# Patient Record
Sex: Male | Born: 1935 | Race: White | Hispanic: No | Marital: Married | Smoking: Former smoker
Health system: Southern US, Community
[De-identification: ages and names within clinical notes are randomized; demographics above are authoritative.]

## PROBLEM LIST (undated history)

## (undated) DIAGNOSIS — G8929 Other chronic pain: Secondary | ICD-10-CM

## (undated) DIAGNOSIS — M549 Dorsalgia, unspecified: Secondary | ICD-10-CM

## (undated) DIAGNOSIS — M545 Low back pain, unspecified: Secondary | ICD-10-CM

## (undated) DIAGNOSIS — Z87442 Personal history of urinary calculi: Secondary | ICD-10-CM

## (undated) DIAGNOSIS — Z86010 Personal history of colonic polyps: Secondary | ICD-10-CM

## (undated) DIAGNOSIS — M5136 Other intervertebral disc degeneration, lumbar region: Secondary | ICD-10-CM

## (undated) DIAGNOSIS — K219 Gastro-esophageal reflux disease without esophagitis: Secondary | ICD-10-CM

## (undated) DIAGNOSIS — N200 Calculus of kidney: Secondary | ICD-10-CM

## (undated) DIAGNOSIS — B1910 Unspecified viral hepatitis B without hepatic coma: Secondary | ICD-10-CM

## (undated) DIAGNOSIS — J189 Pneumonia, unspecified organism: Secondary | ICD-10-CM

## (undated) DIAGNOSIS — N390 Urinary tract infection, site not specified: Secondary | ICD-10-CM

## (undated) DIAGNOSIS — M2550 Pain in unspecified joint: Secondary | ICD-10-CM

## (undated) DIAGNOSIS — K8020 Calculus of gallbladder without cholecystitis without obstruction: Secondary | ICD-10-CM

## (undated) DIAGNOSIS — R197 Diarrhea, unspecified: Secondary | ICD-10-CM

## (undated) DIAGNOSIS — R32 Unspecified urinary incontinence: Secondary | ICD-10-CM

## (undated) DIAGNOSIS — C439 Malignant melanoma of skin, unspecified: Secondary | ICD-10-CM

## (undated) DIAGNOSIS — I739 Peripheral vascular disease, unspecified: Secondary | ICD-10-CM

## (undated) DIAGNOSIS — G4730 Sleep apnea, unspecified: Secondary | ICD-10-CM

## (undated) DIAGNOSIS — M2540 Effusion, unspecified joint: Secondary | ICD-10-CM

## (undated) DIAGNOSIS — N419 Inflammatory disease of prostate, unspecified: Secondary | ICD-10-CM

## (undated) DIAGNOSIS — K5792 Diverticulitis of intestine, part unspecified, without perforation or abscess without bleeding: Secondary | ICD-10-CM

## (undated) DIAGNOSIS — Z8619 Personal history of other infectious and parasitic diseases: Secondary | ICD-10-CM

## (undated) DIAGNOSIS — E785 Hyperlipidemia, unspecified: Secondary | ICD-10-CM

## (undated) DIAGNOSIS — G629 Polyneuropathy, unspecified: Secondary | ICD-10-CM

## (undated) DIAGNOSIS — I10 Essential (primary) hypertension: Secondary | ICD-10-CM

## (undated) DIAGNOSIS — H9319 Tinnitus, unspecified ear: Secondary | ICD-10-CM

## (undated) DIAGNOSIS — Z8719 Personal history of other diseases of the digestive system: Secondary | ICD-10-CM

## (undated) DIAGNOSIS — N400 Enlarged prostate without lower urinary tract symptoms: Secondary | ICD-10-CM

## (undated) DIAGNOSIS — M1990 Unspecified osteoarthritis, unspecified site: Secondary | ICD-10-CM

## (undated) DIAGNOSIS — N189 Chronic kidney disease, unspecified: Secondary | ICD-10-CM

## (undated) DIAGNOSIS — C4491 Basal cell carcinoma of skin, unspecified: Secondary | ICD-10-CM

## (undated) HISTORY — PX: OTHER SURGICAL HISTORY: SHX169

## (undated) HISTORY — DX: Calculus of gallbladder without cholecystitis without obstruction: K80.20

## (undated) HISTORY — DX: Sleep apnea, unspecified: G47.30

## (undated) HISTORY — DX: Unspecified viral hepatitis B without hepatic coma: B19.10

## (undated) HISTORY — DX: Low back pain, unspecified: M54.50

## (undated) HISTORY — DX: Unspecified urinary incontinence: R32

## (undated) HISTORY — DX: Low back pain: M54.5

## (undated) HISTORY — PX: BASAL CELL CARCINOMA EXCISION: SHX1214

## (undated) HISTORY — DX: Basal cell carcinoma of skin, unspecified: C44.91

## (undated) HISTORY — PX: EYE SURGERY: SHX253

## (undated) HISTORY — PX: WISDOM TOOTH EXTRACTION: SHX21

## (undated) HISTORY — DX: Personal history of other infectious and parasitic diseases: Z86.19

## (undated) HISTORY — DX: Calculus of kidney: N20.0

## (undated) HISTORY — DX: Urinary tract infection, site not specified: N39.0

---
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
## 1981-12-12 HISTORY — PX: VASECTOMY: SHX75

## 1984-12-12 HISTORY — PX: OTHER SURGICAL HISTORY: SHX169

## 1991-12-13 HISTORY — PX: CHOLECYSTECTOMY: SHX55

## 2002-04-25 ENCOUNTER — Ambulatory Visit (HOSPITAL_COMMUNITY): Admission: RE | Admit: 2002-04-25 | Discharge: 2002-04-25 | Payer: Self-pay | Admitting: Gastroenterology

## 2003-12-01 ENCOUNTER — Ambulatory Visit (HOSPITAL_BASED_OUTPATIENT_CLINIC_OR_DEPARTMENT_OTHER): Admission: RE | Admit: 2003-12-01 | Discharge: 2003-12-01 | Payer: Self-pay | Admitting: Urology

## 2003-12-01 ENCOUNTER — Observation Stay (HOSPITAL_COMMUNITY): Admission: EM | Admit: 2003-12-01 | Discharge: 2003-12-02 | Payer: Self-pay | Admitting: Urology

## 2003-12-01 ENCOUNTER — Encounter (INDEPENDENT_AMBULATORY_CARE_PROVIDER_SITE_OTHER): Payer: Self-pay | Admitting: Specialist

## 2004-12-12 HISTORY — PX: TRANSURETHRAL RESECTION OF PROSTATE: SHX73

## 2006-05-11 ENCOUNTER — Encounter: Admission: RE | Admit: 2006-05-11 | Discharge: 2006-05-11 | Payer: Self-pay | Admitting: Orthopedic Surgery

## 2009-10-12 HISTORY — PX: SHOULDER ARTHROSCOPY W/ ROTATOR CUFF REPAIR: SHX2400

## 2009-12-12 ENCOUNTER — Emergency Department (HOSPITAL_BASED_OUTPATIENT_CLINIC_OR_DEPARTMENT_OTHER)
Admission: EM | Admit: 2009-12-12 | Discharge: 2009-12-12 | Payer: Self-pay | Source: Home / Self Care | Admitting: Emergency Medicine

## 2009-12-12 HISTORY — PX: COLONOSCOPY: SHX174

## 2010-12-12 HISTORY — PX: OTHER SURGICAL HISTORY: SHX169

## 2011-04-29 NOTE — Op Note (Signed)
NAME:  Matthew Nash, Matthew Nash                          ACCOUNT NO.:  [id number]   MEDICAL RECORD NO.:  [id number]                   PATIENT TYPE:  AMB   LOCATION:  [facility]                                 FACILITY:  [facility]   PHYSICIAN:  Sigmund I. Patsi Sears, M.D.         DATE OF BIRTH:  [birth year]   DATE OF PROCEDURE:  12/01/2003  DATE OF DISCHARGE:                                 OPERATIVE REPORT   PREOPERATIVE DIAGNOSIS:  Benign prostatic hypertrophy.   POSTOPERATIVE DIAGNOSIS:  Benign prostatic hypertrophy.   OPERATION/PROCEDURE:  Transurethral resection of the prostate.   SURGEON:  Sigmund I. Patsi Sears, M.D.   ANESTHESIA:  General LMA.   PREPARATION:  After appropriate preanesthesia, the patient was brought to  the operating room and placed on the operating table in a dorsal supine  position where general LMA anesthesia was introduced.  He was then replaced  in the dorsal lithotomy position where his pubis was prepped with Betadine  solution and draped in the usual fashion.   REVIEW OF HISTORY:  This 75 year old male has crescendo BPH with a symptoms  course.  He had 22/7, despite doxazosin, and sol palmetto.  He is now for  TURP.   DESCRIPTION OF PROCEDURE:  Cystoscopy revealed trilobar BPH with an elevated  bladder neck.  There was no evidence of bladder stone or tumor, but there  was trabeculation and cellular formation.  There was no bladder stone or  tumor noted.   Resection was accomplished from the 5 o'clock to 7 o'clock positions, and  then from the 2 o'clock to the 5 o'clock positions, and from the 10 o'clock  back to the 6 o'clock position.  Chips were evacuated free from the bladder  without difficulty.  The patient tolerated the procedure well.  A size 24  catheter was placed in the bladder.  Because of large bloody resection, it  was elected to keep the patient for a 23-hour observation.  The patient was  awakened and taken to the recovery room in good  condition.                                               Sigmund I. Patsi Sears, M.D.    SIT/MEDQ  D:  12/01/2003  T:  12/01/2003  Job:  [id number]

## 2011-04-29 NOTE — Procedures (Signed)
[facility]  Patient:    Matthew Nash, Matthew Nash Visit Number: [id number] MRN: [id number]          Service Type: END Location: ENDO Attending Physician:  Charna Elizabeth Dictated by:   Anselmo Rod, M.D. Proc. Date: 04/25/02 Admit Date:  04/25/2002 Discharge Date: 04/25/2002                             Procedure Report  DATE OF BIRTH:  [birth year]  REFERRING PHYSICIAN:  PROCEDURE PERFORMED:  Colonoscopy.  ENDOSCOPIST:  Anselmo Rod, M.D.  INSTRUMENT USED:  Olympus video colonoscope.  INDICATIONS FOR PROCEDURE:  The patient is a 75 year old white male with a history of colonic polyps, rule out recurrent polyps.  PREPROCEDURE PREPARATION:  Informed consent was procured from the patient. The patient was fasted for eight hours prior to the procedure and prepped with a bottle of magnesium citrate and a gallon of NuLytely the night prior to the procedure.  PREPROCEDURE PHYSICAL:  The patient had stable vital signs.  Neck supple. Chest clear to auscultation.  S1, S2 regular.  Abdomen soft with normal bowel sounds.  DESCRIPTION OF PROCEDURE:  The patient was placed in the left lateral decubitus position and sedated with 100 mg of Demerol and 10 mg of Versed intravenously.  Once the patient was adequately sedated and maintained on low-flow oxygen and continuous cardiac monitoring, the Olympus video colonoscope was advanced from the rectum to the cecum without difficulty.  The patient had evidence of pandiverticular disease with more prominent changes in the left colon.  No masses or polyps were seen.  The procedure was complete up to the cecum.  The appendiceal orifice and ileocecal valve were clearly visualized and photographed.  IMPRESSION:  Pandiverticulosis, no masses or polyps seen.  RECOMMENDATIONS: 1. A high fiber diet has been discussed with the patient in great detail and    brochures have been given to him for his education. 2.  Repeat colorectal cancer screening is recommended in the next five years    unless the patient develops any abnormal symptoms in the interim. 3. Outpatient follow-up on a p.r.n. basis.Dictated by:   Anselmo Rod, M.D.  Attending Physician:  Charna Elizabeth DD:  05/05/02 TD:  05/07/02 Job: [id number]

## 2011-10-26 ENCOUNTER — Encounter (HOSPITAL_BASED_OUTPATIENT_CLINIC_OR_DEPARTMENT_OTHER): Payer: Self-pay | Admitting: *Deleted

## 2011-10-26 NOTE — Progress Notes (Signed)
NPO after MN. Pt to arrive at 0830. Needs istat and EKG. Will take prilosec am of surg. W/ sip of water. Reviewed RCC guidelines, will bring meds.

## 2011-10-28 ENCOUNTER — Encounter (HOSPITAL_BASED_OUTPATIENT_CLINIC_OR_DEPARTMENT_OTHER): Payer: Self-pay | Admitting: Anesthesiology

## 2011-10-28 ENCOUNTER — Ambulatory Visit (HOSPITAL_BASED_OUTPATIENT_CLINIC_OR_DEPARTMENT_OTHER)
Admission: RE | Admit: 2011-10-28 | Discharge: 2011-10-29 | Disposition: A | Discharge status: Home / Self Care | Payer: Medicare Other | Source: Ambulatory Visit | Attending: Orthopedic Surgery | Admitting: Orthopedic Surgery

## 2011-10-28 ENCOUNTER — Encounter (HOSPITAL_BASED_OUTPATIENT_CLINIC_OR_DEPARTMENT_OTHER)
Admission: RE | Disposition: A | Discharge status: Home / Self Care | Payer: Self-pay | Source: Ambulatory Visit | Attending: Orthopedic Surgery

## 2011-10-28 ENCOUNTER — Other Ambulatory Visit: Payer: Self-pay

## 2011-10-28 ENCOUNTER — Ambulatory Visit (HOSPITAL_BASED_OUTPATIENT_CLINIC_OR_DEPARTMENT_OTHER): Payer: Medicare Other | Admitting: Anesthesiology

## 2011-10-28 DIAGNOSIS — K219 Gastro-esophageal reflux disease without esophagitis: Secondary | ICD-10-CM | POA: Insufficient documentation

## 2011-10-28 DIAGNOSIS — N400 Enlarged prostate without lower urinary tract symptoms: Secondary | ICD-10-CM | POA: Insufficient documentation

## 2011-10-28 DIAGNOSIS — M75100 Unspecified rotator cuff tear or rupture of unspecified shoulder, not specified as traumatic: Secondary | ICD-10-CM

## 2011-10-28 DIAGNOSIS — X58XXXA Exposure to other specified factors, initial encounter: Secondary | ICD-10-CM | POA: Insufficient documentation

## 2011-10-28 DIAGNOSIS — M19019 Primary osteoarthritis, unspecified shoulder: Secondary | ICD-10-CM | POA: Insufficient documentation

## 2011-10-28 DIAGNOSIS — Z7982 Long term (current) use of aspirin: Secondary | ICD-10-CM | POA: Insufficient documentation

## 2011-10-28 DIAGNOSIS — I10 Essential (primary) hypertension: Secondary | ICD-10-CM | POA: Insufficient documentation

## 2011-10-28 DIAGNOSIS — S43429A Sprain of unspecified rotator cuff capsule, initial encounter: Secondary | ICD-10-CM | POA: Insufficient documentation

## 2011-10-28 DIAGNOSIS — Z79899 Other long term (current) drug therapy: Secondary | ICD-10-CM | POA: Insufficient documentation

## 2011-10-28 DIAGNOSIS — M24119 Other articular cartilage disorders, unspecified shoulder: Secondary | ICD-10-CM | POA: Insufficient documentation

## 2011-10-28 DIAGNOSIS — E119 Type 2 diabetes mellitus without complications: Secondary | ICD-10-CM | POA: Insufficient documentation

## 2011-10-28 DIAGNOSIS — M25819 Other specified joint disorders, unspecified shoulder: Secondary | ICD-10-CM | POA: Insufficient documentation

## 2011-10-28 HISTORY — DX: Benign prostatic hyperplasia without lower urinary tract symptoms: N40.0

## 2011-10-28 HISTORY — DX: Essential (primary) hypertension: I10

## 2011-10-28 HISTORY — DX: Gastro-esophageal reflux disease without esophagitis: K21.9

## 2011-10-28 HISTORY — DX: Unspecified osteoarthritis, unspecified site: M19.90

## 2011-10-28 LAB — POCT I-STAT 4, (NA,K, GLUC, HGB,HCT)
Glucose, Bld: 302 mg/dL — ABNORMAL HIGH (ref 70–99)
HCT: 49 % (ref 39.0–52.0)
Potassium: 4.2 mEq/L (ref 3.5–5.1)
Sodium: 142 mEq/L (ref 135–145)

## 2011-10-28 LAB — GLUCOSE, CAPILLARY: Glucose-Capillary: 278 mg/dL — ABNORMAL HIGH (ref 70–99)

## 2011-10-28 SURGERY — SHOULDER ARTHROSCOPY WITH OPEN ROTATOR CUFF REPAIR AND DISTAL CLAVICLE ACROMINECTOMY
Anesthesia: General | Site: Shoulder | Laterality: Right

## 2011-10-28 MED ORDER — ACETAMINOPHEN 650 MG RE SUPP: 650.0000 mg | Freq: Four times a day (QID) | RECTAL | Status: DC | PRN

## 2011-10-28 MED ORDER — METOCLOPRAMIDE HCL 5 MG/ML IJ SOLN: 5.0000 mg | Freq: Three times a day (TID) | INTRAMUSCULAR | Status: DC | PRN

## 2011-10-28 MED ORDER — PROMETHAZINE HCL 25 MG/ML IJ SOLN: 12.5000 mg | Freq: Once | INTRAMUSCULAR | Status: DC

## 2011-10-28 MED ORDER — ROPIVACAINE HCL 5 MG/ML IJ SOLN
INTRAMUSCULAR | Status: DC | PRN
  Administered 2011-10-28: 25 mL via EPIDURAL

## 2011-10-28 MED ORDER — SODIUM CHLORIDE 0.9 % IV SOLN: INTRAVENOUS | Status: DC

## 2011-10-28 MED ORDER — CELECOXIB 200 MG PO CAPS: 200.0000 mg | ORAL_CAPSULE | Freq: Two times a day (BID) | ORAL | Status: DC

## 2011-10-28 MED ORDER — DEXTROSE 5 % IV SOLN: 500.0000 mg | Freq: Four times a day (QID) | INTRAVENOUS | Status: DC | PRN

## 2011-10-28 MED ORDER — ACETAMINOPHEN 325 MG PO TABS: 650.0000 mg | ORAL_TABLET | Freq: Four times a day (QID) | ORAL | Status: DC | PRN

## 2011-10-28 MED ORDER — PROMETHAZINE HCL 25 MG/ML IJ SOLN: 6.2500 mg | INTRAMUSCULAR | Status: DC | PRN

## 2011-10-28 MED ORDER — VANCOMYCIN HCL IN DEXTROSE 1-5 GM/200ML-% IV SOLN: 1000.0000 mg | Freq: Two times a day (BID) | INTRAVENOUS | Status: DC

## 2011-10-28 MED ORDER — ONDANSETRON HCL 4 MG/2ML IJ SOLN
INTRAMUSCULAR | Status: DC | PRN
  Administered 2011-10-28: 4 mg via INTRAVENOUS

## 2011-10-28 MED ORDER — INSULIN ASPART 100 UNIT/ML ~~LOC~~ SOLN
0.0000 [IU] | SUBCUTANEOUS | Status: DC
  Administered 2011-10-28: 8 [IU] via SUBCUTANEOUS
  Administered 2011-10-28: 11 [IU] via SUBCUTANEOUS

## 2011-10-28 MED ORDER — SIMVASTATIN 10 MG PO TABS
10.0000 mg | ORAL_TABLET | Freq: Every day | ORAL | Status: DC
Start: 2011-10-28 — End: 2011-10-29

## 2011-10-28 MED ORDER — LACTATED RINGERS IV SOLN
INTRAVENOUS | Status: DC
  Administered 2011-10-28 (×2): via INTRAVENOUS

## 2011-10-28 MED ORDER — FENTANYL CITRATE 0.05 MG/ML IJ SOLN: 25.0000 ug | INTRAMUSCULAR | Status: DC | PRN

## 2011-10-28 MED ORDER — METOCLOPRAMIDE HCL 5 MG PO TABS: 5.0000 mg | ORAL_TABLET | Freq: Three times a day (TID) | ORAL | Status: DC | PRN

## 2011-10-28 MED ORDER — PANTOPRAZOLE SODIUM 20 MG PO TBEC: 20.0000 mg | DELAYED_RELEASE_TABLET | Freq: Every day | ORAL | Status: DC

## 2011-10-28 MED ORDER — MIDAZOLAM HCL 5 MG/5ML IJ SOLN
INTRAMUSCULAR | Status: DC | PRN
  Administered 2011-10-28: 1 mg via INTRAVENOUS

## 2011-10-28 MED ORDER — PROMETHAZINE HCL 25 MG PO TABS: 25.0000 mg | ORAL_TABLET | Freq: Four times a day (QID) | ORAL | Status: DC | PRN

## 2011-10-28 MED ORDER — OXYCODONE-ACETAMINOPHEN 5-325 MG PO TABS
1.0000 | ORAL_TABLET | ORAL | Status: DC | PRN
  Administered 2011-10-28: 2 via ORAL

## 2011-10-28 MED ORDER — ASPIRIN 81 MG PO TABS: 81.0000 mg | ORAL_TABLET | Freq: Every day | ORAL | Status: DC

## 2011-10-28 MED ORDER — PHENOL 1.4 % MT LIQD: 1.0000 | OROMUCOSAL | Status: DC | PRN

## 2011-10-28 MED ORDER — ONDANSETRON HCL 4 MG PO TABS: 4.0000 mg | ORAL_TABLET | Freq: Four times a day (QID) | ORAL | Status: DC | PRN

## 2011-10-28 MED ORDER — SUCCINYLCHOLINE CHLORIDE 20 MG/ML IJ SOLN
INTRAMUSCULAR | Status: DC | PRN
  Administered 2011-10-28: 140 mg via INTRAVENOUS

## 2011-10-28 MED ORDER — SODIUM CHLORIDE 0.9 % IR SOLN
Status: DC | PRN
  Administered 2011-10-28: 10:00:00

## 2011-10-28 MED ORDER — POVIDONE-IODINE 7.5 % EX SOLN: Freq: Once | CUTANEOUS | Status: DC

## 2011-10-28 MED ORDER — MENTHOL 3 MG MT LOZG: 1.0000 | LOZENGE | OROMUCOSAL | Status: DC | PRN

## 2011-10-28 MED ORDER — MIDAZOLAM HCL 2 MG/2ML IJ SOLN
2.0000 mg | Freq: Once | INTRAMUSCULAR | Status: AC
  Administered 2011-10-28: 2 mg via INTRAVENOUS

## 2011-10-28 MED ORDER — LIDOCAINE HCL (CARDIAC) 20 MG/ML IV SOLN
INTRAVENOUS | Status: DC | PRN
  Administered 2011-10-28: 50 mg via INTRAVENOUS

## 2011-10-28 MED ORDER — PROPOFOL 10 MG/ML IV EMUL
INTRAVENOUS | Status: DC | PRN
  Administered 2011-10-28: 200 mg via INTRAVENOUS

## 2011-10-28 MED ORDER — DOCUSATE SODIUM 100 MG PO CAPS: 100.0000 mg | ORAL_CAPSULE | Freq: Two times a day (BID) | ORAL | Status: DC

## 2011-10-28 MED ORDER — METFORMIN HCL 500 MG PO TABS: 500.0000 mg | ORAL_TABLET | Freq: Two times a day (BID) | ORAL | Status: DC

## 2011-10-28 MED ORDER — BUPIVACAINE-EPINEPHRINE 0.5% -1:200000 IJ SOLN
INTRAMUSCULAR | Status: DC | PRN
  Administered 2011-10-28: 5 mL

## 2011-10-28 MED ORDER — FENTANYL CITRATE 0.05 MG/ML IJ SOLN
INTRAMUSCULAR | Status: DC | PRN
  Administered 2011-10-28: 25 ug via INTRAVENOUS
  Administered 2011-10-28: 100 ug via INTRAVENOUS
  Administered 2011-10-28 (×3): 25 ug via INTRAVENOUS

## 2011-10-28 MED ORDER — METHOCARBAMOL 500 MG PO TABS
500.0000 mg | ORAL_TABLET | Freq: Four times a day (QID) | ORAL | Status: DC | PRN
  Administered 2011-10-28: 500 mg via ORAL

## 2011-10-28 MED ORDER — VANCOMYCIN HCL IN DEXTROSE 1-5 GM/200ML-% IV SOLN
1000.0000 mg | Freq: Two times a day (BID) | INTRAVENOUS | Status: DC
  Administered 2011-10-28: 1000 mg via INTRAVENOUS

## 2011-10-28 MED ORDER — LISINOPRIL 10 MG PO TABS: 10.0000 mg | ORAL_TABLET | Freq: Every evening | ORAL | Status: DC

## 2011-10-28 MED ORDER — ONDANSETRON HCL 4 MG/2ML IJ SOLN: 4.0000 mg | Freq: Four times a day (QID) | INTRAMUSCULAR | Status: DC | PRN

## 2011-10-28 MED ORDER — CEFAZOLIN SODIUM-DEXTROSE 2-3 GM-% IV SOLR: 2.0000 g | Freq: Once | INTRAVENOUS | Status: DC

## 2011-10-28 MED ORDER — CALCIUM CARBONATE-VITAMIN D 250-125 MG-UNIT PO TABS: 1.0000 | ORAL_TABLET | Freq: Two times a day (BID) | ORAL | Status: DC

## 2011-10-28 SURGICAL SUPPLY — 70 items
ANCH SUT 2 5.5 BABSR ASCP (Orthopedic Implant) ×1 IMPLANT
ANCHOR PEEK ZIP 5.5 NDL NO2 (Orthopedic Implant) ×1 IMPLANT
APL SKNCLS STERI-STRIP NONHPOA (GAUZE/BANDAGES/DRESSINGS) ×1
BENZOIN TINCTURE PRP APPL 2/3 (GAUZE/BANDAGES/DRESSINGS) ×2 IMPLANT
BLADE 4.2CUDA (BLADE) ×2 IMPLANT
BLADE CUDA 4.2 (BLADE) IMPLANT
BLADE CUDA 5.5 (BLADE) IMPLANT
BLADE CUDA SHAVER 3.5 (BLADE) IMPLANT
BLADE CUTTER GATOR 3.5 (BLADE) IMPLANT
BLADE FLAT COURSE (BLADE) IMPLANT
BLADE GREAT WHITE 4.2 (BLADE) IMPLANT
BUR OVAL 4.0 (BURR) ×1 IMPLANT
CANISTER SUCT LVC 12 LTR MEDI- (MISCELLANEOUS) ×3 IMPLANT
CANISTER SUCTION 1200CC (MISCELLANEOUS) ×3 IMPLANT
CANISTER SUCTION 2500CC (MISCELLANEOUS) ×2 IMPLANT
CLOTH BEACON ORANGE TIMEOUT ST (SAFETY) ×2 IMPLANT
DRAPE LG THREE QUARTER DISP (DRAPES) ×3 IMPLANT
DRAPE SHOULDER BEACH CHAIR (DRAPES) ×2 IMPLANT
DRAPE U-SHAPE 47X51 STRL (DRAPES) ×2 IMPLANT
DRSG ADAPTIC 3X8 NADH LF (GAUZE/BANDAGES/DRESSINGS) ×2 IMPLANT
DRSG PAD ABDOMINAL 8X10 ST (GAUZE/BANDAGES/DRESSINGS) ×2 IMPLANT
DURAPREP 26ML APPLICATOR (WOUND CARE) ×2 IMPLANT
ELECT MENISCUS 165MM 90D (ELECTRODE) IMPLANT
ELECT REM PT RETURN 9FT ADLT (ELECTROSURGICAL) ×2
ELECTRODE REM PT RTRN 9FT ADLT (ELECTROSURGICAL) ×1 IMPLANT
GAUZE SPONGE 4X4 12PLY STRL LF (GAUZE/BANDAGES/DRESSINGS) ×1 IMPLANT
GLOVE ECLIPSE 6.5 STRL STRAW (GLOVE) ×1 IMPLANT
GLOVE ECLIPSE 8.0 STRL XLNG CF (GLOVE) ×4 IMPLANT
GLOVE INDICATOR 6.5 STRL GRN (GLOVE) ×1 IMPLANT
GLOVE INDICATOR 8.0 STRL GRN (GLOVE) ×3 IMPLANT
GOWN PREVENTION PLUS LG XLONG (DISPOSABLE) ×2 IMPLANT
GOWN STRL REIN XL XLG (GOWN DISPOSABLE) ×4 IMPLANT
IV NS IRRIG 3000ML ARTHROMATIC (IV SOLUTION) ×2 IMPLANT
NDL 1/2 CIR CATGUT .05X1.09 (NEEDLE) IMPLANT
NDL HYPO 18GX1.5 BLUNT FILL (NEEDLE) ×1 IMPLANT
NDL SAFETY ECLIPSE 18X1.5 (NEEDLE) ×1 IMPLANT
NEEDLE 1/2 CIR CATGUT .05X1.09 (NEEDLE) IMPLANT
NEEDLE HYPO 18GX1.5 BLUNT FILL (NEEDLE) ×2 IMPLANT
NEEDLE HYPO 18GX1.5 SHARP (NEEDLE) ×2
NS IRRIG 500ML POUR BTL (IV SOLUTION) ×2 IMPLANT
PACK ARTHROSCOPY DSU (CUSTOM PROCEDURE TRAY) ×2 IMPLANT
PACK BASIN DAY SURGERY FS (CUSTOM PROCEDURE TRAY) ×2 IMPLANT
PENCIL BUTTON HOLSTER BLD 10FT (ELECTRODE) ×1 IMPLANT
SET ARTHROSCOPY TUBING (MISCELLANEOUS) ×2
SET ARTHROSCOPY TUBING LN (MISCELLANEOUS) ×1 IMPLANT
SLING ARM IMMOBILIZER LRG (SOFTGOODS) IMPLANT
SLING ARM IMMOBILIZER XL (CAST SUPPLIES) ×1 IMPLANT
SPONGE GAUZE 4X4 12PLY (GAUZE/BANDAGES/DRESSINGS) ×2 IMPLANT
SPONGE LAP 4X18 X RAY DECT (DISPOSABLE) ×1 IMPLANT
SPONGE SURGIFOAM ABS GEL 100 (HEMOSTASIS) ×1 IMPLANT
STAPLER VISISTAT 35W (STAPLE) ×1 IMPLANT
STRIP CLOSURE SKIN 1/2X4 (GAUZE/BANDAGES/DRESSINGS) ×2 IMPLANT
SUCTION FRAZIER TIP 10 FR DISP (SUCTIONS) ×2 IMPLANT
SUT BONE WAX W31G (SUTURE) ×1 IMPLANT
SUT ETHIBOND GREEN BRAID 0S 4 (SUTURE) IMPLANT
SUT ETHIBOND NAB CT1 #1 30IN (SUTURE) IMPLANT
SUT ETHILON 4 0 PS 2 18 (SUTURE) ×3 IMPLANT
SUT VIC AB 0 CT1 36 (SUTURE) IMPLANT
SUT VIC AB 1 CT1 36 (SUTURE) ×4 IMPLANT
SUT VIC AB 2-0 CT1 27 (SUTURE) ×4
SUT VIC AB 2-0 CT1 TAPERPNT 27 (SUTURE) ×2 IMPLANT
SUT VIC AB 3-0 SH 27 (SUTURE)
SUT VIC AB 3-0 SH 27X BRD (SUTURE) IMPLANT
SYR BULB IRRIGATION 50ML (SYRINGE) ×2 IMPLANT
SYRINGE 10CC LL (SYRINGE) ×2 IMPLANT
TAPE HYPAFIX 6X30 (GAUZE/BANDAGES/DRESSINGS) ×1 IMPLANT
TOWEL OR 17X24 6PK STRL BLUE (TOWEL DISPOSABLE) ×3 IMPLANT
TUBE CONNECTING 12X1/4 (SUCTIONS) ×2 IMPLANT
WAND 90 DEG TURBOVAC W/CORD (SURGICAL WAND) ×2 IMPLANT
WATER STERILE IRR 500ML POUR (IV SOLUTION) ×2 IMPLANT

## 2011-10-28 NOTE — H&P (Signed)
Matthew Nash is an 75 y.o. male.   Chief Complaint: painful rt shoulder HPI:   Past Medical History  Diagnosis Date  . BPH (benign prostatic hyperplasia)     hx s/p turp  . Hypertension     stress test 10 yrs ago  . Diabetes mellitus     oral meds  . Arthritis     left knee and right shoulder  . Right rotator cuff tear   . GERD (gastroesophageal reflux disease)     controlled w/ prilosec    Past Surgical History  Procedure Date  . Cholecystectomy 1993  . T7 - l1  bone chip removed 1979  . Shoulder arthroscopy w/ rotator cuff repair NOV 2010    LEFT  . Tonsillectomy AGE 39  . Transurethral resection of prostate 2004    History reviewed. No pertinent family history. Social History:  reports that he quit smoking about 33 years ago. He has never used smokeless tobacco. He reports that he drinks alcohol. He reports that he does not use illicit drugs.  Allergies:  Allergies  Allergen Reactions  . Penicillins Rash    Medications Prior to Admission  Medication Dose Route Frequency Provider Last Rate Last Dose  . 1 mL EPINEPHrine 1 mg/mL (1:1000) in 0.9% Normal Saline 3000 mL irrigation    PRN James P Aplington      . bupivacaine-EPINEPHrine (MARCAINE W/ EPI) 0.5 % (with pres) injection    PRN James P Aplington   20 mL at 10/28/11 1009  . fentaNYL (SUBLIMAZE) injection 25-50 mcg  25-50 mcg Intravenous Q5 min PRN Jill Side, MD      . insulin aspart (novoLOG) injection 0-15 Units  0-15 Units Subcutaneous Q4H Jill Side, MD      . lactated ringers infusion   Intravenous Continuous Jill Side, MD 50 mL/hr at 10/28/11 1014    . midazolam (VERSED) injection 2 mg  2 mg Intravenous Once Jill Side, MD   2 mg at 10/28/11 1024  . povidone-iodine (BETADINE) 7.5 % scrub   Topical Once James P Aplington      . promethazine (PHENERGAN) injection 6.25-12.5 mg  6.25-12.5 mg Intravenous Q15 min PRN Jill Side, MD      . vancomycin (VANCOCIN) IVPB 1000  mg/200 mL premix  1,000 mg Intravenous Q12H James P Aplington      . DISCONTD: ceFAZolin (ANCEF) IVPB 2 g/50 mL premix  2 g Intravenous Once James P Aplington       Medications Prior to Admission  Medication Sig Dispense Refill  . aspirin 81 MG tablet Take 81 mg by mouth daily.        . calcium-vitamin D (OSCAL WITH D) 250-125 MG-UNIT per tablet Take 1 tablet by mouth 2 (two) times daily.        . celecoxib (CELEBREX) 200 MG capsule Take 200 mg by mouth.        . FIBER FORMULA PO Take 930 mg by mouth 2 (two) times daily. x3 capsules bid       . lisinopril (PRINIVIL,ZESTRIL) 10 MG tablet Take 10 mg by mouth every evening.        . metFORMIN (GLUCOPHAGE) 500 MG tablet Take 500 mg by mouth 2 (two) times daily with a meal.        . Multiple Vitamin (MULTIVITAMIN) tablet Take 1 tablet by mouth daily.        [name] Kitchen omeprazole (PRILOSEC) 20 MG capsule Take 20 mg by  mouth every morning.        . Probiotic Product (PROBIOTIC FORMULA PO) Take 300 mg by mouth every evening.        . simvastatin (ZOCOR) 10 MG tablet Take 10 mg by mouth at bedtime.        . vitamin C (ASCORBIC ACID) 500 MG tablet Take 500 mg by mouth daily.          Results for orders placed during the hospital encounter of 10/28/11 (from the past 48 hour(s))  POCT I-STAT 4, (NA,K, GLUC, HGB,HCT)     Status: Abnormal   Collection Time   10/28/11 10:09 AM      Component Value Range Comment   Sodium 142  135 - 145 (mEq/L)    Potassium 4.2  3.5 - 5.1 (mEq/L)    Glucose, Bld 302 (*) 70 - 99 (mg/dL)    HCT 11.9  14.7 - 82.9 (%)    Hemoglobin 16.7  13.0 - 17.0 (g/dL)    No results found.  ROS  Blood pressure 131/80, pulse 77, temperature 97.3 F (36.3 C), temperature source Oral, resp. rate 20, height 5\' 11"  (1.803 m), weight 114.76 kg (253 lb), SpO2 94.00%. Physical Exam  Constitutional: He is oriented to person, place, and time. He appears well-developed.  HENT:  Head: Normocephalic.  Right Ear: External ear normal.  Eyes:  Conjunctivae and EOM are normal. Pupils are equal, round, and reactive to light.  Neck: Normal range of motion. Neck supple.  Cardiovascular: Normal rate, regular rhythm, normal heart sounds and intact distal pulses.   Respiratory: Effort normal and breath sounds normal.  GI: Soft. Bowel sounds are normal.  Musculoskeletal:       He has had an interscalene block  Neurological: He is alert and oriented to person, place, and time.  Skin: Skin is dry.  Psychiatric: He has a normal mood and affect. His behavior is normal. Judgment and thought content normal.     Assessment/Plan Rt shoulder--labral tear,rotator cuff tear,acarthitis  Rt shoulder arthroscopy with labral debridement, open distal clavicle resection, rotator cuff repair  APLINGTON,JAMES P 10/28/2011, 10:30 AM

## 2011-10-28 NOTE — Anesthesia Postprocedure Evaluation (Signed)
  Anesthesia Post-op Note  Patient: Matthew Nash  Procedure(s) Performed:  SHOULDER ARTHROSCOPY WITH OPEN ROTATOR CUFF REPAIR AND DISTAL CLAVICLE ACROMINECTOMY - RIGHT SHOULDER ARTHROSCOPY WITH LABRAL DEBRIDEMENT AND OPEN ANTERIOR ACROMINECTOMY WITH ROTATOR CUFF REPAIR OWER DIABETIC GENERAL ANESTHESIA WITH BLOCK ALLEN OR SCHENE FRAME  Patient Location: PACU  Anesthesia Type: General and GA combined with regional for post-op pain  Level of Consciousness: oriented and sedated  Airway and Oxygen Therapy: Patient Spontanous Breathing and Patient connected to nasal cannula oxygen  Post-op Pain: none  Post-op Assessment: Post-op Vital signs reviewed, Patient's Cardiovascular Status Stable, Respiratory Function Stable and Patent Airway  Post-op Vital Signs: stable  Complications: No apparent anesthesia complications

## 2011-10-28 NOTE — Anesthesia Procedure Notes (Addendum)
Anesthesia Regional Block:  Interscalene brachial plexus block  Pre-Anesthetic Checklist: ,, timeout performed, Correct Patient, Correct Site, Correct Laterality, Correct Procedure, Correct Position, site marked, Risks and benefits discussed,  Surgical consent,  Pre-op evaluation,  At surgeon's request and post-op pain management  Laterality: Right  Prep: Betadine       Needles:  Injection technique: Single-shot  Needle Type: Stimulator Needle - 40      Needle Gauge: 22 and 22 G  Needle insertion depth: 3 cm   Additional Needles:  Procedures: nerve stimulator Interscalene brachial plexus block  Nerve Stimulator or Paresthesia:  Response: 0.5 mA,   Additional Responses:   Narrative:  Start time: 10/28/2011 10:20 AM End time: 10/28/2011 10:30 AM  Performed by: Personally  Anesthesiologist: Shireen Quan  Additional Notes: Patient tolerated the procedure well without complications  Interscalene brachial plexus block Performed by: Zenia Resides D    Procedure Name: Intubation Date/Time: 10/28/2011 10:50 AM Performed by: Zenia Resides D Pre-anesthesia Checklist: Patient identified, Emergency Drugs available, Suction available and Patient being monitored Patient Re-evaluated:Patient Re-evaluated prior to inductionOxygen Delivery Method: Circle System Utilized Preoxygenation: Pre-oxygenation with 100% oxygen Intubation Type: IV induction and Circoid Pressure applied Ventilation: Mask ventilation without difficulty and Oral airway inserted - appropriate to patient size Laryngoscope Size: Mac and 3 Grade View: Grade I Tube type: Oral Number of attempts: 1 Airway Equipment and Method: stylet and oral airway Placement Confirmation: ETT inserted through vocal cords under direct vision,  positive ETCO2 and breath sounds checked- equal and bilateral Secured at: 23 cm Tube secured with: Tape Dental Injury: Teeth and Oropharynx as per pre-operative assessment

## 2011-10-28 NOTE — Anesthesia Preprocedure Evaluation (Addendum)
Anesthesia Evaluation  Patient identified by MRN, date of birth, ID band Patient awake    Reviewed: Allergy & Precautions, H&P , NPO status , Patient's Chart, lab work & pertinent test results, reviewed documented beta blocker date and time   Airway Mallampati: II  Neck ROM: Full    Dental   Pulmonary neg pulmonary ROS,  clear to auscultation        Cardiovascular hypertension, Pt. on medications Regular Normal Denies cardiac symptoms   Neuro/Psych Negative Neurological ROS  Negative Psych ROS   GI/Hepatic negative GI ROS, Neg liver ROS,   Endo/Other  Diabetes mellitus-, Type 2, Oral Hypoglycemic Agents  Renal/GU negative Renal ROS  Genitourinary negative   Musculoskeletal negative musculoskeletal ROS (+)   Abdominal   Peds negative pediatric ROS (+)  Hematology negative hematology ROS (+)   Anesthesia Other Findings   Reproductive/Obstetrics negative OB ROS                           Anesthesia Physical Anesthesia Plan  ASA: III  Anesthesia Plan: General   Post-op Pain Management:    Induction: Intravenous  Airway Management Planned: Oral ETT  Additional Equipment:   Intra-op Plan:   Post-operative Plan: Extubation in OR  Informed Consent: I have reviewed the patients History and Physical, chart, labs and discussed the procedure including the risks, benefits and alternatives for the proposed anesthesia with the patient or authorized representative who has indicated his/her understanding and acceptance.     Plan Discussed with: CRNA and Surgeon  Anesthesia Plan Comments:         Anesthesia Quick Evaluation

## 2011-10-28 NOTE — Transfer of Care (Signed)
Immediate Anesthesia Transfer of Care Note  Patient: Matthew Nash  Procedure(s) Performed:  SHOULDER ARTHROSCOPY WITH OPEN ROTATOR CUFF REPAIR AND DISTAL CLAVICLE ACROMINECTOMY - RIGHT SHOULDER ARTHROSCOPY WITH LABRAL DEBRIDEMENT AND OPEN ANTERIOR ACROMINECTOMY WITH ROTATOR CUFF REPAIR OWER DIABETIC GENERAL ANESTHESIA WITH BLOCK ALLEN OR SCHENE FRAME  Patient Location: PACU  Anesthesia Type: General  Level of Consciousness: awake and oriented  Airway & Oxygen Therapy: Patient Spontanous Breathing and Patient connected to face mask oxygen  Post-op Assessment: Report given to PACU RN  Post vital signs: stable  Complications: No apparent anesthesia complications

## 2011-10-28 NOTE — Brief Op Note (Signed)
10/28/2011  12:41 PM  PATIENT:  Matthew Matthew  75 y.o. male  PRE-OPERATIVE DIAGNOSIS:  RIGHT SHOULDER LABRAL TEAR AND ROTATOR CUFF TEAR  POST-OPERATIVE DIAGNOSIS:  RIGHT SHOULDER LABRAL TEAR AND ROTATOR CUFF  PROCEDURE:  Procedure(s): SHOULDER ARTHROSCOPY WITH OPEN ROTATOR CUFF REPAIR AND DISTAL CLAVICLE ACROMINECTOMY  SURGEON:  Surgeon(s): James P Aplington  PHYSICIAN ASSISTANT:   ASSISTANTS:Mr. Underwood PAC   ANESTHESIA:   regional and general  EBL:  Total I/O In: 1000 [I.V.:1000] Out: -   BLOOD ADMINISTERED:none  DRAINS: none   LOCAL MEDICATIONS USED:  MARCAINE 5 CC  SPECIMEN:  No Specimen  DISPOSITION OF SPECIMEN:  N/A  COUNTS:  YES  TOURNIQUET:  * No tourniquets in log *  DICTATION: .Other Dictation: Dictation Number [id number]  PLAN OF CARE: Admit for overnight observation  PATIENT DISPOSITION:  PACU - hemodynamically stable.   Delay start of Pharmacological VTE agent (>24hrs) due to surgical blood loss or risk of bleeding:  {YES/NO/NOT APPLICABLE:20182

## 2011-10-28 NOTE — Op Note (Signed)
Matthew Nash, BAIL NO.:  [id number]  MEDICAL RECORD NO.:  [id number]  LOCATION:                                 FACILITY:  PHYSICIAN:  Marlowe Kays, M.D.  DATE OF BIRTH:  11/10/1936  DATE OF PROCEDURE:  10/28/2011 DATE OF DISCHARGE:                              OPERATIVE REPORT   PREOPERATIVE DIAGNOSES: 1. Torn labrum. 2. Acromioclavicular joint arthritis. 3. Torn rotator cuff, right shoulder.  POSTOPERATIVE DIAGNOSES: 1. Torn labrum. 2. Acromioclavicular joint arthritis. 3. Torn rotator cuff, right shoulder.  OPERATION: 1. Right shoulder arthroscopy with debridement of labrum. 2. Open distal clavicle resection. 3. Open anterior acromionectomy with repair of torn rotator cuff.  SURGEON:  Marlowe Kays, M.D.  ASSISTANTDruscilla Brownie. Cherlynn June.  ANESTHESIA:  General preceded by interscalene block.  Mr. Angie Fava assistance was required to help hold the arm, move the arm, assist in retraction, and generally give the extra pair of hands needed to perform the case.  PROCEDURE:  Prophylactic antibiotics, satisfactory general anesthesia preceded by an interscalene block, beach chair position on the sliding frame, right shoulder girdle was prepped with DuraPrep, draped in sterile field.  The anatomy of the shoulder joint was marked out and I infiltrated the site for posterior portal and the surgical incision with Marcaine with adrenaline.  First through a posterior soft spot portal after time-out was performed, I easily entered the glenohumeral joint with the findings of the badly torn labrum.  There was some minor wear of the humeral head, also undersurface of the rotator cuff was visualized with tear fragments as well.  I advanced the arthroscope between the biceps tendon and subscapularis and using a switching stick, made an anterior incision over which I placed a metal cannula followed by 4.2 shaver entering the joint.  I debrided down  the labrum until clear.  Final pictures were taken.  I then removed fluid from the joint and made incision over the distal clavicle identifying the [facility] joint.  I marked off a centimeter and a half medial to the [facility] joint and undermined the clavicle at this point placing baby Bennett retractors.  I then used a micro saw to amputate the clavicle at this level.  I checked, there were no residual bone fragments.  The raw bone was covered with bone wax.  I then extended the incision lateralward and dissected fascia off the anterior acromion.  I placed a small Cobb elevator followed by large Cobb elevator beneath the anterior acromion.  He had a tight impingement problem.  My initial anterior acromionectomy was performed, and I followed this by removing additional bone with both saw and rongeur until we had a wide decompression.  I then inspected the rotator cuff tear.  Biceps tendon was intact.  Tear measured about 2.5 cm from front to back and about 2.5 to 3 cm from lateral to medial.  It was somewhat comminuted.  After denuding the articular cartilage of the humeral head next to the greater tuberosity, I used a 4 stranded 5.5 Stryker anchor and wove the 4 limbs through the rotator cuff.  Working first posteriorly, I tied down the posterior half of the  tear and then using the suture, made multiple side-to-side sutures repairing the comminution.  Then anteriorly, I tied down the anterior portion of the rotator cuff and also made a second line of suturing by taking the suture through the terminal portion of the rotator cuff into the soft tissue adjacent to the humerus.  Then in the mid and posterior portions, I also solidified the repair.  This gave a nice stable repair with his arm to his side.  The wound was then irrigated with sterile saline.  I placed Gelfoam in the distal clavicle resection site and began closure with multiple interrupted #1 Vicryl in the anterior and posterior fascia.  At  the level of the anterior acromion, I found the bony fragment, but I feel at this point had not been able to visualize it because it was concealed by soft tissue.  The fragment was removed and the deep portion of the wound closed with interrupted #1 Vicryl and subcutaneous tissue with the same staples in the skin except for the 2 portals that were closed with 4-0 Nylon.  Betadine, Adaptic, dry sterile dressing, shoulder immobilizer applied.  He tolerated the procedure well, was taken to recovery room in satisfied condition with no known complications.          ______________________________ Marlowe Kays, M.D.     JA/MEDQ  D:  10/28/2011  T:  10/28/2011  Job:  [id number]

## 2011-10-29 MED ORDER — METHOCARBAMOL 500 MG PO TABS: 500.0000 mg | ORAL_TABLET | Freq: Four times a day (QID) | ORAL | Status: AC

## 2011-10-29 MED ORDER — OXYCODONE-ACETAMINOPHEN 7.5-325 MG PO TABS: 1.0000 | ORAL_TABLET | ORAL | Status: AC | PRN

## 2011-10-29 NOTE — Progress Notes (Signed)
Matthew Nash 75 y.o. 10/28/2011   Lab. Results:  Basename 10/28/11 1009  WBC --  HGB 16.7  HCT 49.0  PLT --   BMET Lab Results  Component Value Date   NA 142 10/28/2011   K 4.2 10/28/2011   GLUCOSE 302* 10/28/2011    Basename 10/28/11 1009  CREATININE --  BUN --  NA 142  K 4.2  CL --  CO2 --  GLUCOSE 302*    No results found for this basename: inr   VITALS Filed Vitals:   10/29/11 0754  BP: 151/86  Pulse: 72  Temp: 97.2 F (36.2 C)  Resp: 18     I have reviewed his lab and hisvitals.  Wound Check: normalNV intact  APLINGTON,JAMES P 10/29/2011

## 2012-01-19 DIAGNOSIS — M75120 Complete rotator cuff tear or rupture of unspecified shoulder, not specified as traumatic: Secondary | ICD-10-CM | POA: Diagnosis not present

## 2012-01-24 DIAGNOSIS — M19019 Primary osteoarthritis, unspecified shoulder: Secondary | ICD-10-CM | POA: Diagnosis not present

## 2012-01-26 DIAGNOSIS — M19019 Primary osteoarthritis, unspecified shoulder: Secondary | ICD-10-CM | POA: Diagnosis not present

## 2012-01-30 DIAGNOSIS — M19019 Primary osteoarthritis, unspecified shoulder: Secondary | ICD-10-CM | POA: Diagnosis not present

## 2012-02-02 DIAGNOSIS — S43429A Sprain of unspecified rotator cuff capsule, initial encounter: Secondary | ICD-10-CM | POA: Diagnosis not present

## 2012-02-08 DIAGNOSIS — E785 Hyperlipidemia, unspecified: Secondary | ICD-10-CM | POA: Diagnosis not present

## 2012-02-10 DIAGNOSIS — E785 Hyperlipidemia, unspecified: Secondary | ICD-10-CM | POA: Diagnosis not present

## 2012-02-10 DIAGNOSIS — E291 Testicular hypofunction: Secondary | ICD-10-CM | POA: Diagnosis not present

## 2012-02-10 DIAGNOSIS — F528 Other sexual dysfunction not due to a substance or known physiological condition: Secondary | ICD-10-CM | POA: Diagnosis not present

## 2012-02-10 DIAGNOSIS — M75120 Complete rotator cuff tear or rupture of unspecified shoulder, not specified as traumatic: Secondary | ICD-10-CM | POA: Diagnosis not present

## 2012-02-13 DIAGNOSIS — N400 Enlarged prostate without lower urinary tract symptoms: Secondary | ICD-10-CM | POA: Diagnosis not present

## 2012-02-13 DIAGNOSIS — M75120 Complete rotator cuff tear or rupture of unspecified shoulder, not specified as traumatic: Secondary | ICD-10-CM | POA: Diagnosis not present

## 2012-02-16 DIAGNOSIS — M75120 Complete rotator cuff tear or rupture of unspecified shoulder, not specified as traumatic: Secondary | ICD-10-CM | POA: Diagnosis not present

## 2012-02-23 DIAGNOSIS — M75120 Complete rotator cuff tear or rupture of unspecified shoulder, not specified as traumatic: Secondary | ICD-10-CM | POA: Diagnosis not present

## 2012-03-01 DIAGNOSIS — M75120 Complete rotator cuff tear or rupture of unspecified shoulder, not specified as traumatic: Secondary | ICD-10-CM | POA: Diagnosis not present

## 2012-03-29 DIAGNOSIS — M75120 Complete rotator cuff tear or rupture of unspecified shoulder, not specified as traumatic: Secondary | ICD-10-CM | POA: Diagnosis not present

## 2012-05-14 DIAGNOSIS — E119 Type 2 diabetes mellitus without complications: Secondary | ICD-10-CM | POA: Diagnosis not present

## 2012-05-17 DIAGNOSIS — J309 Allergic rhinitis, unspecified: Secondary | ICD-10-CM | POA: Diagnosis not present

## 2012-05-17 DIAGNOSIS — N529 Male erectile dysfunction, unspecified: Secondary | ICD-10-CM | POA: Diagnosis not present

## 2012-05-17 DIAGNOSIS — R42 Dizziness and giddiness: Secondary | ICD-10-CM | POA: Diagnosis not present

## 2012-05-17 DIAGNOSIS — E785 Hyperlipidemia, unspecified: Secondary | ICD-10-CM | POA: Diagnosis not present

## 2012-08-30 DIAGNOSIS — Z23 Encounter for immunization: Secondary | ICD-10-CM | POA: Diagnosis not present

## 2012-11-19 DIAGNOSIS — S99929A Unspecified injury of unspecified foot, initial encounter: Secondary | ICD-10-CM | POA: Diagnosis not present

## 2012-11-19 DIAGNOSIS — S8990XA Unspecified injury of unspecified lower leg, initial encounter: Secondary | ICD-10-CM | POA: Diagnosis not present

## 2012-11-19 DIAGNOSIS — M25529 Pain in unspecified elbow: Secondary | ICD-10-CM | POA: Diagnosis not present

## 2012-11-19 DIAGNOSIS — M7020 Olecranon bursitis, unspecified elbow: Secondary | ICD-10-CM | POA: Diagnosis not present

## 2012-11-22 DIAGNOSIS — IMO0002 Reserved for concepts with insufficient information to code with codable children: Secondary | ICD-10-CM | POA: Diagnosis not present

## 2012-12-20 DIAGNOSIS — M62838 Other muscle spasm: Secondary | ICD-10-CM | POA: Diagnosis not present

## 2012-12-20 DIAGNOSIS — E7800 Pure hypercholesterolemia, unspecified: Secondary | ICD-10-CM | POA: Diagnosis not present

## 2012-12-20 DIAGNOSIS — R29818 Other symptoms and signs involving the nervous system: Secondary | ICD-10-CM | POA: Diagnosis not present

## 2012-12-20 DIAGNOSIS — E785 Hyperlipidemia, unspecified: Secondary | ICD-10-CM | POA: Diagnosis not present

## 2012-12-20 DIAGNOSIS — M542 Cervicalgia: Secondary | ICD-10-CM | POA: Diagnosis not present

## 2012-12-20 DIAGNOSIS — Z79899 Other long term (current) drug therapy: Secondary | ICD-10-CM | POA: Diagnosis not present

## 2013-01-17 DIAGNOSIS — G609 Hereditary and idiopathic neuropathy, unspecified: Secondary | ICD-10-CM | POA: Diagnosis not present

## 2013-01-22 DIAGNOSIS — G609 Hereditary and idiopathic neuropathy, unspecified: Secondary | ICD-10-CM | POA: Diagnosis not present

## 2013-01-22 DIAGNOSIS — M25569 Pain in unspecified knee: Secondary | ICD-10-CM | POA: Diagnosis not present

## 2013-01-28 DIAGNOSIS — R29818 Other symptoms and signs involving the nervous system: Secondary | ICD-10-CM | POA: Diagnosis not present

## 2013-01-29 DIAGNOSIS — IMO0002 Reserved for concepts with insufficient information to code with codable children: Secondary | ICD-10-CM | POA: Diagnosis not present

## 2013-01-29 DIAGNOSIS — M545 Low back pain, unspecified: Secondary | ICD-10-CM | POA: Diagnosis not present

## 2013-02-05 DIAGNOSIS — G609 Hereditary and idiopathic neuropathy, unspecified: Secondary | ICD-10-CM | POA: Diagnosis not present

## 2013-02-07 DIAGNOSIS — M1710 Unilateral primary osteoarthritis, unspecified knee: Secondary | ICD-10-CM | POA: Diagnosis not present

## 2013-02-13 DIAGNOSIS — E1149 Type 2 diabetes mellitus with other diabetic neurological complication: Secondary | ICD-10-CM | POA: Diagnosis not present

## 2013-02-13 DIAGNOSIS — E785 Hyperlipidemia, unspecified: Secondary | ICD-10-CM | POA: Diagnosis not present

## 2013-02-13 DIAGNOSIS — E1142 Type 2 diabetes mellitus with diabetic polyneuropathy: Secondary | ICD-10-CM | POA: Diagnosis not present

## 2013-02-14 DIAGNOSIS — M1710 Unilateral primary osteoarthritis, unspecified knee: Secondary | ICD-10-CM | POA: Diagnosis not present

## 2013-02-19 DIAGNOSIS — M1710 Unilateral primary osteoarthritis, unspecified knee: Secondary | ICD-10-CM | POA: Diagnosis not present

## 2013-02-26 DIAGNOSIS — M1710 Unilateral primary osteoarthritis, unspecified knee: Secondary | ICD-10-CM | POA: Diagnosis not present

## 2013-03-12 DIAGNOSIS — M1710 Unilateral primary osteoarthritis, unspecified knee: Secondary | ICD-10-CM | POA: Diagnosis not present

## 2013-03-14 DIAGNOSIS — E785 Hyperlipidemia, unspecified: Secondary | ICD-10-CM | POA: Diagnosis not present

## 2013-03-19 DIAGNOSIS — M1710 Unilateral primary osteoarthritis, unspecified knee: Secondary | ICD-10-CM | POA: Diagnosis not present

## 2013-03-21 DIAGNOSIS — E785 Hyperlipidemia, unspecified: Secondary | ICD-10-CM | POA: Diagnosis not present

## 2013-03-26 DIAGNOSIS — M1710 Unilateral primary osteoarthritis, unspecified knee: Secondary | ICD-10-CM | POA: Diagnosis not present

## 2013-03-28 DIAGNOSIS — E1149 Type 2 diabetes mellitus with other diabetic neurological complication: Secondary | ICD-10-CM | POA: Diagnosis not present

## 2013-03-28 DIAGNOSIS — E1142 Type 2 diabetes mellitus with diabetic polyneuropathy: Secondary | ICD-10-CM | POA: Diagnosis not present

## 2013-03-28 DIAGNOSIS — E785 Hyperlipidemia, unspecified: Secondary | ICD-10-CM | POA: Diagnosis not present

## 2013-04-29 DIAGNOSIS — M25569 Pain in unspecified knee: Secondary | ICD-10-CM | POA: Diagnosis not present

## 2013-05-24 DIAGNOSIS — E1142 Type 2 diabetes mellitus with diabetic polyneuropathy: Secondary | ICD-10-CM | POA: Diagnosis not present

## 2013-05-24 DIAGNOSIS — E1149 Type 2 diabetes mellitus with other diabetic neurological complication: Secondary | ICD-10-CM | POA: Diagnosis not present

## 2013-05-30 DIAGNOSIS — D485 Neoplasm of uncertain behavior of skin: Secondary | ICD-10-CM | POA: Diagnosis not present

## 2013-06-06 DIAGNOSIS — M5137 Other intervertebral disc degeneration, lumbosacral region: Secondary | ICD-10-CM | POA: Diagnosis not present

## 2013-06-10 DIAGNOSIS — E7800 Pure hypercholesterolemia, unspecified: Secondary | ICD-10-CM | POA: Diagnosis not present

## 2013-06-13 DIAGNOSIS — Z79899 Other long term (current) drug therapy: Secondary | ICD-10-CM | POA: Diagnosis not present

## 2013-06-13 DIAGNOSIS — E785 Hyperlipidemia, unspecified: Secondary | ICD-10-CM | POA: Diagnosis not present

## 2013-07-04 DIAGNOSIS — E785 Hyperlipidemia, unspecified: Secondary | ICD-10-CM | POA: Diagnosis not present

## 2013-07-04 DIAGNOSIS — E1149 Type 2 diabetes mellitus with other diabetic neurological complication: Secondary | ICD-10-CM | POA: Diagnosis not present

## 2013-07-04 DIAGNOSIS — E1142 Type 2 diabetes mellitus with diabetic polyneuropathy: Secondary | ICD-10-CM | POA: Diagnosis not present

## 2013-08-01 DIAGNOSIS — L570 Actinic keratosis: Secondary | ICD-10-CM | POA: Diagnosis not present

## 2013-08-01 DIAGNOSIS — D235 Other benign neoplasm of skin of trunk: Secondary | ICD-10-CM | POA: Diagnosis not present

## 2013-08-01 DIAGNOSIS — L821 Other seborrheic keratosis: Secondary | ICD-10-CM | POA: Diagnosis not present

## 2013-08-01 DIAGNOSIS — C4441 Basal cell carcinoma of skin of scalp and neck: Secondary | ICD-10-CM | POA: Diagnosis not present

## 2013-08-15 DIAGNOSIS — Z23 Encounter for immunization: Secondary | ICD-10-CM | POA: Diagnosis not present

## 2013-09-23 DIAGNOSIS — M5137 Other intervertebral disc degeneration, lumbosacral region: Secondary | ICD-10-CM | POA: Diagnosis not present

## 2013-09-28 DIAGNOSIS — M5137 Other intervertebral disc degeneration, lumbosacral region: Secondary | ICD-10-CM | POA: Diagnosis not present

## 2013-10-03 DIAGNOSIS — E119 Type 2 diabetes mellitus without complications: Secondary | ICD-10-CM | POA: Diagnosis not present

## 2013-10-03 DIAGNOSIS — E785 Hyperlipidemia, unspecified: Secondary | ICD-10-CM | POA: Diagnosis not present

## 2013-10-03 DIAGNOSIS — Z7721 Contact with and (suspected) exposure to potentially hazardous body fluids: Secondary | ICD-10-CM | POA: Diagnosis not present

## 2013-10-04 DIAGNOSIS — M5137 Other intervertebral disc degeneration, lumbosacral region: Secondary | ICD-10-CM | POA: Diagnosis not present

## 2013-10-07 DIAGNOSIS — E119 Type 2 diabetes mellitus without complications: Secondary | ICD-10-CM | POA: Diagnosis not present

## 2013-10-07 DIAGNOSIS — E785 Hyperlipidemia, unspecified: Secondary | ICD-10-CM | POA: Diagnosis not present

## 2013-10-22 DIAGNOSIS — M5137 Other intervertebral disc degeneration, lumbosacral region: Secondary | ICD-10-CM | POA: Diagnosis not present

## 2013-10-31 DIAGNOSIS — L821 Other seborrheic keratosis: Secondary | ICD-10-CM | POA: Diagnosis not present

## 2013-10-31 DIAGNOSIS — I781 Nevus, non-neoplastic: Secondary | ICD-10-CM | POA: Diagnosis not present

## 2013-10-31 DIAGNOSIS — Z85828 Personal history of other malignant neoplasm of skin: Secondary | ICD-10-CM | POA: Diagnosis not present

## 2013-11-13 DIAGNOSIS — M5137 Other intervertebral disc degeneration, lumbosacral region: Secondary | ICD-10-CM | POA: Diagnosis not present

## 2013-11-27 DIAGNOSIS — M5137 Other intervertebral disc degeneration, lumbosacral region: Secondary | ICD-10-CM | POA: Diagnosis not present

## 2014-01-03 DIAGNOSIS — M1710 Unilateral primary osteoarthritis, unspecified knee: Secondary | ICD-10-CM | POA: Diagnosis not present

## 2014-01-09 DIAGNOSIS — E785 Hyperlipidemia, unspecified: Secondary | ICD-10-CM | POA: Diagnosis not present

## 2014-01-09 DIAGNOSIS — E1142 Type 2 diabetes mellitus with diabetic polyneuropathy: Secondary | ICD-10-CM | POA: Diagnosis not present

## 2014-01-09 DIAGNOSIS — E1149 Type 2 diabetes mellitus with other diabetic neurological complication: Secondary | ICD-10-CM | POA: Diagnosis not present

## 2014-01-16 DIAGNOSIS — E785 Hyperlipidemia, unspecified: Secondary | ICD-10-CM | POA: Diagnosis not present

## 2014-01-16 DIAGNOSIS — Z79899 Other long term (current) drug therapy: Secondary | ICD-10-CM | POA: Diagnosis not present

## 2014-01-16 DIAGNOSIS — E7800 Pure hypercholesterolemia, unspecified: Secondary | ICD-10-CM | POA: Diagnosis not present

## 2014-01-21 DIAGNOSIS — M545 Low back pain, unspecified: Secondary | ICD-10-CM | POA: Diagnosis not present

## 2014-01-23 DIAGNOSIS — R3911 Hesitancy of micturition: Secondary | ICD-10-CM | POA: Diagnosis not present

## 2014-01-23 DIAGNOSIS — M545 Low back pain, unspecified: Secondary | ICD-10-CM | POA: Diagnosis not present

## 2014-01-23 DIAGNOSIS — E785 Hyperlipidemia, unspecified: Secondary | ICD-10-CM | POA: Diagnosis not present

## 2014-02-04 DIAGNOSIS — M1710 Unilateral primary osteoarthritis, unspecified knee: Secondary | ICD-10-CM | POA: Diagnosis not present

## 2014-02-04 DIAGNOSIS — M5137 Other intervertebral disc degeneration, lumbosacral region: Secondary | ICD-10-CM | POA: Diagnosis not present

## 2014-02-04 DIAGNOSIS — IMO0002 Reserved for concepts with insufficient information to code with codable children: Secondary | ICD-10-CM | POA: Diagnosis not present

## 2014-02-12 ENCOUNTER — Other Ambulatory Visit: Payer: Self-pay | Admitting: Neurosurgery

## 2014-02-12 ENCOUNTER — Encounter (HOSPITAL_COMMUNITY): Payer: Self-pay | Admitting: Pharmacy Technician

## 2014-02-12 DIAGNOSIS — E663 Overweight: Secondary | ICD-10-CM | POA: Diagnosis not present

## 2014-02-12 DIAGNOSIS — M47817 Spondylosis without myelopathy or radiculopathy, lumbosacral region: Secondary | ICD-10-CM | POA: Diagnosis not present

## 2014-02-14 ENCOUNTER — Encounter (HOSPITAL_COMMUNITY): Payer: Self-pay

## 2014-02-14 ENCOUNTER — Encounter (HOSPITAL_COMMUNITY)
Admission: RE | Admit: 2014-02-14 | Discharge: 2014-02-14 | Disposition: A | Discharge status: Home / Self Care | Payer: Medicare Other | Source: Ambulatory Visit | Attending: Neurosurgery | Admitting: Neurosurgery

## 2014-02-14 ENCOUNTER — Encounter (HOSPITAL_COMMUNITY)
Admission: RE | Admit: 2014-02-14 | Discharge: 2014-02-14 | Disposition: A | Discharge status: Home / Self Care | Payer: Medicare Other | Source: Ambulatory Visit | Attending: Anesthesiology | Admitting: Anesthesiology

## 2014-02-14 DIAGNOSIS — M47817 Spondylosis without myelopathy or radiculopathy, lumbosacral region: Secondary | ICD-10-CM | POA: Diagnosis not present

## 2014-02-14 DIAGNOSIS — Z87891 Personal history of nicotine dependence: Secondary | ICD-10-CM | POA: Diagnosis not present

## 2014-02-14 DIAGNOSIS — I10 Essential (primary) hypertension: Secondary | ICD-10-CM | POA: Diagnosis not present

## 2014-02-14 DIAGNOSIS — K449 Diaphragmatic hernia without obstruction or gangrene: Secondary | ICD-10-CM | POA: Diagnosis not present

## 2014-02-14 DIAGNOSIS — E119 Type 2 diabetes mellitus without complications: Secondary | ICD-10-CM | POA: Diagnosis not present

## 2014-02-14 DIAGNOSIS — Z01812 Encounter for preprocedural laboratory examination: Secondary | ICD-10-CM | POA: Diagnosis not present

## 2014-02-14 DIAGNOSIS — Z79899 Other long term (current) drug therapy: Secondary | ICD-10-CM | POA: Diagnosis not present

## 2014-02-14 DIAGNOSIS — E785 Hyperlipidemia, unspecified: Secondary | ICD-10-CM | POA: Diagnosis not present

## 2014-02-14 DIAGNOSIS — N400 Enlarged prostate without lower urinary tract symptoms: Secondary | ICD-10-CM | POA: Diagnosis not present

## 2014-02-14 DIAGNOSIS — M159 Polyosteoarthritis, unspecified: Secondary | ICD-10-CM | POA: Diagnosis not present

## 2014-02-14 DIAGNOSIS — Z7982 Long term (current) use of aspirin: Secondary | ICD-10-CM | POA: Diagnosis not present

## 2014-02-14 DIAGNOSIS — Z85820 Personal history of malignant melanoma of skin: Secondary | ICD-10-CM | POA: Diagnosis not present

## 2014-02-14 DIAGNOSIS — K219 Gastro-esophageal reflux disease without esophagitis: Secondary | ICD-10-CM | POA: Diagnosis not present

## 2014-02-14 DIAGNOSIS — Z01818 Encounter for other preprocedural examination: Secondary | ICD-10-CM | POA: Diagnosis not present

## 2014-02-14 HISTORY — DX: Other chronic pain: G89.29

## 2014-02-14 HISTORY — DX: Dorsalgia, unspecified: M54.9

## 2014-02-14 HISTORY — DX: Hyperlipidemia, unspecified: E78.5

## 2014-02-14 HISTORY — DX: Personal history of other diseases of the digestive system: Z87.19

## 2014-02-14 HISTORY — DX: Personal history of colonic polyps: Z86.010

## 2014-02-14 HISTORY — DX: Pain in unspecified joint: M25.50

## 2014-02-14 HISTORY — DX: Diverticulitis of intestine, part unspecified, without perforation or abscess without bleeding: K57.92

## 2014-02-14 HISTORY — DX: Personal history of urinary calculi: Z87.442

## 2014-02-14 HISTORY — DX: Effusion, unspecified joint: M25.40

## 2014-02-14 HISTORY — DX: Malignant melanoma of skin, unspecified: C43.9

## 2014-02-14 LAB — CBC WITH DIFFERENTIAL/PLATELET
BASOS ABS: 0 10*3/uL (ref 0.0–0.1)
Basophils Relative: 0 % (ref 0–1)
Eosinophils Absolute: 0.1 10*3/uL (ref 0.0–0.7)
Eosinophils Relative: 1 % (ref 0–5)
HEMATOCRIT: 42.8 % (ref 39.0–52.0)
Hemoglobin: 15 g/dL (ref 13.0–17.0)
LYMPHS PCT: 24 % (ref 12–46)
Lymphs Abs: 1.6 10*3/uL (ref 0.7–4.0)
MCH: 30.9 pg (ref 26.0–34.0)
MCHC: 35 g/dL (ref 30.0–36.0)
MCV: 88.2 fL (ref 78.0–100.0)
MONO ABS: 0.6 10*3/uL (ref 0.1–1.0)
MONOS PCT: 9 % (ref 3–12)
NEUTROS ABS: 4.5 10*3/uL (ref 1.7–7.7)
NEUTROS PCT: 66 % (ref 43–77)
Platelets: 239 10*3/uL (ref 150–400)
RBC: 4.85 MIL/uL (ref 4.22–5.81)
RDW: 13.2 % (ref 11.5–15.5)
WBC: 6.8 10*3/uL (ref 4.0–10.5)

## 2014-02-14 LAB — BASIC METABOLIC PANEL
BUN: 15 mg/dL (ref 6–23)
CHLORIDE: 107 meq/L (ref 96–112)
CO2: 23 mEq/L (ref 19–32)
Calcium: 9.9 mg/dL (ref 8.4–10.5)
Creatinine, Ser: 0.97 mg/dL (ref 0.50–1.35)
GFR calc Af Amer: >90 mL/min (ref >90)
GFR calc non Af Amer: 79 mL/min — ABNORMAL LOW (ref >90)
Glucose, Bld: 99 mg/dL (ref 70–99)
POTASSIUM: 4.9 meq/L (ref 3.7–5.3)
SODIUM: 143 meq/L (ref 137–147)

## 2014-02-14 LAB — SURGICAL PCR SCREEN
MRSA, PCR: NEGATIVE
Staphylococcus aureus: NEGATIVE

## 2014-02-14 NOTE — Progress Notes (Signed)
Average fasting sugar 114

## 2014-02-14 NOTE — Pre-Procedure Instructions (Signed)
Matthew Nash  02/14/2014   Your procedure is scheduled on:  Mon, Mar 9 @ 10:20 AM  Report to [facility] Stay Entrance A  at 8:15 AM.  Call this number if you have problems the morning of surgery: [phone]   Remember:   Do not eat food or drink liquids after midnight.   Take these medicines the morning of surgery with A SIP OF WATER: Omeprazole(Prilosec)              Stop taking your Aspirin. No Goody's,BC's,Aleve,Ibuprofen,Fish Oil,or any Herbal Medications   Do not wear jewelry  Do not wear lotions, powders, or colognes. You may wear deodorant.  Do not shave 48 hours prior to surgery. Men may shave face and neck.  Do not bring valuables to the hospital.  [facility] is not responsible                  for any belongings or valuables.               Contacts, dentures or bridgework may not be worn into surgery.  Leave suitcase in the car. After surgery it may be brought to your room.  For patients admitted to the hospital, discharge time is determined by your                treatment team.               Patients discharged the day of surgery will not be allowed to drive  home.    Special Instructions:  [place] - Preparing for Surgery  Before surgery, you can play an important role.  Because skin is not sterile, your skin needs to be as free of germs as possible.  You can reduce the number of germs on you skin by washing with CHG (chlorahexidine gluconate) soap before surgery.  CHG is an antiseptic cleaner which kills germs and bonds with the skin to continue killing germs even after washing.  Please DO NOT use if you have an allergy to CHG or antibacterial soaps.  If your skin becomes reddened/irritated stop using the CHG and inform your nurse when you arrive at Short Stay.  Do not shave (including legs and underarms) for at least 48 hours prior to the first CHG shower.  You may shave your face.  Please follow these instructions carefully:   1.  Shower with CHG Soap  the night before surgery and the                                morning of Surgery.  2.  If you choose to wash your hair, wash your hair first as usual with your       normal shampoo.  3.  After you shampoo, rinse your hair and body thoroughly to remove the                      Shampoo.  4.  Use CHG as you would any other liquid soap.  You can apply chg directly       to the skin and wash gently with scrungie or a clean washcloth.  5.  Apply the CHG Soap to your body ONLY FROM THE NECK DOWN.        Do not use on open wounds or open sores.  Avoid contact with your eyes,       ears,  mouth and genitals (private parts).  Wash genitals (private parts)       with your normal soap.  6.  Wash thoroughly, paying special attention to the area where your surgery        will be performed.  7.  Thoroughly rinse your body with warm water from the neck down.  8.  DO NOT shower/wash with your normal soap after using and rinsing off       the CHG Soap.  9.  Pat yourself dry with a clean towel.            10.  Wear clean pajamas.            11.  Place clean sheets on your bed the night of your first shower and do not        sleep with pets.  Day of Surgery  Do not apply any lotions/deoderants the morning of surgery.  Please wear clean clothes to the hospital/surgery center.     Please read over the following fact sheets that you were given: Pain Booklet, Coughing and Deep Breathing, MRSA Information and Surgical Site Infection Prevention

## 2014-02-14 NOTE — Progress Notes (Signed)
Pt doesn't have a cardiologist  Stress test done early 90's  Denies ever having an echo or heart cath  [place] [facility] [address]  Denies CXR in past yr  EKG to be requested from Medical MD

## 2014-02-16 MED ORDER — VANCOMYCIN HCL 10 G IV SOLR
1500.0000 mg | INTRAVENOUS | Status: AC
  Administered 2014-02-17: 1500 mg via INTRAVENOUS
  Filled 2014-02-16: qty 1500

## 2014-02-17 ENCOUNTER — Inpatient Hospital Stay (HOSPITAL_COMMUNITY): Payer: Medicare Other

## 2014-02-17 ENCOUNTER — Encounter (HOSPITAL_COMMUNITY): Payer: Self-pay | Admitting: Surgery

## 2014-02-17 ENCOUNTER — Encounter (HOSPITAL_COMMUNITY): Payer: Medicare Other | Admitting: Anesthesiology

## 2014-02-17 ENCOUNTER — Inpatient Hospital Stay (HOSPITAL_COMMUNITY): Payer: Medicare Other | Admitting: Anesthesiology

## 2014-02-17 ENCOUNTER — Ambulatory Visit (HOSPITAL_COMMUNITY)
Admission: RE | Admit: 2014-02-17 | Discharge: 2014-02-17 | Disposition: A | Discharge status: Home / Self Care | Payer: Medicare Other | Source: Ambulatory Visit | Attending: Neurosurgery | Admitting: Neurosurgery

## 2014-02-17 ENCOUNTER — Encounter (HOSPITAL_COMMUNITY)
Admission: RE | Disposition: A | Discharge status: Home / Self Care | Payer: Self-pay | Source: Ambulatory Visit | Attending: Neurosurgery

## 2014-02-17 DIAGNOSIS — Z01818 Encounter for other preprocedural examination: Secondary | ICD-10-CM | POA: Diagnosis not present

## 2014-02-17 DIAGNOSIS — K219 Gastro-esophageal reflux disease without esophagitis: Secondary | ICD-10-CM | POA: Insufficient documentation

## 2014-02-17 DIAGNOSIS — M159 Polyosteoarthritis, unspecified: Secondary | ICD-10-CM | POA: Insufficient documentation

## 2014-02-17 DIAGNOSIS — M47817 Spondylosis without myelopathy or radiculopathy, lumbosacral region: Principal | ICD-10-CM | POA: Diagnosis present

## 2014-02-17 DIAGNOSIS — N400 Enlarged prostate without lower urinary tract symptoms: Secondary | ICD-10-CM | POA: Diagnosis not present

## 2014-02-17 DIAGNOSIS — M48061 Spinal stenosis, lumbar region without neurogenic claudication: Secondary | ICD-10-CM | POA: Diagnosis not present

## 2014-02-17 DIAGNOSIS — E785 Hyperlipidemia, unspecified: Secondary | ICD-10-CM | POA: Insufficient documentation

## 2014-02-17 DIAGNOSIS — Z87891 Personal history of nicotine dependence: Secondary | ICD-10-CM | POA: Insufficient documentation

## 2014-02-17 DIAGNOSIS — Z79899 Other long term (current) drug therapy: Secondary | ICD-10-CM | POA: Insufficient documentation

## 2014-02-17 DIAGNOSIS — Z01812 Encounter for preprocedural laboratory examination: Secondary | ICD-10-CM | POA: Diagnosis not present

## 2014-02-17 DIAGNOSIS — I10 Essential (primary) hypertension: Secondary | ICD-10-CM | POA: Insufficient documentation

## 2014-02-17 DIAGNOSIS — K449 Diaphragmatic hernia without obstruction or gangrene: Secondary | ICD-10-CM | POA: Insufficient documentation

## 2014-02-17 DIAGNOSIS — Z7982 Long term (current) use of aspirin: Secondary | ICD-10-CM | POA: Insufficient documentation

## 2014-02-17 DIAGNOSIS — E119 Type 2 diabetes mellitus without complications: Secondary | ICD-10-CM | POA: Insufficient documentation

## 2014-02-17 DIAGNOSIS — Z85820 Personal history of malignant melanoma of skin: Secondary | ICD-10-CM | POA: Insufficient documentation

## 2014-02-17 DIAGNOSIS — M539 Dorsopathy, unspecified: Secondary | ICD-10-CM | POA: Diagnosis not present

## 2014-02-17 HISTORY — PX: LUMBAR LAMINECTOMY/DECOMPRESSION MICRODISCECTOMY: SHX5026

## 2014-02-17 LAB — GLUCOSE, CAPILLARY
GLUCOSE-CAPILLARY: 117 mg/dL — AB (ref 70–99)
Glucose-Capillary: 113 mg/dL — ABNORMAL HIGH (ref 70–99)
Glucose-Capillary: 117 mg/dL — ABNORMAL HIGH (ref 70–99)

## 2014-02-17 SURGERY — LUMBAR LAMINECTOMY/DECOMPRESSION MICRODISCECTOMY 1 LEVEL
Anesthesia: General | Site: Spine Lumbar | Laterality: Right

## 2014-02-17 MED ORDER — GLYCOPYRROLATE 0.2 MG/ML IJ SOLN
INTRAMUSCULAR | Status: AC
  Filled 2014-02-17: qty 4

## 2014-02-17 MED ORDER — FENTANYL CITRATE 0.05 MG/ML IJ SOLN
INTRAMUSCULAR | Status: DC | PRN
  Administered 2014-02-17 (×3): 50 ug via INTRAVENOUS

## 2014-02-17 MED ORDER — EPHEDRINE SULFATE 50 MG/ML IJ SOLN
INTRAMUSCULAR | Status: DC | PRN
  Administered 2014-02-17 (×2): 10 mg via INTRAVENOUS

## 2014-02-17 MED ORDER — 0.9 % SODIUM CHLORIDE (POUR BTL) OPTIME
TOPICAL | Status: DC | PRN
  Administered 2014-02-17: 1000 mL

## 2014-02-17 MED ORDER — DEXAMETHASONE SODIUM PHOSPHATE 10 MG/ML IJ SOLN
INTRAMUSCULAR | Status: AC
  Administered 2014-02-17: 10 mg via INTRAVENOUS
  Filled 2014-02-17: qty 1

## 2014-02-17 MED ORDER — SODIUM CHLORIDE 0.9 % IJ SOLN: 3.0000 mL | INTRAMUSCULAR | Status: DC | PRN

## 2014-02-17 MED ORDER — METFORMIN HCL 500 MG PO TABS
1000.0000 mg | ORAL_TABLET | Freq: Two times a day (BID) | ORAL | Status: DC
  Filled 2014-02-17 (×2): qty 2

## 2014-02-17 MED ORDER — LIRAGLUTIDE 18 MG/3ML ~~LOC~~ SOPN: 1.8000 mg | PEN_INJECTOR | Freq: Every day | SUBCUTANEOUS | Status: DC

## 2014-02-17 MED ORDER — BUPIVACAINE HCL (PF) 0.25 % IJ SOLN
INTRAMUSCULAR | Status: DC | PRN
  Administered 2014-02-17: 20 mL

## 2014-02-17 MED ORDER — EPHEDRINE SULFATE 50 MG/ML IJ SOLN
INTRAMUSCULAR | Status: AC
  Filled 2014-02-17: qty 1

## 2014-02-17 MED ORDER — KETOROLAC TROMETHAMINE 30 MG/ML IJ SOLN
INTRAMUSCULAR | Status: DC | PRN
  Administered 2014-02-17: 30 mg via INTRAVENOUS

## 2014-02-17 MED ORDER — SODIUM CHLORIDE 0.9 % IR SOLN
Status: DC | PRN
  Administered 2014-02-17: 12:00:00

## 2014-02-17 MED ORDER — VANCOMYCIN HCL 10 G IV SOLR
1250.0000 mg | Freq: Once | INTRAVENOUS | Status: DC
  Filled 2014-02-17: qty 1250

## 2014-02-17 MED ORDER — ARTIFICIAL TEARS OP OINT
TOPICAL_OINTMENT | OPHTHALMIC | Status: AC
Start: 2014-02-17 — End: 2014-02-17
  Filled 2014-02-17: qty 3.5

## 2014-02-17 MED ORDER — PROPOFOL 10 MG/ML IV BOLUS
INTRAVENOUS | Status: AC
  Filled 2014-02-17: qty 20

## 2014-02-17 MED ORDER — ARTIFICIAL TEARS OP OINT
TOPICAL_OINTMENT | OPHTHALMIC | Status: DC | PRN
  Administered 2014-02-17: 1 via OPHTHALMIC

## 2014-02-17 MED ORDER — FENOFIBRATE 160 MG PO TABS: 160.0000 mg | ORAL_TABLET | Freq: Every day | ORAL | Status: DC

## 2014-02-17 MED ORDER — HYDROCODONE-ACETAMINOPHEN 5-325 MG PO TABS: 1.0000 | ORAL_TABLET | ORAL | Status: DC | PRN

## 2014-02-17 MED ORDER — ASPIRIN EC 81 MG PO TBEC: 81.0000 mg | DELAYED_RELEASE_TABLET | Freq: Every day | ORAL | Status: DC

## 2014-02-17 MED ORDER — LISINOPRIL 10 MG PO TABS
10.0000 mg | ORAL_TABLET | Freq: Every evening | ORAL | Status: DC
  Filled 2014-02-17: qty 1

## 2014-02-17 MED ORDER — SODIUM CHLORIDE 0.9 % IJ SOLN: 3.0000 mL | Freq: Two times a day (BID) | INTRAMUSCULAR | Status: DC

## 2014-02-17 MED ORDER — OXYCODONE HCL 5 MG PO TABS: 5.0000 mg | ORAL_TABLET | Freq: Once | ORAL | Status: DC | PRN

## 2014-02-17 MED ORDER — ONDANSETRON HCL 4 MG/2ML IJ SOLN: 4.0000 mg | Freq: Four times a day (QID) | INTRAMUSCULAR | Status: DC | PRN

## 2014-02-17 MED ORDER — SIMVASTATIN 10 MG PO TABS
10.0000 mg | ORAL_TABLET | Freq: Every day | ORAL | Status: DC
  Filled 2014-02-17: qty 1

## 2014-02-17 MED ORDER — CYCLOBENZAPRINE HCL 10 MG PO TABS: 10.0000 mg | ORAL_TABLET | Freq: Three times a day (TID) | ORAL | Status: DC | PRN

## 2014-02-17 MED ORDER — MENTHOL 3 MG MT LOZG: 1.0000 | LOZENGE | OROMUCOSAL | Status: DC | PRN

## 2014-02-17 MED ORDER — PHENOL 1.4 % MT LIQD: 1.0000 | OROMUCOSAL | Status: DC | PRN

## 2014-02-17 MED ORDER — PANTOPRAZOLE SODIUM 40 MG PO TBEC: 80.0000 mg | DELAYED_RELEASE_TABLET | Freq: Every day | ORAL | Status: DC

## 2014-02-17 MED ORDER — ROCURONIUM BROMIDE 50 MG/5ML IV SOLN
INTRAVENOUS | Status: AC
  Filled 2014-02-17: qty 1

## 2014-02-17 MED ORDER — FENTANYL CITRATE 0.05 MG/ML IJ SOLN
INTRAMUSCULAR | Status: AC
  Filled 2014-02-17: qty 5

## 2014-02-17 MED ORDER — KETOROLAC TROMETHAMINE 30 MG/ML IJ SOLN: 30.0000 mg | Freq: Four times a day (QID) | INTRAMUSCULAR | Status: DC

## 2014-02-17 MED ORDER — SODIUM CHLORIDE 0.9 % IV SOLN: 250.0000 mL | INTRAVENOUS | Status: DC

## 2014-02-17 MED ORDER — HYDROMORPHONE HCL PF 1 MG/ML IJ SOLN: 0.2500 mg | INTRAMUSCULAR | Status: DC | PRN

## 2014-02-17 MED ORDER — OXYCODONE-ACETAMINOPHEN 5-325 MG PO TABS: 1.0000 | ORAL_TABLET | ORAL | Status: DC | PRN

## 2014-02-17 MED ORDER — KETOROLAC TROMETHAMINE 30 MG/ML IJ SOLN
INTRAMUSCULAR | Status: AC
  Filled 2014-02-17: qty 1

## 2014-02-17 MED ORDER — HEMOSTATIC AGENTS (NO CHARGE) OPTIME
TOPICAL | Status: DC | PRN
  Administered 2014-02-17: 1 via TOPICAL

## 2014-02-17 MED ORDER — ONDANSETRON HCL 4 MG/2ML IJ SOLN
INTRAMUSCULAR | Status: DC | PRN
  Administered 2014-02-17: 4 mg via INTRAVENOUS

## 2014-02-17 MED ORDER — GLYCOPYRROLATE 0.2 MG/ML IJ SOLN
INTRAMUSCULAR | Status: DC | PRN
  Administered 2014-02-17: .8 mg via INTRAVENOUS

## 2014-02-17 MED ORDER — LIDOCAINE HCL (CARDIAC) 20 MG/ML IV SOLN
INTRAVENOUS | Status: DC | PRN
  Administered 2014-02-17: 80 mg via INTRAVENOUS

## 2014-02-17 MED ORDER — PROPOFOL 10 MG/ML IV BOLUS
INTRAVENOUS | Status: DC | PRN
  Administered 2014-02-17: 150 mg via INTRAVENOUS

## 2014-02-17 MED ORDER — OXYCODONE HCL 5 MG/5ML PO SOLN: 5.0000 mg | Freq: Once | ORAL | Status: DC | PRN

## 2014-02-17 MED ORDER — HYDROMORPHONE HCL PF 1 MG/ML IJ SOLN: 0.5000 mg | INTRAMUSCULAR | Status: DC | PRN

## 2014-02-17 MED ORDER — ACETAMINOPHEN 325 MG PO TABS: 650.0000 mg | ORAL_TABLET | ORAL | Status: DC | PRN

## 2014-02-17 MED ORDER — NEOSTIGMINE METHYLSULFATE 1 MG/ML IJ SOLN
INTRAMUSCULAR | Status: DC | PRN
  Administered 2014-02-17: 5 mg via INTRAVENOUS

## 2014-02-17 MED ORDER — ROCURONIUM BROMIDE 100 MG/10ML IV SOLN
INTRAVENOUS | Status: DC | PRN
  Administered 2014-02-17: 50 mg via INTRAVENOUS

## 2014-02-17 MED ORDER — NEOSTIGMINE METHYLSULFATE 1 MG/ML IJ SOLN
INTRAMUSCULAR | Status: AC
  Filled 2014-02-17: qty 10

## 2014-02-17 MED ORDER — THROMBIN 5000 UNITS EX SOLR
CUTANEOUS | Status: DC | PRN
  Administered 2014-02-17 (×2): 5000 [IU] via TOPICAL

## 2014-02-17 MED ORDER — LIDOCAINE HCL (CARDIAC) 20 MG/ML IV SOLN
INTRAVENOUS | Status: AC
Start: 2014-02-17 — End: 2014-02-17
  Filled 2014-02-17: qty 5

## 2014-02-17 MED ORDER — ONDANSETRON HCL 4 MG/2ML IJ SOLN
INTRAMUSCULAR | Status: AC
  Filled 2014-02-17: qty 2

## 2014-02-17 MED ORDER — STERILE WATER FOR INJECTION IJ SOLN
INTRAMUSCULAR | Status: AC
  Filled 2014-02-17: qty 10

## 2014-02-17 MED ORDER — ACETAMINOPHEN 650 MG RE SUPP: 650.0000 mg | RECTAL | Status: DC | PRN

## 2014-02-17 MED ORDER — SENNA 8.6 MG PO TABS: 1.0000 | ORAL_TABLET | Freq: Two times a day (BID) | ORAL | Status: DC

## 2014-02-17 MED ORDER — DEXAMETHASONE SODIUM PHOSPHATE 10 MG/ML IJ SOLN: 10.0000 mg | INTRAMUSCULAR | Status: DC

## 2014-02-17 MED ORDER — ASPIRIN 81 MG PO TABS: 81.0000 mg | ORAL_TABLET | Freq: Every day | ORAL | Status: DC

## 2014-02-17 MED ORDER — LACTATED RINGERS IV SOLN
INTRAVENOUS | Status: DC
  Administered 2014-02-17 (×2): via INTRAVENOUS

## 2014-02-17 MED ORDER — ALUM & MAG HYDROXIDE-SIMETH 200-200-20 MG/5ML PO SUSP: 30.0000 mL | Freq: Four times a day (QID) | ORAL | Status: DC | PRN

## 2014-02-17 MED ORDER — ONDANSETRON HCL 4 MG/2ML IJ SOLN: 4.0000 mg | INTRAMUSCULAR | Status: DC | PRN

## 2014-02-17 SURGICAL SUPPLY — 59 items
ADH SKN CLS APL DERMABOND .7 (GAUZE/BANDAGES/DRESSINGS)
ADH SKN CLS LQ APL DERMABOND (GAUZE/BANDAGES/DRESSINGS) ×1
APL SKNCLS STERI-STRIP NONHPOA (GAUZE/BANDAGES/DRESSINGS) ×1
BAG DECANTER FOR FLEXI CONT (MISCELLANEOUS) ×3 IMPLANT
BENZOIN TINCTURE PRP APPL 2/3 (GAUZE/BANDAGES/DRESSINGS) ×3 IMPLANT
BLADE SURG ROTATE 9660 (MISCELLANEOUS) ×2 IMPLANT
BRUSH SCRUB EZ PLAIN DRY (MISCELLANEOUS) ×3 IMPLANT
BUR CUTTER 7.0 ROUND (BURR) ×3 IMPLANT
CANISTER SUCT 3000ML (MISCELLANEOUS) ×3 IMPLANT
CLOSURE WOUND 1/2 X4 (GAUZE/BANDAGES/DRESSINGS) ×1
CONT SPEC 4OZ CLIKSEAL STRL BL (MISCELLANEOUS) ×3 IMPLANT
DECANTER SPIKE VIAL GLASS SM (MISCELLANEOUS) ×3 IMPLANT
DERMABOND ADHESIVE PROPEN (GAUZE/BANDAGES/DRESSINGS) ×2
DERMABOND ADVANCED (GAUZE/BANDAGES/DRESSINGS)
DERMABOND ADVANCED .7 DNX12 (GAUZE/BANDAGES/DRESSINGS) ×1 IMPLANT
DERMABOND ADVANCED .7 DNX6 (GAUZE/BANDAGES/DRESSINGS) IMPLANT
DRAPE LAPAROTOMY 100X72X124 (DRAPES) ×3 IMPLANT
DRAPE MICROSCOPE LEICA (MISCELLANEOUS) ×2 IMPLANT
DRAPE MICROSCOPE ZEISS OPMI (DRAPES) ×1 IMPLANT
DRAPE POUCH INSTRU U-SHP 10X18 (DRAPES) ×3 IMPLANT
DRAPE PROXIMA HALF (DRAPES) IMPLANT
DRAPE SURG 17X23 STRL (DRAPES) ×6 IMPLANT
DRSG OPSITE POSTOP 3X4 (GAUZE/BANDAGES/DRESSINGS) ×2 IMPLANT
DURAPREP 26ML APPLICATOR (WOUND CARE) ×3 IMPLANT
ELECT REM PT RETURN 9FT ADLT (ELECTROSURGICAL) ×3
ELECTRODE REM PT RTRN 9FT ADLT (ELECTROSURGICAL) ×1 IMPLANT
GAUZE SPONGE 4X4 16PLY XRAY LF (GAUZE/BANDAGES/DRESSINGS) IMPLANT
GLOVE BIOGEL PI IND STRL 7.0 (GLOVE) IMPLANT
GLOVE BIOGEL PI IND STRL 8.5 (GLOVE) IMPLANT
GLOVE BIOGEL PI INDICATOR 7.0 (GLOVE) ×6
GLOVE BIOGEL PI INDICATOR 8.5 (GLOVE) ×2
GLOVE ECLIPSE 8.5 STRL (GLOVE) ×5 IMPLANT
GLOVE EXAM NITRILE LRG STRL (GLOVE) IMPLANT
GLOVE EXAM NITRILE MD LF STRL (GLOVE) IMPLANT
GLOVE EXAM NITRILE XL STR (GLOVE) IMPLANT
GLOVE EXAM NITRILE XS STR PU (GLOVE) IMPLANT
GLOVE SS BIOGEL STRL SZ 6.5 (GLOVE) IMPLANT
GLOVE SUPERSENSE BIOGEL SZ 6.5 (GLOVE) ×6
GOWN BRE IMP SLV AUR LG STRL (GOWN DISPOSABLE) ×2 IMPLANT
GOWN BRE IMP SLV AUR XL STRL (GOWN DISPOSABLE) ×3 IMPLANT
GOWN STRL REIN 2XL LVL4 (GOWN DISPOSABLE) ×2 IMPLANT
KIT BASIN OR (CUSTOM PROCEDURE TRAY) ×3 IMPLANT
KIT ROOM TURNOVER OR (KITS) ×3 IMPLANT
NDL SPNL 22GX3.5 QUINCKE BK (NEEDLE) ×1 IMPLANT
NEEDLE HYPO 22GX1.5 SAFETY (NEEDLE) ×3 IMPLANT
NEEDLE SPNL 22GX3.5 QUINCKE BK (NEEDLE) ×3 IMPLANT
NS IRRIG 1000ML POUR BTL (IV SOLUTION) ×3 IMPLANT
PACK LAMINECTOMY NEURO (CUSTOM PROCEDURE TRAY) ×3 IMPLANT
PAD ARMBOARD 7.5X6 YLW CONV (MISCELLANEOUS) ×13 IMPLANT
RUBBERBAND STERILE (MISCELLANEOUS) ×6 IMPLANT
SPONGE GAUZE 4X4 12PLY (GAUZE/BANDAGES/DRESSINGS) ×1 IMPLANT
SPONGE SURGIFOAM ABS GEL SZ50 (HEMOSTASIS) ×3 IMPLANT
STRIP CLOSURE SKIN 1/2X4 (GAUZE/BANDAGES/DRESSINGS) ×2 IMPLANT
SUT VIC AB 2-0 CT1 18 (SUTURE) ×3 IMPLANT
SUT VIC AB 3-0 SH 8-18 (SUTURE) ×3 IMPLANT
SYR 20ML ECCENTRIC (SYRINGE) ×3 IMPLANT
TOWEL OR 17X24 6PK STRL BLUE (TOWEL DISPOSABLE) ×3 IMPLANT
TOWEL OR 17X26 10 PK STRL BLUE (TOWEL DISPOSABLE) ×3 IMPLANT
WATER STERILE IRR 1000ML POUR (IV SOLUTION) ×3 IMPLANT

## 2014-02-17 NOTE — Brief Op Note (Signed)
02/17/2014  12:36 PM  PATIENT:  Nita Sells  78 y.o. male  PRE-OPERATIVE DIAGNOSIS:  spondylosis  POST-OPERATIVE DIAGNOSIS:  spondylosis  PROCEDURE:  Procedure(s) with comments: RIGHT LUMBAR TWO-THREE LAMINECTOMY (Right) - right   SURGEON:  Surgeon(s) and Role:    * Charlie Pitter, MD - Primary    * Kristeen Miss, MD - Assisting  PHYSICIAN ASSISTANT:   ASSISTANTS:    ANESTHESIA:   general  EBL:  Total I/O In: 1000 [I.V.:1000] Out: 300 [Blood:300]  BLOOD ADMINISTERED:none  DRAINS: none   LOCAL MEDICATIONS USED:  MARCAINE     SPECIMEN:  No Specimen  DISPOSITION OF SPECIMEN:  N/A  COUNTS:  YES  TOURNIQUET:  * No tourniquets in log *  DICTATION: .Dragon Dictation  PLAN OF CARE: Admit for overnight observation  PATIENT DISPOSITION:  PACU - hemodynamically stable.   Delay start of Pharmacological VTE agent (>24hrs) due to surgical blood loss or risk of bleeding: yes

## 2014-02-17 NOTE — Progress Notes (Signed)
ANTIBIOTIC CONSULT NOTE - INITIAL  Pharmacy Consult for Vancomycin  Indication: Surgical prophylaxis  Allergies  Allergen Reactions  . Shrimp [Shellfish Allergy] Itching  . Penicillins Rash    Patient Measurements:   Body Weight: 97 kg  Vital Signs: Temp: 97.5 F (36.4 C) (03/09 1403) Temp src: Oral (03/09 0756) BP: 136/75 mmHg (03/09 1403) Pulse Rate: 66 (03/09 1403) Intake/Output from previous day:   Intake/Output from this shift: Total I/O In: 1000 [I.V.:1000] Out: 300 [Blood:300]  Labs: No results found for this basename: WBC, HGB, PLT, LABCREA, CREATININE,  in the last 72 hours The CrCl is unknown because both a height and weight (above a minimum accepted value) are required for this calculation. No results found for this basename: VANCOTROUGH, Corlis Leak, VANCORANDOM, GENTTROUGH, GENTPEAK, GENTRANDOM, TOBRATROUGH, TOBRAPEAK, TOBRARND, AMIKACINPEAK, AMIKACINTROU, AMIKACIN,  in the last 72 hours   Microbiology: Recent Results (from the past 720 hour(s))  SURGICAL PCR SCREEN     Status: None   Collection Time    02/14/14  1:14 PM      Result Value Ref Range Status   MRSA, PCR NEGATIVE  NEGATIVE Final   Staphylococcus aureus NEGATIVE  NEGATIVE Final   Comment:            The Xpert SA Assay (FDA     approved for NASAL specimens     in patients over 76 years of age),     is one component of     a comprehensive surveillance     program.  Test performance has     been validated by [employer] for patients greater     than or equal to 91 year old.     It is not intended     to diagnose infection nor to     guide or monitor treatment.    Medical History: Past Medical History  Diagnosis Date  . BPH (benign prostatic hyperplasia)     hx s/p turp  . Arthritis     left knee and right shoulder  . Hyperlipidemia     takes Simvastatin and Tricor daily  . Hypertension     takes Lisinopril daily  . GERD (gastroesophageal reflux disease)     takes Omeprazole  daily  . Diabetes mellitus     takes Metformin and Victoza daily  . Joint pain   . Joint swelling   . Chronic back pain     spondylosis  . Melanoma     right hand;basal cell carcinoma  . H/O hiatal hernia   . History of colon polyps   . Diverticulitis   . History of kidney stones   . History of staph food poisoning     many many yrs ago   Assessment: 78 YOM s/p spinal surgery, pharmacy is consulted to dose vancomycin for post-op prophylaxis. Pt. Has penicillin allergy. He received vancomycin 1500 mg pre-op at 1040. Scr 0.97 on 3/6, est. crcl ~ 80-90 ml/min. No drain, confirmed with RN. No PTA abx. No s/sx of infection noted.  Goal of Therapy:  Prevent surgical infections  Plan:  - Vancomycin 1250 mg IV x 1 @ 2230 tonight. - Pharmacy sign off.  Maryanna Shape, PharmD, BCPS  Clinical Pharmacist  Pager: [phone]   02/17/2014,2:47 PM

## 2014-02-17 NOTE — Op Note (Signed)
Date of procedure: 02/17/2014  Date of dictation: Same  Service: Neurosurgery  Preoperative diagnosis: Right L2-3 spondylosis with stenosis  Postoperative diagnosis: Same  Procedure Name: Right L2 and L3 decompressive laminotomy with a right L2 and L3 foraminotomies. Right L2-3 microdiscectomy  Surgeon:Raney Koeppen A.Klyde Banka, M.D.  Asst. Surgeon: Ellene Route  Anesthesia: General  Indication: Patient is a 78 year old male with severe back and right lower chamois pain and weakness. Workup demonstrates evidence of marked disc space collapse and associated spondylosis at L2-3 with very significant stenosis right greater than left. Patient's failed conservative management and presents now for right-sided L2-3 decompressive surgery.  Operative note: After induction of anesthesia, patient positioned prone onto a Wilson frame and appropriate padded. Lumbar region prepped and draped. Incision made overlying L2-3. A subperiosteal dissection performed the right side. Retractor placed. X-ray taken. Level confirmed. Laminotomy performed using high-speed drill and Kerrison rongeurs to remove the inferior aspect of lamina of L2 medial aspect the L2-3 facet joint and the superior aspect the L3 lamina. Ligament flavum was elevated and resected so fashion using Kerrison rongeurs. Underlying thecal sac and L3 nerve root were notified. Microscope was then brought field and used for microdissection of the spinal canal. Epidural venous plexus was coagulated and cut. Thecal sac and L3 nerve root were gently mobilized and retracted towards midline. Disc space with associated osteophyte and disc herniation was identified. The space was entered using a straight osteotome and Kerrison rongeurs to remove the osteophyte. The space itself was very collapsed. The disc space itself was never formally injured. All elements of the trimming osteophyte and disc herniation were resected. Foraminotomies were performed on course exiting L2 and L3  nerve roots. At this point a very thorough decompression had been achieved. There was no evidence of injury to the thecal sac or nerve roots. Wound is then irrigated with and bike solution. Gelfoam was placed topically for hemostasis which Ascent be good. Microscope and retractor system was removed. Hemostasis of the muscle was achieved with the electrocautery. Wounds and close in layers with Vicryl sutures. Steri-Strips and sterile dressing were applied. There were no apparent complications. Patient tolerated the procedure well. He returns to the recovery room postop.

## 2014-02-17 NOTE — Anesthesia Preprocedure Evaluation (Addendum)
Anesthesia Evaluation  Patient identified by MRN, date of birth, ID band Patient awake    Reviewed: Allergy & Precautions, H&P , NPO status , Patient's Chart, lab work & pertinent test results  Airway Mallampati: II  Neck ROM: full    Dental  (+) Teeth Intact, Dental Advisory Given   Pulmonary former smoker,          Cardiovascular hypertension, Rhythm:Regular Rate:Normal     Neuro/Psych    GI/Hepatic hiatal hernia, GERD-  ,  Endo/Other  diabetes, Type 2  Renal/GU      Musculoskeletal  (+) Arthritis -,   Abdominal   Peds  Hematology   Anesthesia Other Findings   Reproductive/Obstetrics                          Anesthesia Physical Anesthesia Plan  ASA: II  Anesthesia Plan: General   Post-op Pain Management:    Induction: Intravenous  Airway Management Planned: Oral ETT  Additional Equipment:   Intra-op Plan:   Post-operative Plan: Extubation in OR  Informed Consent: I have reviewed the patients History and Physical, chart, labs and discussed the procedure including the risks, benefits and alternatives for the proposed anesthesia with the patient or authorized representative who has indicated his/her understanding and acceptance.     Plan Discussed with: CRNA, Anesthesiologist and Surgeon  Anesthesia Plan Comments:         Anesthesia Quick Evaluation

## 2014-02-17 NOTE — Discharge Summary (Signed)
Physician Discharge Summary  Patient ID: Matthew Nash MRN: [id number] DOB/AGE: Jun 16, 1936 78 y.o.  Admit date: 02/17/2014 Discharge date: 02/17/2014  Admission Diagnoses:  Discharge Diagnoses:  Principal Problem:   Lumbosacral spondylosis without myelopathy Active Problems:   Spondylosis, lumbosacral   Discharged Condition: good  H2ospital Course: Patient in the hospital where he underwent uncomplicated lumbar laminotomy and foraminotomies. Postop he is doing well. Back and leg pain much better. Strength cessation much better. He has been up ambulating and is ready for discharge home.  Consults:   Significant Diagnostic Studies:   Treatments:   Discharge Exam: Blood pressure 136/75, pulse 66, temperature 97.5 F (36.4 C), temperature source Oral, resp. rate 18, SpO2 95.00%. Awake and alert. Oriented and appropriate. Cranial nerve function intact. Motor and sensory function of the extremities normal. Wound clean and dry. Chest and abdomen benign.  Disposition: 01-Home or Self Care     Medication List         aspirin 81 MG tablet  Take 81 mg by mouth daily.     calcium-vitamin D 250-125 MG-UNIT per tablet  Commonly known as:  OSCAL WITH D  Take 1 tablet by mouth 2 (two) times daily.     celecoxib 200 MG capsule  Commonly known as:  CELEBREX  Take 200 mg by mouth 2 (two) times daily.     fenofibrate 145 MG tablet  Commonly known as:  TRICOR  Take 145 mg by mouth daily.     FIBER FORMULA PO  Take 930 mg by mouth 2 (two) times daily. x3 capsules bid     HYDROcodone-acetaminophen 5-325 MG per tablet  Commonly known as:  NORCO/VICODIN  Take 1-2 tablets by mouth every 4 (four) hours as needed for moderate pain.     Liraglutide 18 MG/3ML Sopn  Inject 1.8 mg into the skin daily.     lisinopril 10 MG tablet  Commonly known as:  PRINIVIL,ZESTRIL  Take 10 mg by mouth every evening.     metFORMIN 1000 MG tablet  Commonly known as:  GLUCOPHAGE  Take 1,000 mg by  mouth 2 (two) times daily with a meal.     multivitamin tablet  Take 1 tablet by mouth daily.     omeprazole 20 MG capsule  Commonly known as:  PRILOSEC  Take 20 mg by mouth daily.     PROBIOTIC FORMULA PO  Take 300 mg by mouth every evening.     simvastatin 10 MG tablet  Commonly known as:  ZOCOR  Take 10 mg by mouth at bedtime.     vitamin C 500 MG tablet  Commonly known as:  ASCORBIC ACID  Take 500 mg by mouth daily.           Follow-up Information   Schedule an appointment as soon as possible for a visit with Charlie Pitter, MD.   Specialty:  Neurosurgery   Contact information:   [address] [place] [address] [phone]       Signed: Earnie Larsson A 02/17/2014, 4:22 PM

## 2014-02-17 NOTE — Progress Notes (Signed)
Pt doing well. Pt and wife given D/C instructions with Rx, verbal understanding of teaching was given. Pt D/C'd home via wheelchair @ 1710 with wife per MD order. Pt is stable @ D/C and has no other needs. Holli Humbles, RN

## 2014-02-17 NOTE — Preoperative (Signed)
Beta Blockers   Reason not to administer Beta Blockers:Not Applicable 

## 2014-02-17 NOTE — Discharge Instructions (Signed)
Wound Care Keep incision covered and dry for one week.  If you shower prior to then, cover incision with plastic wrap.  You may remove outer bandage after one week and shower.  Do not put any creams, lotions, or ointments on incision. Leave steri-strips on neck.  They will fall off by themselves. Activity Walk each and every day, increasing distance each day. No lifting greater than 5 lbs.  Avoid excessive neck motion. No driving for 2 weeks; may ride as a passenger locally. If provided with back brace, wear when out of bed.  It is not necessary to wear brace in bed. Diet Resume your normal diet.  Return to Work Will be discussed at you follow up appointment. Call Your Doctor If Any of These Occur Redness, drainage, or swelling at the wound.  Temperature greater than 101 degrees. Severe pain not relieved by pain medication. Incision starts to come apart. Follow Up Appt Call today for appointment in 1-2 weeks ([phone]) or for problems.  If you have any hardware placed in your spine, you will need an x-ray before your appointment.

## 2014-02-17 NOTE — Transfer of Care (Signed)
Immediate Anesthesia Transfer of Care Note  Patient: Matthew Nash  Procedure(s) Performed: Procedure(s) with comments: RIGHT LUMBAR TWO-THREE LAMINECTOMY (Right) - right   Patient Location: PACU  Anesthesia Type:General  Level of Consciousness: awake, alert , lethargic and responds to stimulation  Airway & Oxygen Therapy: Patient Spontanous Breathing and Patient connected to nasal cannula oxygen  Post-op Assessment: Report given to PACU RN and Post -op Vital signs reviewed and stable  Post vital signs: Reviewed and stable  Complications: No apparent anesthesia complications

## 2014-02-17 NOTE — H&P (Signed)
Matthew Nash is an 78 y.o. male.   Chief Complaint: Right leg pain  HPI: Patient is a 78 year old male with severe right lower extremity radicular pain worsened by standing and walking. Workup demonstrates evidence of marked disc degeneration with resultant spondylosis and disc herniation causing marked compression of the thecal sac and right-sided L3 nerve root. Patient is now for decompressive surgery in hopes of improving his symptoms.  Past Medical History  Diagnosis Date  . BPH (benign prostatic hyperplasia)     hx s/p turp  . Arthritis     left knee and right shoulder  . Hyperlipidemia     takes Simvastatin and Tricor daily  . Hypertension     takes Lisinopril daily  . GERD (gastroesophageal reflux disease)     takes Omeprazole daily  . Diabetes mellitus     takes Metformin and Victoza daily  . Joint pain   . Joint swelling   . Chronic back pain     spondylosis  . Melanoma     right hand;basal cell carcinoma  . H/O hiatal hernia   . History of colon polyps   . Diverticulitis   . History of kidney stones   . History of staph food poisoning     many many yrs ago    Past Surgical History  Procedure Laterality Date  . Shoulder arthroscopy w/ rotator cuff repair Left NOV 2010  . Tonsillectomy  AGE 4  . Transurethral resection of prostate  2006  . Cortisone injection    . I&d left knee  1958    states 22 times  . C5 and t1 bone chip removed   1986  . Cholecystectomy  1993  . Right shoulder surgery  2012  . Colonoscopy      History reviewed. No pertinent family history. Social History:  reports that he has quit smoking. He has never used smokeless tobacco. He reports that he drinks alcohol. He reports that he does not use illicit drugs.  Allergies:  Allergies  Allergen Reactions  . Shrimp [Shellfish Allergy] Itching  . Penicillins Rash    Medications Prior to Admission  Medication Sig Dispense Refill  . aspirin 81 MG tablet Take 81 mg by mouth daily.         . calcium-vitamin D (OSCAL WITH D) 250-125 MG-UNIT per tablet Take 1 tablet by mouth 2 (two) times daily.        . celecoxib (CELEBREX) 200 MG capsule Take 200 mg by mouth 2 (two) times daily.       . fenofibrate (TRICOR) 145 MG tablet Take 145 mg by mouth daily.      [name] Kitchen FIBER FORMULA PO Take 930 mg by mouth 2 (two) times daily. x3 capsules bid       . Liraglutide 18 MG/3ML SOPN Inject 1.8 mg into the skin daily.      [name] Kitchen lisinopril (PRINIVIL,ZESTRIL) 10 MG tablet Take 10 mg by mouth every evening.        . metFORMIN (GLUCOPHAGE) 1000 MG tablet Take 1,000 mg by mouth 2 (two) times daily with a meal.      . Multiple Vitamin (MULTIVITAMIN) tablet Take 1 tablet by mouth daily.        [name] Kitchen omeprazole (PRILOSEC) 20 MG capsule Take 20 mg by mouth daily.       . Probiotic Product (PROBIOTIC FORMULA PO) Take 300 mg by mouth every evening.        . simvastatin (ZOCOR) 10 MG tablet Take  10 mg by mouth at bedtime.        . vitamin C (ASCORBIC ACID) 500 MG tablet Take 500 mg by mouth daily.          Results for orders placed during the hospital encounter of 02/17/14 (from the past 48 hour(s))  GLUCOSE, CAPILLARY     Status: Abnormal   Collection Time    02/17/14  8:10 AM      Result Value Ref Range   Glucose-Capillary 117 (*) 70 - 99 mg/dL   No results found.  Review of Systems  Constitutional: Negative.   HENT: Negative.   Eyes: Negative.   Respiratory: Negative.   Cardiovascular: Negative.   Gastrointestinal: Negative.   Genitourinary: Negative.   Musculoskeletal: Negative.   Skin: Negative.   Neurological: Negative.   Endo/Heme/Allergies: Negative.   Psychiatric/Behavioral: Negative.     Blood pressure 145/86, pulse 78, temperature 97.8 F (36.6 C), temperature source Oral, resp. rate 18, SpO2 95.00%. Physical Exam  Constitutional: He is oriented to person, place, and time. He appears well-developed and well-nourished. No distress.  HENT:  Head: Normocephalic and atraumatic.  Right  Ear: External ear normal.  Left Ear: External ear normal.  Nose: Nose normal.  Mouth/Throat: Oropharynx is clear and moist.  Eyes: Conjunctivae and EOM are normal. Pupils are equal, round, and reactive to light. Right eye exhibits no discharge. Left eye exhibits no discharge. No scleral icterus.  Neck: Normal range of motion. Neck supple. No tracheal deviation present. No thyromegaly present.  Cardiovascular: Normal rate, regular rhythm, normal heart sounds and intact distal pulses.  Exam reveals no friction rub.   No murmur heard. Respiratory: Effort normal and breath sounds normal. No respiratory distress. He has no wheezes.  GI: Soft. Bowel sounds are normal. He exhibits no distension. There is no tenderness.  Musculoskeletal: Normal range of motion. He exhibits no edema and no tenderness.  Neurological: He is alert and oriented to person, place, and time. He has normal reflexes. He displays normal reflexes. No cranial nerve deficit. He exhibits normal muscle tone. Coordination normal.  Skin: Skin is warm and dry. No rash noted. He is not diaphoretic. No erythema.  Psychiatric: He has a normal mood and affect. His behavior is normal. Judgment and thought content normal.     Assessment/Plan Right L2-3 spondylosis with radiculopathy. Plan right L2-3 decompressive laminotomy and foraminotomies with microdiscectomy. Risks and benefits have been explained. Patient wishes to proceed.  Stony Stegmann A 02/17/2014, 10:48 AM

## 2014-02-17 NOTE — Anesthesia Procedure Notes (Signed)
Procedure Name: Intubation Date/Time: 02/17/2014 11:15 AM Performed by: Storm Frisk ELLEN-ELIZABETH Pre-anesthesia Checklist: Patient identified, Timeout performed, Emergency Drugs available, Suction available and Patient being monitored Patient Re-evaluated:Patient Re-evaluated prior to inductionOxygen Delivery Method: Circle system utilized Preoxygenation: Pre-oxygenation with 100% oxygen Intubation Type: IV induction Ventilation: Mask ventilation without difficulty Laryngoscope Size: Mac and 3 Grade View: Grade I Tube type: Oral Tube size: 7.5 mm Number of attempts: 1 Airway Equipment and Method: Stylet Placement Confirmation: ETT inserted through vocal cords under direct vision,  positive ETCO2 and breath sounds checked- equal and bilateral Secured at: 23 cm Tube secured with: Tape Dental Injury: Teeth and Oropharynx as per pre-operative assessment

## 2014-02-17 NOTE — Anesthesia Postprocedure Evaluation (Signed)
  Anesthesia Post-op Note  Patient: Matthew Nash  Procedure(s) Performed: Procedure(s) with comments: RIGHT LUMBAR TWO-THREE LAMINECTOMY (Right) - right   Patient Location: PACU  Anesthesia Type:General  Level of Consciousness: awake and alert   Airway and Oxygen Therapy: Patient Spontanous Breathing  Post-op Pain: mild  Post-op Assessment: Post-op Vital signs reviewed  Post-op Vital Signs: stable  Complications: No apparent anesthesia complications

## 2014-02-18 ENCOUNTER — Encounter (HOSPITAL_COMMUNITY): Payer: Self-pay | Admitting: Neurosurgery

## 2014-02-28 DIAGNOSIS — IMO0002 Reserved for concepts with insufficient information to code with codable children: Secondary | ICD-10-CM | POA: Diagnosis not present

## 2014-02-28 DIAGNOSIS — M1710 Unilateral primary osteoarthritis, unspecified knee: Secondary | ICD-10-CM | POA: Diagnosis not present

## 2014-02-28 DIAGNOSIS — E119 Type 2 diabetes mellitus without complications: Secondary | ICD-10-CM | POA: Diagnosis not present

## 2014-03-06 DIAGNOSIS — M1710 Unilateral primary osteoarthritis, unspecified knee: Secondary | ICD-10-CM | POA: Diagnosis not present

## 2014-03-06 DIAGNOSIS — IMO0002 Reserved for concepts with insufficient information to code with codable children: Secondary | ICD-10-CM | POA: Diagnosis not present

## 2014-03-12 DIAGNOSIS — M1710 Unilateral primary osteoarthritis, unspecified knee: Secondary | ICD-10-CM | POA: Diagnosis not present

## 2014-03-12 DIAGNOSIS — IMO0002 Reserved for concepts with insufficient information to code with codable children: Secondary | ICD-10-CM | POA: Diagnosis not present

## 2014-04-23 DIAGNOSIS — R69 Illness, unspecified: Secondary | ICD-10-CM | POA: Diagnosis not present

## 2014-04-23 DIAGNOSIS — E785 Hyperlipidemia, unspecified: Secondary | ICD-10-CM | POA: Diagnosis not present

## 2014-04-23 DIAGNOSIS — E119 Type 2 diabetes mellitus without complications: Secondary | ICD-10-CM | POA: Diagnosis not present

## 2014-04-23 DIAGNOSIS — G589 Mononeuropathy, unspecified: Secondary | ICD-10-CM | POA: Diagnosis not present

## 2014-05-14 DIAGNOSIS — M1710 Unilateral primary osteoarthritis, unspecified knee: Secondary | ICD-10-CM | POA: Diagnosis not present

## 2014-05-15 DIAGNOSIS — E785 Hyperlipidemia, unspecified: Secondary | ICD-10-CM | POA: Diagnosis not present

## 2014-05-15 DIAGNOSIS — E119 Type 2 diabetes mellitus without complications: Secondary | ICD-10-CM | POA: Diagnosis not present

## 2014-05-15 DIAGNOSIS — G589 Mononeuropathy, unspecified: Secondary | ICD-10-CM | POA: Diagnosis not present

## 2014-05-15 DIAGNOSIS — R69 Illness, unspecified: Secondary | ICD-10-CM | POA: Diagnosis not present

## 2014-05-21 DIAGNOSIS — M1710 Unilateral primary osteoarthritis, unspecified knee: Secondary | ICD-10-CM | POA: Diagnosis not present

## 2014-05-29 DIAGNOSIS — IMO0002 Reserved for concepts with insufficient information to code with codable children: Secondary | ICD-10-CM | POA: Diagnosis not present

## 2014-05-29 DIAGNOSIS — M1710 Unilateral primary osteoarthritis, unspecified knee: Secondary | ICD-10-CM | POA: Diagnosis not present

## 2014-06-26 ENCOUNTER — Encounter (HOSPITAL_COMMUNITY): Payer: Self-pay | Admitting: Pharmacy Technician

## 2014-06-26 ENCOUNTER — Other Ambulatory Visit: Payer: Self-pay | Admitting: Orthopedic Surgery

## 2014-07-03 DIAGNOSIS — R350 Frequency of micturition: Secondary | ICD-10-CM | POA: Diagnosis not present

## 2014-07-03 DIAGNOSIS — N390 Urinary tract infection, site not specified: Secondary | ICD-10-CM | POA: Diagnosis not present

## 2014-07-11 ENCOUNTER — Other Ambulatory Visit (HOSPITAL_COMMUNITY): Payer: Self-pay | Admitting: *Deleted

## 2014-07-11 NOTE — Patient Instructions (Addendum)
Matthew Nash  07/11/2014                           YOUR PROCEDURE IS SCHEDULED ON: 07/16/14 at 10:15 am               [place] [facility]TR[facility] AND                            FOLLOW  SIGNS TO SHORT STAY CENTER                 ARRIVE AT SHORT STAY AT: 8:15 AM               CALL THIS NUMBER IF ANY PROBLEMS THE DAY OF SURGERY :               [phone]                                REMEMBER:   Do not eat food or drink liquids AFTER MIDNIGHT                  Take these medicines the morning of surgery with               A SIPS OF WATER :    OMEPRAZOLE / MACROBID     Do not wear jewelry, make-up   Do not wear lotions, powders, or perfumes.   Do not shave legs or underarms 12 hrs. before surgery (men may shave face)  Do not bring valuables to the hospital.  Contacts, dentures or bridgework may not be worn into surgery.  Leave suitcase in the car. After surgery it may be brought to your room.  For patients admitted to the hospital more than one night, checkout time is            11:00 AM                                                       The day of discharge.   Patients discharged the day of surgery will not be allowed to drive home.            If going home same day of surgery, must have someone stay with you              FIRST 24 hrs at home and arrange for some one to drive you              home from hospital.   ________________________________________________________________________                                                                        [place]  Before surgery, you can play an important role.  Because skin is not sterile, your skin needs to be as free of germs as possible.  You can reduce the number of germs  on your skin by washing with CHG (chlorahexidine gluconate) soap before surgery.  CHG is an antiseptic cleaner which kills germs and bonds with the skin to continue killing germs even after  washing. Please DO NOT use if you have an allergy to CHG or antibacterial soaps.  If your skin becomes reddened/irritated stop using the CHG and inform your nurse when you arrive at Short Stay. Do not shave (including legs and underarms) for at least 48 hours prior to the first CHG shower.  You may shave your face. Please follow these instructions carefully:   1.  Shower with CHG Soap the night before surgery and the  morning of Surgery.   2.  If you choose to wash your hair, wash your hair first as usual with your  normal  Shampoo.   3.  After you shampoo, rinse your hair and body thoroughly to remove the  shampoo.                                         4.  Use CHG as you would any other liquid soap.  You can apply chg directly  to the skin and wash . Gently wash with scrungie or clean wascloth    5.  Apply the CHG Soap to your body ONLY FROM THE NECK DOWN.   Do not use on open                           Wound or open sores. Avoid contact with eyes, ears mouth and genitals (private parts).                        Genitals (private parts) with your normal soap.              6.  Wash thoroughly, paying special attention to the area where your surgery  will be performed.   7.  Thoroughly rinse your body with warm water from the neck down.   8.  DO NOT shower/wash with your normal soap after using and rinsing off  the CHG Soap .                9.  Pat yourself dry with a clean towel.             10.  Wear clean pajamas.             11.  Place clean sheets on your bed the night of your first shower and do not  sleep with pets.  Day of Surgery : Do not apply any lotions/deodorants the morning of surgery.  Please wear clean clothes to the hospital/surgery center.  FAILURE TO FOLLOW THESE INSTRUCTIONS MAY RESULT IN THE CANCELLATION OF YOUR SURGERY    PATIENT  SIGNATURE_________________________________  ______________________________________________________________________    Matthew Nash  An incentive spirometer is a tool that can help keep your lungs clear and active. This tool measures how well you are filling your lungs with each breath. Taking long deep breaths may help reverse or decrease the chance of developing breathing (pulmonary) problems (especially infection) following:  A long period of time when you are unable to move or be active. BEFORE THE PROCEDURE   If the spirometer includes an indicator to show your best effort, your nurse or respiratory therapist will set it to a  desired goal.  If possible, sit up straight or lean slightly forward. Try not to slouch.  Hold the incentive spirometer in an upright position. INSTRUCTIONS FOR USE  1. Sit on the edge of your bed if possible, or sit up as far as you can in bed or on a chair. 2. Hold the incentive spirometer in an upright position. 3. Breathe out normally. 4. Place the mouthpiece in your mouth and seal your lips tightly around it. 5. Breathe in slowly and as deeply as possible, raising the piston or the ball toward the top of the column. 6. Hold your breath for 3-5 seconds or for as long as possible. Allow the piston or ball to fall to the bottom of the column. 7. Remove the mouthpiece from your mouth and breathe out normally. 8. Rest for a few seconds and repeat Steps 1 through 7 at least 10 times every 1-2 hours when you are awake. Take your time and take a few normal breaths between deep breaths. 9. The spirometer may include an indicator to show your best effort. Use the indicator as a goal to work toward during each repetition. 10. After each set of 10 deep breaths, practice coughing to be sure your lungs are clear. If you have an incision (the cut made at the time of surgery), support your incision when coughing by placing a pillow or rolled up towels firmly against  it. Once you are able to get out of bed, walk around indoors and cough well. You may stop using the incentive spirometer when instructed by your caregiver.  RISKS AND COMPLICATIONS  Take your time so you do not get dizzy or light-headed.  If you are in pain, you may need to take or ask for pain medication before doing incentive spirometry. It is harder to take a deep breath if you are having pain. AFTER USE  Rest and breathe slowly and easily.  It can be helpful to keep track of a log of your progress. Your caregiver can provide you with a simple table to help with this. If you are using the spirometer at home, follow these instructions: [place] IF:   You are having difficultly using the spirometer.  You have trouble using the spirometer as often as instructed.  Your pain medication is not giving enough relief while using the spirometer.  You develop fever of 100.5 F (38.1 C) or higher. SEEK IMMEDIATE MEDICAL CARE IF:   You cough up bloody sputum that had not been present before.  You develop fever of 102 F (38.9 C) or greater.  You develop worsening pain at or near the incision site. MAKE SURE YOU:   Understand these instructions.  Will watch your condition.  Will get help right away if you are not doing well or get worse. Document Released: 04/10/2007 Document Revised: 02/20/2012 Document Reviewed: 06/11/2007 [facility] Patient Information 2014 [place], [place].   ________________________________________________________________________

## 2014-07-14 ENCOUNTER — Encounter (HOSPITAL_COMMUNITY)
Admission: RE | Admit: 2014-07-14 | Discharge: 2014-07-14 | Disposition: A | Discharge status: Home / Self Care | Payer: Medicare Other | Source: Ambulatory Visit | Attending: Orthopedic Surgery | Admitting: Orthopedic Surgery

## 2014-07-14 ENCOUNTER — Encounter (HOSPITAL_COMMUNITY): Payer: Self-pay

## 2014-07-14 DIAGNOSIS — Y9319 Activity, other involving water and watercraft: Secondary | ICD-10-CM | POA: Diagnosis not present

## 2014-07-14 DIAGNOSIS — Z7982 Long term (current) use of aspirin: Secondary | ICD-10-CM | POA: Diagnosis not present

## 2014-07-14 DIAGNOSIS — Y998 Other external cause status: Secondary | ICD-10-CM | POA: Diagnosis not present

## 2014-07-14 DIAGNOSIS — G589 Mononeuropathy, unspecified: Secondary | ICD-10-CM | POA: Diagnosis not present

## 2014-07-14 DIAGNOSIS — Z85820 Personal history of malignant melanoma of skin: Secondary | ICD-10-CM | POA: Diagnosis not present

## 2014-07-14 DIAGNOSIS — I10 Essential (primary) hypertension: Secondary | ICD-10-CM | POA: Diagnosis not present

## 2014-07-14 DIAGNOSIS — S83289A Other tear of lateral meniscus, current injury, unspecified knee, initial encounter: Secondary | ICD-10-CM | POA: Diagnosis not present

## 2014-07-14 DIAGNOSIS — N400 Enlarged prostate without lower urinary tract symptoms: Secondary | ICD-10-CM | POA: Diagnosis not present

## 2014-07-14 DIAGNOSIS — E119 Type 2 diabetes mellitus without complications: Secondary | ICD-10-CM | POA: Diagnosis not present

## 2014-07-14 DIAGNOSIS — W1789XA Other fall from one level to another, initial encounter: Secondary | ICD-10-CM | POA: Diagnosis not present

## 2014-07-14 DIAGNOSIS — Y9289 Other specified places as the place of occurrence of the external cause: Secondary | ICD-10-CM | POA: Diagnosis not present

## 2014-07-14 DIAGNOSIS — M959 Acquired deformity of musculoskeletal system, unspecified: Secondary | ICD-10-CM | POA: Diagnosis not present

## 2014-07-14 DIAGNOSIS — G8929 Other chronic pain: Secondary | ICD-10-CM | POA: Diagnosis not present

## 2014-07-14 DIAGNOSIS — E785 Hyperlipidemia, unspecified: Secondary | ICD-10-CM | POA: Diagnosis not present

## 2014-07-14 DIAGNOSIS — M549 Dorsalgia, unspecified: Secondary | ICD-10-CM | POA: Diagnosis not present

## 2014-07-14 DIAGNOSIS — IMO0002 Reserved for concepts with insufficient information to code with codable children: Secondary | ICD-10-CM | POA: Diagnosis not present

## 2014-07-14 DIAGNOSIS — K219 Gastro-esophageal reflux disease without esophagitis: Secondary | ICD-10-CM | POA: Diagnosis not present

## 2014-07-14 DIAGNOSIS — M1289 Other specific arthropathies, not elsewhere classified, multiple sites: Secondary | ICD-10-CM | POA: Diagnosis not present

## 2014-07-14 HISTORY — DX: Polyneuropathy, unspecified: G62.9

## 2014-07-14 HISTORY — DX: Inflammatory disease of prostate, unspecified: N41.9

## 2014-07-14 HISTORY — DX: Diarrhea, unspecified: R19.7

## 2014-07-14 LAB — BASIC METABOLIC PANEL
ANION GAP: 12 (ref 5–15)
BUN: 18 mg/dL (ref 6–23)
CO2: 24 meq/L (ref 19–32)
CREATININE: 0.94 mg/dL (ref 0.50–1.35)
Calcium: 10.4 mg/dL (ref 8.4–10.5)
Chloride: 107 mEq/L (ref 96–112)
GFR calc Af Amer: >90 mL/min (ref >90)
GFR calc non Af Amer: 79 mL/min — ABNORMAL LOW (ref >90)
GLUCOSE: 103 mg/dL — AB (ref 70–99)
Potassium: 4.9 mEq/L (ref 3.7–5.3)
Sodium: 143 mEq/L (ref 137–147)

## 2014-07-14 LAB — CBC
HCT: 41.5 % (ref 39.0–52.0)
Hemoglobin: 13.9 g/dL (ref 13.0–17.0)
MCH: 29.1 pg (ref 26.0–34.0)
MCHC: 33.5 g/dL (ref 30.0–36.0)
MCV: 87 fL (ref 78.0–100.0)
Platelets: 371 10*3/uL (ref 150–400)
RBC: 4.77 MIL/uL (ref 4.22–5.81)
RDW: 13.2 % (ref 11.5–15.5)
WBC: 9.1 10*3/uL (ref 4.0–10.5)

## 2014-07-14 NOTE — Progress Notes (Signed)
07/14/14 0923  OBSTRUCTIVE SLEEP APNEA  Have you ever been diagnosed with sleep apnea through a sleep study? No  Do you snore loudly (loud enough to be heard through closed doors)?  1  Do you often feel tired, fatigued, or sleepy during the daytime? 1  Has anyone observed you stop breathing during your sleep? 0  Do you have, or are you being treated for high blood pressure? 1  BMI more than 35 kg/m2? 0  Age over 76 years old? 1  Neck circumference greater than 40 cm/16 inches? 1  Gender: 1  Obstructive Sleep Apnea Score 6  Score 4 or greater  Results sent to PCP

## 2014-07-14 NOTE — Progress Notes (Signed)
Pt arrived to admitting this am and had profuse incontinent diarrhea. When questioned about having diarrhea, pt said he has had a few "loose stools"  but not diarrhea the past week and feels this was all due to taking Macrobid for prostate infection since tues 07/09/14. Dr. Wynelle Link made aware and pt will be questioned on arrival of surgery day. Pt instructed to call Dr, [name]'s office if he starts feeling sick, fever, or continued diarrhea.

## 2014-07-15 NOTE — Progress Notes (Signed)
Contacted pt today - pt states he is feeling better and no more diarrhea episodes.  Infection control nurse contacted. States pt does not need isolation if diarrhea has stopped. Short stay nurse instructed to question pt on arrival in AM.

## 2014-07-16 ENCOUNTER — Encounter (HOSPITAL_COMMUNITY)
Admission: RE | Disposition: A | Discharge status: Home / Self Care | Payer: Self-pay | Source: Ambulatory Visit | Attending: Orthopedic Surgery

## 2014-07-16 ENCOUNTER — Encounter (HOSPITAL_COMMUNITY): Payer: Medicare Other | Admitting: Anesthesiology

## 2014-07-16 ENCOUNTER — Encounter (HOSPITAL_COMMUNITY): Payer: Self-pay | Admitting: *Deleted

## 2014-07-16 ENCOUNTER — Ambulatory Visit (HOSPITAL_COMMUNITY)
Admission: RE | Admit: 2014-07-16 | Discharge: 2014-07-16 | Disposition: A | Discharge status: Home / Self Care | Payer: Medicare Other | Source: Ambulatory Visit | Attending: Orthopedic Surgery | Admitting: Orthopedic Surgery

## 2014-07-16 ENCOUNTER — Ambulatory Visit (HOSPITAL_COMMUNITY): Payer: Medicare Other | Admitting: Anesthesiology

## 2014-07-16 DIAGNOSIS — Y998 Other external cause status: Secondary | ICD-10-CM | POA: Insufficient documentation

## 2014-07-16 DIAGNOSIS — I10 Essential (primary) hypertension: Secondary | ICD-10-CM | POA: Insufficient documentation

## 2014-07-16 DIAGNOSIS — Y9319 Activity, other involving water and watercraft: Secondary | ICD-10-CM | POA: Insufficient documentation

## 2014-07-16 DIAGNOSIS — G589 Mononeuropathy, unspecified: Secondary | ICD-10-CM | POA: Insufficient documentation

## 2014-07-16 DIAGNOSIS — S83241D Other tear of medial meniscus, current injury, right knee, subsequent encounter: Secondary | ICD-10-CM

## 2014-07-16 DIAGNOSIS — S83289A Other tear of lateral meniscus, current injury, unspecified knee, initial encounter: Secondary | ICD-10-CM | POA: Insufficient documentation

## 2014-07-16 DIAGNOSIS — M1289 Other specific arthropathies, not elsewhere classified, multiple sites: Secondary | ICD-10-CM | POA: Insufficient documentation

## 2014-07-16 DIAGNOSIS — E785 Hyperlipidemia, unspecified: Secondary | ICD-10-CM | POA: Insufficient documentation

## 2014-07-16 DIAGNOSIS — M549 Dorsalgia, unspecified: Secondary | ICD-10-CM | POA: Insufficient documentation

## 2014-07-16 DIAGNOSIS — K219 Gastro-esophageal reflux disease without esophagitis: Secondary | ICD-10-CM | POA: Insufficient documentation

## 2014-07-16 DIAGNOSIS — Z7982 Long term (current) use of aspirin: Secondary | ICD-10-CM | POA: Insufficient documentation

## 2014-07-16 DIAGNOSIS — Y9289 Other specified places as the place of occurrence of the external cause: Secondary | ICD-10-CM | POA: Insufficient documentation

## 2014-07-16 DIAGNOSIS — M959 Acquired deformity of musculoskeletal system, unspecified: Secondary | ICD-10-CM | POA: Diagnosis not present

## 2014-07-16 DIAGNOSIS — E119 Type 2 diabetes mellitus without complications: Secondary | ICD-10-CM | POA: Insufficient documentation

## 2014-07-16 DIAGNOSIS — N400 Enlarged prostate without lower urinary tract symptoms: Secondary | ICD-10-CM | POA: Diagnosis not present

## 2014-07-16 DIAGNOSIS — W1789XA Other fall from one level to another, initial encounter: Secondary | ICD-10-CM | POA: Insufficient documentation

## 2014-07-16 DIAGNOSIS — G8929 Other chronic pain: Secondary | ICD-10-CM | POA: Insufficient documentation

## 2014-07-16 DIAGNOSIS — Z85820 Personal history of malignant melanoma of skin: Secondary | ICD-10-CM | POA: Insufficient documentation

## 2014-07-16 DIAGNOSIS — S83249A Other tear of medial meniscus, current injury, unspecified knee, initial encounter: Secondary | ICD-10-CM

## 2014-07-16 DIAGNOSIS — IMO0002 Reserved for concepts with insufficient information to code with codable children: Secondary | ICD-10-CM | POA: Insufficient documentation

## 2014-07-16 HISTORY — PX: KNEE ARTHROSCOPY: SHX127

## 2014-07-16 LAB — GLUCOSE, CAPILLARY
GLUCOSE-CAPILLARY: 118 mg/dL — AB (ref 70–99)
GLUCOSE-CAPILLARY: 111 mg/dL — AB (ref 70–99)

## 2014-07-16 SURGERY — ARTHROSCOPY, KNEE
Anesthesia: General | Site: Knee | Laterality: Right

## 2014-07-16 MED ORDER — CEFAZOLIN SODIUM-DEXTROSE 2-3 GM-% IV SOLR
INTRAVENOUS | Status: AC
  Filled 2014-07-16: qty 50

## 2014-07-16 MED ORDER — LIDOCAINE HCL (CARDIAC) 20 MG/ML IV SOLN
INTRAVENOUS | Status: DC | PRN
  Administered 2014-07-16: 80 mg via INTRAVENOUS

## 2014-07-16 MED ORDER — ONDANSETRON HCL 4 MG/2ML IJ SOLN
INTRAMUSCULAR | Status: AC
  Filled 2014-07-16: qty 2

## 2014-07-16 MED ORDER — ACETAMINOPHEN 10 MG/ML IV SOLN
1000.0000 mg | Freq: Once | INTRAVENOUS | Status: AC
  Administered 2014-07-16: 1000 mg via INTRAVENOUS
  Filled 2014-07-16: qty 100

## 2014-07-16 MED ORDER — KETOROLAC TROMETHAMINE 15 MG/ML IJ SOLN
INTRAMUSCULAR | Status: AC
  Filled 2014-07-16: qty 1

## 2014-07-16 MED ORDER — LACTATED RINGERS IV SOLN
INTRAVENOUS | Status: DC
  Administered 2014-07-16: 1000 mL via INTRAVENOUS
  Administered 2014-07-16 (×2): via INTRAVENOUS

## 2014-07-16 MED ORDER — PROMETHAZINE HCL 25 MG/ML IJ SOLN: 6.2500 mg | INTRAMUSCULAR | Status: DC | PRN

## 2014-07-16 MED ORDER — HYDROCODONE-ACETAMINOPHEN 5-325 MG PO TABS: 1.0000 | ORAL_TABLET | Freq: Four times a day (QID) | ORAL | Status: DC | PRN

## 2014-07-16 MED ORDER — DEXTROSE 5 % IV SOLN
500.0000 mg | Freq: Once | INTRAVENOUS | Status: AC
  Administered 2014-07-16: 500 mg via INTRAVENOUS
  Filled 2014-07-16: qty 5

## 2014-07-16 MED ORDER — BUPIVACAINE-EPINEPHRINE 0.25% -1:200000 IJ SOLN
INTRAMUSCULAR | Status: DC | PRN
  Administered 2014-07-16: 10 mL

## 2014-07-16 MED ORDER — KETOROLAC TROMETHAMINE 15 MG/ML IJ SOLN
15.0000 mg | Freq: Once | INTRAMUSCULAR | Status: AC
  Administered 2014-07-16: 15 mg via INTRAVENOUS

## 2014-07-16 MED ORDER — ONDANSETRON HCL 4 MG/2ML IJ SOLN
INTRAMUSCULAR | Status: DC | PRN
  Administered 2014-07-16: 4 mg via INTRAVENOUS

## 2014-07-16 MED ORDER — CHLORHEXIDINE GLUCONATE 4 % EX LIQD: 60.0000 mL | Freq: Once | CUTANEOUS | Status: DC

## 2014-07-16 MED ORDER — LACTATED RINGERS IR SOLN
Status: DC | PRN
  Administered 2014-07-16: 4000 mL

## 2014-07-16 MED ORDER — FENTANYL CITRATE 0.05 MG/ML IJ SOLN
INTRAMUSCULAR | Status: DC | PRN
  Administered 2014-07-16 (×4): 50 ug via INTRAVENOUS

## 2014-07-16 MED ORDER — SODIUM CHLORIDE 0.9 % IR SOLN
Status: DC | PRN
  Administered 2014-07-16: 3000 mL

## 2014-07-16 MED ORDER — BUPIVACAINE-EPINEPHRINE (PF) 0.25% -1:200000 IJ SOLN
INTRAMUSCULAR | Status: AC
  Filled 2014-07-16: qty 30

## 2014-07-16 MED ORDER — CEFAZOLIN SODIUM-DEXTROSE 2-3 GM-% IV SOLR
2.0000 g | INTRAVENOUS | Status: AC
  Administered 2014-07-16: 2 g via INTRAVENOUS

## 2014-07-16 MED ORDER — DEXAMETHASONE SODIUM PHOSPHATE 10 MG/ML IJ SOLN
10.0000 mg | Freq: Once | INTRAMUSCULAR | Status: AC
  Administered 2014-07-16: 10 mg via INTRAVENOUS

## 2014-07-16 MED ORDER — SODIUM CHLORIDE 0.9 % IV SOLN: INTRAVENOUS | Status: DC

## 2014-07-16 MED ORDER — FENTANYL CITRATE 0.05 MG/ML IJ SOLN
INTRAMUSCULAR | Status: AC
  Filled 2014-07-16: qty 2

## 2014-07-16 MED ORDER — LIDOCAINE HCL (CARDIAC) 20 MG/ML IV SOLN
INTRAVENOUS | Status: AC
  Filled 2014-07-16: qty 5

## 2014-07-16 MED ORDER — PROPOFOL 10 MG/ML IV BOLUS
INTRAVENOUS | Status: AC
  Filled 2014-07-16: qty 20

## 2014-07-16 MED ORDER — METHOCARBAMOL 500 MG PO TABS: 500.0000 mg | ORAL_TABLET | Freq: Four times a day (QID) | ORAL | Status: DC

## 2014-07-16 MED ORDER — PROPOFOL 10 MG/ML IV BOLUS
INTRAVENOUS | Status: DC | PRN
  Administered 2014-07-16: 200 mg via INTRAVENOUS

## 2014-07-16 MED ORDER — BUPIVACAINE LIPOSOME 1.3 % IJ SUSP
20.0000 mL | Freq: Once | INTRAMUSCULAR | Status: DC
  Filled 2014-07-16: qty 20

## 2014-07-16 MED ORDER — FENTANYL CITRATE 0.05 MG/ML IJ SOLN
25.0000 ug | INTRAMUSCULAR | Status: DC | PRN
Start: 2014-07-16 — End: 2014-07-16

## 2014-07-16 MED ORDER — DEXAMETHASONE SODIUM PHOSPHATE 10 MG/ML IJ SOLN
INTRAMUSCULAR | Status: AC
  Filled 2014-07-16: qty 1

## 2014-07-16 MED ORDER — KETOROLAC TROMETHAMINE 30 MG/ML IJ SOLN: 15.0000 mg | Freq: Once | INTRAMUSCULAR | Status: DC | PRN

## 2014-07-16 SURGICAL SUPPLY — 27 items
BANDAGE ELASTIC 6 VELCRO ST LF (GAUZE/BANDAGES/DRESSINGS) ×2 IMPLANT
BLADE 4.2CUDA (BLADE) ×3 IMPLANT
CLOTH BEACON ORANGE TIMEOUT ST (SAFETY) ×3 IMPLANT
COUNTER NEEDLE 20 DBL MAG RED (NEEDLE) ×3 IMPLANT
CUFF TOURN SGL QUICK 34 (TOURNIQUET CUFF) ×3
CUFF TRNQT CYL 34X4X40X1 (TOURNIQUET CUFF) ×1 IMPLANT
DRAPE U-SHAPE 47X51 STRL (DRAPES) ×3 IMPLANT
DRSG EMULSION OIL 3X3 NADH (GAUZE/BANDAGES/DRESSINGS) ×3 IMPLANT
DURAPREP 26ML APPLICATOR (WOUND CARE) ×3 IMPLANT
GAUZE XEROFORM 4X4 STRL (GAUZE/BANDAGES/DRESSINGS) ×2 IMPLANT
GLOVE BIO SURGEON STRL SZ8 (GLOVE) ×3 IMPLANT
GLOVE BIOGEL PI IND STRL 8 (GLOVE) ×1 IMPLANT
GLOVE BIOGEL PI INDICATOR 8 (GLOVE) ×2
GOWN STRL REUS W/TWL LRG LVL3 (GOWN DISPOSABLE) ×3 IMPLANT
MANIFOLD NEPTUNE II (INSTRUMENTS) ×3 IMPLANT
PACK ARTHROSCOPY WL (CUSTOM PROCEDURE TRAY) ×3 IMPLANT
PACK ICE MAXI GEL EZY WRAP (MISCELLANEOUS) ×9 IMPLANT
PADDING CAST ABS 6INX4YD NS (CAST SUPPLIES) ×4
PADDING CAST ABS COTTON 6X4 NS (CAST SUPPLIES) IMPLANT
PADDING CAST COTTON 6X4 STRL (CAST SUPPLIES) ×4 IMPLANT
POSITIONER SURGICAL ARM (MISCELLANEOUS) ×3 IMPLANT
SET ARTHROSCOPY TUBING (MISCELLANEOUS) ×3
SET ARTHROSCOPY TUBING LN (MISCELLANEOUS) ×1 IMPLANT
SUT ETHILON 4 0 PS 2 18 (SUTURE) ×3 IMPLANT
TOWEL OR 17X26 10 PK STRL BLUE (TOWEL DISPOSABLE) ×3 IMPLANT
WAND 90 DEG TURBOVAC W/CORD (SURGICAL WAND) ×2 IMPLANT
WRAP KNEE MAXI GEL POST OP (GAUZE/BANDAGES/DRESSINGS) ×3 IMPLANT

## 2014-07-16 NOTE — Transfer of Care (Signed)
Immediate Anesthesia Transfer of Care Note  Patient: Matthew Nash  Procedure(s) Performed: Procedure(s): RIGHT ARTHROSCOPY KNEE WITH Medial and Lateral DEBRIDEMENT and chondroplasty (Right)  Patient Location: PACU  Anesthesia Type:General  Level of Consciousness: awake, alert  and oriented  Airway & Oxygen Therapy: Patient Spontanous Breathing and Patient connected to face mask oxygen  Post-op Assessment: Report given to PACU RN and Post -op Vital signs reviewed and stable  Post vital signs: Reviewed and stable  Complications: No apparent anesthesia complications

## 2014-07-16 NOTE — Op Note (Signed)
Preoperative diagnosis-  Right knee medial and lateral meniscal tears  Postoperative diagnosis Right- knee medial and lateral meniscal tear plus  Medial femoral chondral defect  Procedure- Right knee arthroscopy with medial and lateral  meniscal debridement and chondroplasty   Surgeon- Dione Plover. Illias Pantano, MD  Anesthesia-General  EBL-  Minimal  Complications- None  Condition- PACU - hemodynamically stable.  Brief clinical note- -Matthew Nash is a 78 y.o.  male with a several month history of right knee pain and mechanical symptoms. Exam and history suggested medial meniscal tear confirmed by MRI. The patient presents now for arthroscopy and debridement   Procedure in detail -       After successful administration of General anesthetic, a tourmiquet is placed high on the Right  thigh and the Right lower extremity is prepped and draped in the usual sterile fashion. Time out is performed by the surgical team. Standard superomedial and inferolateral portal sites are marked and incisions made with an 11 blade. The inflow cannula is passed through the superomedial portal and camera through the inferolateral portal and inflow is initiated. Arthroscopic visualization proceeds.      The undersurface of the patella and trochlea are visualized and there is exposed bone on both surfaces with no unstable cartilage lesions. The medial and lateral gutters are visualized and there are  no loose bodies. Flexion and valgus force is applied to the knee and the medial compartment is entered. A spinal needle is passed into the joint through the site marked for the inferomedial portal. A small incision is made and the dilator passed into the joint. The findings for the medial compartment are unstable tear of body and posterior horn of medial meniscus with unstable flap posteriorly and 1 x 2 cm chondral defect medial femoral condyle . The tear is debrided to a stable base with baskets and a shaver and sealed off with  the Arthrocare. The shaver is used to debride the unstable cartilage to a stable cartilaginous base with stable edges. It is probed and found to be stable.    The intercondylar notch is visualized and the ACL appears degenerative bu intact. The lateral compartment is entered and the findings are tear of body and anterior horn lateral meniscus . The tear is debrided to a stable base with baskets and a shaver and sealed off with the Arthrocare.  It is probed and found to be stable. There is grade II chondromalacia lateral compartment but no unstable cartilage defects.     The joint is again inspected and there are no other tears, defects or loose bodies identified. The arthroscopic equipment is then removed from the inferior portals which are closed with interrupted 4-0 nylon. 20 ml of .25% Marcaine with epinephrine are injected through the inflow cannula and the cannula is then removed and the portal closed with nylon. The incisions are cleaned and dried and a bulky sterile dressing is applied. The patient is then awakened and transported to recovery in stable condition.   07/16/2014, 10:57 AM

## 2014-07-16 NOTE — Discharge Instructions (Signed)

## 2014-07-16 NOTE — H&P (Signed)
CC- Matthew Nash is a 78 y.o. male who presents with right knee pain.  HPI- . Knee Pain: Patient presents with knee pain involving the  right knee. Onset of the symptoms was several months ago. Inciting event: fall down fishing bank. Current symptoms include giving out, pain located medially and laterally, stiffness and swelling. Pain is aggravated by going up and down stairs, lateral movements, pivoting, rising after sitting and walking.  Patient has had prior knee problems. Evaluation to date: MRI: abnormal medial and lateral meniscal tears. Treatment to date: rest.  Past Medical History  Diagnosis Date  . BPH (benign prostatic hyperplasia)     hx s/p turp  . Arthritis     left knee and right shoulder  . Hyperlipidemia     takes Simvastatin and Tricor daily  . Hypertension     takes Lisinopril daily  . GERD (gastroesophageal reflux disease)     takes Omeprazole daily  . Diabetes mellitus     takes Metformin and Victoza daily  . Joint pain     both knees  . Joint swelling     rt knee  . Chronic back pain     spondylosis  . Melanoma     right hand;basal cell carcinoma  . H/O hiatal hernia   . History of colon polyps   . Diverticulitis   . History of kidney stones   . Neuropathy   . Hepatitis     with Mono and jaundice 1960  . Prostate infection     currently taking macrobid  . Diarrhea     x 1 today    Past Surgical History  Procedure Laterality Date  . Shoulder arthroscopy w/ rotator cuff repair Left NOV 2010  . Tonsillectomy  AGE 54  . Transurethral resection of prostate  2006  . Cortisone injection    . I&d left knee  1958    states 22 times  . C5 and t1 bone chip removed   1986  . Cholecystectomy  1993  . Right shoulder surgery  2012  . Colonoscopy    . Lumbar laminectomy/decompression microdiscectomy Right 02/17/2014    Procedure: RIGHT LUMBAR TWO-THREE LAMINECTOMY;  Surgeon: Charlie Pitter, MD;  Location: [facility];  Service: Neurosurgery;  Laterality:  Right;  right     Prior to Admission medications   Medication Sig Start Date End Date Taking? Authorizing Provider  aspirin 81 MG tablet Take 81 mg by mouth daily.      Historical Provider, MD  calcium-vitamin D (OSCAL WITH D) 250-125 MG-UNIT per tablet Take 1 tablet by mouth 2 (two) times daily.      Historical Provider, MD  celecoxib (CELEBREX) 200 MG capsule Take 200 mg by mouth daily.     Historical Provider, MD  fenofibrate (TRICOR) 145 MG tablet Take 145 mg by mouth every evening.     Historical Provider, MD  Liraglutide 18 MG/3ML SOPN Inject 1.8 mg into the skin daily.    Historical Provider, MD  lisinopril (PRINIVIL,ZESTRIL) 10 MG tablet Take 10 mg by mouth every morning.     Historical Provider, MD  metFORMIN (GLUCOPHAGE) 1000 MG tablet Take 1,000 mg by mouth 2 (two) times daily with a meal.    Historical Provider, MD  Multiple Vitamin (MULTIVITAMIN) tablet Take 1 tablet by mouth daily.      Historical Provider, MD  nitrofurantoin, macrocrystal-monohydrate, (MACROBID) 100 MG capsule Take 100 mg by mouth 2 (two) times daily.    Historical Provider,  MD  omeprazole (PRILOSEC) 20 MG capsule Take 20 mg by mouth daily.     Historical Provider, MD  polycarbophil (FIBERCON) 625 MG tablet Take 1,875 mg by mouth 2 (two) times daily.    Historical Provider, MD  Probiotic Product (PROBIOTIC FORMULA PO) Take 300 mg by mouth daily.     Historical Provider, MD  simvastatin (ZOCOR) 10 MG tablet Take 10 mg by mouth at bedtime.      Historical Provider, MD  vitamin C (ASCORBIC ACID) 500 MG tablet Take 500 mg by mouth daily.      Historical Provider, MD   KNEE EXAM antalgic gait, soft tissue tenderness over medial and lateral joint lines, no effusion, negative drawer sign, collateral ligaments intact  Physical Examination: General appearance - alert, well appearing, and in no distress Mental status - alert, oriented to person, place, and time Chest - clear to auscultation, no wheezes, rales or  rhonchi, symmetric air entry Heart - normal rate, regular rhythm, normal S1, S2, no murmurs, rubs, clicks or gallops Abdomen - soft, nontender, nondistended, no masses or organomegaly Neurological - alert, oriented, normal speech, no focal findings or movement disorder noted   Asessment/Plan--- Right knee medial meniscal tear- - Plan reft knee arthroscopy with meniscal debridement. Procedure risks and potential comps discussed with patient who elects to proceed. Goals are decreased pain and increased function with a high likelihood of achieving both

## 2014-07-16 NOTE — Anesthesia Preprocedure Evaluation (Signed)
Anesthesia Evaluation  Patient identified by MRN, date of birth, ID band Patient awake    Reviewed: Allergy & Precautions, H&P , NPO status , Patient's Chart, lab work & pertinent test results  Airway Mallampati: II TM Distance: >3 FB Neck ROM: Full    Dental no notable dental hx.    Pulmonary neg pulmonary ROS, former smoker,  breath sounds clear to auscultation  Pulmonary exam normal       Cardiovascular hypertension, Pt. on medications Rhythm:Regular Rate:Normal     Neuro/Psych negative neurological ROS  negative psych ROS   GI/Hepatic Neg liver ROS, GERD-  Medicated,  Endo/Other  diabetes, Oral Hypoglycemic Agents  Renal/GU negative Renal ROS  negative genitourinary   Musculoskeletal negative musculoskeletal ROS (+)   Abdominal   Peds negative pediatric ROS (+)  Hematology negative hematology ROS (+)   Anesthesia Other Findings   Reproductive/Obstetrics negative OB ROS                           Anesthesia Physical Anesthesia Plan  ASA: II  Anesthesia Plan: General   Post-op Pain Management:    Induction: Intravenous  Airway Management Planned: LMA  Additional Equipment:   Intra-op Plan:   Post-operative Plan: Extubation in OR  Informed Consent: I have reviewed the patients History and Physical, chart, labs and discussed the procedure including the risks, benefits and alternatives for the proposed anesthesia with the patient or authorized representative who has indicated his/her understanding and acceptance.   Dental advisory given  Plan Discussed with: CRNA and Surgeon  Anesthesia Plan Comments:         Anesthesia Quick Evaluation

## 2014-07-16 NOTE — Anesthesia Postprocedure Evaluation (Signed)
  Anesthesia Post-op Note  Patient: Matthew Nash  Procedure(s) Performed: Procedure(s) (LRB): RIGHT ARTHROSCOPY KNEE WITH Medial and Lateral DEBRIDEMENT and chondroplasty (Right)  Patient Location: PACU  Anesthesia Type: General  Level of Consciousness: awake and alert   Airway and Oxygen Therapy: Patient Spontanous Breathing  Post-op Pain: mild  Post-op Assessment: Post-op Vital signs reviewed, Patient's Cardiovascular Status Stable, Respiratory Function Stable, Patent Airway and No signs of Nausea or vomiting  Last Vitals:  Filed Vitals:   07/16/14 1201  BP: 131/69  Pulse: 75  Temp:   Resp: 16    Post-op Vital Signs: stable   Complications: No apparent anesthesia complications

## 2014-07-16 NOTE — Interval H&P Note (Signed)
History and Physical Interval Note:  07/16/2014 9:57 AM  Matthew Nash  has presented today for surgery, with the diagnosis of RIGHT KNEE MEDIAL MENISCAL TEAR  The various methods of treatment have been discussed with the patient and family. After consideration of risks, benefits and other options for treatment, the patient has consented to  Procedure(s): RIGHT ARTHROSCOPY KNEE WITH DEBRIDEMENT (Right) as a surgical intervention .  The patient's history has been reviewed, patient examined, no change in status, stable for surgery.  I have reviewed the patient's chart and labs.  Questions were answered to the patient's satisfaction.     Gearlean Alf

## 2014-07-17 ENCOUNTER — Encounter (HOSPITAL_COMMUNITY): Payer: Self-pay | Admitting: Orthopedic Surgery

## 2014-08-23 DIAGNOSIS — Z23 Encounter for immunization: Secondary | ICD-10-CM | POA: Diagnosis not present

## 2014-09-11 DIAGNOSIS — N3941 Urge incontinence: Secondary | ICD-10-CM | POA: Diagnosis not present

## 2014-09-11 DIAGNOSIS — E119 Type 2 diabetes mellitus without complications: Secondary | ICD-10-CM | POA: Diagnosis not present

## 2014-10-01 DIAGNOSIS — N528 Other male erectile dysfunction: Secondary | ICD-10-CM | POA: Diagnosis not present

## 2014-10-01 DIAGNOSIS — N3941 Urge incontinence: Secondary | ICD-10-CM | POA: Diagnosis not present

## 2014-10-19 ENCOUNTER — Emergency Department (HOSPITAL_BASED_OUTPATIENT_CLINIC_OR_DEPARTMENT_OTHER): Payer: Medicare Other

## 2014-10-19 ENCOUNTER — Emergency Department (HOSPITAL_BASED_OUTPATIENT_CLINIC_OR_DEPARTMENT_OTHER)
Admission: EM | Admit: 2014-10-19 | Discharge: 2014-10-19 | Disposition: A | Discharge status: Home / Self Care | Payer: Medicare Other | Attending: Emergency Medicine | Admitting: Emergency Medicine

## 2014-10-19 ENCOUNTER — Encounter (HOSPITAL_BASED_OUTPATIENT_CLINIC_OR_DEPARTMENT_OTHER): Payer: Self-pay | Admitting: *Deleted

## 2014-10-19 DIAGNOSIS — Z86010 Personal history of colonic polyps: Secondary | ICD-10-CM | POA: Insufficient documentation

## 2014-10-19 DIAGNOSIS — I10 Essential (primary) hypertension: Secondary | ICD-10-CM | POA: Insufficient documentation

## 2014-10-19 DIAGNOSIS — Y9389 Activity, other specified: Secondary | ICD-10-CM | POA: Diagnosis not present

## 2014-10-19 DIAGNOSIS — Z87440 Personal history of urinary (tract) infections: Secondary | ICD-10-CM | POA: Diagnosis not present

## 2014-10-19 DIAGNOSIS — W010XXA Fall on same level from slipping, tripping and stumbling without subsequent striking against object, initial encounter: Secondary | ICD-10-CM | POA: Insufficient documentation

## 2014-10-19 DIAGNOSIS — Z7982 Long term (current) use of aspirin: Secondary | ICD-10-CM | POA: Diagnosis not present

## 2014-10-19 DIAGNOSIS — M479 Spondylosis, unspecified: Secondary | ICD-10-CM | POA: Diagnosis not present

## 2014-10-19 DIAGNOSIS — G629 Polyneuropathy, unspecified: Secondary | ICD-10-CM | POA: Insufficient documentation

## 2014-10-19 DIAGNOSIS — S8992XA Unspecified injury of left lower leg, initial encounter: Secondary | ICD-10-CM | POA: Diagnosis present

## 2014-10-19 DIAGNOSIS — Z85820 Personal history of malignant melanoma of skin: Secondary | ICD-10-CM | POA: Diagnosis not present

## 2014-10-19 DIAGNOSIS — M25462 Effusion, left knee: Secondary | ICD-10-CM | POA: Diagnosis not present

## 2014-10-19 DIAGNOSIS — Z880 Allergy status to penicillin: Secondary | ICD-10-CM | POA: Insufficient documentation

## 2014-10-19 DIAGNOSIS — S8392XA Sprain of unspecified site of left knee, initial encounter: Secondary | ICD-10-CM | POA: Diagnosis not present

## 2014-10-19 DIAGNOSIS — Z791 Long term (current) use of non-steroidal anti-inflammatories (NSAID): Secondary | ICD-10-CM | POA: Diagnosis not present

## 2014-10-19 DIAGNOSIS — Z79899 Other long term (current) drug therapy: Secondary | ICD-10-CM | POA: Insufficient documentation

## 2014-10-19 DIAGNOSIS — K219 Gastro-esophageal reflux disease without esophagitis: Secondary | ICD-10-CM | POA: Insufficient documentation

## 2014-10-19 DIAGNOSIS — Y998 Other external cause status: Secondary | ICD-10-CM | POA: Diagnosis not present

## 2014-10-19 DIAGNOSIS — Y9289 Other specified places as the place of occurrence of the external cause: Secondary | ICD-10-CM | POA: Insufficient documentation

## 2014-10-19 DIAGNOSIS — Z87442 Personal history of urinary calculi: Secondary | ICD-10-CM | POA: Diagnosis not present

## 2014-10-19 DIAGNOSIS — E785 Hyperlipidemia, unspecified: Secondary | ICD-10-CM | POA: Insufficient documentation

## 2014-10-19 DIAGNOSIS — S83402A Sprain of unspecified collateral ligament of left knee, initial encounter: Secondary | ICD-10-CM | POA: Diagnosis not present

## 2014-10-19 DIAGNOSIS — E119 Type 2 diabetes mellitus without complications: Secondary | ICD-10-CM | POA: Insufficient documentation

## 2014-10-19 DIAGNOSIS — W1839XA Other fall on same level, initial encounter: Secondary | ICD-10-CM | POA: Diagnosis not present

## 2014-10-19 DIAGNOSIS — M1990 Unspecified osteoarthritis, unspecified site: Secondary | ICD-10-CM | POA: Diagnosis not present

## 2014-10-19 DIAGNOSIS — M25562 Pain in left knee: Secondary | ICD-10-CM | POA: Diagnosis not present

## 2014-10-19 DIAGNOSIS — W19XXXA Unspecified fall, initial encounter: Secondary | ICD-10-CM

## 2014-10-19 NOTE — ED Notes (Signed)
Pt tripped while outside yesterday, twisting left knee and landing on it pinned beneath his backside.  Pt states he tore his right meniscus about 1-2 years ago and states this feels similar, but on his left knee.  Pt denies head or back injury with fall.  Pt states left knee feels very tight and swollen, but is able to bear weight.

## 2014-10-19 NOTE — ED Notes (Signed)
Pt reports he got off balance and fell while outside fishing- c/o left leg pain

## 2014-10-19 NOTE — Discharge Instructions (Signed)
Ibuprofen 600 mg every 6 hours as needed for pain.  Follow-up with your primary Dr. If not improving in the next week.   Knee Sprain A knee sprain is a tear in one of the strong, fibrous tissues that connect the bones (ligaments) in your knee. The severity of the sprain depends on how much of the ligament is torn. The tear can be either partial or complete. CAUSES  Often, sprains are a result of a fall or injury. The force of the impact causes the fibers of your ligament to stretch too much. This excess tension causes the fibers of your ligament to tear. SIGNS AND SYMPTOMS  You may have some loss of motion in your knee. Other symptoms include:  Bruising.  Pain in the knee area.  Tenderness of the knee to the touch.  Swelling. DIAGNOSIS  To diagnose a knee sprain, your health care provider will physically examine your knee. Your health care provider may also suggest an X-ray exam of your knee to make sure no bones are broken. TREATMENT  If your ligament is only partially torn, treatment usually involves keeping the knee in a fixed position (immobilization) or bracing your knee for activities that require movement for several weeks. To do this, your health care provider will apply a bandage, cast, or splint to keep your knee from moving and to support your knee during movement until it heals. For a partially torn ligament, the healing process usually takes 4-6 weeks. If your ligament is completely torn, depending on which ligament it is, you may need surgery to reconnect the ligament to the bone or reconstruct it. After surgery, a cast or splint may be applied and will need to stay on your knee for 4-6 weeks while your ligament heals. HOME CARE INSTRUCTIONS  Keep your injured knee elevated to decrease swelling.  To ease pain and swelling, apply ice to the injured area:  Put ice in a plastic bag.  Place a towel between your skin and the bag.  Leave the ice on for 20 minutes, 2-3 times  a day.  Only take medicine for pain as directed by your health care provider.  Do not leave your knee unprotected until pain and stiffness go away (usually 4-6 weeks).  If you have a cast or splint, do not allow it to get wet. If you have been instructed not to remove it, cover it with a plastic bag when you shower or bathe. Do not swim.  Your health care provider may suggest exercises for you to do during your recovery to prevent or limit permanent weakness and stiffness. SEEK IMMEDIATE MEDICAL CARE IF:  Your cast or splint becomes damaged.  Your pain becomes worse.  You have significant pain, swelling, or numbness below the cast or splint. MAKE SURE YOU:  Understand these instructions.  Will watch your condition.  Will get help right away if you are not doing well or get worse. Document Released: 11/28/2005 Document Revised: 09/18/2013 Document Reviewed: 07/10/2013 [facility] Patient Information 2015 [place], [place]. This information is not intended to replace advice given to you by your health care provider. Make sure you discuss any questions you have with your health care provider.

## 2014-10-20 NOTE — ED Provider Notes (Signed)
CSN: [id number]     Arrival date & time 10/19/14  1225 History   First MD Initiated Contact with Patient 10/19/14 1407     Chief Complaint  Patient presents with  . Leg Pain     (Consider location/radiation/quality/duration/timing/severity/associated sxs/prior Treatment) HPI Comments: Patient tripped and fell yesterday, injuring his left knee.  It is painful to bear weight.    Patient is a 78 y.o. male presenting with leg pain. The history is provided by the patient.  Leg Pain Location:  Knee Time since incident:  1 day Injury: yes   Knee location:  L knee Pain details:    Quality:  Sharp   Radiates to:  Does not radiate   Severity:  Moderate   Onset quality:  Sudden   Duration:  1 day   Timing:  Constant   Progression:  Unchanged Chronicity:  New   Past Medical History  Diagnosis Date  . BPH (benign prostatic hyperplasia)     hx s/p turp  . Arthritis     left knee and right shoulder  . Hyperlipidemia     takes Simvastatin and Tricor daily  . Hypertension     takes Lisinopril daily  . GERD (gastroesophageal reflux disease)     takes Omeprazole daily  . Diabetes mellitus     takes Metformin and Victoza daily  . Joint pain     both knees  . Joint swelling     rt knee  . Chronic back pain     spondylosis  . Melanoma     right hand;basal cell carcinoma  . H/O hiatal hernia   . History of colon polyps   . Diverticulitis   . History of kidney stones   . Neuropathy   . Hepatitis     with Mono and jaundice 1960  . Prostate infection     currently taking macrobid  . Diarrhea     x 1 today   Past Surgical History  Procedure Laterality Date  . Shoulder arthroscopy w/ rotator cuff repair Left NOV 2010  . Tonsillectomy  AGE 41  . Transurethral resection of prostate  2006  . Cortisone injection    . I&d left knee  1958    states 22 times  . C5 and t1 bone chip removed   1986  . Cholecystectomy  1993  . Right shoulder surgery  2012  . Colonoscopy    . Lumbar  laminectomy/decompression microdiscectomy Right 02/17/2014    Procedure: RIGHT LUMBAR TWO-THREE LAMINECTOMY;  Surgeon: Charlie Pitter, MD;  Location: [facility];  Service: Neurosurgery;  Laterality: Right;  right   . Knee arthroscopy Right 07/16/2014    Procedure: RIGHT ARTHROSCOPY KNEE WITH Medial and Lateral DEBRIDEMENT and chondroplasty;  Surgeon: Gearlean Alf, MD;  Location: [facility];  Service: Orthopedics;  Laterality: Right;   No family history on file. History  Substance Use Topics  . Smoking status: Former Smoker    Quit date: 07/14/1978  . Smokeless tobacco: Never Used     Comment: quit smoking 1979  . Alcohol Use: Yes     Comment: OCCASIONAL    Review of Systems  All other systems reviewed and are negative.     Allergies  Shrimp and Penicillins  Home Medications   Prior to Admission medications   Medication Sig Start Date End Date Taking? Authorizing Provider  aspirin 81 MG tablet Take 81 mg by mouth daily.     Yes Historical Provider, MD  calcium-vitamin  D (OSCAL WITH D) 250-125 MG-UNIT per tablet Take 1 tablet by mouth 2 (two) times daily.     Yes Historical Provider, MD  celecoxib (CELEBREX) 200 MG capsule Take 200 mg by mouth daily.    Yes Historical Provider, MD  fenofibrate (TRICOR) 145 MG tablet Take 145 mg by mouth every evening.    Yes Historical Provider, MD  Liraglutide 18 MG/3ML SOPN Inject 1.8 mg into the skin daily.   Yes Historical Provider, MD  lisinopril (PRINIVIL,ZESTRIL) 10 MG tablet Take 10 mg by mouth every morning.    Yes Historical Provider, MD  metFORMIN (GLUCOPHAGE) 1000 MG tablet Take 1,000 mg by mouth 2 (two) times daily with a meal.   Yes Historical Provider, MD  Multiple Vitamin (MULTIVITAMIN) tablet Take 1 tablet by mouth daily.     Yes Historical Provider, MD  omeprazole (PRILOSEC) 20 MG capsule Take 20 mg by mouth daily.    Yes Historical Provider, MD  polycarbophil (FIBERCON) 625 MG tablet Take 1,875 mg by mouth 2 (two) times daily.   Yes  Historical Provider, MD  Probiotic Product (PROBIOTIC FORMULA PO) Take 300 mg by mouth daily.    Yes Historical Provider, MD  simvastatin (ZOCOR) 10 MG tablet Take 10 mg by mouth at bedtime.     Yes Historical Provider, MD  vitamin C (ASCORBIC ACID) 500 MG tablet Take 500 mg by mouth daily.     Yes Historical Provider, MD  HYDROcodone-acetaminophen (NORCO) 5-325 MG per tablet Take 1-2 tablets by mouth every 6 (six) hours as needed for moderate pain. 07/16/14   Gearlean Alf, MD  methocarbamol (ROBAXIN) 500 MG tablet Take 1 tablet (500 mg total) by mouth 4 (four) times daily. As needed for muscle spasm 07/16/14   Gearlean Alf, MD  nitrofurantoin, macrocrystal-monohydrate, (MACROBID) 100 MG capsule Take 100 mg by mouth 2 (two) times daily.    Historical Provider, MD   BP 133/79 mmHg  Pulse 83  Temp(Src) 97.8 F (36.6 C) (Oral)  Resp 18  Ht 5\' 11"  (1.803 m)  Wt 211 lb (95.709 kg)  BMI 29.44 kg/m2  SpO2 97% Physical Exam  Constitutional: He is oriented to person, place, and time. He appears well-developed and well-nourished. No distress.  HENT:  Head: Normocephalic and atraumatic.  Neck: Normal range of motion. Neck supple.  Musculoskeletal:  The left knee appears grossly normal, with possibly a small effusion.  He has good range of motion without crepitus.  There is no instability with anterior and posterior drawer.  No laxity with varus or valgus stress.  Neurological: He is alert and oriented to person, place, and time.  Skin: Skin is warm and dry. He is not diaphoretic.  Nursing note and vitals reviewed.   ED Course  Procedures (including critical care time) Labs Review Labs Reviewed - No data to display  Imaging Review Dg Knee Complete 4 Views Left  10/19/2014   CLINICAL DATA:  Pt fell backwards yesterday onto bent left knee, pain w/ some swelling entire lt knee  EXAM: LEFT KNEE - COMPLETE 4+ VIEW  COMPARISON:  Right knee dated 01/22/2013 at [facility].   FINDINGS: Tricompartmental degenerative changes. Moderate-sized effusion. No fracture or dislocation seen.  IMPRESSION: 1. Tricompartmental degenerative changes. 2. Moderate-sized effusion. 3. No visible fracture.   Electronically Signed   By: Enrique Sack M.D.   On: 10/19/2014 14:36     EKG Interpretation None      MDM   Final diagnoses:  Fall  Left knee sprain, initial encounter    Knee sprain.  Will treat with rest, ace, prn follow up if not improving in the next 1-2 weeks.    Veryl Speak, MD 10/20/14 [phone]

## 2014-11-10 ENCOUNTER — Telehealth: Payer: Self-pay | Admitting: *Deleted

## 2014-11-10 NOTE — Telephone Encounter (Signed)
Received medical records via mail from [facility]. Forms forwarded to [facility], [place]. JG//CMA

## 2014-11-13 DIAGNOSIS — N3941 Urge incontinence: Secondary | ICD-10-CM | POA: Diagnosis not present

## 2014-11-14 DIAGNOSIS — M25562 Pain in left knee: Secondary | ICD-10-CM | POA: Diagnosis not present

## 2014-11-14 DIAGNOSIS — M170 Bilateral primary osteoarthritis of knee: Secondary | ICD-10-CM | POA: Diagnosis not present

## 2014-11-14 DIAGNOSIS — M25561 Pain in right knee: Secondary | ICD-10-CM | POA: Diagnosis not present

## 2014-11-20 DIAGNOSIS — M170 Bilateral primary osteoarthritis of knee: Secondary | ICD-10-CM | POA: Diagnosis not present

## 2014-11-20 DIAGNOSIS — M25561 Pain in right knee: Secondary | ICD-10-CM | POA: Diagnosis not present

## 2014-11-25 ENCOUNTER — Ambulatory Visit (INDEPENDENT_AMBULATORY_CARE_PROVIDER_SITE_OTHER): Payer: Medicare Other | Admitting: Physician Assistant

## 2014-11-25 ENCOUNTER — Encounter: Payer: Self-pay | Admitting: Physician Assistant

## 2014-11-25 VITALS — BP 110/78 | HR 85 | Temp 98.6°F | Resp 16 | Ht 71.0 in | Wt 213.0 lb

## 2014-11-25 DIAGNOSIS — E119 Type 2 diabetes mellitus without complications: Secondary | ICD-10-CM | POA: Diagnosis not present

## 2014-11-25 DIAGNOSIS — M170 Bilateral primary osteoarthritis of knee: Secondary | ICD-10-CM

## 2014-11-25 DIAGNOSIS — E785 Hyperlipidemia, unspecified: Secondary | ICD-10-CM | POA: Diagnosis not present

## 2014-11-25 DIAGNOSIS — K219 Gastro-esophageal reflux disease without esophagitis: Secondary | ICD-10-CM | POA: Diagnosis not present

## 2014-11-25 DIAGNOSIS — M129 Arthropathy, unspecified: Secondary | ICD-10-CM | POA: Diagnosis not present

## 2014-11-25 DIAGNOSIS — R32 Unspecified urinary incontinence: Secondary | ICD-10-CM | POA: Diagnosis not present

## 2014-11-25 MED ORDER — METFORMIN HCL ER 500 MG PO TB24: 500.0000 mg | ORAL_TABLET | Freq: Two times a day (BID) | ORAL | Status: DC

## 2014-11-25 MED ORDER — FREESTYLE LANCETS MISC: 1.0000 | Freq: Two times a day (BID) | Status: DC

## 2014-11-25 MED ORDER — CELECOXIB 200 MG PO CAPS: 200.0000 mg | ORAL_CAPSULE | Freq: Two times a day (BID) | ORAL | Status: DC

## 2014-11-25 NOTE — Progress Notes (Signed)
Pre visit review using our clinic review tool, if applicable. No additional management support is needed unless otherwise documented below in the visit note/SLS  

## 2014-11-25 NOTE — Patient Instructions (Signed)
Please continue medications as directed.  I have sent refills to your pharmacy (mail order).  Please follow-up with me in April for your wellness.  I will call you once I have your results.  Diabetes and Foot Care Diabetes may cause you to have problems because of poor blood supply (circulation) to your feet and legs. This may cause the skin on your feet to become thinner, break easier, and heal more slowly. Your skin may become dry, and the skin may peel and crack. You may also have nerve damage in your legs and feet causing decreased feeling in them. You may not notice minor injuries to your feet that could lead to infections or more serious problems. Taking care of your feet is one of the most important things you can do for yourself.  HOME CARE INSTRUCTIONS  Wear shoes at all times, even in the house. Do not go barefoot. Bare feet are easily injured.  Check your feet daily for blisters, cuts, and redness. If you cannot see the bottom of your feet, use a mirror or ask someone for help.  Wash your feet with warm water (do not use hot water) and mild soap. Then pat your feet and the areas between your toes until they are completely dry. Do not soak your feet as this can dry your skin.  Apply a moisturizing lotion or petroleum jelly (that does not contain alcohol and is unscented) to the skin on your feet and to dry, brittle toenails. Do not apply lotion between your toes.  Trim your toenails straight across. Do not dig under them or around the cuticle. File the edges of your nails with an emery board or nail file.  Do not cut corns or calluses or try to remove them with medicine.  Wear clean socks or stockings every day. Make sure they are not too tight. Do not wear knee-high stockings since they may decrease blood flow to your legs.  Wear shoes that fit properly and have enough cushioning. To break in new shoes, wear them for just a few hours a day. This prevents you from injuring your feet.  Always look in your shoes before you put them on to be sure there are no objects inside.  Do not cross your legs. This may decrease the blood flow to your feet.  If you find a minor scrape, cut, or break in the skin on your feet, keep it and the skin around it clean and dry. These areas may be cleansed with mild soap and water. Do not cleanse the area with peroxide, alcohol, or iodine.  When you remove an adhesive bandage, be sure not to damage the skin around it.  If you have a wound, look at it several times a day to make sure it is healing.  Do not use heating pads or hot water bottles. They may burn your skin. If you have lost feeling in your feet or legs, you may not know it is happening until it is too late.  Make sure your health care provider performs a complete foot exam at least annually or more often if you have foot problems. Report any cuts, sores, or bruises to your health care provider immediately. SEEK MEDICAL CARE IF:   You have an injury that is not healing.  You have cuts or breaks in the skin.  You have an ingrown nail.  You notice redness on your legs or feet.  You feel burning or tingling in your legs or  feet.  You have pain or cramps in your legs and feet.  Your legs or feet are numb.  Your feet always feel cold. SEEK IMMEDIATE MEDICAL CARE IF:   There is increasing redness, swelling, or pain in or around a wound.  There is a red line that goes up your leg.  Pus is coming from a wound.  You develop a fever or as directed by your health care provider.  You notice a bad smell coming from an ulcer or wound. Document Released: 11/25/2000 Document Revised: 07/31/2013 Document Reviewed: 05/07/2013 [facility] Patient Information 2015 [place], [place]. This information is not intended to replace advice given to you by your health care provider. Make sure you discuss any questions you have with your health care provider.

## 2014-11-25 NOTE — Progress Notes (Signed)
Patient presents to clinic today to establish care.   Acute Concerns: Patient requesting medication refills.  Chronic Issues: Chronic Osteoarthritis of Knees -- long-standing history. Patient followed by orthopedic surgery. Currently on Celebrex 200 mg twice a day as needed.  Hyperlipidemia -- also with elevated triglycerides. Patient endorses currently being on TriCor 145 mg and simvastatin 10 mg daily. Denies myalgias. Denies history of hepatic impairment.  Diabetes mellitus type 2, controlled -- patient endorses blood sugars are well controlled. Endorses fasting glucose averaging around 90. Is currently on metformin 500 mg twice daily and Victoza daily. Gives a good check on his feet. Denies history of neuropathy, nephropathy or retinopathy. Is currently on lisinopril 10 mg daily.  Urinary incontinence -- followed by urology. Has previously been on Mirapex check 50 mg, but will be switching to Detrol LA 4 mg daily soon. Has scheduled follow-up.  GERD -- well controlled with Prilosec 20 mg daily. Denies breakthrough symptoms.  Past Medical History  Diagnosis Date  . BPH (benign prostatic hyperplasia)     hx s/p turp  . Arthritis     left knee and right shoulder  . Hyperlipidemia     takes Simvastatin and Tricor daily  . Hypertension     takes Lisinopril daily  . GERD (gastroesophageal reflux disease)     takes Omeprazole daily  . Diabetes mellitus     takes Metformin and Victoza daily  . Joint pain     both knees  . Joint swelling     rt knee  . Chronic back pain     spondylosis  . Melanoma     right hand;basal cell carcinoma  . H/O hiatal hernia   . History of colon polyps   . Diverticulitis   . History of kidney stones   . Neuropathy   . Hepatitis     with Mono and jaundice 1960  . Prostate infection     currently taking macrobid  . Diarrhea     x 1 today  . History of chicken pox   . Urinary incontinence   . UTI (lower urinary tract infection)   . Back  pain at L4-L5 level     Past Surgical History  Procedure Laterality Date  . Shoulder arthroscopy w/ rotator cuff repair Left NOV 2010  . Tonsillectomy  AGE 16  . Transurethral resection of prostate  2006  . Cortisone injection    . I&d left knee  1958    states 22 times  . C5 and t1 bone chip removed   1986  . Cholecystectomy  1993  . Right shoulder surgery  2012  . Colonoscopy  2011  . Lumbar laminectomy/decompression microdiscectomy Right 02/17/2014    Procedure: RIGHT LUMBAR TWO-THREE LAMINECTOMY;  Surgeon: Charlie Pitter, MD;  Location: [facility];  Service: Neurosurgery;  Laterality: Right;  right   . Knee arthroscopy Right 07/16/2014    Procedure: RIGHT ARTHROSCOPY KNEE WITH Medial and Lateral DEBRIDEMENT and chondroplasty;  Surgeon: Gearlean Alf, MD;  Location: [facility];  Service: Orthopedics;  Laterality: Right;  . Wisdom tooth extraction    . Knee arthroscopy w/ meniscal repair  07/2014    Right    Current Outpatient Prescriptions on File Prior to Visit  Medication Sig Dispense Refill  . aspirin 81 MG tablet Take 81 mg by mouth daily.      . calcium-vitamin D (OSCAL WITH D) 250-125 MG-UNIT per tablet Take 1 tablet by mouth 2 (two) times daily.      [name] Kitchen  fenofibrate (TRICOR) 145 MG tablet Take 145 mg by mouth every evening.     . Liraglutide 18 MG/3ML SOPN Inject 1.8 mg into the skin daily.    [name] Kitchen lisinopril (PRINIVIL,ZESTRIL) 10 MG tablet Take 10 mg by mouth every morning.     . Multiple Vitamin (MULTIVITAMIN) tablet Take 1 tablet by mouth daily.      [name] Kitchen omeprazole (PRILOSEC) 20 MG capsule Take 20 mg by mouth daily.     . polycarbophil (FIBERCON) 625 MG tablet Take 1,875 mg by mouth 2 (two) times daily.    . Probiotic Product (PROBIOTIC FORMULA PO) Take 300 mg by mouth daily.     . simvastatin (ZOCOR) 10 MG tablet Take 10 mg by mouth at bedtime.      . vitamin C (ASCORBIC ACID) 500 MG tablet Take 500 mg by mouth daily.       No current facility-administered medications on file  prior to visit.    Allergies  Allergen Reactions  . Shrimp [Shellfish Allergy] Itching    Unsure if allergic - has been eating fried fish  And shell fish since with no reactions  . Penicillins Rash    Family History  Problem Relation Age of Onset  . Heart disease Mother 45    Deceased  . Bladder Cancer Mother   . Prostate cancer Father     Deceased-in 62s  . Healthy Brother   . Skin cancer Brother     #2  . Asthma Sister     Deceased  . Healthy Son     #1  . Healthy Daughter     #2  . Obesity Son     #2    History   Social History  . Marital Status: Married    Spouse Name: N/A    Number of Children: N/A  . Years of Education: N/A   Occupational History  . Not on file.   Social History Main Topics  . Smoking status: Former Smoker    Quit date: 07/14/1978  . Smokeless tobacco: Never Used     Comment: quit smoking 1979  . Alcohol Use: Yes     Comment: OCCASIONAL  . Drug Use: No  . Sexual Activity: Yes   Other Topics Concern  . Not on file   Social History Narrative   ROS Pertinent ROS are listed in HPI.  BP 110/78 mmHg  Pulse 85  Temp(Src) 98.6 F (37 C) (Oral)  Resp 16  Ht 5\' 11"  (1.803 m)  Wt 213 lb (96.616 kg)  BMI 29.72 kg/m2  SpO2 96%  Physical Exam  Constitutional: He is oriented to person, place, and time and well-developed, well-nourished, and in no distress.  HENT:  Head: Normocephalic and atraumatic.  Right Ear: External ear normal.  Left Ear: External ear normal.  Nose: Nose normal.  Mouth/Throat: Oropharynx is clear and moist. No oropharyngeal exudate.  Tympanic membranes within normal limits bilaterally.  Eyes: Conjunctivae and EOM are normal. Pupils are equal, round, and reactive to light.  Neck: Neck supple.  Cardiovascular: Normal rate, regular rhythm, normal heart sounds and intact distal pulses.   Pulmonary/Chest: Effort normal and breath sounds normal. No respiratory distress. He has no wheezes. He has no rales. He  exhibits no tenderness.  Neurological: He is alert and oriented to person, place, and time.  Skin: Skin is warm and dry. No rash noted.  Psychiatric: Affect normal.  Vitals reviewed.    Assessment/Plan: Gastroesophageal reflux disease without esophagitis Well-controlled at present.  Continue current regimen. Medications refilled. Diet for heartburn discussed with patient.  Diabetes mellitus type II, controlled Patient endorses last A1c at 5.7. Fasting glucose measurements are in good range. Continue current regimen. Medications refilled. Will complete diabetic foot exam and repeat lab work at his wellness visit. Patient is to call and schedule that visit.  Arthritis of both knees Celebrex refill. Continue follow-up with orthopedic surgery. Avoid heavy lifting or over exertion. Topical Aspercreme to apply the knees.   Absence of bladder continence Follow-up with urology as directed.  Hyperlipidemia LDL goal <100 Continue current medication regimen. We'll check LFTs and lipid panel at wellness visit.

## 2014-11-26 DIAGNOSIS — M170 Bilateral primary osteoarthritis of knee: Secondary | ICD-10-CM | POA: Insufficient documentation

## 2014-11-26 DIAGNOSIS — K219 Gastro-esophageal reflux disease without esophagitis: Secondary | ICD-10-CM | POA: Insufficient documentation

## 2014-11-26 DIAGNOSIS — E119 Type 2 diabetes mellitus without complications: Secondary | ICD-10-CM | POA: Insufficient documentation

## 2014-11-26 DIAGNOSIS — R32 Unspecified urinary incontinence: Secondary | ICD-10-CM | POA: Insufficient documentation

## 2014-11-26 DIAGNOSIS — E785 Hyperlipidemia, unspecified: Secondary | ICD-10-CM | POA: Insufficient documentation

## 2014-11-26 NOTE — Assessment & Plan Note (Signed)
Patient endorses last A1c at 5.7. Fasting glucose measurements are in good range. Continue current regimen. Medications refilled. Will complete diabetic foot exam and repeat lab work at his wellness visit. Patient is to call and schedule that visit.

## 2014-11-26 NOTE — Assessment & Plan Note (Signed)
Celebrex refill. Continue follow-up with orthopedic surgery. Avoid heavy lifting or over exertion. Topical Aspercreme to apply the knees.

## 2014-11-26 NOTE — Assessment & Plan Note (Signed)
Follow up with urology as directed

## 2014-11-26 NOTE — Assessment & Plan Note (Signed)
Well-controlled at present. Continue current regimen. Medications refilled. Diet for heartburn discussed with patient.

## 2014-11-26 NOTE — Assessment & Plan Note (Addendum)
Continue current medication regimen. We'll check LFTs and lipid panel at wellness visit.

## 2014-12-19 DIAGNOSIS — M170 Bilateral primary osteoarthritis of knee: Secondary | ICD-10-CM | POA: Diagnosis not present

## 2015-01-23 ENCOUNTER — Encounter: Payer: Self-pay | Admitting: Physician Assistant

## 2015-01-23 ENCOUNTER — Ambulatory Visit (INDEPENDENT_AMBULATORY_CARE_PROVIDER_SITE_OTHER): Payer: Medicare Other | Admitting: Physician Assistant

## 2015-01-23 VITALS — BP 135/70 | HR 73 | Temp 98.0°F | Resp 18 | Ht 71.0 in | Wt 219.2 lb

## 2015-01-23 DIAGNOSIS — E119 Type 2 diabetes mellitus without complications: Secondary | ICD-10-CM | POA: Diagnosis not present

## 2015-01-23 MED ORDER — EXENATIDE ER 2 MG ~~LOC~~ PEN: 2.0000 mg | PEN_INJECTOR | SUBCUTANEOUS | Status: DC

## 2015-01-23 NOTE — Progress Notes (Signed)
Pre visit review using our clinic review tool, if applicable. No additional management support is needed unless otherwise documented below in the visit note/SLS  

## 2015-01-23 NOTE — Assessment & Plan Note (Signed)
Bydureon covered under new formulary.  Will discontinue Victoza and begin 2mg  Bydureon weekly.  Continue metformin.  Fasting glucose goals discussed with patient.  Follow-up in 1 month.

## 2015-01-23 NOTE — Patient Instructions (Signed)
Please let me know if you run into any issue with the new Bydureon prescription.  I will see you at your Wellness visit.  Remember our blood sugar goal is 80-120 fasting.  So far you are doing great!!

## 2015-01-23 NOTE — Progress Notes (Signed)
Patient presents to clinic today to discuss formulary changes regarding his Victoza.  Patient has paperwork showing where it will no longer be a preferred medication.  Is needing a suitable alternative.   Past Medical History  Diagnosis Date  . BPH (benign prostatic hyperplasia)     hx s/p turp  . Arthritis     left knee and right shoulder  . Hyperlipidemia     takes Simvastatin and Tricor daily  . Hypertension     takes Lisinopril daily  . GERD (gastroesophageal reflux disease)     takes Omeprazole daily  . Diabetes mellitus     takes Metformin and Victoza daily  . Joint pain     both knees  . Joint swelling     rt knee  . Chronic back pain     spondylosis  . Melanoma     right hand;basal cell carcinoma  . H/O hiatal hernia   . History of colon polyps   . Diverticulitis   . History of kidney stones   . Neuropathy   . Hepatitis     with Mono and jaundice 1960  . Prostate infection     currently taking macrobid  . Diarrhea     x 1 today  . History of chicken pox   . Urinary incontinence   . UTI (lower urinary tract infection)   . Back pain at L4-L5 level     Current Outpatient Prescriptions on File Prior to Visit  Medication Sig Dispense Refill  . aspirin 81 MG tablet Take 81 mg by mouth daily.      . calcium-vitamin D (OSCAL WITH D) 250-125 MG-UNIT per tablet Take 2 tablets by mouth 2 (two) times daily. Calcium 333 mg    . celecoxib (CELEBREX) 200 MG capsule Take 1 capsule (200 mg total) by mouth 2 (two) times daily. 180 capsule 1  . fenofibrate (TRICOR) 145 MG tablet Take 145 mg by mouth every evening.     . Lancets (FREESTYLE) lancets 1 each by Other route 2 (two) times daily. Use as instructed 100 each 12  . Liraglutide 18 MG/3ML SOPN Inject 1.8 mg into the skin daily.    [name] Kitchen lisinopril (PRINIVIL,ZESTRIL) 10 MG tablet Take 10 mg by mouth every morning.     . metFORMIN (GLUCOPHAGE-XR) 500 MG 24 hr tablet Take 1 tablet (500 mg total) by mouth 2 (two) times daily  with a meal. 180 tablet 1  . Multiple Vitamin (MULTIVITAMIN) tablet Take 1 tablet by mouth daily.      [name] Kitchen omeprazole (PRILOSEC) 20 MG capsule Take 20 mg by mouth daily.     . polycarbophil (FIBERCON) 625 MG tablet Take 1,875 mg by mouth 2 (two) times daily.    . Probiotic Product (PROBIOTIC FORMULA PO) Take 300 mg by mouth daily.     . simvastatin (ZOCOR) 10 MG tablet Take 10 mg by mouth at bedtime.      . vitamin C (ASCORBIC ACID) 500 MG tablet Take 500 mg by mouth daily.       No current facility-administered medications on file prior to visit.    Allergies  Allergen Reactions  . Shrimp [Shellfish Allergy] Itching    Unsure if allergic - has been eating fried fish  And shell fish since with no reactions  . Penicillins Rash    Family History  Problem Relation Age of Onset  . Heart disease Mother 54    Deceased  . Bladder Cancer Mother   .  Prostate cancer Father     Deceased-in 70s  . Healthy Brother   . Skin cancer Brother     #2  . Asthma Sister     Deceased  . Healthy Son     #1  . Healthy Daughter     #2  . Obesity Son     #2    History   Social History  . Marital Status: Married    Spouse Name: N/A  . Number of Children: N/A  . Years of Education: N/A   Social History Main Topics  . Smoking status: Former Smoker    Quit date: 07/14/1978  . Smokeless tobacco: Never Used     Comment: quit smoking 1979  . Alcohol Use: Yes     Comment: OCCASIONAL  . Drug Use: No  . Sexual Activity: Yes   Other Topics Concern  . None   Social History Narrative   Review of Systems - See HPI.  All other ROS are negative.  BP 135/70 mmHg  Pulse 73  Temp(Src) 98 F (36.7 C) (Oral)  Resp 18  Ht 5\' 11"  (1.803 m)  Wt 219 lb 4 oz (99.451 kg)  BMI 30.59 kg/m2  SpO2 99%  Physical Exam  Constitutional: He is oriented to person, place, and time and well-developed, well-nourished, and in no distress.  HENT:  Head: Normocephalic and atraumatic.  Cardiovascular: Normal  rate, regular rhythm, normal heart sounds and intact distal pulses.   Pulmonary/Chest: Effort normal and breath sounds normal. No respiratory distress. He has no wheezes. He has no rales. He exhibits no tenderness.  Neurological: He is alert and oriented to person, place, and time.  Skin: Skin is warm and dry. No rash noted.  Psychiatric: Affect normal.  Vitals reviewed.  Assessment/Plan: Diabetes mellitus type II, controlled Bydureon covered under new formulary.  Will discontinue Victoza and begin 2mg  Bydureon weekly.  Continue metformin.  Fasting glucose goals discussed with patient.  Follow-up in 1 month.

## 2015-02-04 ENCOUNTER — Encounter: Payer: Self-pay | Admitting: Physician Assistant

## 2015-02-04 DIAGNOSIS — R32 Unspecified urinary incontinence: Secondary | ICD-10-CM

## 2015-02-09 ENCOUNTER — Encounter: Payer: Self-pay | Admitting: Physician Assistant

## 2015-02-10 MED ORDER — SIMVASTATIN 10 MG PO TABS: 10.0000 mg | ORAL_TABLET | Freq: Every day | ORAL | Status: DC

## 2015-02-19 DIAGNOSIS — Z08 Encounter for follow-up examination after completed treatment for malignant neoplasm: Secondary | ICD-10-CM | POA: Diagnosis not present

## 2015-02-19 DIAGNOSIS — L570 Actinic keratosis: Secondary | ICD-10-CM | POA: Diagnosis not present

## 2015-02-19 DIAGNOSIS — Z85828 Personal history of other malignant neoplasm of skin: Secondary | ICD-10-CM | POA: Diagnosis not present

## 2015-02-19 DIAGNOSIS — D485 Neoplasm of uncertain behavior of skin: Secondary | ICD-10-CM | POA: Diagnosis not present

## 2015-02-19 DIAGNOSIS — D0439 Carcinoma in situ of skin of other parts of face: Secondary | ICD-10-CM | POA: Diagnosis not present

## 2015-02-19 DIAGNOSIS — D2239 Melanocytic nevi of other parts of face: Secondary | ICD-10-CM | POA: Diagnosis not present

## 2015-02-26 DIAGNOSIS — M1711 Unilateral primary osteoarthritis, right knee: Secondary | ICD-10-CM | POA: Diagnosis not present

## 2015-02-26 LAB — HM DIABETES EYE EXAM

## 2015-03-02 ENCOUNTER — Encounter: Payer: Self-pay | Admitting: Physician Assistant

## 2015-03-05 ENCOUNTER — Encounter: Payer: Self-pay | Admitting: Physician Assistant

## 2015-03-15 ENCOUNTER — Encounter: Payer: Self-pay | Admitting: Physician Assistant

## 2015-03-16 ENCOUNTER — Encounter: Payer: Self-pay | Admitting: Physician Assistant

## 2015-03-20 ENCOUNTER — Encounter: Payer: Self-pay | Admitting: Physician Assistant

## 2015-03-20 ENCOUNTER — Ambulatory Visit (INDEPENDENT_AMBULATORY_CARE_PROVIDER_SITE_OTHER): Payer: Medicare Other | Admitting: Physician Assistant

## 2015-03-20 VITALS — BP 120/74 | HR 70 | Temp 98.1°F | Resp 16 | Ht 70.0 in | Wt 226.2 lb

## 2015-03-20 DIAGNOSIS — Z136 Encounter for screening for cardiovascular disorders: Secondary | ICD-10-CM

## 2015-03-20 DIAGNOSIS — E119 Type 2 diabetes mellitus without complications: Secondary | ICD-10-CM | POA: Diagnosis not present

## 2015-03-20 DIAGNOSIS — Z01818 Encounter for other preprocedural examination: Secondary | ICD-10-CM

## 2015-03-20 DIAGNOSIS — Z0000 Encounter for general adult medical examination without abnormal findings: Secondary | ICD-10-CM | POA: Diagnosis not present

## 2015-03-20 LAB — COMPREHENSIVE METABOLIC PANEL
ALT: 22 U/L (ref 0–53)
AST: 14 U/L (ref 0–37)
Albumin: 4 g/dL (ref 3.5–5.2)
Alkaline Phosphatase: 39 U/L (ref 39–117)
BUN: 18 mg/dL (ref 6–23)
CHLORIDE: 109 meq/L (ref 96–112)
CO2: 26 meq/L (ref 19–32)
CREATININE: 1.01 mg/dL (ref 0.40–1.50)
Calcium: 9.6 mg/dL (ref 8.4–10.5)
GFR: 76.14 mL/min (ref >60.00)
Glucose, Bld: 125 mg/dL — ABNORMAL HIGH (ref 70–99)
Potassium: 4.2 mEq/L (ref 3.5–5.1)
Sodium: 141 mEq/L (ref 135–145)
TOTAL PROTEIN: 6.7 g/dL (ref 6.0–8.3)
Total Bilirubin: 0.7 mg/dL (ref 0.2–1.2)

## 2015-03-20 LAB — LIPID PANEL
Cholesterol: 123 mg/dL (ref 0–200)
HDL: 43.2 mg/dL (ref >39.00)
LDL Cholesterol: 57 mg/dL (ref 0–99)
NONHDL: 79.8
Total CHOL/HDL Ratio: 3
Triglycerides: 116 mg/dL (ref 0.0–149.0)
VLDL: 23.2 mg/dL (ref 0.0–40.0)

## 2015-03-20 LAB — CBC
HEMATOCRIT: 41.8 % (ref 39.0–52.0)
Hemoglobin: 14.1 g/dL (ref 13.0–17.0)
MCHC: 33.8 g/dL (ref 30.0–36.0)
MCV: 87.1 fl (ref 78.0–100.0)
PLATELETS: 254 10*3/uL (ref 150.0–400.0)
RBC: 4.8 Mil/uL (ref 4.22–5.81)
RDW: 13.6 % (ref 11.5–15.5)
WBC: 6.3 10*3/uL (ref 4.0–10.5)

## 2015-03-20 LAB — URINALYSIS, ROUTINE W REFLEX MICROSCOPIC
Bilirubin Urine: NEGATIVE
HGB URINE DIPSTICK: NEGATIVE
Ketones, ur: NEGATIVE
NITRITE: NEGATIVE
Specific Gravity, Urine: 1.025 (ref 1.000–1.030)
TOTAL PROTEIN, URINE-UPE24: NEGATIVE
Urine Glucose: NEGATIVE
Urobilinogen, UA: 0.2 (ref 0.0–1.0)
pH: 7 (ref 5.0–8.0)

## 2015-03-20 LAB — HEMOGLOBIN A1C: Hgb A1c MFr Bld: 6.3 % (ref 4.6–6.5)

## 2015-03-20 NOTE — Patient Instructions (Signed)
Please stop the Exenatide and resume the Victoza once received. Continue other medications as directed. Stop by the lab for blood work. I will call you once results are in and surgical clearance is granted.  Follow-up in 6 months.

## 2015-03-20 NOTE — Progress Notes (Signed)
Pre visit review using our clinic review tool, if applicable. No additional management support is needed unless otherwise documented below in the visit note/SLS  

## 2015-03-22 LAB — CULTURE, URINE COMPREHENSIVE
COLONY COUNT: NO GROWTH
Organism ID, Bacteria: NO GROWTH

## 2015-03-22 NOTE — Progress Notes (Signed)
Subjective:    Matthew Nash is a 79 y.o. male who presents for Medicare Annual/Subsequent preventive examination.   Preventive Screening-Counseling & Management  Tobacco History  Smoking status  . Former Smoker  . Quit date: 07/14/1978  Smokeless tobacco  . Never Used    Comment: quit smoking 1979    Problems Prior to Visit 1. Patient with upcoming TKR of right knee.  Is in need of surgical clearance.  Current Problems (verified) Patient Active Problem List   Diagnosis Date Noted  . Diabetes mellitus type II, controlled 11/26/2014  . Gastroesophageal reflux disease without esophagitis 11/26/2014  . Arthritis of both knees 11/26/2014  . Absence of bladder continence 11/26/2014  . Hyperlipidemia LDL goal <100 11/26/2014  . Acute medial meniscal tear 07/16/2014  . Lumbosacral spondylosis without myelopathy 02/17/2014  . Spondylosis, lumbosacral 02/17/2014    Medications Prior to Visit Current Outpatient Prescriptions on File Prior to Visit  Medication Sig Dispense Refill  . aspirin 81 MG tablet Take 81 mg by mouth daily.      . calcium-vitamin D (OSCAL WITH D) 250-125 MG-UNIT per tablet Take 2 tablets by mouth 2 (two) times daily. Calcium 333 mg    . celecoxib (CELEBREX) 200 MG capsule Take 1 capsule (200 mg total) by mouth 2 (two) times daily. 180 capsule 1  . Exenatide ER 2 MG PEN Inject 2 mg into the skin once a week. 12 each 0  . fenofibrate (TRICOR) 145 MG tablet Take 145 mg by mouth every evening.     . Lancets (FREESTYLE) lancets 1 each by Other route 2 (two) times daily. Use as instructed 100 each 12  . lisinopril (PRINIVIL,ZESTRIL) 10 MG tablet Take 10 mg by mouth every morning.     . metFORMIN (GLUCOPHAGE-XR) 500 MG 24 hr tablet Take 1 tablet (500 mg total) by mouth 2 (two) times daily with a meal. 180 tablet 1  . Multiple Vitamin (MULTIVITAMIN) tablet Take 1 tablet by mouth daily.      [name] Kitchen omeprazole (PRILOSEC) 20 MG capsule Take 20 mg by mouth daily.     .  polycarbophil (FIBERCON) 625 MG tablet Take 1,875 mg by mouth 2 (two) times daily.    . Probiotic Product (PROBIOTIC FORMULA PO) Take 300 mg by mouth daily.     . simvastatin (ZOCOR) 10 MG tablet Take 1 tablet (10 mg total) by mouth at bedtime. 90 tablet 1  . trospium (SANCTURA) 20 MG tablet Take 40 mg by mouth daily.    . vitamin C (ASCORBIC ACID) 500 MG tablet Take 500 mg by mouth daily.       No current facility-administered medications on file prior to visit.    Current Medications (verified) Current Outpatient Prescriptions  Medication Sig Dispense Refill  . aspirin 81 MG tablet Take 81 mg by mouth daily.      . calcium-vitamin D (OSCAL WITH D) 250-125 MG-UNIT per tablet Take 2 tablets by mouth 2 (two) times daily. Calcium 333 mg    . celecoxib (CELEBREX) 200 MG capsule Take 1 capsule (200 mg total) by mouth 2 (two) times daily. 180 capsule 1  . Exenatide ER 2 MG PEN Inject 2 mg into the skin once a week. 12 each 0  . fenofibrate (TRICOR) 145 MG tablet Take 145 mg by mouth every evening.     . Lancets (FREESTYLE) lancets 1 each by Other route 2 (two) times daily. Use as instructed 100 each 12  . lisinopril (PRINIVIL,ZESTRIL) 10 MG tablet  Take 10 mg by mouth every morning.     . metFORMIN (GLUCOPHAGE-XR) 500 MG 24 hr tablet Take 1 tablet (500 mg total) by mouth 2 (two) times daily with a meal. 180 tablet 1  . Multiple Vitamin (MULTIVITAMIN) tablet Take 1 tablet by mouth daily.      [name] Kitchen omeprazole (PRILOSEC) 20 MG capsule Take 20 mg by mouth daily.     . polycarbophil (FIBERCON) 625 MG tablet Take 1,875 mg by mouth 2 (two) times daily.    . Probiotic Product (PROBIOTIC FORMULA PO) Take 300 mg by mouth daily.     . simvastatin (ZOCOR) 10 MG tablet Take 1 tablet (10 mg total) by mouth at bedtime. 90 tablet 1  . trospium (SANCTURA) 20 MG tablet Take 40 mg by mouth daily.    . vitamin C (ASCORBIC ACID) 500 MG tablet Take 500 mg by mouth daily.       No current facility-administered  medications for this visit.     Allergies (verified) Shrimp and Penicillins   PAST HISTORY  Family History Family History  Problem Relation Age of Onset  . Heart disease Mother 53    Deceased  . Bladder Cancer Mother   . Prostate cancer Father     Deceased-in 39s  . Healthy Brother   . Skin cancer Brother     #2  . Asthma Sister     Deceased  . Healthy Son     #1  . Healthy Daughter     #2  . Obesity Son     #2    Social History History  Substance Use Topics  . Smoking status: Former Smoker    Quit date: 07/14/1978  . Smokeless tobacco: Never Used     Comment: quit smoking 1979  . Alcohol Use: Yes     Comment: OCCASIONAL    Are there smokers in your home (other than you)?  No  Risk Factors Current exercise habits: Home exercise routine includes walking 1 hrs per day.  Dietary issues discussed: Well-balanced diet. Good hydration.   Cardiac risk factors: advanced age (older than 29 for men, 48 for women), dyslipidemia, hypertension and male gender.  Depression Screen (Note: if answer to either of the following is "Yes", a more complete depression screening is indicated)   Q1: Over the past two weeks, have you felt down, depressed or hopeless? No  Q2: Over the past two weeks, have you felt little interest or pleasure in doing things? No  Have you lost interest or pleasure in daily life? No  Do you often feel hopeless? No  Do you cry easily over simple problems? No  Activities of Daily Living In your present state of health, do you have any difficulty performing the following activities?:  Driving? No Managing money?  No Feeding yourself? No Getting from bed to chair? No Climbing a flight of stairs? No Preparing food and eating?: No Bathing or showering? No Getting dressed: No Getting to the toilet? No Using the toilet:No Moving around from place to place: No In the past year have you fallen or had a near fall?:No   Are you sexually active?  No  Do  you have more than one partner?  N/A  Hearing Difficulties: Yes Do you often ask people to speak up or repeat themselves? Yes Do you experience ringing or noises in your ears? Yes Do you have difficulty understanding soft or whispered voices? No   Do you feel that you have a problem  with memory? No  Do you often misplace items? No  Do you feel safe at home?  Yes  Cognitive Testing  Alert? Yes  Normal Appearance?Yes  Oriented to person? Yes  Place? Yes   Time? Yes  Recall of three objects?  Yes  Can perform simple calculations? Yes  Displays appropriate judgment?Yes  Can read the correct time from a watch face?Yes   Advanced Directives have been discussed with the patient? Yes   List the Names of Other Physician/Practitioners you currently use: 1 Dr. Maureen Ralphs -- Orthopedic Surgery 2 Dr. Ronnald Ramp -- Optometry/Ophthalmology 3 Deirdre Pippins, PA-C -- Dermatology 4 Dr. Annette Stable -- Neurosurgery  Indicate any recent Medical Services you may have received from other than [facility] providers in the past year (date may be approximate).  Immunization History  Administered Date(s) Administered  . Influenza-Unspecified 09/11/2014    Screening Tests Health Maintenance  Topic Date Due  . FOOT EXAM  04/07/1948  . URINE MICROALBUMIN  04/07/1948  . TETANUS/TDAP  04/07/1957  . ZOSTAVAX  04/07/1998  . PNA vac Low Risk Adult (1 of 2 - PCV13) 04/08/2003  . INFLUENZA VACCINE  07/13/2015  . HEMOGLOBIN A1C  09/19/2015  . OPHTHALMOLOGY EXAM  02/26/2016  . COLONOSCOPY  10/12/2020    All answers were reviewed with the patient and necessary referrals were made:  Leeanne Rio, PA-C   03/22/2015   History reviewed: allergies, current medications, past family history, past medical history, past social history, past surgical history and problem list  Review of Systems A comprehensive review of systems was negative.    Objective:     Vision by Snellen chart: right eye:20/30, left  eye:20/30 Blood pressure 120/74, pulse 70, temperature 98.1 F (36.7 C), temperature source Oral, resp. rate 16, height 5\' 10"  (1.778 m), weight 226 lb 4 oz (102.626 kg), SpO2 97 %. Body mass index is 32.46 kg/(m^2).  BP 120/74 mmHg  Pulse 70  Temp(Src) 98.1 F (36.7 C) (Oral)  Resp 16  Ht 5\' 10"  (1.778 m)  Wt 226 lb 4 oz (102.626 kg)  BMI 32.46 kg/m2  SpO2 97% General appearance: alert, cooperative, appears stated age and no distress Head: Normocephalic, without obvious abnormality, atraumatic Eyes: conjunctivae/corneas clear. PERRL, EOM's intact. Fundi benign. Ears: normal TM's and external ear canals both ears Nose: Nares normal. Septum midline. Mucosa normal. No drainage or sinus tenderness. Throat: lips, mucosa, and tongue normal; teeth and gums normal Lungs: clear to auscultation bilaterally Chest wall: no tenderness Heart: regular rate and rhythm, S1, S2 normal, no murmur, click, rub or gallop Abdomen: soft, non-tender; bowel sounds normal; no masses,  no organomegaly Extremities: extremities normal, atraumatic, no cyanosis or edema Skin: Skin color, texture, turgor normal. No rashes or lesions     Assessment:     Medicare Wellness, Subsequent Screening for Ischemic Heart Disease Preoperative Clearance      Plan:     During the course of the visit the patient was educated and counseled about appropriate screening and preventive services including:    Pneumococcal vaccine   Td vaccine  Screening electrocardiogram  Prostate cancer screening  Diabetes screening  Diet review for nutrition referral? Yes ____  Not Indicated _x___  EKG reveals NSR. Supportive measures discussed. Will obtain fasting labs today to include urine culture for Preoperative Clearance  Patient Instructions (the written plan) was given to the patient.  Medicare Attestation I have personally reviewed: The patient's medical and social history Their use of alcohol, tobacco or illicit  drugs  Their current medications and supplements The patient's functional ability including ADLs,fall risks, home safety risks, cognitive, and hearing and visual impairment Diet and physical activities Evidence for depression or mood disorders  The patient's weight, height, BMI, and visual acuity have been recorded in the chart.  I have made referrals, counseling, and provided education to the patient based on review of the above and I have provided the patient with a written personalized care plan for preventive services.     Raiford Noble [place], [place]   03/22/2015

## 2015-03-23 ENCOUNTER — Encounter: Payer: Self-pay | Admitting: Physician Assistant

## 2015-03-23 MED ORDER — LIRAGLUTIDE 18 MG/3ML ~~LOC~~ SOPN: 1.8000 mg | PEN_INJECTOR | Freq: Every day | SUBCUTANEOUS | Status: DC

## 2015-03-24 ENCOUNTER — Encounter: Payer: Self-pay | Admitting: Physician Assistant

## 2015-03-24 DIAGNOSIS — N320 Bladder-neck obstruction: Secondary | ICD-10-CM | POA: Diagnosis not present

## 2015-03-24 DIAGNOSIS — R351 Nocturia: Secondary | ICD-10-CM | POA: Diagnosis not present

## 2015-03-24 DIAGNOSIS — R3912 Poor urinary stream: Secondary | ICD-10-CM | POA: Diagnosis not present

## 2015-03-24 DIAGNOSIS — R3916 Straining to void: Secondary | ICD-10-CM | POA: Diagnosis not present

## 2015-04-08 ENCOUNTER — Ambulatory Visit: Payer: Medicare Other | Admitting: Physician Assistant

## 2015-04-14 ENCOUNTER — Encounter: Payer: Self-pay | Admitting: Physician Assistant

## 2015-04-21 DIAGNOSIS — R3916 Straining to void: Secondary | ICD-10-CM | POA: Diagnosis not present

## 2015-04-21 DIAGNOSIS — R351 Nocturia: Secondary | ICD-10-CM | POA: Diagnosis not present

## 2015-04-21 DIAGNOSIS — R3912 Poor urinary stream: Secondary | ICD-10-CM | POA: Diagnosis not present

## 2015-04-22 NOTE — Progress Notes (Signed)
Matthew Nash-  Need PRE OP ORDERS please and thank you

## 2015-04-24 ENCOUNTER — Ambulatory Visit: Payer: Self-pay | Admitting: Orthopedic Surgery

## 2015-04-24 DIAGNOSIS — R3916 Straining to void: Secondary | ICD-10-CM | POA: Diagnosis not present

## 2015-04-24 DIAGNOSIS — R351 Nocturia: Secondary | ICD-10-CM | POA: Diagnosis not present

## 2015-04-24 DIAGNOSIS — R3912 Poor urinary stream: Secondary | ICD-10-CM | POA: Diagnosis not present

## 2015-04-24 DIAGNOSIS — N320 Bladder-neck obstruction: Secondary | ICD-10-CM | POA: Diagnosis not present

## 2015-04-24 NOTE — Progress Notes (Signed)
Surgery on 05/04/15.  Preop on 5/17 at 0900am.  Need orders in EPIC.  Thank You.

## 2015-04-24 NOTE — Progress Notes (Signed)
Preoperative surgical orders have been place into the Epic hospital system for Matthew Nash on 04/24/2015, 12:07 PM  by Mickel Crow for surgery on 05-04-2015.  Preop Total Knee orders including Experal, IV Tylenol, and IV Decadron as long as there are no contraindications to the above medications.  Never received request for orders until today. [name], PA-C

## 2015-04-27 NOTE — Progress Notes (Signed)
Surgery clearance note Matthew Nash, [name] 03/20/15 on chart  CBC, CMET, HGB A1C, UA, lipid profile results 03/20/15 on chart EKG 03/20/15 on chart and on EPIC

## 2015-04-27 NOTE — Patient Instructions (Signed)
Matthew Nash  04/27/2015   Your procedure is scheduled on: Monday 05/04/15  Report to [facility] Main  Entrance and follow signs to               [place] at 09:40 AM.  Call this number if you have problems the morning of surgery [phone]   Remember: ONLY 1 PERSON MAY GO WITH YOU TO SHORT STAY TO GET  READY MORNING OF [place].  Do not eat food or drink liquids :After Midnight.     Take these medicines the morning of surgery with A SIP OF WATER: prilosec                               You may not have any metal on your body including hair pins and              piercings  Do not wear jewelry, make-up, lotions, powders or perfumes, deodorant             Do not wear nail polish.  Do not shave  48 hours prior to surgery.              Men may shave face and neck.  Do not bring valuables to the hospital. [place].  Contacts, dentures or bridgework may not be worn into surgery.  Leave suitcase in the car. After surgery it may be brought to your room.              Please read over the following fact sheets you were given: MRSA information  _____________________________________________________________________  [facility] - Preparing for Surgery Before surgery, you can play an important role.  Because skin is not sterile, your skin needs to be as free of germs as possible.  You can reduce the number of germs on your skin by washing with CHG (chlorahexidine gluconate) soap before surgery.  CHG is an antiseptic cleaner which kills germs and bonds with the skin to continue killing germs even after washing. Please DO NOT use if you have an allergy to CHG or antibacterial soaps.  If your skin becomes reddened/irritated stop using the CHG and inform your nurse when you arrive at Short Stay. Do not shave (including legs and underarms) for at least 48 hours prior to the first CHG shower.  You may shave your  face/neck. Please follow these instructions carefully:  1.  Shower with CHG Soap the night before surgery and the  morning of Surgery.  2.  If you choose to wash your hair, wash your hair first as usual with your  normal  shampoo.  3.  After you shampoo, rinse your hair and body thoroughly to remove the  shampoo.                            4.  Use CHG as you would any other liquid soap.  You can apply chg directly  to the skin and wash                       Gently with a scrungie or clean washcloth.  5.  Apply the CHG Soap to your body ONLY FROM  THE NECK DOWN.   Do not use on face/ open                           Wound or open sores. Avoid contact with eyes, ears mouth and genitals (private parts).                       Wash face,  Genitals (private parts) with your normal soap.             6.  Wash thoroughly, paying special attention to the area where your surgery  will be performed.  7.  Thoroughly rinse your body with warm water from the neck down.  8.  DO NOT shower/wash with your normal soap after using and rinsing off  the CHG Soap.                9.  Pat yourself dry with a clean towel.            10.  Wear clean pajamas.            11.  Place clean sheets on your bed the night of your first shower and do not  sleep with pets. Day of Surgery : Do not apply any lotions/deodorants the morning of surgery.  Please wear clean clothes to the hospital/surgery center.  FAILURE TO FOLLOW THESE INSTRUCTIONS MAY RESULT IN THE CANCELLATION OF YOUR SURGERY PATIENT SIGNATURE_________________________________  NURSE SIGNATURE__________________________________  ________________________________________________________________________  WHAT IS A BLOOD TRANSFUSION? Blood Transfusion Information  A transfusion is the replacement of blood or some of its parts. Blood is made up of multiple cells which provide different functions.  Red blood cells carry oxygen and are used for blood loss  replacement.  White blood cells fight against infection.  Platelets control bleeding.  Plasma helps clot blood.  Other blood products are available for specialized needs, such as hemophilia or other clotting disorders. BEFORE THE TRANSFUSION  Who gives blood for transfusions?   Healthy volunteers who are fully evaluated to make sure their blood is safe. This is blood bank blood. Transfusion therapy is the safest it has ever been in the practice of medicine. Before blood is taken from a donor, a complete history is taken to make sure that person has no history of diseases nor engages in risky social behavior (examples are intravenous drug use or sexual activity with multiple partners). The donor's travel history is screened to minimize risk of transmitting infections, such as malaria. The donated blood is tested for signs of infectious diseases, such as HIV and hepatitis. The blood is then tested to be sure it is compatible with you in order to minimize the chance of a transfusion reaction. If you or a relative donates blood, this is often done in anticipation of surgery and is not appropriate for emergency situations. It takes many days to process the donated blood. RISKS AND COMPLICATIONS Although transfusion therapy is very safe and saves many lives, the main dangers of transfusion include:  1. Getting an infectious disease. 2. Developing a transfusion reaction. This is an allergic reaction to something in the blood you were given. Every precaution is taken to prevent this. The decision to have a blood transfusion has been considered carefully by your caregiver before blood is given. Blood is not given unless the benefits outweigh the risks. AFTER THE TRANSFUSION  Right after receiving a blood transfusion, you will usually feel much better and  more energetic. This is especially true if your red blood cells have gotten low (anemic). The transfusion raises the level of the red blood cells which  carry oxygen, and this usually causes an energy increase.  The nurse administering the transfusion will monitor you carefully for complications. HOME CARE INSTRUCTIONS  No special instructions are needed after a transfusion. You may find your energy is better. Speak with your caregiver about any limitations on activity for underlying diseases you may have. SEEK MEDICAL CARE IF:   Your condition is not improving after your transfusion.  You develop redness or irritation at the intravenous (IV) site. SEEK IMMEDIATE MEDICAL CARE IF:  Any of the following symptoms occur over the next 12 hours:  Shaking chills.  You have a temperature by mouth above 102 F (38.9 C), not controlled by medicine.  Chest, back, or muscle pain.  People around you feel you are not acting correctly or are confused.  Shortness of breath or difficulty breathing.  Dizziness and fainting.  You get a rash or develop hives.  You have a decrease in urine output.  Your urine turns a dark color or changes to pink, red, or brown. Any of the following symptoms occur over the next 10 days:  You have a temperature by mouth above 102 F (38.9 C), not controlled by medicine.  Shortness of breath.  Weakness after normal activity.  The white part of the eye turns yellow (jaundice).  You have a decrease in the amount of urine or are urinating less often.  Your urine turns a dark color or changes to pink, red, or brown. Document Released: 11/25/2000 Document Revised: 02/20/2012 Document Reviewed: 07/14/2008 ExitCare Patient Information 2014 [place].  _______________________________________________________________________  Incentive Spirometer  An incentive spirometer is a tool that can help keep your lungs clear and active. This tool measures how well you are filling your lungs with each breath. Taking long deep breaths may help reverse or decrease the chance of developing breathing (pulmonary) problems  (especially infection) following:  A long period of time when you are unable to move or be active. BEFORE THE PROCEDURE   If the spirometer includes an indicator to show your best effort, your nurse or respiratory therapist will set it to a desired goal.  If possible, sit up straight or lean slightly forward. Try not to slouch.  Hold the incentive spirometer in an upright position. INSTRUCTIONS FOR USE  3. Sit on the edge of your bed if possible, or sit up as far as you can in bed or on a chair. 4. Hold the incentive spirometer in an upright position. 5. Breathe out normally. 6. Place the mouthpiece in your mouth and seal your lips tightly around it. 7. Breathe in slowly and as deeply as possible, raising the piston or the ball toward the top of the column. 8. Hold your breath for 3-5 seconds or for as long as possible. Allow the piston or ball to fall to the bottom of the column. 9. Remove the mouthpiece from your mouth and breathe out normally. 10. Rest for a few seconds and repeat Steps 1 through 7 at least 10 times every 1-2 hours when you are awake. Take your time and take a few normal breaths between deep breaths. 11. The spirometer may include an indicator to show your best effort. Use the indicator as a goal to work toward during each repetition. 12. After each set of 10 deep breaths, practice coughing to be sure your lungs  are clear. If you have an incision (the cut made at the time of surgery), support your incision when coughing by placing a pillow or rolled up towels firmly against it. Once you are able to get out of bed, walk around indoors and cough well. You may stop using the incentive spirometer when instructed by your caregiver.  RISKS AND COMPLICATIONS  Take your time so you do not get dizzy or light-headed.  If you are in pain, you may need to take or ask for pain medication before doing incentive spirometry. It is harder to take a deep breath if you are having  pain. AFTER USE  Rest and breathe slowly and easily.  It can be helpful to keep track of a log of your progress. Your caregiver can provide you with a simple table to help with this. If you are using the spirometer at home, follow these instructions: [place] IF:   You are having difficultly using the spirometer.  You have trouble using the spirometer as often as instructed.  Your pain medication is not giving enough relief while using the spirometer.  You develop fever of 100.5 F (38.1 C) or higher. SEEK IMMEDIATE MEDICAL CARE IF:   You cough up bloody sputum that had not been present before.  You develop fever of 102 F (38.9 C) or greater.  You develop worsening pain at or near the incision site. MAKE SURE YOU:   Understand these instructions.  Will watch your condition.  Will get help right away if you are not doing well or get worse. Document Released: 04/10/2007 Document Revised: 02/20/2012 Document Reviewed: 06/11/2007 [facility] Patient Information 2014 [place], [place].   ________________________________________________________________________

## 2015-04-28 ENCOUNTER — Encounter (HOSPITAL_COMMUNITY)
Admission: RE | Admit: 2015-04-28 | Discharge: 2015-04-28 | Disposition: A | Discharge status: Home / Self Care | Payer: Medicare Other | Source: Ambulatory Visit | Attending: Orthopedic Surgery | Admitting: Orthopedic Surgery

## 2015-04-28 ENCOUNTER — Encounter (HOSPITAL_COMMUNITY): Payer: Self-pay

## 2015-04-28 DIAGNOSIS — Z01812 Encounter for preprocedural laboratory examination: Secondary | ICD-10-CM | POA: Insufficient documentation

## 2015-04-28 DIAGNOSIS — M1711 Unilateral primary osteoarthritis, right knee: Secondary | ICD-10-CM | POA: Insufficient documentation

## 2015-04-28 HISTORY — DX: Tinnitus, unspecified ear: H93.19

## 2015-04-28 HISTORY — DX: Other intervertebral disc degeneration, lumbar region: M51.36

## 2015-04-28 HISTORY — DX: Pneumonia, unspecified organism: J18.9

## 2015-04-28 HISTORY — DX: Personal history of other infectious and parasitic diseases: Z86.19

## 2015-04-28 LAB — COMPREHENSIVE METABOLIC PANEL
ALK PHOS: 45 U/L (ref 38–126)
ALT: 24 U/L (ref 17–63)
AST: 16 U/L (ref 15–41)
Albumin: 4.3 g/dL (ref 3.5–5.0)
Anion gap: 10 (ref 5–15)
BILIRUBIN TOTAL: 0.8 mg/dL (ref 0.3–1.2)
BUN: 20 mg/dL (ref 6–20)
CHLORIDE: 106 mmol/L (ref 101–111)
CO2: 27 mmol/L (ref 22–32)
Calcium: 10 mg/dL (ref 8.9–10.3)
Creatinine, Ser: 0.93 mg/dL (ref 0.61–1.24)
GFR calc Af Amer: >60 mL/min (ref >60)
GFR calc non Af Amer: >60 mL/min (ref >60)
Glucose, Bld: 104 mg/dL — ABNORMAL HIGH (ref 65–99)
Potassium: 4.5 mmol/L (ref 3.5–5.1)
Sodium: 143 mmol/L (ref 135–145)
Total Protein: 7.8 g/dL (ref 6.5–8.1)

## 2015-04-28 LAB — URINALYSIS, ROUTINE W REFLEX MICROSCOPIC
Bilirubin Urine: NEGATIVE
Glucose, UA: NEGATIVE mg/dL
Hgb urine dipstick: NEGATIVE
KETONES UR: NEGATIVE mg/dL
LEUKOCYTES UA: NEGATIVE
NITRITE: NEGATIVE
Protein, ur: NEGATIVE mg/dL
Specific Gravity, Urine: 1.022 (ref 1.005–1.030)
Urobilinogen, UA: 0.2 mg/dL (ref 0.0–1.0)
pH: 6.5 (ref 5.0–8.0)

## 2015-04-28 LAB — CBC
HEMATOCRIT: 45.8 % (ref 39.0–52.0)
HEMOGLOBIN: 14.8 g/dL (ref 13.0–17.0)
MCH: 28.8 pg (ref 26.0–34.0)
MCHC: 32.3 g/dL (ref 30.0–36.0)
MCV: 89.3 fL (ref 78.0–100.0)
Platelets: 264 10*3/uL (ref 150–400)
RBC: 5.13 MIL/uL (ref 4.22–5.81)
RDW: 12.9 % (ref 11.5–15.5)
WBC: 5.8 10*3/uL (ref 4.0–10.5)

## 2015-04-28 LAB — SURGICAL PCR SCREEN
MRSA, PCR: NEGATIVE
Staphylococcus aureus: NEGATIVE

## 2015-04-28 LAB — ABO/RH: ABO/RH(D): O NEG

## 2015-04-28 LAB — PROTIME-INR
INR: 0.95 (ref 0.00–1.49)
Prothrombin Time: 12.8 seconds (ref 11.6–15.2)

## 2015-04-28 LAB — APTT: aPTT: 34 seconds (ref 24–37)

## 2015-05-04 ENCOUNTER — Encounter: Payer: Self-pay | Admitting: Physician Assistant

## 2015-05-04 LAB — TYPE AND SCREEN
ABO/RH(D): O NEG
Antibody Screen: NEGATIVE

## 2015-05-08 ENCOUNTER — Telehealth: Payer: Self-pay | Admitting: *Deleted

## 2015-05-08 NOTE — Telephone Encounter (Signed)
Per message 05/08/15:  Patient scheduled for nurse visit 05/08/15.   Message    Did you send this message? He does need Prevnar and can be scheduled for a nurse visit for this.      Einar Pheasant   ----- Message -----    From: Irven Baltimore    Sent: 05/07/2015 11:59 AM     To: Brunetta Jeans, PA-C   Subject: FW: Appointment Request (HM)               Does patient need this? Thanks!    ----- Message -----    From: Nita Sells    Sent: 05/07/2015 10:30 AM     To: [facility] Admin Pool   Subject: Appointment Request (HM)                 Appointment Request From: Nita Sells      With Provider: Leeanne Rio, PA-C [-Primary Care Physician-]      Preferred Date Range: From 05/13/2015 To 05/14/2015      Preferred Times: Thursday Morning, Wednesday Afternoon, Thursday Afternoon      Reason: To address the following health maintenance concerns.   Pna Vac Low Risk Adult      Comments:

## 2015-05-11 ENCOUNTER — Encounter: Payer: Self-pay | Admitting: Physician Assistant

## 2015-05-11 DIAGNOSIS — E119 Type 2 diabetes mellitus without complications: Secondary | ICD-10-CM

## 2015-05-12 ENCOUNTER — Ambulatory Visit: Payer: Self-pay | Admitting: Orthopedic Surgery

## 2015-05-12 MED ORDER — INSULIN PEN NEEDLE 31G X 8 MM MISC: Status: DC

## 2015-05-12 NOTE — Progress Notes (Signed)
Preoperative surgical orders have been place into the Epic hospital system for Matthew Nash on 05/12/2015, 5:22 PM  by Mickel Crow for surgery on 05-27-2015.  Preop Total Knee orders including Experal, IV Tylenol, and IV Decadron as long as there are no contraindications to the above medications. [name], PA-C

## 2015-05-13 ENCOUNTER — Ambulatory Visit (INDEPENDENT_AMBULATORY_CARE_PROVIDER_SITE_OTHER): Payer: Medicare Other | Admitting: *Deleted

## 2015-05-13 DIAGNOSIS — Z23 Encounter for immunization: Secondary | ICD-10-CM

## 2015-05-13 NOTE — Progress Notes (Signed)
Pre visit review using our clinic review tool, if applicable. No additional management support is needed unless otherwise documented below in the visit note.  Per phone note 05/08/15 Ok to get Prevnar injection.  Patient tolerated injection well.

## 2015-05-14 ENCOUNTER — Encounter: Payer: Self-pay | Admitting: Physician Assistant

## 2015-05-14 MED ORDER — LISINOPRIL 10 MG PO TABS: 10.0000 mg | ORAL_TABLET | Freq: Every evening | ORAL | Status: DC

## 2015-05-19 ENCOUNTER — Ambulatory Visit: Payer: Medicare Other | Admitting: Physical Therapy

## 2015-05-21 ENCOUNTER — Ambulatory Visit: Payer: Self-pay | Admitting: Orthopedic Surgery

## 2015-05-21 NOTE — H&P (Signed)
Matthew Nash DOB: 1936-05-26 Married / Language: English / Race: White Male Date of Admission:  05/27/2015 CC:  Right Knee Pain History of Present Illness The patient is a 79 year old male who comes in today for a preoperative History and Physical. The patient is scheduled for a right total knee arthroplasty to be performed by Dr. Dione Plover. [name], MD at [facility] on 05-27-2015. The patient is a 79 year old male who presents today for follow up of their knee. The patient is being followed for their bilateral knee pain and osteoarthritis. They are now several months out from right knee cortisone injection. Symptoms reported today include: pain and aching. The patient feels that they are doing poorly (continued pain on the right). Current treatment includes: NSAIDs (Celebrex). The patient has reported improvement of their symptoms with: Cortisone injections (helped the left knee but not the right knee). The patient indicates that they have questions or concerns today regarding pain and their progress at this point. Unfortunately, his right knee has gotten progressively worse. Injections have not provided any benefit. He has had viscosupplements in the past, which also did not help. He feels as though the knee is hurting it all times. He has a bone on bone feel to the knee. Pain is worse lateral than medial. We did the arthroscopy. He had a large meniscal tear, but also had bone on bone change in the lateral compartment. This was done last year. He never got full resolution of symptoms. They have been treated conservatively in the past for the above stated problem and despite conservative measures, they continue to have progressive pain and severe functional limitations and dysfunction. They have failed non-operative management including home exercise, medications, and injections. It is felt that they would benefit from undergoing total joint replacement. Risks and benefits of the procedure  have been discussed with the patient and they elect to proceed with surgery. There are no active contraindications to surgery such as ongoing infection or rapidly progressive neurological disease.  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 just SHRIMP - Able to use Topical Betadine Albamycin *Anti-infective Agents - Misc.** Rash. Penicillins Rash. Adolescent Years  Family History Heart Disease mother Cancer mother Other medical problems Sister died due to sever asthmatic attack Severe allergy First Degree Relatives. brother Heart disease in male family member before age 91 Hypertension First Degree Relatives. mother and father  Social History Number of flights of stairs before winded 2-3 Marital status married Living situation live with spouse Pain Contract no Previously in rehab no Tobacco / smoke exposure yes outdoors only Alcohol use Occasional alcohol use. current drinker; drinks beer, wine and hard liquor; only occasionally per week Tobacco use Former smoker. former smoker; smoke(d) 3 or more pack(s) per day Children 6 Illicit drug use no Exercise Exercises rarely; does individual sport and other Current work status retired Engineer, agricultural (Currently) no Advance Directives Living Will [place] with Wife  Medication History  Fiber (Oral) Active. Fenofibrate (145MG  Tablet, Oral) Active. Probiotic (Oral)  Active. MetFORMIN HCl ER (1000mg  Oral two times daily) Specific dose unknown - Active. (bid 1000mg ) Victoza (18MG /3ML Solution,  Subcutaneous) Active. (1.8 mg qd) CeleBREX (200MG  Capsule, 1 Oral daily) Active. Omeprazole (20MG  Capsule DR, Oral) Active. (qd) Lisinopril (10MG  Tablet, Oral) Active. (qd) Simvastatin (10MG  Tablet, Oral) Active. (qd) Aspirin Low Dose (81MG  Tablet DR, Oral) Active. (qd) Calcium-Magnesium (1 Oral) Active. (qd) Multiple Vitamin (1 Oral) Active. (qd) Vitamin C (500MG  Tablet, 1 Oral) Active. (qd)  Past Surgical History  Arthroscopy of Shoulder right Gallbladder Surgery Date: 1993. laporoscopic Arthroscopy of Knee bilateral: right 2015 Vasectomy Rotator Cuff Repair Left - 2010, Right - 2012 Tonsillectomy Date: 1942. Neck Disc Surgery Prostatectomy; Transurethral Date: 2006. Cholecystectomy Rotator Cuff Repair - Left Back Surgery L2-L3 Date: 2015.   Review of Systems General Not Present- Chills, Fatigue, Fever, Memory Loss, Night Sweats, Weight Gain and Weight Loss. Skin Not Present- Eczema, Hives, Itching, Lesions and Rash. HEENT Present- Tinnitus. Not Present- Dentures, Double Vision, Headache, Hearing Loss and Visual Loss. Respiratory Present- Shortness of breath with exertion. Not Present- Allergies, Chronic Cough, Coughing up blood and Shortness of breath at rest. Cardiovascular Not Present- Chest Pain, Difficulty Breathing Lying Down, Murmur, Palpitations, Racing/skipping heartbeats and Swelling. Gastrointestinal Not Present- Abdominal Pain, Bloody Stool, Constipation, Diarrhea, Difficulty Swallowing, Heartburn, Jaundice, Loss of appetitie, Nausea and Vomiting. Male Genitourinary Present- Urinating at Night. Not Present- Blood in Urine, Discharge, Flank Pain, Incontinence, Painful Urination, Urgency, Urinary frequency, Urinary Retention and Weak urinary stream. Musculoskeletal Present- Joint Pain. Not Present- Back Pain, Joint  Swelling, Morning Stiffness, Muscle Pain, Muscle Weakness and Spasms. Neurological Not Present- Blackout spells, Difficulty with balance, Dizziness, Paralysis, Tremor and Weakness. Psychiatric Not Present- Insomnia.  Vitals Weight: 220 lb Height: 71in Body Surface Area: 2.2 m Body Mass Index: 30.68 kg/m  BP: 132/72 (Sitting, Right Arm, Standard)  Physical Exam General Mental Status -Alert, cooperative and good historian. General Appearance-pleasant, Not in acute distress. Orientation-Oriented X3. Build & Nutrition-Well nourished and Well developed.  Head and Neck Head-normocephalic, atraumatic . Neck Global Assessment - supple, no bruit auscultated on the right, no bruit auscultated on the left.  Eye Vision-Wears corrective lenses. Pupil - Bilateral-Regular and Round. Motion - Bilateral-EOMI.  Chest and Lung Exam Auscultation Breath sounds - clear at anterior chest wall and clear at posterior chest wall. Adventitious sounds - No Adventitious sounds.  Cardiovascular Auscultation Rhythm - Regular rate and rhythm. Heart Sounds - S1 WNL and S2 WNL. Murmurs & Other Heart Sounds - Auscultation of the heart reveals - No Murmurs.  Abdomen Palpation/Percussion Tenderness - Abdomen is non-tender to palpation. Rigidity (guarding) - Abdomen is soft. Auscultation Auscultation of the abdomen reveals - Bowel sounds normal.  Male Genitourinary Note: Not done, not pertinent to present illness   Musculoskeletal Note: On exam, he is a well-developed male alert and oriented and in no apparent distress. Evaluation of his hips show normal range of motion with no discomfort. His left knee shows no effusion. Range of motion is 0 to 135. No tenderness or instability. The right knee has no effusion. Range of motion is 5 to 130 degrees. There is moderate crepitus on range of motion and there is some tenderness lateral greater than medial with no instability noted.  Pulses, sensation, and motor are intact. '  RADIOGRAPHS: AP of both knees and lateral show that he has advanced arthritic change in the lateral compartment as well as patellofemoral arthritis. These x-rays were from a few months ago. I also reviewed his scope pictures showing the large area of bone on bone in the lateral compartment.   Assessment & Plan Primary localized osteoarthritis  of right knee (M17.11) Note:Surgical Plans: Right Total Knee Replacement  Disposition: Home  PCP: Elyn Aquas, [facility] - Patient has been seen preoperatively and felt to be stable for surgery. [place]  IV TXA  Anesthesia Issues: NONE  Signed electronically by Joelene Millin, III PA-C

## 2015-05-26 ENCOUNTER — Encounter: Payer: Self-pay | Admitting: Physician Assistant

## 2015-05-26 DIAGNOSIS — E119 Type 2 diabetes mellitus without complications: Secondary | ICD-10-CM

## 2015-05-26 MED ORDER — METFORMIN HCL ER 500 MG PO TB24: 500.0000 mg | ORAL_TABLET | Freq: Two times a day (BID) | ORAL | Status: DC

## 2015-05-26 MED ORDER — FENOFIBRATE 145 MG PO TABS: 145.0000 mg | ORAL_TABLET | Freq: Every evening | ORAL | Status: DC

## 2015-05-27 ENCOUNTER — Encounter (HOSPITAL_COMMUNITY): Payer: Self-pay | Admitting: *Deleted

## 2015-05-27 ENCOUNTER — Encounter (HOSPITAL_COMMUNITY)
Admission: RE | Disposition: A | Discharge status: Home Health | Payer: Self-pay | Source: Ambulatory Visit | Attending: Orthopedic Surgery

## 2015-05-27 ENCOUNTER — Inpatient Hospital Stay (HOSPITAL_COMMUNITY): Payer: Medicare Other | Admitting: Certified Registered Nurse Anesthetist

## 2015-05-27 ENCOUNTER — Inpatient Hospital Stay (HOSPITAL_COMMUNITY)
Admission: RE | Admit: 2015-05-27 | Discharge: 2015-05-29 | DRG: 470 | Disposition: A | Discharge status: Home Health | Payer: Medicare Other | Source: Ambulatory Visit | Attending: Orthopedic Surgery | Admitting: Orthopedic Surgery

## 2015-05-27 DIAGNOSIS — Z87891 Personal history of nicotine dependence: Secondary | ICD-10-CM | POA: Diagnosis not present

## 2015-05-27 DIAGNOSIS — M179 Osteoarthritis of knee, unspecified: Secondary | ICD-10-CM | POA: Diagnosis not present

## 2015-05-27 DIAGNOSIS — I10 Essential (primary) hypertension: Secondary | ICD-10-CM | POA: Diagnosis present

## 2015-05-27 DIAGNOSIS — Z9079 Acquired absence of other genital organ(s): Secondary | ICD-10-CM | POA: Diagnosis present

## 2015-05-27 DIAGNOSIS — E785 Hyperlipidemia, unspecified: Secondary | ICD-10-CM | POA: Diagnosis present

## 2015-05-27 DIAGNOSIS — M25561 Pain in right knee: Secondary | ICD-10-CM | POA: Diagnosis not present

## 2015-05-27 DIAGNOSIS — M1711 Unilateral primary osteoarthritis, right knee: Secondary | ICD-10-CM | POA: Diagnosis not present

## 2015-05-27 DIAGNOSIS — K219 Gastro-esophageal reflux disease without esophagitis: Secondary | ICD-10-CM | POA: Diagnosis present

## 2015-05-27 DIAGNOSIS — E119 Type 2 diabetes mellitus without complications: Secondary | ICD-10-CM | POA: Diagnosis present

## 2015-05-27 DIAGNOSIS — M1710 Unilateral primary osteoarthritis, unspecified knee: Secondary | ICD-10-CM | POA: Diagnosis present

## 2015-05-27 DIAGNOSIS — Z85820 Personal history of malignant melanoma of skin: Secondary | ICD-10-CM | POA: Diagnosis not present

## 2015-05-27 DIAGNOSIS — Z01812 Encounter for preprocedural laboratory examination: Secondary | ICD-10-CM

## 2015-05-27 DIAGNOSIS — Z8249 Family history of ischemic heart disease and other diseases of the circulatory system: Secondary | ICD-10-CM

## 2015-05-27 DIAGNOSIS — Z7982 Long term (current) use of aspirin: Secondary | ICD-10-CM | POA: Diagnosis not present

## 2015-05-27 DIAGNOSIS — Z79899 Other long term (current) drug therapy: Secondary | ICD-10-CM

## 2015-05-27 HISTORY — PX: TOTAL KNEE ARTHROPLASTY: SHX125

## 2015-05-27 LAB — GLUCOSE, CAPILLARY
GLUCOSE-CAPILLARY: 112 mg/dL — AB (ref 65–99)
GLUCOSE-CAPILLARY: 109 mg/dL — AB (ref 65–99)
GLUCOSE-CAPILLARY: 129 mg/dL — AB (ref 65–99)
Glucose-Capillary: 202 mg/dL — ABNORMAL HIGH (ref 65–99)

## 2015-05-27 LAB — TYPE AND SCREEN
ABO/RH(D): O NEG
ANTIBODY SCREEN: NEGATIVE

## 2015-05-27 SURGERY — ARTHROPLASTY, KNEE, TOTAL
Anesthesia: Monitor Anesthesia Care | Site: Knee | Laterality: Right

## 2015-05-27 MED ORDER — CEFAZOLIN SODIUM-DEXTROSE 2-3 GM-% IV SOLR: 2.0000 g | INTRAVENOUS | Status: DC

## 2015-05-27 MED ORDER — ONDANSETRON HCL 4 MG/2ML IJ SOLN
INTRAMUSCULAR | Status: AC
  Filled 2015-05-27: qty 2

## 2015-05-27 MED ORDER — BUPIVACAINE-EPINEPHRINE 0.25% -1:200000 IJ SOLN
INTRAMUSCULAR | Status: AC
  Filled 2015-05-27: qty 1

## 2015-05-27 MED ORDER — BUPIVACAINE IN DEXTROSE 0.75-8.25 % IT SOLN
INTRATHECAL | Status: DC | PRN
  Administered 2015-05-27: 1.8 mL via INTRATHECAL

## 2015-05-27 MED ORDER — ONDANSETRON HCL 4 MG/2ML IJ SOLN
INTRAMUSCULAR | Status: DC | PRN
  Administered 2015-05-27: 4 mg via INTRAVENOUS

## 2015-05-27 MED ORDER — CEFAZOLIN SODIUM-DEXTROSE 2-3 GM-% IV SOLR
INTRAVENOUS | Status: AC
  Filled 2015-05-27: qty 50

## 2015-05-27 MED ORDER — CEFAZOLIN SODIUM-DEXTROSE 2-3 GM-% IV SOLR
2.0000 g | INTRAVENOUS | Status: AC
  Administered 2015-05-27: 2 g via INTRAVENOUS

## 2015-05-27 MED ORDER — SODIUM CHLORIDE 0.9 % IR SOLN
Status: DC | PRN
  Administered 2015-05-27: 1000 mL

## 2015-05-27 MED ORDER — SIMVASTATIN 10 MG PO TABS
10.0000 mg | ORAL_TABLET | Freq: Every day | ORAL | Status: DC
  Administered 2015-05-27 – 2015-05-28 (×2): 10 mg via ORAL
  Filled 2015-05-27 (×3): qty 1

## 2015-05-27 MED ORDER — SODIUM CHLORIDE 0.45 % IV SOLN
INTRAVENOUS | Status: DC
  Administered 2015-05-27: 100 mL/h via INTRAVENOUS
  Administered 2015-05-28: 04:00:00 via INTRAVENOUS

## 2015-05-27 MED ORDER — OXYCODONE HCL 5 MG/5ML PO SOLN
5.0000 mg | Freq: Once | ORAL | Status: DC | PRN
  Filled 2015-05-27: qty 5

## 2015-05-27 MED ORDER — PROPOFOL INFUSION 10 MG/ML OPTIME
INTRAVENOUS | Status: DC | PRN
  Administered 2015-05-27: 75 ug/kg/min via INTRAVENOUS

## 2015-05-27 MED ORDER — METFORMIN HCL ER 500 MG PO TB24
500.0000 mg | ORAL_TABLET | Freq: Two times a day (BID) | ORAL | Status: DC
  Administered 2015-05-29: 500 mg via ORAL
  Filled 2015-05-27 (×5): qty 1

## 2015-05-27 MED ORDER — ONDANSETRON HCL 4 MG/2ML IJ SOLN: 4.0000 mg | Freq: Four times a day (QID) | INTRAMUSCULAR | Status: DC | PRN

## 2015-05-27 MED ORDER — DEXAMETHASONE SODIUM PHOSPHATE 10 MG/ML IJ SOLN
10.0000 mg | Freq: Once | INTRAMUSCULAR | Status: DC
  Filled 2015-05-27: qty 1

## 2015-05-27 MED ORDER — OXYCODONE HCL 5 MG PO TABS
5.0000 mg | ORAL_TABLET | ORAL | Status: DC | PRN
  Administered 2015-05-27: 5 mg via ORAL
  Filled 2015-05-27: qty 1

## 2015-05-27 MED ORDER — SODIUM CHLORIDE 0.9 % IV SOLN: INTRAVENOUS | Status: DC

## 2015-05-27 MED ORDER — FENTANYL CITRATE (PF) 100 MCG/2ML IJ SOLN
INTRAMUSCULAR | Status: AC
  Filled 2015-05-27: qty 2

## 2015-05-27 MED ORDER — BUPIVACAINE LIPOSOME 1.3 % IJ SUSP
20.0000 mL | Freq: Once | INTRAMUSCULAR | Status: DC
  Filled 2015-05-27: qty 20

## 2015-05-27 MED ORDER — ACETAMINOPHEN 500 MG PO TABS
1000.0000 mg | ORAL_TABLET | Freq: Four times a day (QID) | ORAL | Status: AC
  Administered 2015-05-27 – 2015-05-28 (×3): 1000 mg via ORAL
  Filled 2015-05-27 (×4): qty 2

## 2015-05-27 MED ORDER — RIVAROXABAN 10 MG PO TABS
10.0000 mg | ORAL_TABLET | Freq: Every day | ORAL | Status: DC
  Administered 2015-05-28 – 2015-05-29 (×2): 10 mg via ORAL
  Filled 2015-05-27 (×4): qty 1

## 2015-05-27 MED ORDER — PROPOFOL 10 MG/ML IV BOLUS
INTRAVENOUS | Status: AC
  Filled 2015-05-27: qty 20

## 2015-05-27 MED ORDER — ACETAMINOPHEN 10 MG/ML IV SOLN
1000.0000 mg | Freq: Once | INTRAVENOUS | Status: AC
  Administered 2015-05-27: 1000 mg via INTRAVENOUS

## 2015-05-27 MED ORDER — LIRAGLUTIDE 18 MG/3ML ~~LOC~~ SOPN
1.8000 mg | PEN_INJECTOR | Freq: Every day | SUBCUTANEOUS | Status: DC
  Administered 2015-05-28 – 2015-05-29 (×2): 1.8 mg via SUBCUTANEOUS

## 2015-05-27 MED ORDER — ONDANSETRON HCL 4 MG PO TABS
4.0000 mg | ORAL_TABLET | Freq: Four times a day (QID) | ORAL | Status: DC | PRN
Start: 2015-05-27 — End: 2015-05-29

## 2015-05-27 MED ORDER — OXYCODONE HCL 5 MG PO TABS: 5.0000 mg | ORAL_TABLET | Freq: Once | ORAL | Status: DC | PRN

## 2015-05-27 MED ORDER — DIPHENHYDRAMINE HCL 12.5 MG/5ML PO ELIX: 12.5000 mg | ORAL_SOLUTION | ORAL | Status: DC | PRN

## 2015-05-27 MED ORDER — METOCLOPRAMIDE HCL 5 MG/ML IJ SOLN: 5.0000 mg | Freq: Three times a day (TID) | INTRAMUSCULAR | Status: DC | PRN

## 2015-05-27 MED ORDER — SODIUM CHLORIDE 0.9 % IJ SOLN
INTRAMUSCULAR | Status: AC
  Filled 2015-05-27: qty 50

## 2015-05-27 MED ORDER — DEXAMETHASONE SODIUM PHOSPHATE 10 MG/ML IJ SOLN
INTRAMUSCULAR | Status: AC
  Filled 2015-05-27: qty 1

## 2015-05-27 MED ORDER — MIDAZOLAM HCL 5 MG/5ML IJ SOLN
INTRAMUSCULAR | Status: DC | PRN
  Administered 2015-05-27 (×2): 0.5 mg via INTRAVENOUS

## 2015-05-27 MED ORDER — DEXAMETHASONE SODIUM PHOSPHATE 10 MG/ML IJ SOLN
10.0000 mg | Freq: Once | INTRAMUSCULAR | Status: AC
  Administered 2015-05-27: 10 mg via INTRAVENOUS

## 2015-05-27 MED ORDER — FLEET ENEMA 7-19 GM/118ML RE ENEM: 1.0000 | ENEMA | Freq: Once | RECTAL | Status: AC | PRN

## 2015-05-27 MED ORDER — BUPIVACAINE HCL (PF) 0.25 % IJ SOLN
INTRAMUSCULAR | Status: AC
  Filled 2015-05-27: qty 30

## 2015-05-27 MED ORDER — PANTOPRAZOLE SODIUM 40 MG PO TBEC
40.0000 mg | DELAYED_RELEASE_TABLET | Freq: Every day | ORAL | Status: DC
  Administered 2015-05-28 – 2015-05-29 (×2): 40 mg via ORAL
  Filled 2015-05-27 (×2): qty 1

## 2015-05-27 MED ORDER — MIDAZOLAM HCL 2 MG/2ML IJ SOLN
INTRAMUSCULAR | Status: AC
  Filled 2015-05-27: qty 2

## 2015-05-27 MED ORDER — MORPHINE SULFATE 2 MG/ML IJ SOLN: 1.0000 mg | INTRAMUSCULAR | Status: DC | PRN

## 2015-05-27 MED ORDER — 0.9 % SODIUM CHLORIDE (POUR BTL) OPTIME
TOPICAL | Status: DC | PRN
  Administered 2015-05-27: 1000 mL

## 2015-05-27 MED ORDER — BISACODYL 10 MG RE SUPP: 10.0000 mg | Freq: Every day | RECTAL | Status: DC | PRN

## 2015-05-27 MED ORDER — LIDOCAINE HCL (CARDIAC) 20 MG/ML IV SOLN
INTRAVENOUS | Status: AC
Start: 2015-05-27 — End: 2015-05-27
  Filled 2015-05-27: qty 5

## 2015-05-27 MED ORDER — BUPIVACAINE LIPOSOME 1.3 % IJ SUSP
INTRAMUSCULAR | Status: DC | PRN
  Administered 2015-05-27: 20 mL

## 2015-05-27 MED ORDER — VANCOMYCIN HCL IN DEXTROSE 1-5 GM/200ML-% IV SOLN: 1000.0000 mg | INTRAVENOUS | Status: DC

## 2015-05-27 MED ORDER — TRANEXAMIC ACID 1000 MG/10ML IV SOLN
1000.0000 mg | INTRAVENOUS | Status: AC
  Administered 2015-05-27: 1000 mg via INTRAVENOUS
  Filled 2015-05-27: qty 10

## 2015-05-27 MED ORDER — SODIUM CHLORIDE 0.9 % IJ SOLN
INTRAMUSCULAR | Status: DC | PRN
  Administered 2015-05-27: 50 mL via INTRAVENOUS

## 2015-05-27 MED ORDER — BUPIVACAINE HCL 0.25 % IJ SOLN
INTRAMUSCULAR | Status: DC | PRN
Start: 2015-05-27 — End: 2015-05-27
  Administered 2015-05-27: 30 mL

## 2015-05-27 MED ORDER — LIDOCAINE HCL (CARDIAC) 20 MG/ML IV SOLN
INTRAVENOUS | Status: DC | PRN
  Administered 2015-05-27: 100 mg via INTRAVENOUS

## 2015-05-27 MED ORDER — FENTANYL CITRATE (PF) 100 MCG/2ML IJ SOLN
25.0000 ug | INTRAMUSCULAR | Status: DC | PRN
  Administered 2015-05-27 (×2): 25 ug via INTRAVENOUS

## 2015-05-27 MED ORDER — CEFAZOLIN SODIUM-DEXTROSE 2-3 GM-% IV SOLR
2.0000 g | Freq: Four times a day (QID) | INTRAVENOUS | Status: AC
  Administered 2015-05-27 – 2015-05-28 (×2): 2 g via INTRAVENOUS
  Filled 2015-05-27 (×2): qty 50

## 2015-05-27 MED ORDER — FENTANYL CITRATE (PF) 100 MCG/2ML IJ SOLN
INTRAMUSCULAR | Status: DC | PRN
  Administered 2015-05-27 (×2): 25 ug via INTRAVENOUS

## 2015-05-27 MED ORDER — CHLORHEXIDINE GLUCONATE 4 % EX LIQD: 60.0000 mL | Freq: Once | CUTANEOUS | Status: DC

## 2015-05-27 MED ORDER — LACTATED RINGERS IV SOLN
INTRAVENOUS | Status: DC
  Administered 2015-05-27: 14:00:00 via INTRAVENOUS
  Administered 2015-05-27: 1000 mL via INTRAVENOUS

## 2015-05-27 MED ORDER — ACETAMINOPHEN 10 MG/ML IV SOLN
INTRAVENOUS | Status: AC
  Filled 2015-05-27: qty 100

## 2015-05-27 MED ORDER — POLYETHYLENE GLYCOL 3350 17 G PO PACK: 17.0000 g | PACK | Freq: Every day | ORAL | Status: DC | PRN

## 2015-05-27 MED ORDER — DOCUSATE SODIUM 100 MG PO CAPS
100.0000 mg | ORAL_CAPSULE | Freq: Two times a day (BID) | ORAL | Status: DC
  Administered 2015-05-27 – 2015-05-29 (×4): 100 mg via ORAL

## 2015-05-27 MED ORDER — MENTHOL 3 MG MT LOZG: 1.0000 | LOZENGE | OROMUCOSAL | Status: DC | PRN

## 2015-05-27 MED ORDER — INSULIN ASPART 100 UNIT/ML ~~LOC~~ SOLN: 0.0000 [IU] | Freq: Three times a day (TID) | SUBCUTANEOUS | Status: DC

## 2015-05-27 MED ORDER — ACETAMINOPHEN 325 MG PO TABS
650.0000 mg | ORAL_TABLET | Freq: Four times a day (QID) | ORAL | Status: DC | PRN
  Administered 2015-05-28: 650 mg via ORAL
  Filled 2015-05-27: qty 2

## 2015-05-27 MED ORDER — TAMSULOSIN HCL 0.4 MG PO CAPS
0.4000 mg | ORAL_CAPSULE | Freq: Every evening | ORAL | Status: DC
Start: 2015-05-27 — End: 2015-05-29
  Administered 2015-05-27 – 2015-05-28 (×2): 0.4 mg via ORAL
  Filled 2015-05-27 (×3): qty 1

## 2015-05-27 MED ORDER — DEXTROSE 5 % IV SOLN
500.0000 mg | Freq: Four times a day (QID) | INTRAVENOUS | Status: DC | PRN
  Administered 2015-05-27: 500 mg via INTRAVENOUS
  Filled 2015-05-27 (×2): qty 5

## 2015-05-27 MED ORDER — METHOCARBAMOL 500 MG PO TABS: 500.0000 mg | ORAL_TABLET | Freq: Four times a day (QID) | ORAL | Status: DC | PRN

## 2015-05-27 MED ORDER — ACETAMINOPHEN 650 MG RE SUPP: 650.0000 mg | Freq: Four times a day (QID) | RECTAL | Status: DC | PRN

## 2015-05-27 MED ORDER — TRAMADOL HCL 50 MG PO TABS: 50.0000 mg | ORAL_TABLET | Freq: Four times a day (QID) | ORAL | Status: DC | PRN

## 2015-05-27 MED ORDER — PHENOL 1.4 % MT LIQD: 1.0000 | OROMUCOSAL | Status: DC | PRN

## 2015-05-27 MED ORDER — METOCLOPRAMIDE HCL 10 MG PO TABS: 5.0000 mg | ORAL_TABLET | Freq: Three times a day (TID) | ORAL | Status: DC | PRN

## 2015-05-27 SURGICAL SUPPLY — 63 items
BAG DECANTER FOR FLEXI CONT (MISCELLANEOUS) ×3 IMPLANT
BAG SPEC THK2 15X12 ZIP CLS (MISCELLANEOUS) ×1
BAG ZIPLOCK 12X15 (MISCELLANEOUS) ×3 IMPLANT
BANDAGE ELASTIC 6 VELCRO ST LF (GAUZE/BANDAGES/DRESSINGS) ×3 IMPLANT
BANDAGE ESMARK 6X9 LF (GAUZE/BANDAGES/DRESSINGS) ×1 IMPLANT
BLADE SAG 18X100X1.27 (BLADE) ×3 IMPLANT
BLADE SAW SGTL 11.0X1.19X90.0M (BLADE) ×3 IMPLANT
BNDG CMPR 9X6 STRL LF SNTH (GAUZE/BANDAGES/DRESSINGS) ×1
BNDG ESMARK 6X9 LF (GAUZE/BANDAGES/DRESSINGS) ×3
BOWL SMART MIX CTS (DISPOSABLE) ×3 IMPLANT
CAP KNEE TOTAL 3 SIGMA ×2 IMPLANT
CEMENT HV SMART SET (Cement) ×6 IMPLANT
CLOSURE WOUND 1/2 X4 (GAUZE/BANDAGES/DRESSINGS) ×1
CUFF TOURN SGL QUICK 34 (TOURNIQUET CUFF) ×3
CUFF TRNQT CYL 34X4X40X1 (TOURNIQUET CUFF) ×1 IMPLANT
DECANTER SPIKE VIAL GLASS SM (MISCELLANEOUS) ×3 IMPLANT
DRAPE EXTREMITY T 121X128X90 (DRAPE) ×3 IMPLANT
DRAPE POUCH INSTRU U-SHP 10X18 (DRAPES) ×3 IMPLANT
DRAPE U-SHAPE 47X51 STRL (DRAPES) ×3 IMPLANT
DRSG ADAPTIC 3X8 NADH LF (GAUZE/BANDAGES/DRESSINGS) ×3 IMPLANT
DRSG PAD ABDOMINAL 8X10 ST (GAUZE/BANDAGES/DRESSINGS) ×3 IMPLANT
DURAPREP 26ML APPLICATOR (WOUND CARE) ×3 IMPLANT
ELECT REM PT RETURN 9FT ADLT (ELECTROSURGICAL) ×3
ELECTRODE REM PT RTRN 9FT ADLT (ELECTROSURGICAL) ×1 IMPLANT
EVACUATOR 1/8 PVC DRAIN (DRAIN) ×3 IMPLANT
FACESHIELD WRAPAROUND (MASK) ×15 IMPLANT
FACESHIELD WRAPAROUND OR TEAM (MASK) ×5 IMPLANT
GAUZE SPONGE 4X4 12PLY STRL (GAUZE/BANDAGES/DRESSINGS) ×3 IMPLANT
GLOVE BIO SURGEON STRL SZ7.5 (GLOVE) IMPLANT
GLOVE BIO SURGEON STRL SZ8 (GLOVE) ×3 IMPLANT
GLOVE BIOGEL PI IND STRL 6.5 (GLOVE) IMPLANT
GLOVE BIOGEL PI IND STRL 8 (GLOVE) ×1 IMPLANT
GLOVE BIOGEL PI INDICATOR 6.5 (GLOVE)
GLOVE BIOGEL PI INDICATOR 8 (GLOVE) ×2
GLOVE SURG SS PI 6.5 STRL IVOR (GLOVE) IMPLANT
GOWN STRL REUS W/TWL LRG LVL3 (GOWN DISPOSABLE) ×3 IMPLANT
GOWN STRL REUS W/TWL XL LVL3 (GOWN DISPOSABLE) IMPLANT
HANDPIECE INTERPULSE COAX TIP (DISPOSABLE) ×3
IMMOBILIZER KNEE 20 (SOFTGOODS) ×2 IMPLANT
KIT BASIN OR (CUSTOM PROCEDURE TRAY) ×3 IMPLANT
MANIFOLD NEPTUNE II (INSTRUMENTS) ×3 IMPLANT
NDL SAFETY ECLIPSE 18X1.5 (NEEDLE) ×2 IMPLANT
NEEDLE HYPO 18GX1.5 SHARP (NEEDLE) ×6
NS IRRIG 1000ML POUR BTL (IV SOLUTION) ×3 IMPLANT
PACK TOTAL JOINT (CUSTOM PROCEDURE TRAY) ×3 IMPLANT
PADDING CAST COTTON 6X4 STRL (CAST SUPPLIES) ×5 IMPLANT
PEN SKIN MARKING BROAD (MISCELLANEOUS) ×3 IMPLANT
POSITIONER SURGICAL ARM (MISCELLANEOUS) ×3 IMPLANT
SET HNDPC FAN SPRY TIP SCT (DISPOSABLE) ×1 IMPLANT
STRIP CLOSURE SKIN 1/2X4 (GAUZE/BANDAGES/DRESSINGS) ×3 IMPLANT
SUCTION FRAZIER 12FR DISP (SUCTIONS) ×3 IMPLANT
SUT MNCRL AB 4-0 PS2 18 (SUTURE) ×3 IMPLANT
SUT VIC AB 2-0 CT1 27 (SUTURE) ×9
SUT VIC AB 2-0 CT1 TAPERPNT 27 (SUTURE) ×3 IMPLANT
SUT VLOC 180 0 24IN GS25 (SUTURE) ×3 IMPLANT
SYR 20CC LL (SYRINGE) ×3 IMPLANT
SYR 50ML LL SCALE MARK (SYRINGE) ×3 IMPLANT
TOWEL OR 17X26 10 PK STRL BLUE (TOWEL DISPOSABLE) ×3 IMPLANT
TOWEL OR NON WOVEN STRL DISP B (DISPOSABLE) IMPLANT
TRAY FOLEY CATH 16FR SILVER (SET/KITS/TRAYS/PACK) ×2 IMPLANT
WATER STERILE IRR 1500ML POUR (IV SOLUTION) ×3 IMPLANT
WRAP KNEE MAXI GEL POST OP (GAUZE/BANDAGES/DRESSINGS) ×3 IMPLANT
YANKAUER SUCT BULB TIP 10FT TU (MISCELLANEOUS) ×3 IMPLANT

## 2015-05-27 NOTE — Anesthesia Preprocedure Evaluation (Signed)
Anesthesia Evaluation  Patient identified by MRN, date of birth, ID band Patient awake    Reviewed: Allergy & Precautions, NPO status , Patient's Chart, lab work & pertinent test results  Airway Mallampati: II   Neck ROM: full    Dental   Pulmonary former smoker,  breath sounds clear to auscultation        Cardiovascular hypertension, Rhythm:regular Rate:Normal     Neuro/Psych    GI/Hepatic hiatal hernia, GERD-  ,  Endo/Other  diabetes, Type 2  Renal/GU      Musculoskeletal  (+) Arthritis -,   Abdominal   Peds  Hematology   Anesthesia Other Findings   Reproductive/Obstetrics                             Anesthesia Physical Anesthesia Plan  ASA: II  Anesthesia Plan: MAC and Spinal   Post-op Pain Management:    Induction: Intravenous  Airway Management Planned: Simple Face Mask  Additional Equipment:   Intra-op Plan:   Post-operative Plan:   Informed Consent: I have reviewed the patients History and Physical, chart, labs and discussed the procedure including the risks, benefits and alternatives for the proposed anesthesia with the patient or authorized representative who has indicated his/her understanding and acceptance.     Plan Discussed with: CRNA, Anesthesiologist and Surgeon  Anesthesia Plan Comments:         Anesthesia Quick Evaluation

## 2015-05-27 NOTE — Anesthesia Procedure Notes (Signed)
Spinal Patient location during procedure: OR Start time: 05/27/2015 12:58 PM End time: 05/27/2015 1:07 PM Staffing Anesthesiologist: Jakarius Flamenco Performed by: anesthesiologist  Preanesthetic Checklist Completed: patient identified, site marked, surgical consent, pre-op evaluation, timeout performed, IV checked, risks and benefits discussed and monitors and equipment checked Spinal Block Patient position: sitting Prep: Betadine Patient monitoring: heart rate, cardiac monitor, continuous pulse ox and blood pressure Approach: midline Location: L3-4 Injection technique: single-shot Needle Needle type: Pencan  Needle gauge: 24 G Needle length: 10 cm Assessment Sensory level: T6 Additional Notes Pt tolerated the procedure well.

## 2015-05-27 NOTE — Interval H&P Note (Signed)
History and Physical Interval Note:  05/27/2015 12:53 PM  Matthew Nash  has presented today for surgery, with the diagnosis of right knee osteoarthritis  The various methods of treatment have been discussed with the patient and family. After consideration of risks, benefits and other options for treatment, the patient has consented to  Procedure(s): RIGHT TOTAL KNEE ARTHROPLASTY (Right) as a surgical intervention .  The patient's history has been reviewed, patient examined, no change in status, stable for surgery.  I have reviewed the patient's chart and labs.  Questions were answered to the patient's satisfaction.     Gearlean Alf

## 2015-05-27 NOTE — H&P (View-Only) (Signed)
Matthew Nash DOB: [birth year] Married / Language: English / Race: White Male Date of Admission:  05/27/2015 CC:  Right Knee Pain History of Present Illness The patient is a 79 year old male who comes in today for a preoperative History and Physical. The patient is scheduled for a right total knee arthroplasty to be performed by Dr. Dione Plover. [name], MD at [facility] on 05-27-2015. The patient is a 79 year old male who presents today for follow up of their knee. The patient is being followed for their bilateral knee pain and osteoarthritis. They are now several months out from right knee cortisone injection. Symptoms reported today include: pain and aching. The patient feels that they are doing poorly (continued pain on the right). Current treatment includes: NSAIDs (Celebrex). The patient has reported improvement of their symptoms with: Cortisone injections (helped the left knee but not the right knee). The patient indicates that they have questions or concerns today regarding pain and their progress at this point. Unfortunately, his right knee has gotten progressively worse. Injections have not provided any benefit. He has had viscosupplements in the past, which also did not help. He feels as though the knee is hurting it all times. He has a bone on bone feel to the knee. Pain is worse lateral than medial. We did the arthroscopy. He had a large meniscal tear, but also had bone on bone change in the lateral compartment. This was done last year. He never got full resolution of symptoms. They have been treated conservatively in the past for the above stated problem and despite conservative measures, they continue to have progressive pain and severe functional limitations and dysfunction. They have failed non-operative management including home exercise, medications, and injections. It is felt that they would benefit from undergoing total joint replacement. Risks and benefits of the procedure  have been discussed with the patient and they elect to proceed with surgery. There are no active contraindications to surgery such as ongoing infection or rapidly progressive neurological disease.  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 just SHRIMP - Able to use Topical Betadine Albamycin *Anti-infective Agents - Misc.** Rash. Penicillins Rash. Adolescent Years  Family History Heart Disease mother Cancer mother Other medical problems Sister died due to sever asthmatic attack Severe allergy First Degree Relatives. brother Heart disease in male family member before age 30 Hypertension First Degree Relatives. mother and father  Social History Number of flights of stairs before winded 2-3 Marital status married Living situation live with spouse Pain Contract no Previously in rehab no Tobacco / smoke exposure yes outdoors only Alcohol use Occasional alcohol use. current drinker; drinks beer, wine and hard liquor; only occasionally per week Tobacco use Former smoker. 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 Fenofibrate (145MG  Tablet, Oral) Active. Probiotic (Oral)  Active. MetFORMIN HCl ER (1000mg  Oral two times daily) Specific dose unknown - Active. (bid 1000mg ) Victoza (18MG /3ML Solution,  Subcutaneous) Active. (1.8 mg qd) CeleBREX (200MG  Capsule, 1 Oral daily) Active. Omeprazole (20MG  Capsule DR, Oral) Active. (qd) Lisinopril (10MG  Tablet, Oral) Active. (qd) Simvastatin (10MG  Tablet, Oral) Active. (qd) Aspirin Low Dose (81MG  Tablet DR, Oral) Active. (qd) Calcium-Magnesium (1 Oral) Active. (qd) Multiple Vitamin (1 Oral) Active. (qd) Vitamin C (500MG  Tablet, 1 Oral) Active. (qd)  Past Surgical History  Arthroscopy of Shoulder right Gallbladder Surgery Date: 1993. laporoscopic Arthroscopy of Knee bilateral: right 2015 Vasectomy Rotator Cuff Repair Left - 2010, Right - 2012 Tonsillectomy Date: 1942. Neck Disc Surgery Prostatectomy; Transurethral Date: 2006. Cholecystectomy Rotator Cuff Repair - Left Back Surgery L2-L3 Date: 2015.   Review of Systems General Not Present- Chills, Fatigue, Fever, Memory Loss, Night Sweats, Weight Gain and Weight Loss. Skin Not Present- Eczema, Hives, Itching, Lesions and Rash. HEENT Present- Tinnitus. Not Present- Dentures, Double Vision, Headache, Hearing Loss and Visual Loss. Respiratory Present- Shortness of breath with exertion. Not Present- Allergies, Chronic Cough, Coughing up blood and Shortness of breath at rest. Cardiovascular Not Present- Chest Pain, Difficulty Breathing Lying Down, Murmur, Palpitations, Racing/skipping heartbeats and Swelling. Gastrointestinal Not Present- Abdominal Pain, Bloody Stool, Constipation, Diarrhea, Difficulty Swallowing, Heartburn, Jaundice, Loss of appetitie, Nausea and Vomiting. Male Genitourinary Present- Urinating at Night. Not Present- Blood in Urine, Discharge, Flank Pain, Incontinence, Painful Urination, Urgency, Urinary frequency, Urinary Retention and Weak urinary stream. Musculoskeletal Present- Joint Pain. Not Present- Back Pain, Joint  Swelling, Morning Stiffness, Muscle Pain, Muscle Weakness and Spasms. Neurological Not Present- Blackout spells, Difficulty with balance, Dizziness, Paralysis, Tremor and Weakness. Psychiatric Not Present- Insomnia.  Vitals Weight: 220 lb Height: 71in Body Surface Area: 2.2 m Body Mass Index: 30.68 kg/m  BP: 132/72 (Sitting, Right Arm, Standard)  Physical Exam General Mental Status -Alert, cooperative and good historian. General Appearance-pleasant, Not in acute distress. Orientation-Oriented X3. Build & Nutrition-Well nourished and Well developed.  Head and Neck Head-normocephalic, atraumatic . Neck Global Assessment - supple, no bruit auscultated on the right, no bruit auscultated on the left.  Eye Vision-Wears corrective lenses. Pupil - Bilateral-Regular and Round. Motion - Bilateral-EOMI.  Chest and Lung Exam Auscultation Breath sounds - clear at anterior chest wall and clear at posterior chest wall. Adventitious sounds - No Adventitious sounds.  Cardiovascular Auscultation Rhythm - Regular rate and rhythm. Heart Sounds - S1 WNL and S2 WNL. Murmurs & Other Heart Sounds - Auscultation of the heart reveals - No Murmurs.  Abdomen Palpation/Percussion Tenderness - Abdomen is non-tender to palpation. Rigidity (guarding) - Abdomen is soft. Auscultation Auscultation of the abdomen reveals - Bowel sounds normal.  Male Genitourinary Note: Not done, not pertinent to present illness   Musculoskeletal Note: On exam, he is a well-developed male alert and oriented and in no apparent distress. Evaluation of his hips show normal range of motion with no discomfort. His left knee shows no effusion. Range of motion is 0 to 135. No tenderness or instability. The right knee has no effusion. Range of motion is 5 to 130 degrees. There is moderate crepitus on range of motion and there is some tenderness lateral greater than medial with no instability noted.  Pulses, sensation, and motor are intact. '  RADIOGRAPHS: AP of both knees and lateral show that he has advanced arthritic change in the lateral compartment as well as patellofemoral arthritis. These x-rays were from a few months ago. I also reviewed his scope pictures showing the large area of bone on bone in the lateral compartment.   Assessment & Plan Primary localized osteoarthritis  of right knee (M17.11) Note:Surgical Plans: Right Total Knee Replacement  Disposition: Home  PCP: Elyn Aquas, [facility] - Patient has been seen preoperatively and felt to be stable for surgery. Olivia Lopez de Gutierrez  IV TXA  Anesthesia Issues: NONE  Signed electronically by Joelene Millin, III PA-C

## 2015-05-27 NOTE — Transfer of Care (Signed)
Immediate Anesthesia Transfer of Care Note  Patient: Matthew Nash  Procedure(s) Performed: Procedure(s): RIGHT TOTAL KNEE ARTHROPLASTY (Right)  Patient Location: PACU  Anesthesia Type:MAC and Spinal  Level of Consciousness:  sedated, patient cooperative and responds to stimulation  Airway & Oxygen Therapy:Patient Spontanous Breathing and Patient connected to face mask oxgen  Post-op Assessment:  Report given to PACU RN and Post -op Vital signs reviewed and stable  Post vital signs:  Reviewed and stable  Last Vitals:  Filed Vitals:   05/27/15 1027  BP: 114/67  Pulse: 87  Temp: 36.4 C  Resp: 18    Complications: No apparent anesthesia complications

## 2015-05-27 NOTE — Anesthesia Postprocedure Evaluation (Signed)
  Anesthesia Post-op Note  Patient: Matthew Nash  Procedure(s) Performed: Procedure(s): RIGHT TOTAL KNEE ARTHROPLASTY (Right)  Patient Location: PACU  Anesthesia Type:Spinal  Level of Consciousness: awake, alert  and oriented  Airway and Oxygen Therapy: Patient Spontanous Breathing  Post-op Pain: none  Post-op Assessment: Post-op Vital signs reviewed, Patient's Cardiovascular Status Stable and Respiratory Function Stable LLE Motor Response: Purposeful movement LLE Sensation: Numbness RLE Motor Response: Purposeful movement RLE Sensation: Numbness L Sensory Level: L2-Upper inner thigh, upper buttock R Sensory Level: L2-Upper inner thigh, upper buttock  Post-op Vital Signs: Reviewed and stable  Last Vitals:  Filed Vitals:   05/27/15 1515  BP: 124/69  Pulse: 65  Temp:   Resp: 17    Complications: No apparent anesthesia complications

## 2015-05-27 NOTE — Op Note (Signed)
Pre-operative diagnosis- Osteoarthritis  Right knee(s)  Post-operative diagnosis- Osteoarthritis Right knee(s)  Procedure-  Right  Total Knee Arthroplasty  Surgeon- Dione Plover. Jabrea Kallstrom, MD  Assistant- Molli Barrows, PA-C   Anesthesia-  Spinal  EBL-* No blood loss amount entered *   Drains Hemovac  Tourniquet time-  Total Tourniquet Time Documented: Thigh (Right) - 39 minutes Total: Thigh (Right) - 39 minutes     Complications- None  Condition-PACU - hemodynamically stable.   Brief Clinical Note  Matthew Nash is a 79 y.o. year old male with end stage OA of his right knee with progressively worsening pain and dysfunction. He has constant pain, with activity and at rest and significant functional deficits with difficulties even with ADLs. He has had extensive non-op management including analgesics, injections of cortisone and viscosupplements, and home exercise program, but remains in significant pain with significant dysfunction. Radiographs show bone on bone arthritis lateral and patellofemoral. He presents now for right Total Knee Arthroplasty.    Procedure in detail---   The patient is brought into the operating room and positioned supine on the operating table. After successful administration of  Spinal,   a tourniquet is placed high on the  Right thigh(s) and the lower extremity is prepped and draped in the usual sterile fashion. Time out is performed by the operating team and then the  Right lower extremity is wrapped in Esmarch, knee flexed and the tourniquet inflated to 300 mmHg.       A midline incision is made with a ten blade through the subcutaneous tissue to the level of the extensor mechanism. A fresh blade is used to make a medial parapatellar arthrotomy. Soft tissue over the proximal medial tibia is subperiosteally elevated to the joint line with a knife and into the semimembranosus bursa with a Cobb elevator. Soft tissue over the proximal lateral tibia is elevated with  attention being paid to avoiding the patellar tendon on the tibial tubercle. The patella is everted, knee flexed 90 degrees and the ACL and PCL are removed. Findings are bone on bone lateral and patellofemoral with large global osteophytes.        The drill is used to create a starting hole in the distal femur and the canal is thoroughly irrigated with sterile saline to remove the fatty contents. The 5 degree Right  valgus alignment guide is placed into the femoral canal and the distal femoral cutting block is pinned to remove 10 mm off the distal femur. Resection is made with an oscillating saw.      The tibia is subluxed forward and the menisci are removed. The extramedullary alignment guide is placed referencing proximally at the medial aspect of the tibial tubercle and distally along the second metatarsal axis and tibial crest. The block is pinned to remove 34mm off the more deficient lateral  side. Resection is made with an oscillating saw. Size 5is the most appropriate size for the tibia and the proximal tibia is prepared with the modular drill and keel punch for that size.      The femoral sizing guide is placed and size 5 is most appropriate. Rotation is marked off the epicondylar axis and confirmed by creating a rectangular flexion gap at 90 degrees. The size 5 cutting block is pinned in this rotation and the anterior, posterior and chamfer cuts are made with the oscillating saw. The intercondylar block is then placed and that cut is made.      Trial size 5 tibial component,  trial size 5 posterior stabilized femur and a 10  mm posterior stabilized rotating platform insert trial is placed. Full extension is achieved with excellent varus/valgus and anterior/posterior balance throughout full range of motion. The patella is everted and thickness measured to be 27  mm. Free hand resection is taken to 15 mm, a 41 template is placed, lug holes are drilled, trial patella is placed, and it tracks normally.  Osteophytes are removed off the posterior femur with the trial in place. All trials are removed and the cut bone surfaces prepared with pulsatile lavage. Cement is mixed and once ready for implantation, the size 5 tibial implant, size  5 posterior stabilized femoral component, and the size 41 patella are cemented in place and the patella is held with the clamp. The trial insert is placed and the knee held in full extension. The Exparel (20 ml mixed with 30 ml saline) and .25% Bupivicaine, are injected into the extensor mechanism, posterior capsule, medial and lateral gutters and subcutaneous tissues.  All extruded cement is removed and once the cement is hard the permanent 10 mm posterior stabilized rotating platform insert is placed into the tibial tray.      The wound is copiously irrigated with saline solution and the extensor mechanism closed over a hemovac drain with #1 V-loc suture. The tourniquet is released for a total tourniquet time of 39  minutes. Flexion against gravity is 140 degrees and the patella tracks normally. Subcutaneous tissue is closed with 2.0 vicryl and subcuticular with running 4.0 Monocryl. The incision is cleaned and dried and steri-strips and a bulky sterile dressing are applied. The limb is placed into a knee immobilizer and the patient is awakened and transported to recovery in stable condition.      Please note that a surgical assistant was a medical necessity for this procedure in order to perform it in a safe and expeditious manner. Surgical assistant was necessary to retract the ligaments and vital neurovascular structures to prevent injury to them and also necessary for proper positioning of the limb to allow for anatomic placement of the prosthesis.   Dione Plover Shelle Galdamez, MD    05/27/2015, 2:15 PM

## 2015-05-28 ENCOUNTER — Encounter (HOSPITAL_COMMUNITY): Payer: Self-pay | Admitting: Orthopedic Surgery

## 2015-05-28 LAB — BASIC METABOLIC PANEL
ANION GAP: 8 (ref 5–15)
BUN: 18 mg/dL (ref 6–20)
CHLORIDE: 106 mmol/L (ref 101–111)
CO2: 26 mmol/L (ref 22–32)
Calcium: 9.1 mg/dL (ref 8.9–10.3)
Creatinine, Ser: 1.1 mg/dL (ref 0.61–1.24)
GFR calc Af Amer: >60 mL/min (ref >60)
GFR calc non Af Amer: >60 mL/min (ref >60)
Glucose, Bld: 194 mg/dL — ABNORMAL HIGH (ref 65–99)
Potassium: 4.1 mmol/L (ref 3.5–5.1)
Sodium: 140 mmol/L (ref 135–145)

## 2015-05-28 LAB — CBC
HCT: 39 % (ref 39.0–52.0)
Hemoglobin: 12.7 g/dL — ABNORMAL LOW (ref 13.0–17.0)
MCH: 28.6 pg (ref 26.0–34.0)
MCHC: 32.6 g/dL (ref 30.0–36.0)
MCV: 87.8 fL (ref 78.0–100.0)
PLATELETS: 247 10*3/uL (ref 150–400)
RBC: 4.44 MIL/uL (ref 4.22–5.81)
RDW: 12.8 % (ref 11.5–15.5)
WBC: 10.7 10*3/uL — ABNORMAL HIGH (ref 4.0–10.5)

## 2015-05-28 LAB — GLUCOSE, CAPILLARY
GLUCOSE-CAPILLARY: 149 mg/dL — AB (ref 65–99)
Glucose-Capillary: 159 mg/dL — ABNORMAL HIGH (ref 65–99)
Glucose-Capillary: 119 mg/dL — ABNORMAL HIGH (ref 65–99)
Glucose-Capillary: 175 mg/dL — ABNORMAL HIGH (ref 65–99)

## 2015-05-28 MED ORDER — OXYCODONE HCL 5 MG PO TABS: 5.0000 mg | ORAL_TABLET | ORAL | Status: DC | PRN

## 2015-05-28 MED ORDER — METHOCARBAMOL 500 MG PO TABS
500.0000 mg | ORAL_TABLET | Freq: Four times a day (QID) | ORAL | Status: DC | PRN
Start: 2015-05-28 — End: 2015-06-29

## 2015-05-28 MED ORDER — TRAMADOL HCL 50 MG PO TABS: 50.0000 mg | ORAL_TABLET | Freq: Four times a day (QID) | ORAL | Status: DC | PRN

## 2015-05-28 MED ORDER — RIVAROXABAN 10 MG PO TABS: 10.0000 mg | ORAL_TABLET | Freq: Every day | ORAL | Status: DC

## 2015-05-28 NOTE — Progress Notes (Signed)
   Subjective: 1 Day Post-Op Procedure(s) (LRB): RIGHT TOTAL KNEE ARTHROPLASTY (Right) Patient reports pain as mild.   We will start therapy today.  Plan is to go Home after hospital stay.  Objective: Vital signs in last 24 hours: Temp:  [97.4 F (36.3 C)-98.2 F (36.8 C)] 98 F (36.7 C) (06/16 0618) Pulse Rate:  [65-88] 79 (06/16 0618) Resp:  [11-21] 16 (06/16 0618) BP: (107-145)/(58-82) 117/61 mmHg (06/16 0618) SpO2:  [95 %-100 %] 100 % (06/16 0618) Weight:  [99.338 kg (219 lb)] 99.338 kg (219 lb) (06/15 1601)  Intake/Output from previous day:  Intake/Output Summary (Last 24 hours) at 05/28/15 0655 Last data filed at 05/28/15 8101  Gross per 24 hour  Intake   2450 ml  Output   2670 ml  Net   -220 ml    Intake/Output this shift: Total I/O In: 840 [P.O.:840] Out: 2310 [Urine:2300; Drains:10]  Labs:  Recent Labs  05/28/15 0410  HGB 12.7*    Recent Labs  05/28/15 0410  WBC 10.7*  RBC 4.44  HCT 39.0  PLT 247    Recent Labs  05/28/15 0410  NA 140  K 4.1  CL 106  CO2 26  BUN 18  CREATININE 1.10  GLUCOSE 194*  CALCIUM 9.1   No results for input(s): LABPT, INR in the last 72 hours.  EXAM General - Patient is Alert, Appropriate and Oriented Extremity - Neurologically intact Neurovascular intact No cellulitis present Compartment soft Dressing - dressing C/D/I Motor Function - intact, moving foot and toes well on exam.  Hemovac pulled without difficulty.  Past Medical History  Diagnosis Date  . BPH (benign prostatic hyperplasia)     hx s/p turp  . Hyperlipidemia     takes Simvastatin and Tricor daily-under control  . Joint pain     both knees  . Joint swelling     rt knee  . Chronic back pain     spondylosis  . Melanoma     right hand;basal cell carcinoma  . H/O hiatal hernia   . History of colon polyps   . Diverticulitis   . Neuropathy   . Prostate infection     currently taking macrobid  . Diarrhea     x 1 today  . History of  chicken pox   . Urinary incontinence   . UTI (lower urinary tract infection)   . Back pain at L4-L5 level   . Arthritis     both knees  . DDD (degenerative disc disease), lumbar   . History of kidney stones   . Diabetes mellitus     takes Metformin and Victoza daily-under control  . Hepatitis     B-with Mono and jaundice 1960  . GERD (gastroesophageal reflux disease)     takes Omeprazole daily-under control  . Hypertension     takes Lisinopril daily-under control  . H/O measles   . Pneumonia     hx of  . Tinnitus     Assessment/Plan: 1 Day Post-Op Procedure(s) (LRB): RIGHT TOTAL KNEE ARTHROPLASTY (Right) Principal Problem:   OA (osteoarthritis) of knee   Advance diet Up with therapy D/C IV fluids Plan for discharge tomorrow  DVT Prophylaxis - Xarelto Weight-Bearing as tolerated to right leg   Aryiana Klinkner V 05/28/2015, 6:55 AM

## 2015-05-28 NOTE — Evaluation (Signed)
Physical Therapy Evaluation Patient Details Name: Matthew Nash MRN: [id number] DOB: Oct 09, 1936 Today's Date: 05/28/2015   History of Present Illness  R TKR  Clinical Impression  Pt s/p R TKR presents with decreased R LE strength/ROM and post op pain limiting functional mobility.  Pt should progress well to dc home with family assist and HHPT follow up.    Follow Up Recommendations Home health PT    Equipment Recommendations  None recommended by PT    Recommendations for Other Services OT consult     Precautions / Restrictions Precautions Precautions: Knee;Fall Required Braces or Orthoses: Knee Immobilizer - Right Knee Immobilizer - Right: Discontinue once straight leg raise with < 10 degree lag Restrictions Weight Bearing Restrictions: No Other Position/Activity Restrictions: WBAT      Mobility  Bed Mobility Overal bed mobility: Needs Assistance Bed Mobility: Supine to Sit     Supine to sit: Min assist     General bed mobility comments: cues for sequence and use of UEs to self assist  Transfers Overall transfer level: Needs assistance Equipment used: Rolling walker (2 wheeled) Transfers: Sit to/from Stand Sit to Stand: Supervision         General transfer comment: cues for LE management and use of UEs to self assist  Ambulation/Gait Ambulation/Gait assistance: Min assist;Min guard Ambulation Distance (Feet): 68 Feet Assistive device: Rolling walker (2 wheeled) Gait Pattern/deviations: Step-to pattern;Decreased step length - right;Decreased step length - left;Shuffle;Trunk flexed Gait velocity: decr   General Gait Details: cues for sequence, posture and position from [employer]            Wheelchair Mobility    Modified Rankin (Stroke Patients Only)       Balance                                             Pertinent Vitals/Pain Pain Assessment: 0-10 Pain Score: 2  Pain Location: R knee Pain Descriptors / Indicators:  Aching;Sore Pain Intervention(s): Limited activity within patient's tolerance;Monitored during session    [place] expects to be discharged to:: Private residence Living Arrangements: Spouse/significant other Available Help at Discharge: Available 24 hours/day Type of Home: House Home Access: Stairs to enter   [employer] of Steps: 1 (3-4 inches) Home Layout: One level Home Equipment: Shower seat;Grab bars - tub/shower;Grab bars - toilet      Prior Function Level of Independence: Independent (occasionally used walking stick)               Hand Dominance   Dominant Hand: Right    Extremity/Trunk Assessment   Upper Extremity Assessment: Overall WFL for tasks assessed           Lower Extremity Assessment: Defer to PT evaluation RLE Deficits / Details: 3-/5 quads with AAROM at knee -10- 70    Cervical / Trunk Assessment: Normal  Communication   Communication: No difficulties  Cognition Arousal/Alertness: Awake/alert Behavior During Therapy: WFL for tasks assessed/performed Overall Cognitive Status: Within Functional Limits for tasks assessed                      General Comments      Exercises Total Joint Exercises Ankle Circles/Pumps: AROM;Both;15 reps;Supine Quad Sets: AROM;Both;10 reps;Supine Heel Slides: AAROM;Right;15 reps;Supine Straight Leg Raises: Right;15 reps;Supine;AAROM      Assessment/Plan    PT Assessment Patient  needs continued PT services  PT Diagnosis Difficulty walking   PT Problem List Decreased strength;Decreased range of motion;Decreased activity tolerance;Decreased mobility;Decreased knowledge of use of DME;Pain  PT Treatment Interventions DME instruction;Gait training;Stair training;Functional mobility training;Therapeutic activities;Therapeutic exercise;Patient/family education   PT Goals (Current goals can be found in the Care Plan section) Acute Rehab PT Goals Patient Stated Goal: to go  home tomorrow PT Goal Formulation: With patient Time For Goal Achievement: 06/04/15 Potential to Achieve Goals: Good    Frequency 7X/week   Barriers to discharge        Co-evaluation               End of Session Equipment Utilized During Treatment: Gait belt;Right knee immobilizer Activity Tolerance: Patient tolerated treatment well Patient left: in chair;with call bell/phone within reach Nurse Communication: Mobility status         Time: 8638-1771 PT Time Calculation (min) (ACUTE ONLY): 39 min   Charges:   PT Evaluation $Initial PT Evaluation Tier I: 1 Procedure PT Treatments $Gait Training: 8-22 mins $Therapeutic Exercise: 8-22 mins   PT G Codes:        Matthew Nash 2015/06/26, 12:24 PM

## 2015-05-28 NOTE — Progress Notes (Signed)
Occupational Therapy Evaluation Patient Details Name: Matthew Nash MRN: [id number] DOB: 27-Jan-1936 Today's Date: 05/28/2015    History of Present Illness R TKR   Clinical Impression   Patient presents to OT with decreased ADL independence as expected post-op R TKR. All education has been completed and patient has no further OT needs at this time. Will sign off.    Follow Up Recommendations  No OT follow up;Supervision/Assistance - 24 hour    Equipment Recommendations  None recommended by OT (has all needed DME)    Recommendations for Other Services PT consult     Precautions / Restrictions Precautions Precautions: Knee;Fall Required Braces or Orthoses: Knee Immobilizer - Right Knee Immobilizer - Right: Discontinue once straight leg raise with < 10 degree lag Restrictions Weight Bearing Restrictions: No Other Position/Activity Restrictions: WBAT      Mobility Bed Mobility                  Transfers Overall transfer level: Needs assistance Equipment used: Rolling walker (2 wheeled) Transfers: Sit to/from Stand Sit to Stand: Supervision              Balance                                            ADL Overall ADL's : Needs assistance/impaired Eating/Feeding: Independent;Sitting   Grooming: Wash/dry hands;Supervision/safety;Standing   Upper Body Bathing: Set up;Sitting   Lower Body Bathing: Minimal assistance;Sit to/from stand   Upper Body Dressing : Set up;Sitting   Lower Body Dressing: Moderate assistance;Sit to/from stand   Toilet Transfer: Supervision/safety;Ambulation;BSC;RW;Grab bars   Toileting- Clothing Manipulation and Hygiene: Supervision/safety;Sit to/from stand       Functional mobility during ADLs: Supervision/safety;Rolling walker General ADL Comments: Patient received in bathroom getting off toilet. Supervision with transfer, stood at sink to wash hands, ambulated back to chair with supervision with RW  and KI. Patient reports his wife will assist with LB self-care at home. Patient doing well with toileting; reports he has handicapped height toilet and rails around the toilet. His shower is a tub/shower with multiple grab bars and he has a shower seat. Verbal education on tub/shower transfer technique and patient verbalized understanding. 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 Will sign off.     Vision     Perception     Praxis      Pertinent Vitals/Pain Pain Assessment: 0-10 Pain Score: 2  Pain Location: R knee Pain Descriptors / Indicators: Aching;Sore Pain Intervention(s): Limited activity within patient's tolerance;Monitored during session     Hand Dominance Right   Extremity/Trunk Assessment Upper Extremity Assessment Upper Extremity Assessment: Overall WFL for tasks assessed   Lower Extremity Assessment Lower Extremity Assessment: Defer to PT evaluation   Cervical / Trunk Assessment Cervical / Trunk Assessment: Normal   Communication Communication Communication: No difficulties   Cognition Arousal/Alertness: Awake/alert Behavior During Therapy: WFL for tasks assessed/performed Overall Cognitive Status: Within Functional Limits for tasks assessed                     General Comments       Exercises       Shoulder Instructions      Home Living Family/patient expects to  be discharged to:: Private residence Living Arrangements: Spouse/significant other Available Help at Discharge: Available 24 hours/day Type of Home: House Home Access: Stairs to enter [employer] of Steps: 1 (3-4 inches)   Home Layout: One level     Bathroom Shower/Tub: Teacher, [employer]izer years/pre: Handicapped height Bathroom Accessibility: Yes How Accessible: Accessible via walker Home Equipment: Shower seat;Grab  bars - tub/shower;Grab bars - toilet          Prior Functioning/Environment Level of Independence: Independent (occasionally used walking stick)             OT Diagnosis: Acute pain   OT Problem List: Decreased range of motion;Pain   OT Treatment/Interventions:      OT Goals(Current goals can be found in the care plan section) Acute Rehab OT Goals Patient Stated Goal: to go home tomorrow OT Goal Formulation: All assessment and education complete, DC therapy  OT Frequency:     Barriers to D/C:            Co-evaluation              End of Session Equipment Utilized During Treatment: Rolling walker;Right knee immobilizer  Activity Tolerance: Patient tolerated treatment well Patient left: in chair;with call bell/phone within reach   Time: 7867-5449 OT Time Calculation (min): 19 min Charges:  OT General Charges $OT Visit: 1 Procedure OT Evaluation $Initial OT Evaluation Tier I: 1 Procedure G-Codes:    [place] A Jun 02, 2015, 11:08 AM

## 2015-05-28 NOTE — Discharge Instructions (Signed)
° °Dr. Frank [name] °Total Joint Specialist °[facility] °[address] °[place], [place] [address] °[phone] ° °TOTAL KNEE REPLACEMENT POSTOPERATIVE DIRECTIONS ° ° ° °Knee Rehabilitation, Guidelines Following Surgery  °Results after knee surgery are often greatly improved when you follow the exercise, range of motion and muscle strengthening exercises prescribed by your doctor. Safety measures are also important to protect the knee from further injury. Any time any of these exercises cause you to have increased pain or swelling in your knee joint, decrease the amount until you are comfortable again and slowly increase them. If you have problems or questions, call your caregiver or physical therapist for advice.  ° °HOME CARE INSTRUCTIONS  °Remove items at home which could result in a fall. This includes throw rugs or furniture in walking pathways.  °Continue medications as instructed at time of discharge. °You may have some home medications which will be placed on hold until you complete the course of blood thinner medication.  °You may start showering once you are discharged home but do not submerge the incision under water. Just pat the incision dry and apply a dry gauze dressing on daily. °Walk with walker as instructed.  °You may resume a sexual relationship in one month or when given the OK by  your doctor.  °· Use walker as long as suggested by your caregivers. °· Avoid periods of inactivity such as sitting longer than an hour when not asleep. This helps prevent blood clots.  °You may return to work once you are cleared by your doctor.  °Do not drive a car for 6 weeks or until released by you surgeon.  °· Do not drive while taking narcotics.  °Wear the elastic stockings for three weeks following surgery during the day but you may remove then at night. °Make sure you keep all of your appointments after your operation with all of your doctors and caregivers. You should call the  office at the above phone number and make an appointment for approximately two weeks after the date of your surgery. °Change the dressing daily and reapply a dry dressing each time. °Please pick up a stool softener and laxative for home use as long as you are requiring pain medications. °· ICE to the affected knee every three hours for 30 minutes at a time and then as needed for pain and swelling.  Continue to use ice on the knee for pain and swelling from surgery. You may notice swelling that will progress down to the foot and ankle.  This is normal after surgery.  Elevate the leg when you are not up walking on it.   °It is important for you to complete the blood thinner medication as prescribed by your doctor. °· Continue to use the breathing machine which will help keep your temperature down.  It is common for your temperature to cycle up and down following surgery, especially at night when you are not up moving around and exerting yourself.  The breathing machine keeps your lungs expanded and your temperature down. ° °RANGE OF MOTION AND STRENGTHENING EXERCISES  °Rehabilitation of the knee is important following a knee injury or an operation. After just a few days of immobilization, the muscles of the thigh which control the knee become weakened and shrink (atrophy). Knee exercises are designed to build up the tone and strength of the thigh muscles and to improve knee motion. Often times heat used for twenty to thirty minutes before working out will loosen up   your tissues and help with improving the range of motion but do not use heat for the first two weeks following surgery. These exercises can be done on a training (exercise) mat, on the floor, on a table or on a bed. Use what ever works the best and is most comfortable for you Knee exercises include:  Leg Lifts - While your knee is still immobilized in a splint or cast, you can do straight leg raises. Lift the leg to 60 degrees, hold for 3 sec, and slowly  lower the leg. Repeat 10-20 times 2-3 times daily. Perform this exercise against resistance later as your knee gets better.  Quad and Hamstring Sets - Tighten up the muscle on the front of the thigh (Quad) and hold for 5-10 sec. Repeat this 10-20 times hourly. Hamstring sets are done by pushing the foot backward against an object and holding for 5-10 sec. Repeat as with quad sets.  A rehabilitation program following serious knee injuries can speed recovery and prevent re-injury in the future due to weakened muscles. Contact your doctor or a physical therapist for more information on knee rehabilitation.   SKILLED REHAB INSTRUCTIONS: If the patient is transferred to a skilled rehab facility following release from the hospital, a list of the current medications will be sent to the facility for the patient to continue.  When discharged from the skilled rehab facility, please have the facility set up the patient's [place] prior to being released. Also, the skilled facility will be responsible for providing the patient with their medications at time of release from the facility to include their pain medication, the muscle relaxants, and their blood thinner medication. If the patient is still at the rehab facility at time of the two week follow up appointment, the skilled rehab facility will also need to assist the patient in arranging follow up appointment in our office and any transportation needs.  MAKE SURE YOU:  Understand these instructions.  Will watch your condition.  Will get help right away if you are not doing well or get worse.    Pick up stool softner and laxative for home use following surgery while on pain medications. Do not submerge incision under water. Please use good hand washing techniques while changing dressing each day. May shower starting three days after surgery. Please use a clean towel to pat the incision dry following showers. Continue to use ice for pain  and swelling after surgery. Do not use any lotions or creams on the incision until instructed by your surgeon.  Information on my medicine - XARELTO (Rivaroxaban)  This medication education was reviewed with me or my healthcare representative as part of my discharge preparation.  The pharmacist that spoke with me during my hospital stay was:  Minda Ditto, [facility]  Why was Xarelto prescribed for you? Xarelto was prescribed for you to reduce the risk of blood clots forming after orthopedic surgery. The medical term for these abnormal blood clots is venous thromboembolism (VTE).  What do you need to know about xarelto ? Take your Xarelto ONCE DAILY at the same time every day. You may take it either with or without food.  If you have difficulty swallowing the tablet whole, you may crush it and mix in applesauce just prior to taking your dose.  Take Xarelto exactly as prescribed by your doctor and DO NOT stop taking Xarelto without talking to the doctor who prescribed the medication.  Stopping without other VTE prevention medication  to take the place of Xarelto may increase your risk of developing a clot.  After discharge, you should have regular check-up appointments with your healthcare provider that is prescribing your Xarelto.    What do you do if you miss a dose? If you miss a dose, take it as soon as you remember on the same day then continue your regularly scheduled once daily regimen the next day. Do not take two doses of Xarelto on the same day.   Important Safety Information A possible side effect of Xarelto is bleeding. You should call your healthcare provider right away if you experience any of the following: ? Bleeding from an injury or your nose that does not stop. ? Unusual colored urine (red or dark brown) or unusual colored stools (red or black). ? Unusual bruising for unknown reasons. ? A serious fall or if you hit your head (even if there is no bleeding).  Some  medicines may interact with Xarelto and might increase your risk of bleeding while on Xarelto. To help avoid this, consult your healthcare provider or pharmacist prior to using any new prescription or non-prescription medications, including herbals, vitamins, non-steroidal anti-inflammatory drugs (NSAIDs) and supplements.  This website has more information on Xarelto: [email or web address]

## 2015-05-29 LAB — BASIC METABOLIC PANEL
ANION GAP: 8 (ref 5–15)
BUN: 17 mg/dL (ref 6–20)
CO2: 23 mmol/L (ref 22–32)
CREATININE: 0.92 mg/dL (ref 0.61–1.24)
Calcium: 8.8 mg/dL — ABNORMAL LOW (ref 8.9–10.3)
Chloride: 109 mmol/L (ref 101–111)
Glucose, Bld: 140 mg/dL — ABNORMAL HIGH (ref 65–99)
POTASSIUM: 3.5 mmol/L (ref 3.5–5.1)
Sodium: 140 mmol/L (ref 135–145)

## 2015-05-29 LAB — CBC
HCT: 36.7 % — ABNORMAL LOW (ref 39.0–52.0)
HEMOGLOBIN: 11.9 g/dL — AB (ref 13.0–17.0)
MCH: 28 pg (ref 26.0–34.0)
MCHC: 32.4 g/dL (ref 30.0–36.0)
MCV: 86.4 fL (ref 78.0–100.0)
Platelets: 226 10*3/uL (ref 150–400)
RBC: 4.25 MIL/uL (ref 4.22–5.81)
RDW: 13.2 % (ref 11.5–15.5)
WBC: 8.9 10*3/uL (ref 4.0–10.5)

## 2015-05-29 LAB — GLUCOSE, CAPILLARY: GLUCOSE-CAPILLARY: 145 mg/dL — AB (ref 65–99)

## 2015-05-29 NOTE — Progress Notes (Signed)
   Subjective: 2 Days Post-Op Procedure(s) (LRB): RIGHT TOTAL KNEE ARTHROPLASTY (Right) Patient reports pain as mild.   Plan is to go Home after hospital stay.  Objective: Vital signs in last 24 hours: Temp:  [97 F (36.1 C)-98.8 F (37.1 C)] 98.8 F (37.1 C) (06/16 2200) Pulse Rate:  [81-96] 93 (06/17 0718) Resp:  [14-16] 16 (06/17 0718) BP: (114-148)/(56-72) 129/56 mmHg (06/17 0718) SpO2:  [96 %-99 %] 99 % (06/17 0718)  Intake/Output from previous day:  Intake/Output Summary (Last 24 hours) at 05/29/15 0835 Last data filed at 05/29/15 0700  Gross per 24 hour  Intake    120 ml  Output   1625 ml  Net  -1505 ml    Intake/Output this shift:    Labs:  Recent Labs  05/28/15 0410 05/29/15 0415  HGB 12.7* 11.9*    Recent Labs  05/28/15 0410 05/29/15 0415  WBC 10.7* 8.9  RBC 4.44 4.25  HCT 39.0 36.7*  PLT 247 226    Recent Labs  05/28/15 0410 05/29/15 0415  NA 140 140  K 4.1 3.5  CL 106 109  CO2 26 23  BUN 18 17  CREATININE 1.10 0.92  GLUCOSE 194* 140*  CALCIUM 9.1 8.8*   No results for input(s): LABPT, INR in the last 72 hours.  EXAM General - Patient is Alert, Appropriate and Oriented Extremity - Neurologically intact Neurovascular intact No cellulitis present Compartment soft Dressing/Incision - clean, dry, no drainage Motor Function - intact, moving foot and toes well on exam.   Past Medical History  Diagnosis Date  . BPH (benign prostatic hyperplasia)     hx s/p turp  . Hyperlipidemia     takes Simvastatin and Tricor daily-under control  . Joint pain     both knees  . Joint swelling     rt knee  . Chronic back pain     spondylosis  . Melanoma     right hand;basal cell carcinoma  . H/O hiatal hernia   . History of colon polyps   . Diverticulitis   . Neuropathy   . Prostate infection     currently taking macrobid  . Diarrhea     x 1 today  . History of chicken pox   . Urinary incontinence   . UTI (lower urinary tract  infection)   . Back pain at L4-L5 level   . Arthritis     both knees  . DDD (degenerative disc disease), lumbar   . History of kidney stones   . Diabetes mellitus     takes Metformin and Victoza daily-under control  . Hepatitis     B-with Mono and jaundice 1960  . GERD (gastroesophageal reflux disease)     takes Omeprazole daily-under control  . Hypertension     takes Lisinopril daily-under control  . H/O measles   . Pneumonia     hx of  . Tinnitus     Assessment/Plan: 2 Days Post-Op Procedure(s) (LRB): RIGHT TOTAL KNEE ARTHROPLASTY (Right) Principal Problem:   OA (osteoarthritis) of knee   Discharge home with home health  DVT Prophylaxis - Xarelto Weight-Bearing as tolerated to right leg  Matthew Nash V 05/29/2015, 8:35 AM

## 2015-05-29 NOTE — Progress Notes (Signed)
Physical Therapy Treatment Patient Details Name: Matthew Nash MRN: [id number] DOB: [birth year] Today's Date: 05/29/2015    History of Present Illness R TKR    PT Comments    Was called to room by pt.  Spouse now present (was not earlier) and hand many questions.  Educated on HEP demonstrated and instructed following handout.  Instructed on proper tech and freq as well as ICE.  Instructed on KI.  All mobility questions addressed. Pt ready for D/C to home.   Follow Up Recommendations  Home health PT     Equipment Recommendations  None recommended by PT    Recommendations for Other Services       Precautions / Restrictions Precautions Precautions: Knee;Fall Precaution Comments: instructed on KI use for amb  Required Braces or Orthoses: Knee Immobilizer - Right Knee Immobilizer - Right: Discontinue once straight leg raise with < 10 degree lag Restrictions Weight Bearing Restrictions: No Other Position/Activity Restrictions: WBAT    Mobility  Bed Mobility  Transfers          Ambulation/Gait   Stairs Stairs:  (no stairs)          Wheelchair Mobility    Modified Rankin (Stroke Patients Only)       Balance                                    Cognition Arousal/Alertness: Awake/alert Behavior During Therapy: WFL for tasks assessed/performed Overall Cognitive Status: Within Functional Limits for tasks assessed                      Exercises      General Comments        Pertinent Vitals/Pain Pain Assessment: 0-10 Pain Score: 4  Pain Location: R knee Pain Descriptors / Indicators: Aching;Sore Pain Intervention(s): Monitored during session;Premedicated before session;Repositioned;Ice applied    Home Living                      Prior Function            PT Goals (current goals can now be found in the care plan section) Progress towards PT goals: Progressing toward goals    Frequency  7X/week    PT Plan       Co-evaluation             End of Session Equipment Utilized During Treatment: Gait belt;Right knee immobilizer Activity Tolerance: Patient tolerated treatment well       Time: 3785-8850 PT Time Calculation (min) (ACUTE ONLY): 10 min  Charges:  1 home management                  G Codes:      Rica Koyanagi  PTA [facility]  Acute  Rehab Pager      [phone]

## 2015-05-29 NOTE — Progress Notes (Signed)
Physical Therapy Treatment Patient Details Name: Matthew Nash MRN: [id number] DOB: July 17, 1936 Today's Date: 05/29/2015    History of Present Illness R TKR    PT Comments    POD # 2 am session.  Applied KI and instructed on use for amb.  Assisted OOB to amb in hallway.  Returned to room to perform TKR TE's following HEP handout.  Instructed on proper tech, freq and use of ICE after.   Follow Up Recommendations  Home health PT     Equipment Recommendations  None recommended by PT    Recommendations for Other Services       Precautions / Restrictions Precautions Precautions: Knee;Fall Precaution Comments: instructed on KI use for amb  Required Braces or Orthoses: Knee Immobilizer - Right Knee Immobilizer - Right: Discontinue once straight leg raise with < 10 degree lag Restrictions Weight Bearing Restrictions: No Other Position/Activity Restrictions: WBAT    Mobility  Bed Mobility Overal bed mobility: Needs Assistance Bed Mobility: Supine to Sit     Supine to sit: Supervision     General bed mobility comments: cues for sequence and use of UEs to self assist  Transfers Overall transfer level: Needs assistance Equipment used: Rolling walker (2 wheeled) Transfers: Sit to/from Stand Sit to Stand: Supervision         General transfer comment: cues for LE management and use of UEs to self assist  Ambulation/Gait Ambulation/Gait assistance: Supervision Ambulation Distance (Feet): 80 Feet Assistive device: Rolling walker (2 wheeled) Gait Pattern/deviations: Step-to pattern;Trunk flexed Gait velocity: decr   General Gait Details: cues for sequence, posture and position from RW   Stairs Stairs:  (no stairs)          Wheelchair Mobility    Modified Rankin (Stroke Patients Only)       Balance                                    Cognition Arousal/Alertness: Awake/alert Behavior During Therapy: WFL for tasks assessed/performed Overall  Cognitive Status: Within Functional Limits for tasks assessed                      Exercises   Total Knee Replacement TE's 10 reps B LE ankle pumps 10 reps towel squeezes 10 reps knee presses 10 reps heel slides  10 reps SAQ's 10 reps SLR's 10 reps ABD Followed by ICE     General Comments        Pertinent Vitals/Pain Pain Assessment: 0-10 Pain Score: 4  Pain Location: R knee Pain Descriptors / Indicators: Aching;Sore Pain Intervention(s): Monitored during session;Premedicated before session;Repositioned;Ice applied    Home Living                      Prior Function            PT Goals (current goals can now be found in the care plan section) Progress towards PT goals: Progressing toward goals    Frequency  7X/week    PT Plan      Co-evaluation             End of Session Equipment Utilized During Treatment: Gait belt;Right knee immobilizer Activity Tolerance: Patient tolerated treatment well       Time: 2947-6546 PT Time Calculation (min) (ACUTE ONLY): 40 min  Charges:  $Gait Training: 8-22 mins $Therapeutic Exercise: 8-22 mins $Therapeutic Activity: 8-22 mins  G Codes:      Rica Koyanagi  PTA [facility]  Acute  Rehab Pager      [phone]

## 2015-05-29 NOTE — Care Management Note (Signed)
Case Management Note  Patient Details  Name: Matthew Nash MRN: [id number] Date of Birth: March 14, 1936  Subjective/Objective:      79 yo male admitted with  OA of knee           Action/Plan:   Patient stated that he was already aware that Arville Go will follow up in the home for PT. Confirmed referral with Cleora Fleet rep.  Expected Discharge Date:                  Expected Discharge Plan:  [place]  In-House Referral:     Discharge planning Services  CM Consult  Post Acute Care Choice:    Choice offered to:  Patient  DME Arranged:    DME Agency:     HH Arranged:  PT HH Agency:  [facility]  Status of Service:  Completed, signed off  Medicare Important Message Given:    Date Medicare IM Given:    Medicare IM give by:    Date Additional Medicare IM Given:    Additional Medicare Important Message give by:     If discussed at [place] of Stay Meetings, dates discussed:    Additional Comments:  Scot Dock, RN 05/29/2015, 10:05 AM

## 2015-05-30 DIAGNOSIS — E785 Hyperlipidemia, unspecified: Secondary | ICD-10-CM | POA: Diagnosis not present

## 2015-05-30 DIAGNOSIS — I10 Essential (primary) hypertension: Secondary | ICD-10-CM | POA: Diagnosis not present

## 2015-05-30 DIAGNOSIS — Z96651 Presence of right artificial knee joint: Secondary | ICD-10-CM | POA: Diagnosis not present

## 2015-05-30 DIAGNOSIS — M47817 Spondylosis without myelopathy or radiculopathy, lumbosacral region: Secondary | ICD-10-CM | POA: Diagnosis not present

## 2015-05-30 DIAGNOSIS — Z471 Aftercare following joint replacement surgery: Secondary | ICD-10-CM | POA: Diagnosis not present

## 2015-05-30 DIAGNOSIS — E119 Type 2 diabetes mellitus without complications: Secondary | ICD-10-CM | POA: Diagnosis not present

## 2015-06-01 DIAGNOSIS — Z96651 Presence of right artificial knee joint: Secondary | ICD-10-CM | POA: Diagnosis not present

## 2015-06-01 DIAGNOSIS — E785 Hyperlipidemia, unspecified: Secondary | ICD-10-CM | POA: Diagnosis not present

## 2015-06-01 DIAGNOSIS — M47817 Spondylosis without myelopathy or radiculopathy, lumbosacral region: Secondary | ICD-10-CM | POA: Diagnosis not present

## 2015-06-01 DIAGNOSIS — I10 Essential (primary) hypertension: Secondary | ICD-10-CM | POA: Diagnosis not present

## 2015-06-01 DIAGNOSIS — E119 Type 2 diabetes mellitus without complications: Secondary | ICD-10-CM | POA: Diagnosis not present

## 2015-06-01 DIAGNOSIS — Z471 Aftercare following joint replacement surgery: Secondary | ICD-10-CM | POA: Diagnosis not present

## 2015-06-02 NOTE — Discharge Summary (Signed)
Physician Discharge Summary   Patient ID: Matthew Nash MRN: [id number] DOB/AGE: 1936-10-13 79 y.o.  Admit date: 05/27/2015 Discharge date: 05/29/2015  Primary Diagnosis: Primary osteoarthritis, right knee  Admission Diagnoses:  Past Medical History  Diagnosis Date  . BPH (benign prostatic hyperplasia)     hx s/p turp  . Hyperlipidemia     takes Simvastatin and Tricor daily-under control  . Joint pain     both knees  . Joint swelling     rt knee  . Chronic back pain     spondylosis  . Melanoma     right hand;basal cell carcinoma  . H/O hiatal hernia   . History of colon polyps   . Diverticulitis   . Neuropathy   . Prostate infection     currently taking macrobid  . Diarrhea     x 1 today  . History of chicken pox   . Urinary incontinence   . UTI (lower urinary tract infection)   . Back pain at L4-L5 level   . Arthritis     both knees  . DDD (degenerative disc disease), lumbar   . History of kidney stones   . Diabetes mellitus     takes Metformin and Victoza daily-under control  . Hepatitis     B-with Mono and jaundice 1960  . GERD (gastroesophageal reflux disease)     takes Omeprazole daily-under control  . Hypertension     takes Lisinopril daily-under control  . H/O measles   . Pneumonia     hx of  . Tinnitus    Discharge Diagnoses:   Principal Problem:   OA (osteoarthritis) of knee  Estimated body mass index is 30.56 kg/(m^2) as calculated from the following:   Height as of this encounter: _0  (1.803 m).   Weight as of this encounter: 99.338 kg (219 lb).  Procedure:  Procedure(s) (LRB): RIGHT TOTAL KNEE ARTHROPLASTY (Right)   Consults: None  HPI: The patient is a 79 year old male who presents today for follow up of their knee. The patient is being followed for their bilateral knee pain and osteoarthritis. They are now several months out from right knee cortisone injection. Symptoms reported today include: pain and aching. The patient feels that  they are doing poorly (continued pain on the right). Current treatment includes: NSAIDs (Celebrex). The patient has reported improvement of their symptoms with: Cortisone injections (helped the left knee but not the right knee). The patient indicates that they have questions or concerns today regarding pain and their progress at this point. Unfortunately, his right knee has gotten progressively worse. Injections have not provided any benefit. He has had viscosupplements in the past, which also did not help. He feels as though the knee is hurting it all times. He has a bone on bone feel to the knee. Pain is worse lateral than medial. We did the arthroscopy. He had a large meniscal tear, but also had bone on bone change in the lateral compartment. This was done last year. He never got full resolution of symptoms. They have been treated conservatively in the past for the above stated problem and despite conservative measures, they continue to have progressive pain and severe functional limitations and dysfunction. They have failed non-operative management including home exercise, medications, and injections. It is felt that they would benefit from undergoing total joint replacement. Risks and benefits of the procedure have been discussed with the patient and they elect to proceed with surgery. There are no active  contraindications to surgery such as ongoing infection or rapidly progressive neurological disease.  Laboratory Data: Admission on 05/27/2015, Discharged on 05/29/2015  Component Date Value Ref Range Status  . Glucose-Capillary 05/27/2015 112* 65 - 99 mg/dL Final  . Comment 1 05/27/2015 Notify RN   Final  . ABO/RH(D) 05/27/2015 O NEG   Final  . Antibody Screen 05/27/2015 NEG   Final  . Sample Expiration 05/27/2015 05/30/2015   Final  . Glucose-Capillary 05/27/2015 109* 65 - 99 mg/dL Final  . WBC 05/28/2015 10.7* 4.0 - 10.5 K/uL Final  . RBC 05/28/2015 4.44  4.22 - 5.81 MIL/uL Final  .  Hemoglobin 05/28/2015 12.7* 13.0 - 17.0 g/dL Final  . HCT 05/28/2015 39.0  39.0 - 52.0 % Final  . MCV 05/28/2015 87.8  78.0 - 100.0 fL Final  . MCH 05/28/2015 28.6  26.0 - 34.0 pg Final  . MCHC 05/28/2015 32.6  30.0 - 36.0 g/dL Final  . RDW 05/28/2015 12.8  11.5 - 15.5 % Final  . Platelets 05/28/2015 247  150 - 400 K/uL Final  . Sodium 05/28/2015 140  135 - 145 mmol/L Final  . Potassium 05/28/2015 4.1  3.5 - 5.1 mmol/L Final  . Chloride 05/28/2015 106  101 - 111 mmol/L Final  . CO2 05/28/2015 26  22 - 32 mmol/L Final  . Glucose, Bld 05/28/2015 194* 65 - 99 mg/dL Final  . BUN 05/28/2015 18  6 - 20 mg/dL Final  . Creatinine, Ser 05/28/2015 1.10  0.61 - 1.24 mg/dL Final  . Calcium 05/28/2015 9.1  8.9 - 10.3 mg/dL Final  . GFR calc non Af Amer 05/28/2015 >60  >60 mL/min Final  . GFR calc Af Amer 05/28/2015 >60  >60 mL/min Final   Comment: (NOTE) The eGFR has been calculated using the CKD EPI equation. This calculation has not been validated in all clinical situations. eGFR's persistently <60 mL/min signify possible Chronic Kidney Disease.   . Anion gap 05/28/2015 8  5 - 15 Final  . Glucose-Capillary 05/27/2015 129* 65 - 99 mg/dL Final  . Glucose-Capillary 05/27/2015 202* 65 - 99 mg/dL Final  . Comment 1 05/27/2015 Notify RN   Final  . Glucose-Capillary 05/28/2015 149* 65 - 99 mg/dL Final  . Comment 1 05/28/2015 Notify RN   Final  . Glucose-Capillary 05/28/2015 159* 65 - 99 mg/dL Final  . WBC 05/29/2015 8.9  4.0 - 10.5 K/uL Final  . RBC 05/29/2015 4.25  4.22 - 5.81 MIL/uL Final  . Hemoglobin 05/29/2015 11.9* 13.0 - 17.0 g/dL Final  . HCT 05/29/2015 36.7* 39.0 - 52.0 % Final  . MCV 05/29/2015 86.4  78.0 - 100.0 fL Final  . MCH 05/29/2015 28.0  26.0 - 34.0 pg Final  . MCHC 05/29/2015 32.4  30.0 - 36.0 g/dL Final  . RDW 05/29/2015 13.2  11.5 - 15.5 % Final  . Platelets 05/29/2015 226  150 - 400 K/uL Final  . Sodium 05/29/2015 140  135 - 145 mmol/L Final  . Potassium 05/29/2015 3.5   3.5 - 5.1 mmol/L Final  . Chloride 05/29/2015 109  101 - 111 mmol/L Final  . CO2 05/29/2015 23  22 - 32 mmol/L Final  . Glucose, Bld 05/29/2015 140* 65 - 99 mg/dL Final  . BUN 05/29/2015 17  6 - 20 mg/dL Final  . Creatinine, Ser 05/29/2015 0.92  0.61 - 1.24 mg/dL Final  . Calcium 05/29/2015 8.8* 8.9 - 10.3 mg/dL Final  . GFR calc non Af Amer 05/29/2015 >60  >60 mL/min  Final  . GFR calc Af Amer 05/29/2015 >60  >60 mL/min Final   Comment: (NOTE) The eGFR has been calculated using the CKD EPI equation. This calculation has not been validated in all clinical situations. eGFR's persistently <60 mL/min signify possible Chronic Kidney Disease.   . Anion gap 05/29/2015 8  5 - 15 Final  . Glucose-Capillary 05/28/2015 119* 65 - 99 mg/dL Final  . Glucose-Capillary 05/28/2015 175* 65 - 99 mg/dL Final  . Glucose-Capillary 05/29/2015 145* 65 - 99 mg/dL Final  Hospital Outpatient Visit on 04/28/2015  Component Date Value Ref Range Status  . MRSA, PCR 04/28/2015 NEGATIVE  NEGATIVE Final  . Staphylococcus aureus 04/28/2015 NEGATIVE  NEGATIVE Final   Comment:        The Xpert SA Assay (FDA approved for NASAL specimens in patients over 38 years of age), is one component of a comprehensive surveillance program.  Test performance has been validated by [facility] for patients greater than or equal to 45 year old. It is not intended to diagnose infection nor to guide or monitor treatment.   [name] Kitchen aPTT 04/28/2015 34  24 - 37 seconds Final  . WBC 04/28/2015 5.8  4.0 - 10.5 K/uL Final  . RBC 04/28/2015 5.13  4.22 - 5.81 MIL/uL Final  . Hemoglobin 04/28/2015 14.8  13.0 - 17.0 g/dL Final  . HCT 04/28/2015 45.8  39.0 - 52.0 % Final  . MCV 04/28/2015 89.3  78.0 - 100.0 fL Final  . MCH 04/28/2015 28.8  26.0 - 34.0 pg Final  . MCHC 04/28/2015 32.3  30.0 - 36.0 g/dL Final  . RDW 04/28/2015 12.9  11.5 - 15.5 % Final  . Platelets 04/28/2015 264  150 - 400 K/uL Final  . Sodium 04/28/2015 143  135 - 145 mmol/L  Final  . Potassium 04/28/2015 4.5  3.5 - 5.1 mmol/L Final  . Chloride 04/28/2015 106  101 - 111 mmol/L Final  . CO2 04/28/2015 27  22 - 32 mmol/L Final  . Glucose, Bld 04/28/2015 104* 65 - 99 mg/dL Final  . BUN 04/28/2015 20  6 - 20 mg/dL Final  . Creatinine, Ser 04/28/2015 0.93  0.61 - 1.24 mg/dL Final  . Calcium 04/28/2015 10.0  8.9 - 10.3 mg/dL Final  . Total Protein 04/28/2015 7.8  6.5 - 8.1 g/dL Final  . Albumin 04/28/2015 4.3  3.5 - 5.0 g/dL Final  . AST 04/28/2015 16  15 - 41 U/L Final  . ALT 04/28/2015 24  17 - 63 U/L Final  . Alkaline Phosphatase 04/28/2015 45  38 - 126 U/L Final  . Total Bilirubin 04/28/2015 0.8  0.3 - 1.2 mg/dL Final  . GFR calc non Af Amer 04/28/2015 >60  >60 mL/min Final  . GFR calc Af Amer 04/28/2015 >60  >60 mL/min Final   Comment: (NOTE) The eGFR has been calculated using the CKD EPI equation. This calculation has not been validated in all clinical situations. eGFR's persistently <60 mL/min signify possible Chronic Kidney Disease.   . Anion gap 04/28/2015 10  5 - 15 Final  . Prothrombin Time 04/28/2015 12.8  11.6 - 15.2 seconds Final  . INR 04/28/2015 0.95  0.00 - 1.49 Final  . Color, Urine 04/28/2015 YELLOW  YELLOW Final  . APPearance 04/28/2015 CLEAR  CLEAR Final  . Specific Gravity, Urine 04/28/2015 1.022  1.005 - 1.030 Final  . pH 04/28/2015 6.5  5.0 - 8.0 Final  . Glucose, UA 04/28/2015 NEGATIVE  NEGATIVE mg/dL Final  . Hgb urine  dipstick 04/28/2015 NEGATIVE  NEGATIVE Final  . Bilirubin Urine 04/28/2015 NEGATIVE  NEGATIVE Final  . Ketones, ur 04/28/2015 NEGATIVE  NEGATIVE mg/dL Final  . Protein, ur 04/28/2015 NEGATIVE  NEGATIVE mg/dL Final  . Urobilinogen, UA 04/28/2015 0.2  0.0 - 1.0 mg/dL Final  . Nitrite 04/28/2015 NEGATIVE  NEGATIVE Final  . Leukocytes, UA 04/28/2015 NEGATIVE  NEGATIVE Final   MICROSCOPIC NOT DONE ON URINES WITH NEGATIVE PROTEIN, BLOOD, LEUKOCYTES, NITRITE, OR GLUCOSE <1000 mg/dL.  . ABO/RH(D) 04/28/2015 O NEG   Final    . Antibody Screen 04/28/2015 NEG   Final  . Sample Expiration 04/28/2015 05/07/2015   Final  . ABO/RH(D) 04/28/2015 O NEG   Final       Hospital Course: Matthew Nash is a 79 y.o. who was admitted to [facility]. They were brought to the operating room on 05/27/2015 and underwent Procedure(s): RIGHT TOTAL KNEE ARTHROPLASTY.  Patient tolerated the procedure well and was later transferred to the recovery room and then to the orthopaedic floor for postoperative care.  They were given PO and IV analgesics for pain control following their surgery.  They were given 24 hours of postoperative antibiotics of  Anti-infectives    Start     Dose/Rate Route Frequency Ordered Stop   05/27/15 1800  ceFAZolin (ANCEF) IVPB 2 g/50 mL premix     2 g 100 mL/hr over 30 Minutes Intravenous Every 6 hours 05/27/15 1613 05/28/15 0048   05/27/15 1200  ceFAZolin (ANCEF) IVPB 2 g/50 mL premix     2 g 100 mL/hr over 30 Minutes Intravenous On call to O.R. 05/27/15 1146 05/27/15 1305   05/27/15 1044  ceFAZolin (ANCEF) IVPB 2 g/50 mL premix  Status:  Discontinued     2 g 100 mL/hr over 30 Minutes Intravenous On call to O.R. 05/27/15 1044 05/27/15 1115   05/27/15 1044  vancomycin (VANCOCIN) IVPB 1000 mg/200 mL premix  Status:  Discontinued     1,000 mg 200 mL/hr over 60 Minutes Intravenous On call to O.R. 05/27/15 1044 05/27/15 1147     and started on DVT prophylaxis in the form of Xarelto.   PT and OT were ordered for total joint protocol.  Discharge planning consulted to help with postop disposition and equipment needs.  Patient had a good night on the evening of surgery.  They started to get up OOB with therapy on day one. Hemovac drain was pulled without difficulty.  Continued to work with therapy into day two.  Dressing was changed on day two and the incision was clean and dry.  Incision was healing well.  Patient was seen in rounds and was ready to go home.   Diet: Diabetic  diet Activity:WBAT Follow-up:in 2 weeks Disposition - Home Discharged Condition: stable   Discharge Instructions    Call MD / Call 911    Complete by:  As directed   If you experience chest pain or shortness of breath, CALL 911 and be transported to the hospital emergency room.  If you develope a fever above 101 F, pus (white drainage) or increased drainage or redness at the wound, or calf pain, call your surgeon's office.     Constipation Prevention    Complete by:  As directed   Drink plenty of fluids.  Prune juice may be helpful.  You may use a stool softener, such as Colace (over the counter) 100 mg twice a day.  Use MiraLax (over the counter) for constipation as needed.  Diet Carb Modified    Complete by:  As directed      Increase activity slowly as tolerated    Complete by:  As directed             Medication List    STOP taking these medications        aspirin 81 MG tablet     calcium-vitamin D 250-125 MG-UNIT per tablet  Commonly known as:  OSCAL WITH D     celecoxib 200 MG capsule  Commonly known as:  CELEBREX     multivitamin tablet     polycarbophil 625 MG tablet  Commonly known as:  FIBERCON     PROBIOTIC FORMULA PO     vitamin C 500 MG tablet  Commonly known as:  ASCORBIC ACID      TAKE these medications        fenofibrate 145 MG tablet  Commonly known as:  TRICOR  Take 1 tablet (145 mg total) by mouth every evening.     freestyle lancets  1 each by Other route 2 (two) times daily. Use as instructed     Insulin Pen Needle 31G X 8 MM Misc  Commonly known as:  CAREFINE PEN NEEDLES  Use once daily with Victoza injections.     Liraglutide 18 MG/3ML Sopn  Inject 0.3 mLs (1.8 mg total) into the skin daily.     lisinopril 10 MG tablet  Commonly known as:  PRINIVIL,ZESTRIL  Take 1 tablet (10 mg total) by mouth every evening.     metFORMIN 500 MG 24 hr tablet  Commonly known as:  GLUCOPHAGE-XR  Take 1 tablet (500 mg total) by mouth 2 (two)  times daily with a meal.     methocarbamol 500 MG tablet  Commonly known as:  ROBAXIN  Take 1 tablet (500 mg total) by mouth every 6 (six) hours as needed for muscle spasms.     omeprazole 20 MG capsule  Commonly known as:  PRILOSEC  Take 20 mg by mouth daily.     oxyCODONE 5 MG immediate release tablet  Commonly known as:  Oxy IR/ROXICODONE  Take 1-2 tablets (5-10 mg total) by mouth every 3 (three) hours as needed for breakthrough pain.     rivaroxaban 10 MG Tabs tablet  Commonly known as:  XARELTO  Take 1 tablet (10 mg total) by mouth daily with breakfast.     simvastatin 10 MG tablet  Commonly known as:  ZOCOR  Take 1 tablet (10 mg total) by mouth at bedtime.     tamsulosin 0.4 MG Caps capsule  Commonly known as:  FLOMAX  Take 0.4 mg by mouth every evening.     traMADol 50 MG tablet  Commonly known as:  ULTRAM  Take 1-2 tablets (50-100 mg total) by mouth every 6 (six) hours as needed for moderate pain.           Follow-up Information    Follow up with Gearlean Alf, MD. Schedule an appointment as soon as possible for a visit on 06/09/2015.   Specialty:  Orthopedic Surgery   Why:  Call [phone] Monday to make the appointment   Contact information:   [address] [place] [address] [phone]       Follow up with [facility].   Why:  home health physical therapy   Contact information:   [address] [place]  [address] [phone]       Signed: Ardeen Jourdain, PA-C  Orthopaedic Surgery 06/02/2015, 8:22 AM

## 2015-06-03 DIAGNOSIS — E119 Type 2 diabetes mellitus without complications: Secondary | ICD-10-CM | POA: Diagnosis not present

## 2015-06-03 DIAGNOSIS — M47817 Spondylosis without myelopathy or radiculopathy, lumbosacral region: Secondary | ICD-10-CM | POA: Diagnosis not present

## 2015-06-03 DIAGNOSIS — Z96651 Presence of right artificial knee joint: Secondary | ICD-10-CM | POA: Diagnosis not present

## 2015-06-03 DIAGNOSIS — Z471 Aftercare following joint replacement surgery: Secondary | ICD-10-CM | POA: Diagnosis not present

## 2015-06-03 DIAGNOSIS — I10 Essential (primary) hypertension: Secondary | ICD-10-CM | POA: Diagnosis not present

## 2015-06-03 DIAGNOSIS — E785 Hyperlipidemia, unspecified: Secondary | ICD-10-CM | POA: Diagnosis not present

## 2015-06-04 DIAGNOSIS — M47817 Spondylosis without myelopathy or radiculopathy, lumbosacral region: Secondary | ICD-10-CM | POA: Diagnosis not present

## 2015-06-04 DIAGNOSIS — E119 Type 2 diabetes mellitus without complications: Secondary | ICD-10-CM | POA: Diagnosis not present

## 2015-06-04 DIAGNOSIS — Z471 Aftercare following joint replacement surgery: Secondary | ICD-10-CM | POA: Diagnosis not present

## 2015-06-04 DIAGNOSIS — E785 Hyperlipidemia, unspecified: Secondary | ICD-10-CM | POA: Diagnosis not present

## 2015-06-04 DIAGNOSIS — I10 Essential (primary) hypertension: Secondary | ICD-10-CM | POA: Diagnosis not present

## 2015-06-04 DIAGNOSIS — Z96651 Presence of right artificial knee joint: Secondary | ICD-10-CM | POA: Diagnosis not present

## 2015-06-05 DIAGNOSIS — Z96651 Presence of right artificial knee joint: Secondary | ICD-10-CM | POA: Diagnosis not present

## 2015-06-05 DIAGNOSIS — M47817 Spondylosis without myelopathy or radiculopathy, lumbosacral region: Secondary | ICD-10-CM | POA: Diagnosis not present

## 2015-06-05 DIAGNOSIS — E119 Type 2 diabetes mellitus without complications: Secondary | ICD-10-CM | POA: Diagnosis not present

## 2015-06-05 DIAGNOSIS — Z471 Aftercare following joint replacement surgery: Secondary | ICD-10-CM | POA: Diagnosis not present

## 2015-06-05 DIAGNOSIS — E785 Hyperlipidemia, unspecified: Secondary | ICD-10-CM | POA: Diagnosis not present

## 2015-06-05 DIAGNOSIS — I10 Essential (primary) hypertension: Secondary | ICD-10-CM | POA: Diagnosis not present

## 2015-06-09 ENCOUNTER — Encounter: Payer: Self-pay | Admitting: Physical Therapy

## 2015-06-09 ENCOUNTER — Ambulatory Visit: Payer: Medicare Other | Attending: Orthopedic Surgery | Admitting: Physical Therapy

## 2015-06-09 DIAGNOSIS — M25661 Stiffness of right knee, not elsewhere classified: Secondary | ICD-10-CM | POA: Diagnosis not present

## 2015-06-09 DIAGNOSIS — R262 Difficulty in walking, not elsewhere classified: Secondary | ICD-10-CM | POA: Diagnosis not present

## 2015-06-09 DIAGNOSIS — Z96651 Presence of right artificial knee joint: Secondary | ICD-10-CM | POA: Diagnosis not present

## 2015-06-09 DIAGNOSIS — M25561 Pain in right knee: Secondary | ICD-10-CM | POA: Insufficient documentation

## 2015-06-09 DIAGNOSIS — R269 Unspecified abnormalities of gait and mobility: Secondary | ICD-10-CM | POA: Diagnosis not present

## 2015-06-09 DIAGNOSIS — Z471 Aftercare following joint replacement surgery: Secondary | ICD-10-CM | POA: Diagnosis not present

## 2015-06-09 NOTE — Therapy (Signed)
[facility] [address]  [place], [place], [address] Phone: [phone]   Fax:  [phone]  Physical Therapy Evaluation  Patient Details  Name: Matthew Nash MRN: [id number] Date of Birth: [birth year] Referring Provider:  Gaynelle Arabian, MD  Encounter Date: 06/09/2015      PT End of Session - 06/09/15 1117    Visit Number 1   Number of Visits 8   Date for PT Re-Evaluation 07/07/15   PT Start Time 2119   PT Stop Time 4174   PT Time Calculation (min) 55 min      Past Medical History  Diagnosis Date  . BPH (benign prostatic hyperplasia)     hx s/p turp  . Hyperlipidemia     takes Simvastatin and Tricor daily-under control  . Joint pain     both knees  . Joint swelling     rt knee  . Chronic back pain     spondylosis  . Melanoma     right hand;basal cell carcinoma  . H/O hiatal hernia   . History of colon polyps   . Diverticulitis   . Neuropathy   . Prostate infection     currently taking macrobid  . Diarrhea     x 1 today  . History of chicken pox   . Urinary incontinence   . UTI (lower urinary tract infection)   . Back pain at L4-L5 level   . Arthritis     both knees  . DDD (degenerative disc disease), lumbar   . History of kidney stones   . Diabetes mellitus     takes Metformin and Victoza daily-under control  . Hepatitis     B-with Mono and jaundice 1960  . GERD (gastroesophageal reflux disease)     takes Omeprazole daily-under control  . Hypertension     takes Lisinopril daily-under control  . H/O measles   . Pneumonia     hx of  . Tinnitus     Past Surgical History  Procedure Laterality Date  . Shoulder arthroscopy w/ rotator cuff repair Left NOV 2010  . Transurethral resection of prostate  2006  . Cortisone injection    . I&d left knee Left 1958    states 22 times  . C5 and t1 bone chip removed   1986  . Cholecystectomy  1993  . Right shoulder surgery  2012    rotator  cuff repair  . Colonoscopy  2011  . Lumbar laminectomy/decompression microdiscectomy Right 02/17/2014    Procedure: RIGHT LUMBAR TWO-THREE LAMINECTOMY;  Surgeon: Charlie Pitter, MD;  Location: [facility];  Service: Neurosurgery;  Laterality: Right;  right   . Knee arthroscopy Right 07/16/2014    Procedure: RIGHT ARTHROSCOPY KNEE WITH Medial and Lateral DEBRIDEMENT and chondroplasty;  Surgeon: Gearlean Alf, MD;  Location: [facility];  Service: Orthopedics;  Laterality: Right;  . Wisdom tooth extraction    . Basal cell carcinoma excision    . Vasectomy  1983  . Tonsillectomy  1942  . Cauterize inner nose  1957  . Total knee arthroplasty Right 05/27/2015    Procedure: RIGHT TOTAL KNEE ARTHROPLASTY;  Surgeon: Gaynelle Arabian, MD;  Location: [facility];  Service: Orthopedics;  Laterality: Right;    There were no vitals filed for this visit.  Visit Diagnosis:  Right knee pain  Difficulty walking  Abnormality of gait  Knee stiffness, right      Subjective Assessment - 06/09/15 1107  Subjective Pt underwent R TKA on 05/27/15 due to increasing pain and decreasing function.  Pt discharged from hospital on 05/29/15 and participated in [facility] 6/18-6/24/16.  Pt states he has been perfoming HEP.  He is using RW for all ambulation and states used [facility] prior to surgery due to poor balance.  Pt states he falls at least monthly and has been told he has peripheral neuropathy related to Diabetes.   Patient Stated Goals be able to go on fishing trip on 07/04/15   Currently in Pain? Yes   Pain Score 1   AVG pain 0-1/10 and worst pain up to 5/10 which is noted with stretching   Pain Location Knee   Pain Orientation Right   Pain Frequency Intermittent   Aggravating Factors  stretching, prolonged staning and walking   Pain Relieving Factors rest, ice            [facility] PT Assessment - 06/09/15 0001    Assessment   Medical Diagnosis s/p R TKA   Onset Date/Surgical Date 05/27/15   Balance Screen   Has the  patient fallen in the past 6 months Yes   How many times? 10   Has the patient had a decrease in activity level because of a fear of falling?  Yes   Is the patient reluctant to leave their home because of a fear of falling?  No   Home Environment   Living Environment Private residence   Living Arrangements Spouse/significant other   Type of [place] to enter   Entrance Stairs-Number of Steps 1   [place] One level   Prior [place] Retired   Leisure enjoys fishing, denies regular exercise   Observation/Other Assessments   Focus on Therapeutic Outcomes (FOTO)  64% limitation   ROM / Strength   AROM / PROM / Strength AROM;PROM;Strength   AROM   Overall AROM Comments R Knee AROM 13-95   PROM   Overall PROM Comments R Knee PROM 6-102   Strength   Overall Strength Comments R Knee 4/5 grossly in available AROM   Ambulation/Gait   Gait Comments pt ambulating with use of RW.  Gilford Rile is too low but is maxed out to top position (walker was a hand-me-down).  Gait is slow with short B stide, flexed posture. minimal foot clearance.           TODAY'S TREATMENT R Knee Flexion and Extension mobes grade 3 NuStep lvl 5, 5\' 6"  FW Step-up with RW Assist 10x B Knee Flexion Machine 20# 10x with R TKE at top of motion                      PT Long Term Goals - 06/09/15 1158    PT LONG TERM GOAL #1   Title R Knee AROM 5-120 or better by 07/07/15   Status New   PT LONG TERM GOAL #2   Title pt able to ambulate with SPC over level and uneven terrain with good mechanics and distances not limited by pain by 07/07/15   Status New               Plan - 06/09/15 1147    Clinical Impression Statement pt is 2 wks s/p R TKA.  He's doing quite well with regard to flexion ROM and pain control (states pain is typically 0-1/10 and only 5/10 at worst).  Current impairments include lack of TKE AROM (AROM 13-95  and PROM 6-102).  Gait is impaired which  is in part due to poor RW set-up.  He has a used walker that is too short for him.  In addition, pt with B LE peripheral neuropathy and has been a frequent faller due to poor balance prior to surgery so progression from RW to [facility] will be based on safety related to balance rather than strictly knee function.   Pt will benefit from skilled therapeutic intervention in order to improve on the following deficits Pain;Abnormal gait;Decreased range of motion;Difficulty walking;Improper body mechanics;Decreased balance;Decreased strength   Rehab Potential Good   PT Frequency 2x / week   PT Duration 4 weeks   PT Treatment/Interventions Therapeutic exercise;Therapeutic activities;Gait training;Balance training;Patient/family education;Vasopneumatic Device;Manual techniques;Scar mobilization;Stair training;Cryotherapy;Functional mobility training   PT Next Visit Plan knee ROM and strengthening (TKE focus); gait and balance training (work with [facility] in clinic as able)   Consulted and Agree with Plan of Care Patient          G-Codes - 06/26/15 1626    Functional Assessment Tool Used foto 64% limitation   Functional Limitation Mobility: Walking and moving around   Mobility: Walking and Moving Around Current Status (C1448) At least 60 percent but less than 80 percent impaired, limited or restricted   Mobility: Walking and Moving Around Goal Status (J8563) At least 40 percent but less than 60 percent impaired, limited or restricted       Problem List Patient Active Problem List   Diagnosis Date Noted  . OA (osteoarthritis) of knee 05/27/2015  . Diabetes mellitus type II, controlled 11/26/2014  . Gastroesophageal reflux disease without esophagitis 11/26/2014  . Arthritis of both knees 11/26/2014  . Absence of bladder continence 11/26/2014  . Hyperlipidemia LDL goal <100 11/26/2014  . Acute medial meniscal tear 07/16/2014  . Lumbosacral spondylosis without myelopathy 02/17/2014  . Spondylosis,  lumbosacral 02/17/2014    Janeen Watson PT, OCS June 26, 2015, 4:26 PM  [facility] [address]  [place], [place], [address] Phone: [phone]   Fax:  [phone]

## 2015-06-12 ENCOUNTER — Ambulatory Visit: Payer: Medicare Other | Attending: Orthopedic Surgery | Admitting: Rehabilitation

## 2015-06-12 DIAGNOSIS — M25661 Stiffness of right knee, not elsewhere classified: Secondary | ICD-10-CM | POA: Diagnosis not present

## 2015-06-12 DIAGNOSIS — M25561 Pain in right knee: Secondary | ICD-10-CM | POA: Insufficient documentation

## 2015-06-12 DIAGNOSIS — R262 Difficulty in walking, not elsewhere classified: Secondary | ICD-10-CM | POA: Diagnosis not present

## 2015-06-12 DIAGNOSIS — R269 Unspecified abnormalities of gait and mobility: Secondary | ICD-10-CM

## 2015-06-12 NOTE — Therapy (Signed)
[facility] [address]  [place], [place], [address] Phone: [phone]   Fax:  [phone]  Physical Therapy Treatment  Patient Details  Name: Matthew Nash MRN: [id number] Date of Birth: 1936-06-10 Referring Provider:  Gaynelle Arabian, MD  Encounter Date: 06/12/2015      PT End of Session - 06/12/15 1052    Visit Number 2   Number of Visits 8   Date for PT Re-Evaluation 07/07/15   PT Start Time 1100   PT Stop Time 1154   PT Time Calculation (min) 54 min   Activity Tolerance Patient tolerated treatment well   Behavior During Therapy [facility] for tasks assessed/performed      Past Medical History  Diagnosis Date  . BPH (benign prostatic hyperplasia)     hx s/p turp  . Hyperlipidemia     takes Simvastatin and Tricor daily-under control  . Joint pain     both knees  . Joint swelling     rt knee  . Chronic back pain     spondylosis  . Melanoma     right hand;basal cell carcinoma  . H/O hiatal hernia   . History of colon polyps   . Diverticulitis   . Neuropathy   . Prostate infection     currently taking macrobid  . Diarrhea     x 1 today  . History of chicken pox   . Urinary incontinence   . UTI (lower urinary tract infection)   . Back pain at L4-L5 level   . Arthritis     both knees  . DDD (degenerative disc disease), lumbar   . History of kidney stones   . Diabetes mellitus     takes Metformin and Victoza daily-under control  . Hepatitis     B-with Mono and jaundice 1960  . GERD (gastroesophageal reflux disease)     takes Omeprazole daily-under control  . Hypertension     takes Lisinopril daily-under control  . H/O measles   . Pneumonia     hx of  . Tinnitus     Past Surgical History  Procedure Laterality Date  . Shoulder arthroscopy w/ rotator cuff repair Left NOV 2010  . Transurethral resection of prostate  2006  . Cortisone injection    . I&d left knee Left 1958    states 22 times   . C5 and t1 bone chip removed   1986  . Cholecystectomy  1993  . Right shoulder surgery  2012    rotator cuff repair  . Colonoscopy  2011  . Lumbar laminectomy/decompression microdiscectomy Right 02/17/2014    Procedure: RIGHT LUMBAR TWO-THREE LAMINECTOMY;  Surgeon: Charlie Pitter, MD;  Location: [place] [facility];  Service: Neurosurgery;  Laterality: Right;  right   . Knee arthroscopy Right 07/16/2014    Procedure: RIGHT ARTHROSCOPY KNEE WITH Medial and Lateral DEBRIDEMENT and chondroplasty;  Surgeon: Gearlean Alf, MD;  [facility]: [facility];  Service: Orthopedics;  Laterality: Right;  . Wisdom tooth extraction    . Basal cell carcinoma excision    . Vasectomy  1983  . Tonsillectomy  1942  . Cauterize inner nose  1957  . Total knee arthroplasty Right 05/27/2015    Procedure: RIGHT TOTAL KNEE ARTHROPLASTY;  Surgeon: Gaynelle Arabian, MD;  [facility];  Service: Orthopedics;  Laterality: Right;    There were no vitals filed for this visit.  Visit Diagnosis:  Right knee pain  Difficulty walking  Abnormality of gait  Knee stiffness, right      Subjective Assessment - 06/12/15 1103    Subjective States he walked too much yesterday and is stiff today. Just some discomfort. States he has been doing his bike at home but can only get 3/4 of the way around. Reports compliance with HEP.    Currently in Pain? No/denies     TODAY'S TREATMENT TherEx- Nustep level 5x5' LE only FOB (peanut) Tuck 15x3" FOB (peanut) QS+Hip Ext Iso 10x5" SAQ 10x5" Hamstring Stretch 3x20" PROM into Flexion and Extension Standing TKE with ball on wall 10x5" Wall sits with pball (55cm) 10x (verbal cues for weight shift) 6" Fwd Step-up with RW Assist 10x B Knee Flexion Machine 20# 10x with R TKE at top of motion  Vaso: Medium, 40 degrees, Rt Knee x15'       PT Long Term Goals - 06/12/15 1054    PT LONG TERM GOAL #1   Title R Knee AROM 5-120 or better by 07/07/15   Status On-going   PT LONG TERM GOAL #2    Title pt able to ambulate with SPC over level and uneven terrain with good mechanics and distances not limited by pain by 07/07/15   Status On-going               Plan - 06/12/15 1145    Clinical Impression Statement Good tolerance to all exercises with no complaint of knee pain. Pt did note some Rt hip/LBP after the standing TKE exercise. States his hip has bothered him since surgery. Discussed the use of his [facility] for home use which he demonstrates proper step through pattern with cane in proper hand.    PT Next Visit Plan knee ROM and strengthening (TKE focus); gait and balance training (work with [facility] in clinic as able)   Consulted and Agree with Plan of Care Patient        Problem List Patient Active Problem List   Diagnosis Date Noted  . OA (osteoarthritis) of knee 05/27/2015  . Diabetes mellitus type II, controlled 11/26/2014  . Gastroesophageal reflux disease without esophagitis 11/26/2014  . Arthritis of both knees 11/26/2014  . Absence of bladder continence 11/26/2014  . Hyperlipidemia LDL goal <100 11/26/2014  . Acute medial meniscal tear 07/16/2014  . Lumbosacral spondylosis without myelopathy 02/17/2014  . Spondylosis, lumbosacral 02/17/2014    Barbette Hair, PTA 06/12/2015, 11:48 AM  [facility] [address]  [place], [place], [address] Phone: [phone]   Fax:  [phone]

## 2015-06-13 ENCOUNTER — Encounter (HOSPITAL_BASED_OUTPATIENT_CLINIC_OR_DEPARTMENT_OTHER): Payer: Self-pay | Admitting: *Deleted

## 2015-06-13 ENCOUNTER — Emergency Department (HOSPITAL_BASED_OUTPATIENT_CLINIC_OR_DEPARTMENT_OTHER)
Admission: EM | Admit: 2015-06-13 | Discharge: 2015-06-13 | Disposition: A | Discharge status: Home / Self Care | Payer: Medicare Other | Attending: Emergency Medicine | Admitting: Emergency Medicine

## 2015-06-13 DIAGNOSIS — Z87442 Personal history of urinary calculi: Secondary | ICD-10-CM | POA: Diagnosis not present

## 2015-06-13 DIAGNOSIS — N400 Enlarged prostate without lower urinary tract symptoms: Secondary | ICD-10-CM | POA: Diagnosis not present

## 2015-06-13 DIAGNOSIS — Z880 Allergy status to penicillin: Secondary | ICD-10-CM | POA: Diagnosis not present

## 2015-06-13 DIAGNOSIS — Z8701 Personal history of pneumonia (recurrent): Secondary | ICD-10-CM | POA: Diagnosis not present

## 2015-06-13 DIAGNOSIS — K219 Gastro-esophageal reflux disease without esophagitis: Secondary | ICD-10-CM | POA: Insufficient documentation

## 2015-06-13 DIAGNOSIS — Z87891 Personal history of nicotine dependence: Secondary | ICD-10-CM | POA: Insufficient documentation

## 2015-06-13 DIAGNOSIS — Z86010 Personal history of colonic polyps: Secondary | ICD-10-CM | POA: Insufficient documentation

## 2015-06-13 DIAGNOSIS — Z85820 Personal history of malignant melanoma of skin: Secondary | ICD-10-CM | POA: Insufficient documentation

## 2015-06-13 DIAGNOSIS — G8929 Other chronic pain: Secondary | ICD-10-CM | POA: Diagnosis not present

## 2015-06-13 DIAGNOSIS — I10 Essential (primary) hypertension: Secondary | ICD-10-CM | POA: Insufficient documentation

## 2015-06-13 DIAGNOSIS — Z794 Long term (current) use of insulin: Secondary | ICD-10-CM | POA: Diagnosis not present

## 2015-06-13 DIAGNOSIS — Z8619 Personal history of other infectious and parasitic diseases: Secondary | ICD-10-CM | POA: Diagnosis not present

## 2015-06-13 DIAGNOSIS — N390 Urinary tract infection, site not specified: Secondary | ICD-10-CM | POA: Insufficient documentation

## 2015-06-13 DIAGNOSIS — E785 Hyperlipidemia, unspecified: Secondary | ICD-10-CM | POA: Insufficient documentation

## 2015-06-13 DIAGNOSIS — R350 Frequency of micturition: Secondary | ICD-10-CM | POA: Diagnosis present

## 2015-06-13 DIAGNOSIS — M1990 Unspecified osteoarthritis, unspecified site: Secondary | ICD-10-CM | POA: Insufficient documentation

## 2015-06-13 DIAGNOSIS — Z79899 Other long term (current) drug therapy: Secondary | ICD-10-CM | POA: Diagnosis not present

## 2015-06-13 LAB — URINALYSIS, ROUTINE W REFLEX MICROSCOPIC
BILIRUBIN URINE: NEGATIVE
Glucose, UA: NEGATIVE mg/dL
Ketones, ur: NEGATIVE mg/dL
Nitrite: NEGATIVE
Protein, ur: 30 mg/dL — AB
SPECIFIC GRAVITY, URINE: 1.024 (ref 1.005–1.030)
Urobilinogen, UA: 1 mg/dL (ref 0.0–1.0)
pH: 6 (ref 5.0–8.0)

## 2015-06-13 LAB — URINE MICROSCOPIC-ADD ON

## 2015-06-13 MED ORDER — CEPHALEXIN 500 MG PO CAPS: 500.0000 mg | ORAL_CAPSULE | Freq: Four times a day (QID) | ORAL | Status: DC

## 2015-06-13 NOTE — Discharge Instructions (Signed)

## 2015-06-13 NOTE — ED Notes (Signed)
MD at bedside. 

## 2015-06-13 NOTE — ED Notes (Signed)
Pt reports that yesterday afternoon he began to loose control of his bladder.  Pt had dysuria.

## 2015-06-13 NOTE — ED Provider Notes (Signed)
CSN: [id number]     Arrival date & time 06/13/15  1546 History   First MD Initiated Contact with Patient 06/13/15 1559     Chief Complaint  Patient presents with  . Urinary Tract Infection     (Consider location/radiation/quality/duration/timing/severity/associated sxs/prior Treatment) Patient is a 79 y.o. male presenting with frequency. The history is provided by the patient. No language interpreter was used.  Urinary Frequency This is a new problem. The current episode started today. The problem occurs constantly. The problem has been unchanged. Nothing aggravates the symptoms. He has tried nothing for the symptoms. The treatment provided moderate relief.    Past Medical History  Diagnosis Date  . BPH (benign prostatic hyperplasia)     hx s/p turp  . Hyperlipidemia     takes Simvastatin and Tricor daily-under control  . Joint pain     both knees  . Joint swelling     rt knee  . Chronic back pain     spondylosis  . Melanoma     right hand;basal cell carcinoma  . H/O hiatal hernia   . History of colon polyps   . Diverticulitis   . Neuropathy   . Prostate infection     currently taking macrobid  . Diarrhea     x 1 today  . History of chicken pox   . Urinary incontinence   . UTI (lower urinary tract infection)   . Back pain at L4-L5 level   . Arthritis     both knees  . DDD (degenerative disc disease), lumbar   . History of kidney stones   . Diabetes mellitus     takes Metformin and Victoza daily-under control  . Hepatitis     B-with Mono and jaundice 1960  . GERD (gastroesophageal reflux disease)     takes Omeprazole daily-under control  . Hypertension     takes Lisinopril daily-under control  . H/O measles   . Pneumonia     hx of  . Tinnitus    Past Surgical History  Procedure Laterality Date  . Shoulder arthroscopy w/ rotator cuff repair Left NOV 2010  . Transurethral resection of prostate  2006  . Cortisone injection    . I&d left knee Left 1958     states 22 times  . C5 and t1 bone chip removed   1986  . Cholecystectomy  1993  . Right shoulder surgery  2012    rotator cuff repair  . Colonoscopy  2011  . Lumbar laminectomy/decompression microdiscectomy Right 02/17/2014    Procedure: RIGHT LUMBAR TWO-THREE LAMINECTOMY;  Surgeon: Charlie Pitter, MD;  Location: [place] [facility];  Service: Neurosurgery;  Laterality: Right;  right   . Knee arthroscopy Right 07/16/2014    Procedure: RIGHT ARTHROSCOPY KNEE WITH Medial and Lateral DEBRIDEMENT and chondroplasty;  Surgeon: Gearlean Alf, MD;  [facility];  Service: Orthopedics;  Laterality: Right;  . Wisdom tooth extraction    . Basal cell carcinoma excision    . Vasectomy  1983  . Tonsillectomy  1942  . Cauterize inner nose  1957  . Total knee arthroplasty Right 05/27/2015    Procedure: RIGHT TOTAL KNEE ARTHROPLASTY;  Surgeon: Gaynelle Arabian, MD;  [facility];  Service: Orthopedics;  Laterality: Right;   Family History  Problem Relation Age of Onset  . Heart disease Mother 50    Deceased  . Bladder Cancer Mother   . Prostate cancer Father     Deceased-in 35s  . Healthy  Brother   . Skin cancer Brother     #2  . Asthma Sister     Deceased  . Healthy Son     #1  . Healthy Daughter     #2  . Obesity Son     #2   History  Substance Use Topics  . Smoking status: Former Smoker    Types: Pipe    Quit date: 07/14/1978  . Smokeless tobacco: Never Used     Comment: quit smoking 1979  . Alcohol Use: Yes     Comment: OCCASIONAL    Review of Systems  Genitourinary: Positive for frequency.  All other systems reviewed and are negative.     Allergies  Albamycin; Shrimp; and Penicillins  Home Medications   Prior to Admission medications   Medication Sig Start Date End Date Taking? Authorizing Provider  fenofibrate (TRICOR) 145 MG tablet Take 1 tablet (145 mg total) by mouth every evening. 05/26/15   Brunetta Jeans, PA-C  Insulin Pen Needle (CAREFINE PEN NEEDLES) 31G X 8 MM  MISC Use once daily with Victoza injections. 05/12/15   Brunetta Jeans, PA-C  Lancets (FREESTYLE) lancets 1 each by Other route 2 (two) times daily. Use as instructed 11/25/14   Brunetta Jeans, PA-C  Liraglutide 18 MG/3ML SOPN Inject 0.3 mLs (1.8 mg total) into the skin daily. 03/23/15   Brunetta Jeans, PA-C  lisinopril (PRINIVIL,ZESTRIL) 10 MG tablet Take 1 tablet (10 mg total) by mouth every evening. 05/14/15   Brunetta Jeans, PA-C  metFORMIN (GLUCOPHAGE-XR) 500 MG 24 hr tablet Take 1 tablet (500 mg total) by mouth 2 (two) times daily with a meal. 05/26/15   Brunetta Jeans, PA-C  methocarbamol (ROBAXIN) 500 MG tablet Take 1 tablet (500 mg total) by mouth every 6 (six) hours as needed for muscle spasms. 05/28/15   Amber Cecilio Asper, PA-C  omeprazole (PRILOSEC) 20 MG capsule Take 20 mg by mouth daily.     Historical Provider, MD  oxyCODONE (OXY IR/ROXICODONE) 5 MG immediate release tablet Take 1-2 tablets (5-10 mg total) by mouth every 3 (three) hours as needed for breakthrough pain. Patient not taking: Reported on 06/09/2015 05/28/15   Ardeen Jourdain, PA-C  rivaroxaban (XARELTO) 10 MG TABS tablet Take 1 tablet (10 mg total) by mouth daily with breakfast. 05/28/15   Ardeen Jourdain, PA-C  simvastatin (ZOCOR) 10 MG tablet Take 1 tablet (10 mg total) by mouth at bedtime. 02/10/15   Brunetta Jeans, PA-C  tamsulosin (FLOMAX) 0.4 MG CAPS capsule Take 0.4 mg by mouth every evening.     Historical Provider, MD  traMADol (ULTRAM) 50 MG tablet Take 1-2 tablets (50-100 mg total) by mouth every 6 (six) hours as needed for moderate pain. Patient not taking: Reported on 06/09/2015 05/28/15   Amber Constable, PA-C   BP 133/63 mmHg  Pulse 95  Temp(Src) 97.7 F (36.5 C) (Oral)  Resp 18  Ht 5\' 11"  (1.803 m)  Wt 220 lb (99.791 kg)  BMI 30.70 kg/m2  SpO2 98% Physical Exam  Constitutional: He is oriented to person, place, and time. He appears well-developed and well-nourished.  HENT:  Head: Normocephalic.   Right Ear: External ear normal.  Left Ear: External ear normal.  Nose: Nose normal.  Mouth/Throat: Oropharynx is clear and moist.  Eyes: Conjunctivae and EOM are normal. Pupils are equal, round, and reactive to light.  Neck: Normal range of motion.  Cardiovascular: Normal rate.   Pulmonary/Chest: Effort normal.  Abdominal: Soft. He exhibits  no distension.  Musculoskeletal: Normal range of motion.  Neurological: He is alert and oriented to person, place, and time.  Psychiatric: He has a normal mood and affect.  Nursing note and vitals reviewed.   ED Course  Procedures (including critical care time) Labs Review Labs Reviewed  URINALYSIS, ROUTINE W REFLEX MICROSCOPIC (NOT AT [facility]) - Abnormal; Notable for the following:    APPearance CLOUDY (*)    Hgb urine dipstick SMALL (*)    Protein, ur 30 (*)    Leukocytes, UA LARGE (*)    All other components within normal limits  URINE MICROSCOPIC-ADD ON - Abnormal; Notable for the following:    Bacteria, UA MANY (*)    Casts HYALINE CASTS (*)    All other components within normal limits    Imaging Review No results found.   EKG Interpretation None      MDM   Final diagnoses:  UTI (lower urinary tract infection)    Keflex See your Physicain for recheck in 3-4 days Urine culture pending    Fransico Meadow, PA-C 06/13/15 [place], MD 06/13/15 2336

## 2015-06-13 NOTE — ED Notes (Signed)
Pt c/o urinary frequency and urinary incontience, states also having burning with urination as well

## 2015-06-15 ENCOUNTER — Encounter: Payer: Self-pay | Admitting: Physician Assistant

## 2015-06-15 MED ORDER — OMEPRAZOLE 20 MG PO CPDR: 20.0000 mg | DELAYED_RELEASE_CAPSULE | Freq: Every day | ORAL | Status: DC

## 2015-06-16 ENCOUNTER — Ambulatory Visit: Payer: Medicare Other | Admitting: Rehabilitation

## 2015-06-16 DIAGNOSIS — M25661 Stiffness of right knee, not elsewhere classified: Secondary | ICD-10-CM

## 2015-06-16 DIAGNOSIS — M25561 Pain in right knee: Secondary | ICD-10-CM | POA: Diagnosis not present

## 2015-06-16 DIAGNOSIS — R262 Difficulty in walking, not elsewhere classified: Secondary | ICD-10-CM | POA: Diagnosis not present

## 2015-06-16 DIAGNOSIS — R269 Unspecified abnormalities of gait and mobility: Secondary | ICD-10-CM | POA: Diagnosis not present

## 2015-06-16 LAB — URINE CULTURE: Culture: >=100000

## 2015-06-16 NOTE — Therapy (Signed)
[facility] [address]  [place], [place], [address] Phone: [phone]   Fax:  [phone]  Physical Therapy Treatment  Patient Details  Name: Matthew Nash MRN: [id number] Date of Birth: 1936-04-26 Referring Provider:  Gaynelle Arabian, MD  Encounter Date: 06/16/2015      PT End of Session - 06/16/15 1538    Visit Number 3   Number of Visits 8   Date for PT Re-Evaluation 07/07/15   PT Start Time 6962   PT Stop Time 1613   PT Time Calculation (min) 40 min      Past Medical History  Diagnosis Date  . BPH (benign prostatic hyperplasia)     hx s/p turp  . Hyperlipidemia     takes Simvastatin and Tricor daily-under control  . Joint pain     both knees  . Joint swelling     rt knee  . Chronic back pain     spondylosis  . Melanoma     right hand;basal cell carcinoma  . H/O hiatal hernia   . History of colon polyps   . Diverticulitis   . Neuropathy   . Prostate infection     currently taking macrobid  . Diarrhea     x 1 today  . History of chicken pox   . Urinary incontinence   . UTI (lower urinary tract infection)   . Back pain at L4-L5 level   . Arthritis     both knees  . DDD (degenerative disc disease), lumbar   . History of kidney stones   . Diabetes mellitus     takes Metformin and Victoza daily-under control  . Hepatitis     B-with Mono and jaundice 1960  . GERD (gastroesophageal reflux disease)     takes Omeprazole daily-under control  . Hypertension     takes Lisinopril daily-under control  . H/O measles   . Pneumonia     hx of  . Tinnitus     Past Surgical History  Procedure Laterality Date  . Shoulder arthroscopy w/ rotator cuff repair Left NOV 2010  . Transurethral resection of prostate  2006  . Cortisone injection    . I&d left knee Left 1958    states 22 times  . C5 and t1 bone chip removed   1986  . Cholecystectomy  1993  . Right shoulder surgery  2012    rotator cuff  repair  . Colonoscopy  2011  . Lumbar laminectomy/decompression microdiscectomy Right 02/17/2014    Procedure: RIGHT LUMBAR TWO-THREE LAMINECTOMY;  Surgeon: Charlie Pitter, MD;  Location: [place] [facility];  Service: Neurosurgery;  Laterality: Right;  right   . Knee arthroscopy Right 07/16/2014    Procedure: RIGHT ARTHROSCOPY KNEE WITH Medial and Lateral DEBRIDEMENT and chondroplasty;  Surgeon: Gearlean Alf, MD;  [facility];  Service: Orthopedics;  Laterality: Right;  . Wisdom tooth extraction    . Basal cell carcinoma excision    . Vasectomy  1983  . Tonsillectomy  1942  . Cauterize inner nose  1957  . Total knee arthroplasty Right 05/27/2015    Procedure: RIGHT TOTAL KNEE ARTHROPLASTY;  Surgeon: Gaynelle Arabian, MD;  [facility];  Service: Orthopedics;  Laterality: Right;    There were no vitals filed for this visit.  Visit Diagnosis:  Right knee pain  Knee stiffness, right  Difficulty walking  Abnormality of gait      Subjective Assessment - 06/16/15 1536  Subjective Reports he had to go to ER over the weekend for an infection (not his knee but elsewhere). Was able to get all the way around on his bike at home. But wasn't able to do too much due to not feeling well.    Currently in Pain? No/denies      TODAY'S TREATMENT TherEx- Rec Bike full revolutions x5' SAQ 2# 15x3"  ROM Measurement  Manual- PROM into Flexion and extension  Knee Extension mobs Grade 2-3, Tibial AP/PA's at 90 degrees flexion.    TherEx- Hamstring Stretch 3x20" Bridges x2 (pain in back) QS+SLR x10 QS into [employer] 10x5" 6" Fwd Step-ups with RW Assist 12x (medial knee pain) Seated Hip Adduction with ball squeeze 10x5" LAQ with ball Squeeze 10x3" Knee Flexion 25# x15; then B concentric, Rt eccentric 10x  AROM: 12-110        PT Long Term Goals - 06/12/15 1054    PT LONG TERM GOAL #1   Title R Knee AROM 5-120 or better by 07/07/15   Status On-going   PT LONG TERM GOAL #2   Title pt  able to ambulate with SPC over level and uneven terrain with good mechanics and distances not limited by pain by 07/07/15   Status On-going               Plan - 06/16/15 1615    Clinical Impression Statement Pt using SPC walking into clinic today with good pattern, still lacking TKE on Rt. Pt reported dizziness with some standing exercises but also stated that this has been happening all weekend since taking antibiotics.     PT Next Visit Plan knee ROM and strengthening (TKE focus); gait and balance training (work with [facility] in clinic as able)   Consulted and Agree with Plan of Care Patient        Problem List Patient Active Problem List   Diagnosis Date Noted  . OA (osteoarthritis) of knee 05/27/2015  . Diabetes mellitus type II, controlled 11/26/2014  . Gastroesophageal reflux disease without esophagitis 11/26/2014  . Arthritis of both knees 11/26/2014  . Absence of bladder continence 11/26/2014  . Hyperlipidemia LDL goal <100 11/26/2014  . Acute medial meniscal tear 07/16/2014  . Lumbosacral spondylosis without myelopathy 02/17/2014  . Spondylosis, lumbosacral 02/17/2014    Barbette Hair, PTA 06/16/2015, 4:17 PM  [facility] [address]  [place], [place], [address] Phone: [phone]   Fax:  [phone]

## 2015-06-17 ENCOUNTER — Telehealth (HOSPITAL_BASED_OUTPATIENT_CLINIC_OR_DEPARTMENT_OTHER): Payer: Self-pay | Admitting: Emergency Medicine

## 2015-06-17 NOTE — Telephone Encounter (Signed)
Post ED Visit - Positive Culture Follow-up  Culture report reviewed by antimicrobial stewardship pharmacist: []  Wes Dulaney, Pharm.D., BCPS []  Heide Guile, Pharm.D., BCPS []  Alycia Rossetti, Pharm.D., BCPS []  [place], [place].D., BCPS, AAHIVP []  Legrand Como, Pharm.D., BCPS, AAHIVP []  Isac Sarna, Pharm.D., BCPS [place] PharmD  Positive urine culture Proteus  Treated with cephalexin, organism sensitive to the same and no further patient follow-up is required at this time.  Hazle Nordmann 06/17/2015, 1:02 PM

## 2015-06-18 ENCOUNTER — Ambulatory Visit: Payer: Medicare Other | Admitting: Rehabilitation

## 2015-06-18 DIAGNOSIS — R262 Difficulty in walking, not elsewhere classified: Secondary | ICD-10-CM | POA: Diagnosis not present

## 2015-06-18 DIAGNOSIS — M25561 Pain in right knee: Secondary | ICD-10-CM | POA: Diagnosis not present

## 2015-06-18 DIAGNOSIS — R269 Unspecified abnormalities of gait and mobility: Secondary | ICD-10-CM

## 2015-06-18 DIAGNOSIS — M25661 Stiffness of right knee, not elsewhere classified: Secondary | ICD-10-CM | POA: Diagnosis not present

## 2015-06-18 NOTE — Therapy (Signed)
[facility] [address]  [place], [place], [address] Phone: [phone]   Fax:  [phone]  Physical Therapy Treatment  Patient Details  Name: Matthew Nash MRN: [id number] Date of Birth: Jul 28, 1936 Referring Provider:  Gaynelle Arabian, MD  Encounter Date: 06/18/2015      PT End of Session - 06/18/15 1314    Visit Number 4   Number of Visits 8   Date for PT Re-Evaluation 07/07/15   PT Start Time 7867   PT Stop Time 6720   PT Time Calculation (min) 43 min      Past Medical History  Diagnosis Date  . BPH (benign prostatic hyperplasia)     hx s/p turp  . Hyperlipidemia     takes Simvastatin and Tricor daily-under control  . Joint pain     both knees  . Joint swelling     rt knee  . Chronic back pain     spondylosis  . Melanoma     right hand;basal cell carcinoma  . H/O hiatal hernia   . History of colon polyps   . Diverticulitis   . Neuropathy   . Prostate infection     currently taking macrobid  . Diarrhea     x 1 today  . History of chicken pox   . Urinary incontinence   . UTI (lower urinary tract infection)   . Back pain at L4-L5 level   . Arthritis     both knees  . DDD (degenerative disc disease), lumbar   . History of kidney stones   . Diabetes mellitus     takes Metformin and Victoza daily-under control  . Hepatitis     B-with Mono and jaundice 1960  . GERD (gastroesophageal reflux disease)     takes Omeprazole daily-under control  . Hypertension     takes Lisinopril daily-under control  . H/O measles   . Pneumonia     hx of  . Tinnitus     Past Surgical History  Procedure Laterality Date  . Shoulder arthroscopy w/ rotator cuff repair Left NOV 2010  . Transurethral resection of prostate  2006  . Cortisone injection    . I&d left knee Left 1958    states 22 times  . C5 and t1 bone chip removed   1986  . Cholecystectomy  1993  . Right shoulder surgery  2012    rotator cuff  repair  . Colonoscopy  2011  . Lumbar laminectomy/decompression microdiscectomy Right 02/17/2014    Procedure: RIGHT LUMBAR TWO-THREE LAMINECTOMY;  Surgeon: Charlie Pitter, MD;  Location: [place] [facility];  Service: Neurosurgery;  Laterality: Right;  right   . Knee arthroscopy Right 07/16/2014    Procedure: RIGHT ARTHROSCOPY KNEE WITH Medial and Lateral DEBRIDEMENT and chondroplasty;  Surgeon: Gearlean Alf, MD;  [facility]: [facility];  Service: Orthopedics;  Laterality: Right;  . Wisdom tooth extraction    . Basal cell carcinoma excision    . Vasectomy  1983  . Tonsillectomy  1942  . Cauterize inner nose  1957  . Total knee arthroplasty Right 05/27/2015    Procedure: RIGHT TOTAL KNEE ARTHROPLASTY;  Surgeon: Gaynelle Arabian, MD;  [facility];  Service: Orthopedics;  Laterality: Right;    There were no vitals filed for this visit.  Visit Diagnosis:  Knee stiffness, right  Right knee pain  Difficulty walking  Abnormality of gait      Subjective Assessment - 06/18/15 1318  Subjective "Not sure what happened but it tightened up last night." Reports he couldn't get all the way around on his bike today either but has been walking around more. Denies pain but states he feels a small twing of pain every now and then on lateral /medial side running down his shin.    Currently in Pain? No/denies      TODAY'S TREATMENT TherEx- Rec Bike full revolutions x5'  Manual- PROM into Flexion and extension  Knee Extension mobs Grade 2-3, Tibial AP/PA's at 90 degrees flexion.   TherEx- Hamstring Stretch 3x20" Bridges x10  QS into [employer] 15x5" SAQ 3# 10x3" FOB (55cm) Tuck x10 with gentle overpressure Fitter (1 black 1 blue) Seated Leg Press 15x3" Wall Squats with pball x15 Standing Hip Abduction x10 each side (2 pole assist) Slow March on Blue Foam x10 (2 pole assist)      PT Long Term Goals - 06/12/15 1054    PT LONG TERM GOAL #1   Title R Knee AROM 5-120 or better by 07/07/15    Status On-going   PT LONG TERM GOAL #2   Title pt able to ambulate with SPC over level and uneven terrain with good mechanics and distances not limited by pain by 07/07/15   Status On-going               Plan - 06/18/15 1404    Clinical Impression Statement No LOB during balance exercises today and improved weightshift during wall squats without verbal cues. Continued dizziness reported per pt with standing exercises but he contributes that to his antibiotics.    PT Next Visit Plan knee ROM and strengthening (TKE focus); gait and balance training (work with [facility] in clinic as able)   Consulted and Agree with Plan of Care Patient        Problem List Patient Active Problem List   Diagnosis Date Noted  . OA (osteoarthritis) of knee 05/27/2015  . Diabetes mellitus type II, controlled 11/26/2014  . Gastroesophageal reflux disease without esophagitis 11/26/2014  . Arthritis of both knees 11/26/2014  . Absence of bladder continence 11/26/2014  . Hyperlipidemia LDL goal <100 11/26/2014  . Acute medial meniscal tear 07/16/2014  . Lumbosacral spondylosis without myelopathy 02/17/2014  . Spondylosis, lumbosacral 02/17/2014    Barbette Hair, PTA 06/18/2015, 2:10 PM  [facility] [address]  [place], [place], [address] Phone: [phone]   Fax:  [phone]

## 2015-06-19 ENCOUNTER — Encounter: Payer: Self-pay | Admitting: Physician Assistant

## 2015-06-19 ENCOUNTER — Ambulatory Visit (INDEPENDENT_AMBULATORY_CARE_PROVIDER_SITE_OTHER): Payer: Medicare Other | Admitting: Physician Assistant

## 2015-06-19 VITALS — BP 119/47 | HR 88 | Temp 97.9°F | Ht 70.0 in | Wt 222.4 lb

## 2015-06-19 DIAGNOSIS — N390 Urinary tract infection, site not specified: Secondary | ICD-10-CM

## 2015-06-19 LAB — POCT URINALYSIS DIPSTICK
BILIRUBIN UA: NEGATIVE
Blood, UA: NEGATIVE
GLUCOSE UA: NEGATIVE
Ketones, UA: NEGATIVE
NITRITE UA: NEGATIVE
Protein, UA: NEGATIVE
SPEC GRAV UA: 1.02
Urobilinogen, UA: 0.2
pH, UA: 6

## 2015-06-19 NOTE — Patient Instructions (Signed)
Please stay well hydrated and continue the Flomax. Finish all of the antibiotic (Keflex), taking as directed. Start a daily probiotic.   Follow-up if any symptoms recur.

## 2015-06-19 NOTE — Progress Notes (Signed)
Patient presents to clinic today for follow-up of UTI. Patient seen in ER on 06/13/15 for complaints of UTI symptoms. Was diagnosed with UTI and given Rx for Keflex. Endorses taking Keflex as directed with Flomax. Endorses resolution of symptoms without recurrence or new symptoms. Has 2 more days of antibiotic left.  Past Medical History  Diagnosis Date  . BPH (benign prostatic hyperplasia)     hx s/p turp  . Hyperlipidemia     takes Simvastatin and Tricor daily-under control  . Joint pain     both knees  . Joint swelling     rt knee  . Chronic back pain     spondylosis  . Melanoma     right hand;basal cell carcinoma  . H/O hiatal hernia   . History of colon polyps   . Diverticulitis   . Neuropathy   . Prostate infection     currently taking macrobid  . Diarrhea     x 1 today  . History of chicken pox   . Urinary incontinence   . UTI (lower urinary tract infection)   . Back pain at L4-L5 level   . Arthritis     both knees  . DDD (degenerative disc disease), lumbar   . History of kidney stones   . Diabetes mellitus     takes Metformin and Victoza daily-under control  . Hepatitis     B-with Mono and jaundice 1960  . GERD (gastroesophageal reflux disease)     takes Omeprazole daily-under control  . Hypertension     takes Lisinopril daily-under control  . H/O measles   . Pneumonia     hx of  . Tinnitus     Current Outpatient Prescriptions on File Prior to Visit  Medication Sig Dispense Refill  . cephALEXin (KEFLEX) 500 MG capsule Take 1 capsule (500 mg total) by mouth 4 (four) times daily. 40 capsule 0  . fenofibrate (TRICOR) 145 MG tablet Take 1 tablet (145 mg total) by mouth every evening. 90 tablet 1  . Insulin Pen Needle (CAREFINE PEN NEEDLES) 31G X 8 MM MISC Use once daily with Victoza injections. 100 each 3  . Lancets (FREESTYLE) lancets 1 each by Other route 2 (two) times daily. Use as instructed 100 each 12  . Liraglutide 18 MG/3ML SOPN Inject 0.3 mLs (1.8 mg  total) into the skin daily. 6 mL 3  . lisinopril (PRINIVIL,ZESTRIL) 10 MG tablet Take 1 tablet (10 mg total) by mouth every evening. 90 tablet 1  . metFORMIN (GLUCOPHAGE-XR) 500 MG 24 hr tablet Take 1 tablet (500 mg total) by mouth 2 (two) times daily with a meal. 180 tablet 1  . omeprazole (PRILOSEC) 20 MG capsule Take 1 capsule (20 mg total) by mouth daily. 90 capsule 1  . simvastatin (ZOCOR) 10 MG tablet Take 1 tablet (10 mg total) by mouth at bedtime. 90 tablet 1  . tamsulosin (FLOMAX) 0.4 MG CAPS capsule Take 0.4 mg by mouth every evening.     . methocarbamol (ROBAXIN) 500 MG tablet Take 1 tablet (500 mg total) by mouth every 6 (six) hours as needed for muscle spasms. (Patient not taking: Reported on 06/19/2015) 40 tablet 1  . rivaroxaban (XARELTO) 10 MG TABS tablet Take 1 tablet (10 mg total) by mouth daily with breakfast. (Patient not taking: Reported on 06/19/2015) 20 tablet 0   No current facility-administered medications on file prior to visit.    Allergies  Allergen Reactions  . Albamycin [Novobiocin]  Made me look like a strawberry   . Shrimp [Shellfish Allergy] Itching    Unsure if allergic - has been eating fried fish  And shell fish since with no reactions  . Penicillins Rash    Family History  Problem Relation Age of Onset  . Heart disease Mother 37    Deceased  . Bladder Cancer Mother   . Prostate cancer Father     Deceased-in 21s  . Healthy Brother   . Skin cancer Brother     #2  . Asthma Sister     Deceased  . Healthy Son     #1  . Healthy Daughter     #2  . Obesity Son     #2    History   Social History  . Marital Status: Married    Spouse Name: N/A  . Number of Children: N/A  . Years of Education: N/A   Social History Main Topics  . Smoking status: Former Smoker    Types: Pipe    Quit date: 07/14/1978  . Smokeless tobacco: Never Used     Comment: quit smoking 1979  . Alcohol Use: Yes     Comment: OCCASIONAL  . Drug Use: No  . Sexual  Activity: Yes   Other Topics Concern  . None   Social History Narrative   Review of Systems - See HPI.  All other ROS are negative.  BP 119/47 mmHg  Pulse 88  Temp(Src) 97.9 F (36.6 C) (Oral)  Ht 5' 10"  (1.778 m)  Wt 222 lb 6.4 oz (100.88 kg)  BMI 31.91 kg/m2  SpO2 100%  Physical Exam  Constitutional: He is oriented to person, place, and time and well-developed, well-nourished, and in no distress.  HENT:  Head: Normocephalic and atraumatic.  Eyes: Conjunctivae are normal.  Neck: Neck supple.  Cardiovascular: Normal rate, regular rhythm, normal heart sounds and intact distal pulses.   Pulmonary/Chest: Effort normal.  Abdominal: Soft. Bowel sounds are normal. He exhibits no distension and no mass. There is no tenderness. There is no rebound and no guarding.  Neurological: He is alert and oriented to person, place, and time.  Skin: Skin is warm and dry. No rash noted.  Psychiatric: Affect normal.  Vitals reviewed.   Recent Results (from the past 2160 hour(s))  Surgical pcr screen     Status: None   Collection Time: 04/28/15  9:49 AM  Result Value Ref Range   MRSA, PCR NEGATIVE NEGATIVE   Staphylococcus aureus NEGATIVE NEGATIVE    Comment:        The Xpert SA Assay (FDA approved for NASAL specimens in patients over 21 years of age), is one component of a comprehensive surveillance program.  Test performance has been validated by [facility] for patients greater than or equal to 37 year old. It is not intended to diagnose infection nor to guide or monitor treatment.   Urinalysis, Routine w reflex microscopic     Status: None   Collection Time: 04/28/15  9:52 AM  Result Value Ref Range   Color, Urine YELLOW YELLOW   APPearance CLEAR CLEAR   Specific Gravity, Urine 1.022 1.005 - 1.030   pH 6.5 5.0 - 8.0   Glucose, UA NEGATIVE NEGATIVE mg/dL   Hgb urine dipstick NEGATIVE NEGATIVE   Bilirubin Urine NEGATIVE NEGATIVE   Ketones, ur NEGATIVE NEGATIVE mg/dL    Protein, ur NEGATIVE NEGATIVE mg/dL   Urobilinogen, UA 0.2 0.0 - 1.0 mg/dL   Nitrite NEGATIVE NEGATIVE  Leukocytes, UA NEGATIVE NEGATIVE    Comment: MICROSCOPIC NOT DONE ON URINES WITH NEGATIVE PROTEIN, BLOOD, LEUKOCYTES, NITRITE, OR GLUCOSE <1000 mg/dL.  APTT     Status: None   Collection Time: 04/28/15 10:15 AM  Result Value Ref Range   aPTT 34 24 - 37 seconds  CBC     Status: None   Collection Time: 04/28/15 10:15 AM  Result Value Ref Range   WBC 5.8 4.0 - 10.5 K/uL   RBC 5.13 4.22 - 5.81 MIL/uL   Hemoglobin 14.8 13.0 - 17.0 g/dL   HCT 45.8 39.0 - 52.0 %   MCV 89.3 78.0 - 100.0 fL   MCH 28.8 26.0 - 34.0 pg   MCHC 32.3 30.0 - 36.0 g/dL   RDW 12.9 11.5 - 15.5 %   Platelets 264 150 - 400 K/uL  Comprehensive metabolic panel     Status: Abnormal   Collection Time: 04/28/15 10:15 AM  Result Value Ref Range   Sodium 143 135 - 145 mmol/L   Potassium 4.5 3.5 - 5.1 mmol/L   Chloride 106 101 - 111 mmol/L   CO2 27 22 - 32 mmol/L   Glucose, Bld 104 (H) 65 - 99 mg/dL   BUN 20 6 - 20 mg/dL   Creatinine, Ser 0.93 0.61 - 1.24 mg/dL   Calcium 10.0 8.9 - 10.3 mg/dL   Total Protein 7.8 6.5 - 8.1 g/dL   Albumin 4.3 3.5 - 5.0 g/dL   AST 16 15 - 41 U/L   ALT 24 17 - 63 U/L   Alkaline Phosphatase 45 38 - 126 U/L   Total Bilirubin 0.8 0.3 - 1.2 mg/dL   GFR calc non Af Amer >60 >60 mL/min   GFR calc Af Amer >60 >60 mL/min    Comment: (NOTE) The eGFR has been calculated using the CKD EPI equation. This calculation has not been validated in all clinical situations. eGFR's persistently <60 mL/min signify possible Chronic Kidney Disease.    Anion gap 10 5 - 15  Protime-INR     Status: None   Collection Time: 04/28/15 10:15 AM  Result Value Ref Range   Prothrombin Time 12.8 11.6 - 15.2 seconds   INR 0.95 0.00 - 1.49  Type and screen     Status: None   Collection Time: 04/28/15 10:15 AM  Result Value Ref Range   ABO/RH(D) O NEG    Antibody Screen NEG    Sample Expiration 05/07/2015     ABO/Rh     Status: None   Collection Time: 04/28/15 10:15 AM  Result Value Ref Range   ABO/RH(D) O NEG   Glucose, capillary     Status: Abnormal   Collection Time: 05/27/15 10:31 AM  Result Value Ref Range   Glucose-Capillary 112 (H) 65 - 99 mg/dL   Comment 1 Notify RN   Type and screen     Status: None   Collection Time: 05/27/15 10:55 AM  Result Value Ref Range   ABO/RH(D) O NEG    Antibody Screen NEG    Sample Expiration 05/30/2015   Glucose, capillary     Status: Abnormal   Collection Time: 05/27/15  2:48 PM  Result Value Ref Range   Glucose-Capillary 109 (H) 65 - 99 mg/dL  Glucose, capillary     Status: Abnormal   Collection Time: 05/27/15  4:55 PM  Result Value Ref Range   Glucose-Capillary 129 (H) 65 - 99 mg/dL  Glucose, capillary     Status: Abnormal   Collection  Time: 05/27/15 10:02 PM  Result Value Ref Range   Glucose-Capillary 202 (H) 65 - 99 mg/dL   Comment 1 Notify RN   CBC     Status: Abnormal   Collection Time: 05/28/15  4:10 AM  Result Value Ref Range   WBC 10.7 (H) 4.0 - 10.5 K/uL   RBC 4.44 4.22 - 5.81 MIL/uL   Hemoglobin 12.7 (L) 13.0 - 17.0 g/dL   HCT 39.0 39.0 - 52.0 %   MCV 87.8 78.0 - 100.0 fL   MCH 28.6 26.0 - 34.0 pg   MCHC 32.6 30.0 - 36.0 g/dL   RDW 12.8 11.5 - 15.5 %   Platelets 247 150 - 400 K/uL  Basic metabolic panel     Status: Abnormal   Collection Time: 05/28/15  4:10 AM  Result Value Ref Range   Sodium 140 135 - 145 mmol/L   Potassium 4.1 3.5 - 5.1 mmol/L   Chloride 106 101 - 111 mmol/L   CO2 26 22 - 32 mmol/L   Glucose, Bld 194 (H) 65 - 99 mg/dL   BUN 18 6 - 20 mg/dL   Creatinine, Ser 1.10 0.61 - 1.24 mg/dL   Calcium 9.1 8.9 - 10.3 mg/dL   GFR calc non Af Amer >60 >60 mL/min   GFR calc Af Amer >60 >60 mL/min    Comment: (NOTE) The eGFR has been calculated using the CKD EPI equation. This calculation has not been validated in all clinical situations. eGFR's persistently <60 mL/min signify possible Chronic Kidney Disease.     Anion gap 8 5 - 15  Glucose, capillary     Status: Abnormal   Collection Time: 05/28/15  6:55 AM  Result Value Ref Range   Glucose-Capillary 149 (H) 65 - 99 mg/dL   Comment 1 Notify RN   Glucose, capillary     Status: Abnormal   Collection Time: 05/28/15 11:29 AM  Result Value Ref Range   Glucose-Capillary 159 (H) 65 - 99 mg/dL  Glucose, capillary     Status: Abnormal   Collection Time: 05/28/15  5:14 PM  Result Value Ref Range   Glucose-Capillary 119 (H) 65 - 99 mg/dL  Glucose, capillary     Status: Abnormal   Collection Time: 05/28/15  9:58 PM  Result Value Ref Range   Glucose-Capillary 175 (H) 65 - 99 mg/dL  CBC     Status: Abnormal   Collection Time: 05/29/15  4:15 AM  Result Value Ref Range   WBC 8.9 4.0 - 10.5 K/uL   RBC 4.25 4.22 - 5.81 MIL/uL   Hemoglobin 11.9 (L) 13.0 - 17.0 g/dL   HCT 36.7 (L) 39.0 - 52.0 %   MCV 86.4 78.0 - 100.0 fL   MCH 28.0 26.0 - 34.0 pg   MCHC 32.4 30.0 - 36.0 g/dL   RDW 13.2 11.5 - 15.5 %   Platelets 226 150 - 400 K/uL  Basic metabolic panel     Status: Abnormal   Collection Time: 05/29/15  4:15 AM  Result Value Ref Range   Sodium 140 135 - 145 mmol/L   Potassium 3.5 3.5 - 5.1 mmol/L   Chloride 109 101 - 111 mmol/L   CO2 23 22 - 32 mmol/L   Glucose, Bld 140 (H) 65 - 99 mg/dL   BUN 17 6 - 20 mg/dL   Creatinine, Ser 0.92 0.61 - 1.24 mg/dL   Calcium 8.8 (L) 8.9 - 10.3 mg/dL   GFR calc non Af Amer >60 >60 mL/min  GFR calc Af Amer >60 >60 mL/min    Comment: (NOTE) The eGFR has been calculated using the CKD EPI equation. This calculation has not been validated in all clinical situations. eGFR's persistently <60 mL/min signify possible Chronic Kidney Disease.    Anion gap 8 5 - 15  Glucose, capillary     Status: Abnormal   Collection Time: 05/29/15  7:04 AM  Result Value Ref Range   Glucose-Capillary 145 (H) 65 - 99 mg/dL  Urinalysis, Routine w reflex microscopic (not at [facility])     Status: Abnormal   Collection Time: 06/13/15  4:21  PM  Result Value Ref Range   Color, Urine YELLOW YELLOW   APPearance CLOUDY (A) CLEAR   Specific Gravity, Urine 1.024 1.005 - 1.030   pH 6.0 5.0 - 8.0   Glucose, UA NEGATIVE NEGATIVE mg/dL   Hgb urine dipstick SMALL (A) NEGATIVE   Bilirubin Urine NEGATIVE NEGATIVE   Ketones, ur NEGATIVE NEGATIVE mg/dL   Protein, ur 30 (A) NEGATIVE mg/dL   Urobilinogen, UA 1.0 0.0 - 1.0 mg/dL   Nitrite NEGATIVE NEGATIVE   Leukocytes, UA LARGE (A) NEGATIVE  Urine microscopic-add on     Status: Abnormal   Collection Time: 06/13/15  4:21 PM  Result Value Ref Range   Squamous Epithelial / LPF RARE RARE   WBC, UA TOO NUMEROUS TO COUNT <3 WBC/hpf   RBC / HPF 7-10 <3 RBC/hpf   Bacteria, UA MANY (A) RARE   Casts HYALINE CASTS (A) NEGATIVE  Urine culture     Status: None   Collection Time: 06/13/15  4:21 PM  Result Value Ref Range   Specimen Description URINE, CLEAN CATCH    Special Requests NONE    Culture      >=100,000 COLONIES/mL PROTEUS MIRABILIS Performed at [facility]    Report Status 06/16/2015 FINAL    Organism ID, Bacteria PROTEUS MIRABILIS       Susceptibility   Proteus mirabilis - MIC*    AMPICILLIN <=2 SENSITIVE Sensitive     CEFAZOLIN <=4 SENSITIVE Sensitive     CEFTRIAXONE <=1 SENSITIVE Sensitive     CIPROFLOXACIN <=0.25 SENSITIVE Sensitive     GENTAMICIN <=1 SENSITIVE Sensitive     IMIPENEM 2 SENSITIVE Sensitive     NITROFURANTOIN 128 RESISTANT Resistant     TRIMETH/SULFA <=20 SENSITIVE Sensitive     AMPICILLIN/SULBACTAM <=2 SENSITIVE Sensitive     PIP/TAZO <=4 SENSITIVE Sensitive     * >=100,000 COLONIES/mL PROTEUS MIRABILIS  POCT urinalysis dipstick     Status: Abnormal   Collection Time: 06/19/15  2:47 PM  Result Value Ref Range   Color, UA yellow    Clarity, UA clear    Glucose, UA neg    Bilirubin, UA neg    Ketones, UA neg    Spec Grav, UA 1.020    Blood, UA neg    pH, UA 6.0    Protein, UA neg    Urobilinogen, UA 0.2    Nitrite, UA neg    Leukocytes,  UA small (1+) (A) Negative    Assessment/Plan: Urinary tract infectious disease Resolving. Asymptomatic. Finish Keflex as cultures indicate susceptibility. Continue Flomax as directed. Begin probiotic. Follow-up if symptoms recur.

## 2015-06-19 NOTE — Progress Notes (Signed)
Pre visit review using our clinic review tool, if applicable. No additional management support is needed unless otherwise documented below in the visit note. 

## 2015-06-20 DIAGNOSIS — N390 Urinary tract infection, site not specified: Secondary | ICD-10-CM | POA: Insufficient documentation

## 2015-06-20 NOTE — Assessment & Plan Note (Signed)
Resolving. Asymptomatic. Finish Keflex as cultures indicate susceptibility. Continue Flomax as directed. Begin probiotic. Follow-up if symptoms recur.

## 2015-06-23 ENCOUNTER — Ambulatory Visit: Payer: Medicare Other | Admitting: Physical Therapy

## 2015-06-23 DIAGNOSIS — R269 Unspecified abnormalities of gait and mobility: Secondary | ICD-10-CM | POA: Diagnosis not present

## 2015-06-23 DIAGNOSIS — M25661 Stiffness of right knee, not elsewhere classified: Secondary | ICD-10-CM | POA: Diagnosis not present

## 2015-06-23 DIAGNOSIS — R262 Difficulty in walking, not elsewhere classified: Secondary | ICD-10-CM

## 2015-06-23 DIAGNOSIS — M25561 Pain in right knee: Secondary | ICD-10-CM | POA: Diagnosis not present

## 2015-06-23 NOTE — Therapy (Signed)
[facility] [address]  [place], [place], [address] Phone: [phone]   Fax:  [phone]  Physical Therapy Treatment  Patient Details  Name: Matthew Nash MRN: [id number] Date of Birth: 06-07-1936 Referring Provider:  Gaynelle Arabian, MD  Encounter Date: 06/23/2015      PT End of Session - 06/23/15 1026    Visit Number 5   Number of Visits 8   Date for PT Re-Evaluation 07/07/15   PT Start Time 1024   PT Stop Time 1110   PT Time Calculation (min) 46 min      Past Medical History  Diagnosis Date  . BPH (benign prostatic hyperplasia)     hx s/p turp  . Hyperlipidemia     takes Simvastatin and Tricor daily-under control  . Joint pain     both knees  . Joint swelling     rt knee  . Chronic back pain     spondylosis  . Melanoma     right hand;basal cell carcinoma  . H/O hiatal hernia   . History of colon polyps   . Diverticulitis   . Neuropathy   . Prostate infection     currently taking macrobid  . Diarrhea     x 1 today  . History of chicken pox   . Urinary incontinence   . UTI (lower urinary tract infection)   . Back pain at L4-L5 level   . Arthritis     both knees  . DDD (degenerative disc disease), lumbar   . History of kidney stones   . Diabetes mellitus     takes Metformin and Victoza daily-under control  . Hepatitis     B-with Mono and jaundice 1960  . GERD (gastroesophageal reflux disease)     takes Omeprazole daily-under control  . Hypertension     takes Lisinopril daily-under control  . H/O measles   . Pneumonia     hx of  . Tinnitus     Past Surgical History  Procedure Laterality Date  . Shoulder arthroscopy w/ rotator cuff repair Left NOV 2010  . Transurethral resection of prostate  2006  . Cortisone injection    . I&d left knee Left 1958    states 22 times  . C5 and t1 bone chip removed   1986  . Cholecystectomy  1993  . Right shoulder surgery  2012    rotator  cuff repair  . Colonoscopy  2011  . Lumbar laminectomy/decompression microdiscectomy Right 02/17/2014    Procedure: RIGHT LUMBAR TWO-THREE LAMINECTOMY;  Surgeon: Charlie Pitter, MD;  Location: [facility];  Service: Neurosurgery;  Laterality: Right;  right   . Knee arthroscopy Right 07/16/2014    Procedure: RIGHT ARTHROSCOPY KNEE WITH Medial and Lateral DEBRIDEMENT and chondroplasty;  Surgeon: Gearlean Alf, MD;  Location: [facility];  Service: Orthopedics;  Laterality: Right;  . Wisdom tooth extraction    . Basal cell carcinoma excision    . Vasectomy  1983  . Tonsillectomy  1942  . Cauterize inner nose  1957  . Total knee arthroplasty Right 05/27/2015    Procedure: RIGHT TOTAL KNEE ARTHROPLASTY;  Surgeon: Gaynelle Arabian, MD;  Location: [facility];  Service: Orthopedics;  Laterality: Right;    There were no vitals filed for this visit.  Visit Diagnosis:  Knee stiffness, right  Difficulty walking  Abnormality of gait  Right knee pain      Subjective Assessment - 06/23/15 1030  Subjective Had a UTI last week and was not able to perform HEP regularly but feeling much better this week.  States has not fallen lately.  States is able to get in/out shower independently and has been helping wife some in kitchen.  Still not driving.  States performs HEP regularaly to include extension stretching and strengthening.   Currently in Pain? No/denies            [facility] PT Assessment - 06/23/15 0001    AROM   Overall AROM Comments R Knee 13-   PROM   Overall PROM Comments R Knee 3-120 after manual      TODAY'S TREATMENT TherEx - Rec Bike lvl 1-2, 4' Manual - R Knee Flexion and Extension mobes grade 3 (good improvement following manual, quite stiff prior to manual especially into extension) TherEx - QS 10x3" into towel roll SAQ 0# 15x QS into SLR 12x Standing TKE Black TB 15x TRX DL Squat 10x (able to perform deep squat, requires SBA due to tendency to push himself too much into a deep  squat) 8" R FW Step-up with RW A 15x                        PT Education - 06/23/15 1127    Education provided Yes   Education Details HEP progression   Person(s) Educated Patient   Methods Explanation;Demonstration;Handout   Comprehension Verbalized understanding;Returned demonstration             PT Long Term Goals - 06/23/15 1123    PT LONG TERM GOAL #1   Title R Knee AROM 5-120 or better by 07/07/15   Status On-going   PT LONG TERM GOAL #2   Title pt able to ambulate with SPC over level and uneven terrain with good mechanics and distances not limited by pain by 07/07/15  has progressed to [facility], still with impaired mechanics (lacking TKE at heel strike)   Status Partially Met               Plan - 06/23/15 1124    Clinical Impression Statement good R knee flexion ROM but little to no change in TKE since beginning PT.  Gait mechanics impaired due to lacking TKE.  Pt states he is performing HEP working on this - I progressed HEP today to include CKC TKE along with more aggressive knee extension stretching.   PT Next Visit Plan knee TKE strengthening and AROM; gait and balance training   Consulted and Agree with Plan of Care Patient        Problem List Patient Active Problem List   Diagnosis Date Noted  . Urinary tract infectious disease 06/20/2015  . OA (osteoarthritis) of knee 05/27/2015  . Diabetes mellitus type II, controlled 11/26/2014  . Gastroesophageal reflux disease without esophagitis 11/26/2014  . Arthritis of both knees 11/26/2014  . Absence of bladder continence 11/26/2014  . Hyperlipidemia LDL goal <100 11/26/2014  . Acute medial meniscal tear 07/16/2014  . Lumbosacral spondylosis without myelopathy 02/17/2014  . Spondylosis, lumbosacral 02/17/2014    Telly Jawad PT, OCS 06/23/2015, 11:30 AM  [facility] [address]  [place], [place], [address] Phone:  [phone]   Fax:  [phone]

## 2015-06-26 ENCOUNTER — Ambulatory Visit: Payer: Medicare Other | Admitting: Rehabilitation

## 2015-06-26 DIAGNOSIS — R262 Difficulty in walking, not elsewhere classified: Secondary | ICD-10-CM | POA: Diagnosis not present

## 2015-06-26 DIAGNOSIS — R269 Unspecified abnormalities of gait and mobility: Secondary | ICD-10-CM | POA: Diagnosis not present

## 2015-06-26 DIAGNOSIS — M25661 Stiffness of right knee, not elsewhere classified: Secondary | ICD-10-CM | POA: Diagnosis not present

## 2015-06-26 DIAGNOSIS — M25561 Pain in right knee: Secondary | ICD-10-CM

## 2015-06-26 NOTE — Therapy (Signed)
[facility] [address]  [place], [place], [address] Phone: [phone]   Fax:  [phone]  Physical Therapy Treatment  Patient Details  Name: Matthew Nash MRN: [id number] Date of Birth: 20-Oct-1936 Referring Provider:  Gaynelle Arabian, MD  Encounter Date: 06/26/2015      PT End of Session - 06/26/15 1015    Visit Number 6   Number of Visits 8   Date for PT Re-Evaluation 07/07/15   PT Start Time 1007   PT Stop Time 1101   PT Time Calculation (min) 54 min      Past Medical History  Diagnosis Date  . BPH (benign prostatic hyperplasia)     hx s/p turp  . Hyperlipidemia     takes Simvastatin and Tricor daily-under control  . Joint pain     both knees  . Joint swelling     rt knee  . Chronic back pain     spondylosis  . Melanoma     right hand;basal cell carcinoma  . H/O hiatal hernia   . History of colon polyps   . Diverticulitis   . Neuropathy   . Prostate infection     currently taking macrobid  . Diarrhea     x 1 today  . History of chicken pox   . Urinary incontinence   . UTI (lower urinary tract infection)   . Back pain at L4-L5 level   . Arthritis     both knees  . DDD (degenerative disc disease), lumbar   . History of kidney stones   . Diabetes mellitus     takes Metformin and Victoza daily-under control  . Hepatitis     B-with Mono and jaundice 1960  . GERD (gastroesophageal reflux disease)     takes Omeprazole daily-under control  . Hypertension     takes Lisinopril daily-under control  . H/O measles   . Pneumonia     hx of  . Tinnitus     Past Surgical History  Procedure Laterality Date  . Shoulder arthroscopy w/ rotator cuff repair Left NOV 2010  . Transurethral resection of prostate  2006  . Cortisone injection    . I&d left knee Left 1958    states 22 times  . C5 and t1 bone chip removed   1986  . Cholecystectomy  1993  . Right shoulder surgery  2012    rotator  cuff repair  . Colonoscopy  2011  . Lumbar laminectomy/decompression microdiscectomy Right 02/17/2014    Procedure: RIGHT LUMBAR TWO-THREE LAMINECTOMY;  Surgeon: Charlie Pitter, MD;  Location: [facility];  Service: Neurosurgery;  Laterality: Right;  right   . Knee arthroscopy Right 07/16/2014    Procedure: RIGHT ARTHROSCOPY KNEE WITH Medial and Lateral DEBRIDEMENT and chondroplasty;  Surgeon: Gearlean Alf, MD;  Location: [facility];  Service: Orthopedics;  Laterality: Right;  . Wisdom tooth extraction    . Basal cell carcinoma excision    . Vasectomy  1983  . Tonsillectomy  1942  . Cauterize inner nose  1957  . Total knee arthroplasty Right 05/27/2015    Procedure: RIGHT TOTAL KNEE ARTHROPLASTY;  Surgeon: Gaynelle Arabian, MD;  Location: [facility];  Service: Orthopedics;  Laterality: Right;    There were no vitals filed for this visit.  Visit Diagnosis:  Knee stiffness, right  Difficulty walking  Abnormality of gait  Right knee pain      Subjective Assessment - 06/26/15 1016  Subjective Noted increased pain after last time (3/10) but that went away. Has been complaint with new HEP exercises.    Currently in Pain? No/denies     TODAY'S TREATMENT TherEx - Rec Bike lvl 1, 4'  SAQ 0# 10x5", 3# 15x5" FOB (peanut) QS+Hip Extension 10x3" Hamstring Stretch 3x20"  Manual - R Knee Flexion and Extension mobes grade 2-3. PROM into Flexion and Extension. Tibial AP/PA's at 90 degrees flexion Grade 2-3.  TherEx - Bridges 15x3" Fitter (2 Black) Seated Leg Press 15x Standing TKE with ball on wall 15x5" Squats in RW 15x 8" Step ups with RW assist 10x (medial knee pain developing)  Vasopneumatic, medium, 44 degrees, x10' Rt Knee.        PT Long Term Goals - 06/26/15 1055    PT LONG TERM GOAL #1   Title R Knee AROM 5-120 or better by 07/07/15   Status On-going   PT LONG TERM GOAL #2   Title pt able to ambulate with SPC over level and uneven terrain with good mechanics and distances not  limited by pain by 07/07/15   Status On-going               Plan - 06/26/15 1054    Clinical Impression Statement Good progress with exercises today, still mainly focused on knee extension. Good tolerance to manual work with some pain and discomfort during knee extension mobs.    PT Next Visit Plan knee TKE strengthening and AROM; gait and balance training   Consulted and Agree with Plan of Care Patient        Problem List Patient Active Problem List   Diagnosis Date Noted  . Urinary tract infectious disease 06/20/2015  . OA (osteoarthritis) of knee 05/27/2015  . Diabetes mellitus type II, controlled 11/26/2014  . Gastroesophageal reflux disease without esophagitis 11/26/2014  . Arthritis of both knees 11/26/2014  . Absence of bladder continence 11/26/2014  . Hyperlipidemia LDL goal <100 11/26/2014  . Acute medial meniscal tear 07/16/2014  . Lumbosacral spondylosis without myelopathy 02/17/2014  . Spondylosis, lumbosacral 02/17/2014    Barbette Hair, PTA 06/26/2015, 10:55 AM  [facility] [address]  [place], [place], [address] Phone: [phone]   Fax:  [phone]

## 2015-06-29 ENCOUNTER — Ambulatory Visit (INDEPENDENT_AMBULATORY_CARE_PROVIDER_SITE_OTHER): Payer: Medicare Other | Admitting: Physician Assistant

## 2015-06-29 ENCOUNTER — Encounter: Payer: Self-pay | Admitting: Physician Assistant

## 2015-06-29 VITALS — BP 138/71 | HR 79 | Temp 97.7°F | Ht 70.0 in | Wt 224.8 lb

## 2015-06-29 DIAGNOSIS — R399 Unspecified symptoms and signs involving the genitourinary system: Secondary | ICD-10-CM

## 2015-06-29 DIAGNOSIS — R8290 Unspecified abnormal findings in urine: Secondary | ICD-10-CM

## 2015-06-29 DIAGNOSIS — R8299 Other abnormal findings in urine: Secondary | ICD-10-CM | POA: Diagnosis not present

## 2015-06-29 DIAGNOSIS — R350 Frequency of micturition: Secondary | ICD-10-CM

## 2015-06-29 DIAGNOSIS — R82998 Other abnormal findings in urine: Secondary | ICD-10-CM

## 2015-06-29 DIAGNOSIS — N390 Urinary tract infection, site not specified: Secondary | ICD-10-CM | POA: Diagnosis not present

## 2015-06-29 DIAGNOSIS — R319 Hematuria, unspecified: Secondary | ICD-10-CM | POA: Diagnosis not present

## 2015-06-29 LAB — POCT URINALYSIS DIPSTICK
Bilirubin, UA: NEGATIVE
Glucose, UA: NEGATIVE
Ketones, UA: NEGATIVE
Nitrite, UA: NEGATIVE
Protein, UA: NEGATIVE
Spec Grav, UA: 1.015
Urobilinogen, UA: 0.2
pH, UA: 6.5

## 2015-06-29 MED ORDER — CIPROFLOXACIN HCL 500 MG PO TABS: 500.0000 mg | ORAL_TABLET | Freq: Two times a day (BID) | ORAL | Status: DC

## 2015-06-29 NOTE — Patient Instructions (Signed)
Your symptoms are consistent with a bladder infection, also called acute cystitis. Please take your antibiotic (Ciprofloxacin) as directed until all pills are gone.  Stay very well hydrated.  Consider a daily probiotic (Align, Culturelle, or Activia) to help prevent stomach upset caused by the antibiotic.  Taking a probiotic daily may also help prevent recurrent UTIs.  Also consider taking AZO (Phenazopyridine) tablets to help decrease pain with urination.  I will call you with your urine testing results.  We will change antibiotics if indicated.  Call or return to clinic if symptoms are not resolved by completion of antibiotic.   Urinary Tract Infection A urinary tract infection (UTI) can occur any place along the urinary tract. The tract includes the kidneys, ureters, bladder, and urethra. A type of germ called bacteria often causes a UTI. UTIs are often helped with antibiotic medicine.  HOME CARE   If given, take antibiotics as told by your doctor. Finish them even if you start to feel better.  Drink enough fluids to keep your pee (urine) clear or pale yellow.  Avoid tea, drinks with caffeine, and bubbly (carbonated) drinks.  Pee often. Avoid holding your pee in for a long time.  Pee before and after having sex (intercourse).  Wipe from front to back after you poop (bowel movement) if you are a woman. Use each tissue only once. GET HELP RIGHT AWAY IF:   You have back pain.  You have lower belly (abdominal) pain.  You have chills.  You feel sick to your stomach (nauseous).  You throw up (vomit).  Your burning or discomfort with peeing does not go away.  You have a fever.  Your symptoms are not better in 3 days. MAKE SURE YOU:   Understand these instructions.  Will watch your condition.  Will get help right away if you are not doing well or get worse. Document Released: 05/16/2008 Document Revised: 08/22/2012 Document Reviewed: 06/28/2012 [facility] ([facility]) Patient Information 2015  [place], [place]. This information is not intended to replace advice given to you by your health care provider. Make sure you discuss any questions you have with your health care provider.

## 2015-06-29 NOTE — Progress Notes (Signed)
Pre visit review using our clinic review tool, if applicable. No additional management support is needed unless otherwise documented below in the visit note. 

## 2015-06-30 ENCOUNTER — Ambulatory Visit: Payer: Medicare Other | Admitting: Rehabilitation

## 2015-06-30 DIAGNOSIS — R262 Difficulty in walking, not elsewhere classified: Secondary | ICD-10-CM

## 2015-06-30 DIAGNOSIS — R399 Unspecified symptoms and signs involving the genitourinary system: Secondary | ICD-10-CM | POA: Insufficient documentation

## 2015-06-30 DIAGNOSIS — R269 Unspecified abnormalities of gait and mobility: Secondary | ICD-10-CM | POA: Diagnosis not present

## 2015-06-30 DIAGNOSIS — M25561 Pain in right knee: Secondary | ICD-10-CM

## 2015-06-30 DIAGNOSIS — M25661 Stiffness of right knee, not elsewhere classified: Secondary | ICD-10-CM | POA: Diagnosis not present

## 2015-06-30 NOTE — Therapy (Signed)
[facility] [address]  [place], [place], [address] Phone: [phone]   Fax:  [phone]  Physical Therapy Treatment  Patient Details  Name: Matthew Nash MRN: [id number] Date of Birth: 1936/10/29 Referring Provider:  Gaynelle Arabian, MD  Encounter Date: 06/30/2015      PT End of Session - 06/30/15 1100    Visit Number 7   Number of Visits 8   Date for PT Re-Evaluation 07/07/15   PT Start Time 7711   PT Stop Time 1112   PT Time Calculation (min) 57 min      Past Medical History  Diagnosis Date  . BPH (benign prostatic hyperplasia)     hx s/p turp  . Hyperlipidemia     takes Simvastatin and Tricor daily-under control  . Joint pain     both knees  . Joint swelling     rt knee  . Chronic back pain     spondylosis  . Melanoma     right hand;basal cell carcinoma  . H/O hiatal hernia   . History of colon polyps   . Diverticulitis   . Neuropathy   . Prostate infection     currently taking macrobid  . Diarrhea     x 1 today  . History of chicken pox   . Urinary incontinence   . UTI (lower urinary tract infection)   . Back pain at L4-L5 level   . Arthritis     both knees  . DDD (degenerative disc disease), lumbar   . History of kidney stones   . Diabetes mellitus     takes Metformin and Victoza daily-under control  . Hepatitis     B-with Mono and jaundice 1960  . GERD (gastroesophageal reflux disease)     takes Omeprazole daily-under control  . Hypertension     takes Lisinopril daily-under control  . H/O measles   . Pneumonia     hx of  . Tinnitus     Past Surgical History  Procedure Laterality Date  . Shoulder arthroscopy w/ rotator cuff repair Left NOV 2010  . Transurethral resection of prostate  2006  . Cortisone injection    . I&d left knee Left 1958    states 22 times  . C5 and t1 bone chip removed   1986  . Cholecystectomy  1993  . Right shoulder surgery  2012    rotator  cuff repair  . Colonoscopy  2011  . Lumbar laminectomy/decompression microdiscectomy Right 02/17/2014    Procedure: RIGHT LUMBAR TWO-THREE LAMINECTOMY;  Surgeon: Charlie Pitter, MD;  Location: [facility];  Service: Neurosurgery;  Laterality: Right;  right   . Knee arthroscopy Right 07/16/2014    Procedure: RIGHT ARTHROSCOPY KNEE WITH Medial and Lateral DEBRIDEMENT and chondroplasty;  Surgeon: Gearlean Alf, MD;  Location: [facility];  Service: Orthopedics;  Laterality: Right;  . Wisdom tooth extraction    . Basal cell carcinoma excision    . Vasectomy  1983  . Tonsillectomy  1942  . Cauterize inner nose  1957  . Total knee arthroplasty Right 05/27/2015    Procedure: RIGHT TOTAL KNEE ARTHROPLASTY;  Surgeon: Gaynelle Arabian, MD;  Location: [facility];  Service: Orthopedics;  Laterality: Right;    There were no vitals filed for this visit.  Visit Diagnosis:  Knee stiffness, right  Difficulty walking  Abnormality of gait  Right knee pain      Subjective Assessment - 06/30/15 1030  Subjective States his UTI is back and is back on medication again. Pain levels at knee: at best =0/10, at worst =0.2/10. Pt reports he does note some discomfort/stiffness at times but is mainly painfree. Has been walking with his cane for all community distances and not using an AD while at home. Is independent with all ADL's (including getting in/out of tub and shower, dressing, car transfers).    Currently in Pain? No/denies            Tatyanna Cronk [facility] PT Assessment - 06/30/15 0001    ROM / Strength   AROM / PROM / Strength Strength   AROM   Overall AROM Comments Rt Knee 11-114   PROM   Overall PROM Comments Rt Knee 3-120   Strength   Strength Assessment Site Knee   Right/Left Knee Right   Right Knee Flexion 4+/5  in current ROM   Right Knee Extension 4+/5  in current ROM      TODAY'S TREATMENT TherEx - Rec Bike lvl 1-2, 5'  SAQ 3# 15x5", 5# 15x5" Hamstring Stretch 3x20"  Manual - R Knee Extension mobes  grade 2-3. PROM into Flexion and Extension.  TherEx - TRX Squats 15x Standing TKE with Ball on Wall 15x5" 8" Step ups 12x with 1 HHA on counter and 1 HHA on cane (4"step under cane) 6" Lateral Step ups 10x 1 HHA on counter and 1 HHA on cane (noted medial knee pain) 6" Eccentric Reach downs 10x 1 HHA on counter and 1 HHA on cane  Vasopneumatic, medium, 38 degrees, x10' Rt Knee.        PT Long Term Goals - 06/30/15 1054    PT LONG TERM GOAL #1   Title R Knee AROM 5-120 or better by 07/07/15  current ROM 11-114   Status On-going   PT LONG TERM GOAL #2   Title pt able to ambulate with SPC over level and uneven terrain with good mechanics and distances not limited by pain by 07/07/15  Still lacks TKE creating abnormal gait pattern/mechanics   Status Partially Met               Plan - 06/30/15 1100    Clinical Impression Statement Pt has made excellent progress with therapy and is now independent with ADL's, walking with his cane, and excellent flexion AROM. Continues to lack TKE which is creating his abnormal gait pattern. We have been stretching his knee into extension as well as mobilization to help with this.    PT Next Visit Plan knee TKE strengthening and AROM; gait and balance training   Consulted and Agree with Plan of Care Patient        Problem List Patient Active Problem List   Diagnosis Date Noted  . UTI symptoms 06/30/2015  . Urinary tract infectious disease 06/20/2015  . OA (osteoarthritis) of knee 05/27/2015  . Diabetes mellitus type II, controlled 11/26/2014  . Gastroesophageal reflux disease without esophagitis 11/26/2014  . Arthritis of both knees 11/26/2014  . Absence of bladder continence 11/26/2014  . Hyperlipidemia LDL goal <100 11/26/2014  . Acute medial meniscal tear 07/16/2014  . Lumbosacral spondylosis without myelopathy 02/17/2014  . Spondylosis, lumbosacral 02/17/2014    Barbette Hair, PTA 06/30/2015, 11:05 AM  [facility] [address]  [place], [place], [address] Phone: [phone]   Fax:  [phone]

## 2015-06-30 NOTE — Assessment & Plan Note (Signed)
Urine dip + LE and blood. Will send for culture but begin ABX. Previous culture sensitivity reviewed. Will begin Cipro 500 mg BID x 10 days. Will alter based on culture results. Increase fluids. Probiotic. Follow-up if symptoms are not resolving.

## 2015-06-30 NOTE — Progress Notes (Signed)
Patient presents to clinic today c/o urinary urgency, frequency and dysuria x 1 day. Patient recently treated for UTI by ER MD with Keflex. Symptoms had completely resolved before recurrence of symptoms. Denies fever, chills, nausea/vomiting or flank pain.  Past Medical History  Diagnosis Date  . BPH (benign prostatic hyperplasia)     hx s/p turp  . Hyperlipidemia     takes Simvastatin and Tricor daily-under control  . Joint pain     both knees  . Joint swelling     rt knee  . Chronic back pain     spondylosis  . Melanoma     right hand;basal cell carcinoma  . H/O hiatal hernia   . History of colon polyps   . Diverticulitis   . Neuropathy   . Prostate infection     currently taking macrobid  . Diarrhea     x 1 today  . History of chicken pox   . Urinary incontinence   . UTI (lower urinary tract infection)   . Back pain at L4-L5 level   . Arthritis     both knees  . DDD (degenerative disc disease), lumbar   . History of kidney stones   . Diabetes mellitus     takes Metformin and Victoza daily-under control  . Hepatitis     B-with Mono and jaundice 1960  . GERD (gastroesophageal reflux disease)     takes Omeprazole daily-under control  . Hypertension     takes Lisinopril daily-under control  . H/O measles   . Pneumonia     hx of  . Tinnitus     Current Outpatient Prescriptions on File Prior to Visit  Medication Sig Dispense Refill  . fenofibrate (TRICOR) 145 MG tablet Take 1 tablet (145 mg total) by mouth every evening. 90 tablet 1  . Insulin Pen Needle (CAREFINE PEN NEEDLES) 31G X 8 MM MISC Use once daily with Victoza injections. 100 each 3  . Lancets (FREESTYLE) lancets 1 each by Other route 2 (two) times daily. Use as instructed 100 each 12  . Liraglutide 18 MG/3ML SOPN Inject 0.3 mLs (1.8 mg total) into the skin daily. 6 mL 3  . lisinopril (PRINIVIL,ZESTRIL) 10 MG tablet Take 1 tablet (10 mg total) by mouth every evening. 90 tablet 1  . metFORMIN  (GLUCOPHAGE-XR) 500 MG 24 hr tablet Take 1 tablet (500 mg total) by mouth 2 (two) times daily with a meal. 180 tablet 1  . omeprazole (PRILOSEC) 20 MG capsule Take 1 capsule (20 mg total) by mouth daily. 90 capsule 1  . simvastatin (ZOCOR) 10 MG tablet Take 1 tablet (10 mg total) by mouth at bedtime. 90 tablet 1  . tamsulosin (FLOMAX) 0.4 MG CAPS capsule Take 0.4 mg by mouth every evening.      No current facility-administered medications on file prior to visit.    Allergies  Allergen Reactions  . Albamycin [Novobiocin]     Made me look like a strawberry   . Shrimp [Shellfish Allergy] Itching    Unsure if allergic - has been eating fried fish  And shell fish since with no reactions  . Penicillins Rash    Family History  Problem Relation Age of Onset  . Heart disease Mother 22    Deceased  . Bladder Cancer Mother   . Prostate cancer Father     Deceased-in 16s  . Healthy Brother   . Skin cancer Brother     #2  . Asthma Sister  Deceased  . Healthy Son     #1  . Healthy Daughter     #2  . Obesity Son     #2    History   Social History  . Marital Status: Married    Spouse Name: N/A  . Number of Children: N/A  . Years of Education: N/A   Social History Main Topics  . Smoking status: Former Smoker    Types: Pipe    Quit date: 07/14/1978  . Smokeless tobacco: Never Used     Comment: quit smoking 1979  . Alcohol Use: Yes     Comment: OCCASIONAL  . Drug Use: No  . Sexual Activity: Yes   Other Topics Concern  . None   Social History Narrative    Review of Systems - See HPI.  All other ROS are negative.  BP 138/71 mmHg  Pulse 79  Temp(Src) 97.7 F (36.5 C) (Oral)  Ht 5' 10"  (1.778 m)  Wt 224 lb 12.8 oz (101.969 kg)  BMI 32.26 kg/m2  SpO2 100%  Physical Exam  Constitutional: He is oriented to person, place, and time and well-developed, well-nourished, and in no distress.  HENT:  Head: Normocephalic and atraumatic.  Eyes: Conjunctivae are normal.    Cardiovascular: Normal rate, regular rhythm, normal heart sounds and intact distal pulses.   Pulmonary/Chest: Effort normal and breath sounds normal. No respiratory distress. He has no wheezes. He has no rales. He exhibits no tenderness.  Abdominal: Soft. Bowel sounds are normal. He exhibits no distension. There is no tenderness.  Neurological: He is alert and oriented to person, place, and time.  Skin: Skin is warm and dry. No rash noted.  Psychiatric: Affect normal.  Vitals reviewed.   Recent Results (from the past 2160 hour(s))  Surgical pcr screen     Status: None   Collection Time: 04/28/15  9:49 AM  Result Value Ref Range   MRSA, PCR NEGATIVE NEGATIVE   Staphylococcus aureus NEGATIVE NEGATIVE    Comment:        The Xpert SA Assay (FDA approved for NASAL specimens in patients over 22 years of age), is one component of a comprehensive surveillance program.  Test performance has been validated by [facility] for patients greater than or equal to 79 year old. It is not intended to diagnose infection nor to guide or monitor treatment.   Urinalysis, Routine w reflex microscopic     Status: None   Collection Time: 04/28/15  9:52 AM  Result Value Ref Range   Color, Urine YELLOW YELLOW   APPearance CLEAR CLEAR   Specific Gravity, Urine 1.022 1.005 - 1.030   pH 6.5 5.0 - 8.0   Glucose, UA NEGATIVE NEGATIVE mg/dL   Hgb urine dipstick NEGATIVE NEGATIVE   Bilirubin Urine NEGATIVE NEGATIVE   Ketones, ur NEGATIVE NEGATIVE mg/dL   Protein, ur NEGATIVE NEGATIVE mg/dL   Urobilinogen, UA 0.2 0.0 - 1.0 mg/dL   Nitrite NEGATIVE NEGATIVE   Leukocytes, UA NEGATIVE NEGATIVE    Comment: MICROSCOPIC NOT DONE ON URINES WITH NEGATIVE PROTEIN, BLOOD, LEUKOCYTES, NITRITE, OR GLUCOSE <1000 mg/dL.  APTT     Status: None   Collection Time: 04/28/15 10:15 AM  Result Value Ref Range   aPTT 34 24 - 37 seconds  CBC     Status: None   Collection Time: 04/28/15 10:15 AM  Result Value Ref Range    WBC 5.8 4.0 - 10.5 K/uL   RBC 5.13 4.22 - 5.81 MIL/uL   Hemoglobin  14.8 13.0 - 17.0 g/dL   HCT 45.8 39.0 - 52.0 %   MCV 89.3 78.0 - 100.0 fL   MCH 28.8 26.0 - 34.0 pg   MCHC 32.3 30.0 - 36.0 g/dL   RDW 12.9 11.5 - 15.5 %   Platelets 264 150 - 400 K/uL  Comprehensive metabolic panel     Status: Abnormal   Collection Time: 04/28/15 10:15 AM  Result Value Ref Range   Sodium 143 135 - 145 mmol/L   Potassium 4.5 3.5 - 5.1 mmol/L   Chloride 106 101 - 111 mmol/L   CO2 27 22 - 32 mmol/L   Glucose, Bld 104 (H) 65 - 99 mg/dL   BUN 20 6 - 20 mg/dL   Creatinine, Ser 0.93 0.61 - 1.24 mg/dL   Calcium 10.0 8.9 - 10.3 mg/dL   Total Protein 7.8 6.5 - 8.1 g/dL   Albumin 4.3 3.5 - 5.0 g/dL   AST 16 15 - 41 U/L   ALT 24 17 - 63 U/L   Alkaline Phosphatase 45 38 - 126 U/L   Total Bilirubin 0.8 0.3 - 1.2 mg/dL   GFR calc non Af Amer >60 >60 mL/min   GFR calc Af Amer >60 >60 mL/min    Comment: (NOTE) The eGFR has been calculated using the CKD EPI equation. This calculation has not been validated in all clinical situations. eGFR's persistently <60 mL/min signify possible Chronic Kidney Disease.    Anion gap 10 5 - 15  Protime-INR     Status: None   Collection Time: 04/28/15 10:15 AM  Result Value Ref Range   Prothrombin Time 12.8 11.6 - 15.2 seconds   INR 0.95 0.00 - 1.49  Type and screen     Status: None   Collection Time: 04/28/15 10:15 AM  Result Value Ref Range   ABO/RH(D) O NEG    Antibody Screen NEG    Sample Expiration 05/07/2015   ABO/Rh     Status: None   Collection Time: 04/28/15 10:15 AM  Result Value Ref Range   ABO/RH(D) O NEG   Glucose, capillary     Status: Abnormal   Collection Time: 05/27/15 10:31 AM  Result Value Ref Range   Glucose-Capillary 112 (H) 65 - 99 mg/dL   Comment 1 Notify RN   Type and screen     Status: None   Collection Time: 05/27/15 10:55 AM  Result Value Ref Range   ABO/RH(D) O NEG    Antibody Screen NEG    Sample Expiration 05/30/2015   Glucose,  capillary     Status: Abnormal   Collection Time: 05/27/15  2:48 PM  Result Value Ref Range   Glucose-Capillary 109 (H) 65 - 99 mg/dL  Glucose, capillary     Status: Abnormal   Collection Time: 05/27/15  4:55 PM  Result Value Ref Range   Glucose-Capillary 129 (H) 65 - 99 mg/dL  Glucose, capillary     Status: Abnormal   Collection Time: 05/27/15 10:02 PM  Result Value Ref Range   Glucose-Capillary 202 (H) 65 - 99 mg/dL   Comment 1 Notify RN   CBC     Status: Abnormal   Collection Time: 05/28/15  4:10 AM  Result Value Ref Range   WBC 10.7 (H) 4.0 - 10.5 K/uL   RBC 4.44 4.22 - 5.81 MIL/uL   Hemoglobin 12.7 (L) 13.0 - 17.0 g/dL   HCT 39.0 39.0 - 52.0 %   MCV 87.8 78.0 - 100.0 fL   MCH  28.6 26.0 - 34.0 pg   MCHC 32.6 30.0 - 36.0 g/dL   RDW 12.8 11.5 - 15.5 %   Platelets 247 150 - 400 K/uL  Basic metabolic panel     Status: Abnormal   Collection Time: 05/28/15  4:10 AM  Result Value Ref Range   Sodium 140 135 - 145 mmol/L   Potassium 4.1 3.5 - 5.1 mmol/L   Chloride 106 101 - 111 mmol/L   CO2 26 22 - 32 mmol/L   Glucose, Bld 194 (H) 65 - 99 mg/dL   BUN 18 6 - 20 mg/dL   Creatinine, Ser 1.10 0.61 - 1.24 mg/dL   Calcium 9.1 8.9 - 10.3 mg/dL   GFR calc non Af Amer >60 >60 mL/min   GFR calc Af Amer >60 >60 mL/min    Comment: (NOTE) The eGFR has been calculated using the CKD EPI equation. This calculation has not been validated in all clinical situations. eGFR's persistently <60 mL/min signify possible Chronic Kidney Disease.    Anion gap 8 5 - 15  Glucose, capillary     Status: Abnormal   Collection Time: 05/28/15  6:55 AM  Result Value Ref Range   Glucose-Capillary 149 (H) 65 - 99 mg/dL   Comment 1 Notify RN   Glucose, capillary     Status: Abnormal   Collection Time: 05/28/15 11:29 AM  Result Value Ref Range   Glucose-Capillary 159 (H) 65 - 99 mg/dL  Glucose, capillary     Status: Abnormal   Collection Time: 05/28/15  5:14 PM  Result Value Ref Range   Glucose-Capillary  119 (H) 65 - 99 mg/dL  Glucose, capillary     Status: Abnormal   Collection Time: 05/28/15  9:58 PM  Result Value Ref Range   Glucose-Capillary 175 (H) 65 - 99 mg/dL  CBC     Status: Abnormal   Collection Time: 05/29/15  4:15 AM  Result Value Ref Range   WBC 8.9 4.0 - 10.5 K/uL   RBC 4.25 4.22 - 5.81 MIL/uL   Hemoglobin 11.9 (L) 13.0 - 17.0 g/dL   HCT 36.7 (L) 39.0 - 52.0 %   MCV 86.4 78.0 - 100.0 fL   MCH 28.0 26.0 - 34.0 pg   MCHC 32.4 30.0 - 36.0 g/dL   RDW 13.2 11.5 - 15.5 %   Platelets 226 150 - 400 K/uL  Basic metabolic panel     Status: Abnormal   Collection Time: 05/29/15  4:15 AM  Result Value Ref Range   Sodium 140 135 - 145 mmol/L   Potassium 3.5 3.5 - 5.1 mmol/L   Chloride 109 101 - 111 mmol/L   CO2 23 22 - 32 mmol/L   Glucose, Bld 140 (H) 65 - 99 mg/dL   BUN 17 6 - 20 mg/dL   Creatinine, Ser 0.92 0.61 - 1.24 mg/dL   Calcium 8.8 (L) 8.9 - 10.3 mg/dL   GFR calc non Af Amer >60 >60 mL/min   GFR calc Af Amer >60 >60 mL/min    Comment: (NOTE) The eGFR has been calculated using the CKD EPI equation. This calculation has not been validated in all clinical situations. eGFR's persistently <60 mL/min signify possible Chronic Kidney Disease.    Anion gap 8 5 - 15  Glucose, capillary     Status: Abnormal   Collection Time: 05/29/15  7:04 AM  Result Value Ref Range   Glucose-Capillary 145 (H) 65 - 99 mg/dL  Urinalysis, Routine w reflex microscopic (not at [facility])  Status: Abnormal   Collection Time: 06/13/15  4:21 PM  Result Value Ref Range   Color, Urine YELLOW YELLOW   APPearance CLOUDY (A) CLEAR   Specific Gravity, Urine 1.024 1.005 - 1.030   pH 6.0 5.0 - 8.0   Glucose, UA NEGATIVE NEGATIVE mg/dL   Hgb urine dipstick SMALL (A) NEGATIVE   Bilirubin Urine NEGATIVE NEGATIVE   Ketones, ur NEGATIVE NEGATIVE mg/dL   Protein, ur 30 (A) NEGATIVE mg/dL   Urobilinogen, UA 1.0 0.0 - 1.0 mg/dL   Nitrite NEGATIVE NEGATIVE   Leukocytes, UA LARGE (A) NEGATIVE  Urine  microscopic-add on     Status: Abnormal   Collection Time: 06/13/15  4:21 PM  Result Value Ref Range   Squamous Epithelial / LPF RARE RARE   WBC, UA TOO NUMEROUS TO COUNT <3 WBC/hpf   RBC / HPF 7-10 <3 RBC/hpf   Bacteria, UA MANY (A) RARE   Casts HYALINE CASTS (A) NEGATIVE  Urine culture     Status: None   Collection Time: 06/13/15  4:21 PM  Result Value Ref Range   Specimen Description URINE, CLEAN CATCH    Special Requests NONE    Culture      >=100,000 COLONIES/mL PROTEUS MIRABILIS Performed at [facility]    Report Status 06/16/2015 FINAL    Organism ID, Bacteria PROTEUS MIRABILIS       Susceptibility   Proteus mirabilis - MIC*    AMPICILLIN <=2 SENSITIVE Sensitive     CEFAZOLIN <=4 SENSITIVE Sensitive     CEFTRIAXONE <=1 SENSITIVE Sensitive     CIPROFLOXACIN <=0.25 SENSITIVE Sensitive     GENTAMICIN <=1 SENSITIVE Sensitive     IMIPENEM 2 SENSITIVE Sensitive     NITROFURANTOIN 128 RESISTANT Resistant     TRIMETH/SULFA <=20 SENSITIVE Sensitive     AMPICILLIN/SULBACTAM <=2 SENSITIVE Sensitive     PIP/TAZO <=4 SENSITIVE Sensitive     * >=100,000 COLONIES/mL PROTEUS MIRABILIS  POCT urinalysis dipstick     Status: Abnormal   Collection Time: 06/19/15  2:47 PM  Result Value Ref Range   Color, UA yellow    Clarity, UA clear    Glucose, UA neg    Bilirubin, UA neg    Ketones, UA neg    Spec Grav, UA 1.020    Blood, UA neg    pH, UA 6.0    Protein, UA neg    Urobilinogen, UA 0.2    Nitrite, UA neg    Leukocytes, UA small (1+) (A) Negative  POCT urinalysis dipstick     Status: Abnormal   Collection Time: 06/29/15 11:40 AM  Result Value Ref Range   Color, UA light-yellow    Clarity, UA clear    Glucose, UA neg    Bilirubin, UA neg    Ketones, UA neg    Spec Grav, UA 1.015    Blood, UA trace    pH, UA 6.5    Protein, UA neg    Urobilinogen, UA 0.2    Nitrite, UA neg    Leukocytes, UA Trace (A) Negative    Assessment/Plan: UTI symptoms Urine dip + LE  and blood. Will send for culture but begin ABX. Previous culture sensitivity reviewed. Will begin Cipro 500 mg BID x 10 days. Will alter based on culture results. Increase fluids. Probiotic. Follow-up if symptoms are not resolving.

## 2015-07-02 DIAGNOSIS — Z96651 Presence of right artificial knee joint: Secondary | ICD-10-CM | POA: Diagnosis not present

## 2015-07-02 DIAGNOSIS — Z471 Aftercare following joint replacement surgery: Secondary | ICD-10-CM | POA: Diagnosis not present

## 2015-07-02 LAB — URINE CULTURE: Colony Count: 70000

## 2015-07-03 ENCOUNTER — Ambulatory Visit: Payer: Medicare Other | Admitting: Physical Therapy

## 2015-07-03 DIAGNOSIS — R262 Difficulty in walking, not elsewhere classified: Secondary | ICD-10-CM

## 2015-07-03 DIAGNOSIS — M25561 Pain in right knee: Secondary | ICD-10-CM | POA: Diagnosis not present

## 2015-07-03 DIAGNOSIS — R269 Unspecified abnormalities of gait and mobility: Secondary | ICD-10-CM | POA: Diagnosis not present

## 2015-07-03 DIAGNOSIS — M25661 Stiffness of right knee, not elsewhere classified: Secondary | ICD-10-CM

## 2015-07-03 NOTE — Therapy (Signed)
[facility] [address]  [place], [place], [address] Phone: [phone]   Fax:  [phone]  Physical Therapy Treatment  Patient Details  Name: Matthew Nash MRN: [id number] Date of Birth: Apr 18, 1936 Referring Provider:  Gaynelle Arabian, MD  Encounter Date: 07/03/2015      PT End of Session - 07/03/15 1108    Visit Number 8   Number of Visits 8   Date for PT Re-Evaluation 07/07/15   PT Start Time 1104   PT Stop Time 1145   PT Time Calculation (min) 41 min      Past Medical History  Diagnosis Date  . BPH (benign prostatic hyperplasia)     hx s/p turp  . Hyperlipidemia     takes Simvastatin and Tricor daily-under control  . Joint pain     both knees  . Joint swelling     rt knee  . Chronic back pain     spondylosis  . Melanoma     right hand;basal cell carcinoma  . H/O hiatal hernia   . History of colon polyps   . Diverticulitis   . Neuropathy   . Prostate infection     currently taking macrobid  . Diarrhea     x 1 today  . History of chicken pox   . Urinary incontinence   . UTI (lower urinary tract infection)   . Back pain at L4-L5 level   . Arthritis     both knees  . DDD (degenerative disc disease), lumbar   . History of kidney stones   . Diabetes mellitus     takes Metformin and Victoza daily-under control  . Hepatitis     B-with Mono and jaundice 1960  . GERD (gastroesophageal reflux disease)     takes Omeprazole daily-under control  . Hypertension     takes Lisinopril daily-under control  . H/O measles   . Pneumonia     hx of  . Tinnitus     Past Surgical History  Procedure Laterality Date  . Shoulder arthroscopy w/ rotator cuff repair Left NOV 2010  . Transurethral resection of prostate  2006  . Cortisone injection    . I&d left knee Left 1958    states 22 times  . C5 and t1 bone chip removed   1986  . Cholecystectomy  1993  . Right shoulder surgery  2012    rotator  cuff repair  . Colonoscopy  2011  . Lumbar laminectomy/decompression microdiscectomy Right 02/17/2014    Procedure: RIGHT LUMBAR TWO-THREE LAMINECTOMY;  Surgeon: Charlie Pitter, MD;  Location: [facility];  Service: Neurosurgery;  Laterality: Right;  right   . Knee arthroscopy Right 07/16/2014    Procedure: RIGHT ARTHROSCOPY KNEE WITH Medial and Lateral DEBRIDEMENT and chondroplasty;  Surgeon: Gearlean Alf, MD;  Location: [facility];  Service: Orthopedics;  Laterality: Right;  . Wisdom tooth extraction    . Basal cell carcinoma excision    . Vasectomy  1983  . Tonsillectomy  1942  . Cauterize inner nose  1957  . Total knee arthroplasty Right 05/27/2015    Procedure: RIGHT TOTAL KNEE ARTHROPLASTY;  Surgeon: Gaynelle Arabian, MD;  Location: [facility];  Service: Orthopedics;  Laterality: Right;    There were no vitals filed for this visit.  Visit Diagnosis:  Difficulty walking  Knee stiffness, right  Abnormality of gait      Subjective Assessment - 07/03/15 1106    Subjective  states feels comfortable with HEP and progressing knee independently.  Denies "discomfort" with stretching knee but otherwise states is pain-free.  States uses SPC while away from home but only requires indoors when wakes up at night.   Currently in Pain? No/denies            [facility] PT Assessment - 07-21-2015 0001    AROM   Overall AROM Comments R Knee 9-114   PROM   Overall PROM Comments R Knee 4-120         TODAY'S TREATMENT TherEx - Rec Bike lvl 2, 5' 8"  Step-up with SPC and CGA 8x (pt fearful due to ht of step) 6" lateral step-up/down 8x each with RW in front of pt for A Sit->stand from chair with increasing R knee flexion and L knee extension.  Able to perform with 1/2 stagger but unable to perform without UE assist with L heel at or past R toes. Manual - R Knee Flexion and Extension mobes grade 3 Knee ROM assessment Bridge 10x Bridge with March 5x Gait in halls and up/down stairs - requires SPC A and  railing with stairs but is able to perform reciprocal gait up and down (limited eccentric control descending requiring heavy rail use).             PT Education - July 21, 2015 1147    Education provided Yes   Education Details Reviewed current HEP and added chair squats   Person(s) Educated Patient   Methods Explanation;Demonstration   Comprehension Verbalized understanding;Returned demonstration             PT Long Term Goals - July 21, 2015 1134    PT LONG TERM GOAL #1   Title R Knee AROM 5-120 or better   Status Not Met   PT LONG TERM GOAL #2   Title pt able to ambulate with SPC over level and uneven terrain with good mechanics and distances not limited by pain  improved mechanics but still slow and mild antalgic on R with decreased R knee ROM throughout gait cycle.   Status Partially Met               Plan - 07/21/2015 1157    Clinical Impression Statement pt with good progress since TKA surgery on 05/27/15.  He has progressed to ambulating with [facility] which was his prior level of function and he is pleased with this. His R Knee AROM today was 9-114 and PROM 4-120.  He is able to ascend/descend stairs with reciprocal gait but requires SPC and single rail use to do so (pt lives in single story home).  He is independent with HEP and states feels comfortable with progressing independently going forward.   PT Next Visit Plan discharge   Consulted and Agree with Plan of Care Patient          G-Codes - 07/21/2015 1201-02-22    Functional Assessment Tool Used foto 42% limitation   Functional Limitation Mobility: Walking and moving around   Mobility: Walking and Moving Around Current Status [phone]) At least 40 percent but less than 60 percent impaired, limited or restricted   Mobility: Walking and Moving Around Goal Status [phone]) At least 40 percent but less than 60 percent impaired, limited or restricted   Mobility: Walking and Moving Around Discharge Status [phone]) At least 40  percent but less than 60 percent impaired, limited or restricted      Problem List Patient Active Problem List   Diagnosis Date Noted  . UTI  symptoms 06/30/2015  . Urinary tract infectious disease 06/20/2015  . OA (osteoarthritis) of knee 05/27/2015  . Diabetes mellitus type II, controlled 11/26/2014  . Gastroesophageal reflux disease without esophagitis 11/26/2014  . Arthritis of both knees 11/26/2014  . Absence of bladder continence 11/26/2014  . Hyperlipidemia LDL goal <100 11/26/2014  . Acute medial meniscal tear 07/16/2014  . Lumbosacral spondylosis without myelopathy 02/17/2014  . Spondylosis, lumbosacral 02/17/2014    Akela Pocius PT, OCS 07/03/2015, 12:03 PM  [facility] [address]  [place], [place], [address] Phone: [phone]   Fax:  [phone]   PHYSICAL THERAPY DISCHARGE SUMMARY  Visits from Start of Care: 8  Current functional level related to goals / functional outcomes: 42% limitation per FOTO   Remaining deficits: R Knee lacking TKE, impaired gait mechanics   Education / Equipment: HEP Plan: Patient agrees to discharge.  Patient goals were partially met. Patient is being discharged due to being pleased with the current functional level.  ?????         Leonette Most PT, OCS 07/03/2015 12:04 PM

## 2015-07-05 ENCOUNTER — Encounter: Payer: Self-pay | Admitting: Physician Assistant

## 2015-07-05 MED ORDER — GLUCOSE BLOOD VI STRP: ORAL_STRIP | Status: DC

## 2015-07-07 ENCOUNTER — Ambulatory Visit: Payer: Medicare Other | Admitting: Physical Therapy

## 2015-07-09 ENCOUNTER — Encounter: Payer: Self-pay | Admitting: Physician Assistant

## 2015-07-09 MED ORDER — GLUCOSE BLOOD VI STRP: ORAL_STRIP | Status: DC

## 2015-07-10 ENCOUNTER — Other Ambulatory Visit: Payer: Self-pay | Admitting: Physician Assistant

## 2015-07-10 MED ORDER — GLUCOSE BLOOD VI STRP: ORAL_STRIP | Status: DC

## 2015-08-16 ENCOUNTER — Encounter: Payer: Self-pay | Admitting: Physician Assistant

## 2015-08-17 MED ORDER — SIMVASTATIN 10 MG PO TABS: 10.0000 mg | ORAL_TABLET | Freq: Every day | ORAL | Status: DC

## 2015-08-20 ENCOUNTER — Ambulatory Visit (INDEPENDENT_AMBULATORY_CARE_PROVIDER_SITE_OTHER): Payer: Medicare Other

## 2015-08-20 DIAGNOSIS — Z23 Encounter for immunization: Secondary | ICD-10-CM

## 2015-08-21 DIAGNOSIS — H18411 Arcus senilis, right eye: Secondary | ICD-10-CM | POA: Diagnosis not present

## 2015-08-21 DIAGNOSIS — Z96651 Presence of right artificial knee joint: Secondary | ICD-10-CM | POA: Diagnosis not present

## 2015-08-21 DIAGNOSIS — H2512 Age-related nuclear cataract, left eye: Secondary | ICD-10-CM | POA: Diagnosis not present

## 2015-08-21 DIAGNOSIS — H2511 Age-related nuclear cataract, right eye: Secondary | ICD-10-CM | POA: Diagnosis not present

## 2015-08-21 DIAGNOSIS — H18412 Arcus senilis, left eye: Secondary | ICD-10-CM | POA: Diagnosis not present

## 2015-08-21 DIAGNOSIS — H35372 Puckering of macula, left eye: Secondary | ICD-10-CM | POA: Diagnosis not present

## 2015-08-21 DIAGNOSIS — Z471 Aftercare following joint replacement surgery: Secondary | ICD-10-CM | POA: Diagnosis not present

## 2015-08-23 ENCOUNTER — Emergency Department (HOSPITAL_BASED_OUTPATIENT_CLINIC_OR_DEPARTMENT_OTHER)
Admission: EM | Admit: 2015-08-23 | Discharge: 2015-08-23 | Disposition: A | Discharge status: Home / Self Care | Payer: Medicare Other | Attending: Emergency Medicine | Admitting: Emergency Medicine

## 2015-08-23 ENCOUNTER — Emergency Department (HOSPITAL_BASED_OUTPATIENT_CLINIC_OR_DEPARTMENT_OTHER): Payer: Medicare Other

## 2015-08-23 ENCOUNTER — Encounter (HOSPITAL_BASED_OUTPATIENT_CLINIC_OR_DEPARTMENT_OTHER): Payer: Self-pay

## 2015-08-23 DIAGNOSIS — N400 Enlarged prostate without lower urinary tract symptoms: Secondary | ICD-10-CM | POA: Insufficient documentation

## 2015-08-23 DIAGNOSIS — Z79899 Other long term (current) drug therapy: Secondary | ICD-10-CM | POA: Diagnosis not present

## 2015-08-23 DIAGNOSIS — Z85820 Personal history of malignant melanoma of skin: Secondary | ICD-10-CM | POA: Insufficient documentation

## 2015-08-23 DIAGNOSIS — K5732 Diverticulitis of large intestine without perforation or abscess without bleeding: Secondary | ICD-10-CM | POA: Diagnosis not present

## 2015-08-23 DIAGNOSIS — E119 Type 2 diabetes mellitus without complications: Secondary | ICD-10-CM | POA: Diagnosis not present

## 2015-08-23 DIAGNOSIS — K219 Gastro-esophageal reflux disease without esophagitis: Secondary | ICD-10-CM | POA: Insufficient documentation

## 2015-08-23 DIAGNOSIS — Z87442 Personal history of urinary calculi: Secondary | ICD-10-CM | POA: Diagnosis not present

## 2015-08-23 DIAGNOSIS — K5730 Diverticulosis of large intestine without perforation or abscess without bleeding: Secondary | ICD-10-CM | POA: Diagnosis not present

## 2015-08-23 DIAGNOSIS — Z880 Allergy status to penicillin: Secondary | ICD-10-CM | POA: Diagnosis not present

## 2015-08-23 DIAGNOSIS — R1032 Left lower quadrant pain: Secondary | ICD-10-CM | POA: Diagnosis present

## 2015-08-23 DIAGNOSIS — Z86010 Personal history of colonic polyps: Secondary | ICD-10-CM | POA: Insufficient documentation

## 2015-08-23 DIAGNOSIS — I10 Essential (primary) hypertension: Secondary | ICD-10-CM | POA: Insufficient documentation

## 2015-08-23 DIAGNOSIS — Z8669 Personal history of other diseases of the nervous system and sense organs: Secondary | ICD-10-CM | POA: Insufficient documentation

## 2015-08-23 DIAGNOSIS — Z87440 Personal history of urinary (tract) infections: Secondary | ICD-10-CM | POA: Diagnosis not present

## 2015-08-23 DIAGNOSIS — G8929 Other chronic pain: Secondary | ICD-10-CM | POA: Insufficient documentation

## 2015-08-23 DIAGNOSIS — Z8739 Personal history of other diseases of the musculoskeletal system and connective tissue: Secondary | ICD-10-CM | POA: Insufficient documentation

## 2015-08-23 DIAGNOSIS — Z8619 Personal history of other infectious and parasitic diseases: Secondary | ICD-10-CM | POA: Diagnosis not present

## 2015-08-23 DIAGNOSIS — Z8701 Personal history of pneumonia (recurrent): Secondary | ICD-10-CM | POA: Insufficient documentation

## 2015-08-23 DIAGNOSIS — Z794 Long term (current) use of insulin: Secondary | ICD-10-CM | POA: Insufficient documentation

## 2015-08-23 DIAGNOSIS — E785 Hyperlipidemia, unspecified: Secondary | ICD-10-CM | POA: Diagnosis not present

## 2015-08-23 DIAGNOSIS — Z87891 Personal history of nicotine dependence: Secondary | ICD-10-CM | POA: Diagnosis not present

## 2015-08-23 LAB — COMPREHENSIVE METABOLIC PANEL
ALK PHOS: 45 U/L (ref 38–126)
ALT: 15 U/L — ABNORMAL LOW (ref 17–63)
ANION GAP: 8 (ref 5–15)
AST: 13 U/L — ABNORMAL LOW (ref 15–41)
Albumin: 3.7 g/dL (ref 3.5–5.0)
BUN: 15 mg/dL (ref 6–20)
CALCIUM: 9.4 mg/dL (ref 8.9–10.3)
CO2: 25 mmol/L (ref 22–32)
Chloride: 108 mmol/L (ref 101–111)
Creatinine, Ser: 1.06 mg/dL (ref 0.61–1.24)
Glucose, Bld: 142 mg/dL — ABNORMAL HIGH (ref 65–99)
Potassium: 3.8 mmol/L (ref 3.5–5.1)
SODIUM: 141 mmol/L (ref 135–145)
Total Bilirubin: 0.6 mg/dL (ref 0.3–1.2)
Total Protein: 6.7 g/dL (ref 6.5–8.1)

## 2015-08-23 LAB — URINALYSIS, ROUTINE W REFLEX MICROSCOPIC
BILIRUBIN URINE: NEGATIVE
Glucose, UA: NEGATIVE mg/dL
Hgb urine dipstick: NEGATIVE
KETONES UR: NEGATIVE mg/dL
NITRITE: NEGATIVE
PROTEIN: NEGATIVE mg/dL
SPECIFIC GRAVITY, URINE: 1.042 — AB (ref 1.005–1.030)
UROBILINOGEN UA: 1 mg/dL (ref 0.0–1.0)
pH: 7 (ref 5.0–8.0)

## 2015-08-23 LAB — CBC WITH DIFFERENTIAL/PLATELET
Basophils Absolute: 0 10*3/uL (ref 0.0–0.1)
Basophils Relative: 1 % (ref 0–1)
EOS ABS: 0.1 10*3/uL (ref 0.0–0.7)
EOS PCT: 1 % (ref 0–5)
HCT: 38.3 % — ABNORMAL LOW (ref 39.0–52.0)
HEMOGLOBIN: 12.1 g/dL — AB (ref 13.0–17.0)
LYMPHS ABS: 1.5 10*3/uL (ref 0.7–4.0)
Lymphocytes Relative: 24 % (ref 12–46)
MCH: 27.4 pg (ref 26.0–34.0)
MCHC: 31.6 g/dL (ref 30.0–36.0)
MCV: 86.8 fL (ref 78.0–100.0)
MONOS PCT: 10 % (ref 3–12)
Monocytes Absolute: 0.6 10*3/uL (ref 0.1–1.0)
Neutro Abs: 4.1 10*3/uL (ref 1.7–7.7)
Neutrophils Relative %: 64 % (ref 43–77)
PLATELETS: 259 10*3/uL (ref 150–400)
RBC: 4.41 MIL/uL (ref 4.22–5.81)
RDW: 13.9 % (ref 11.5–15.5)
WBC: 6.3 10*3/uL (ref 4.0–10.5)

## 2015-08-23 LAB — URINE MICROSCOPIC-ADD ON

## 2015-08-23 MED ORDER — MORPHINE SULFATE (PF) 4 MG/ML IV SOLN
4.0000 mg | INTRAVENOUS | Status: DC | PRN
Start: 2015-08-23 — End: 2015-08-23

## 2015-08-23 MED ORDER — METRONIDAZOLE 500 MG PO TABS: 500.0000 mg | ORAL_TABLET | Freq: Two times a day (BID) | ORAL | Status: DC

## 2015-08-23 MED ORDER — SODIUM CHLORIDE 0.9 % IV BOLUS (SEPSIS)
1000.0000 mL | Freq: Once | INTRAVENOUS | Status: AC
  Administered 2015-08-23: 1000 mL via INTRAVENOUS

## 2015-08-23 MED ORDER — IOHEXOL 300 MG/ML  SOLN
100.0000 mL | Freq: Once | INTRAMUSCULAR | Status: AC | PRN
  Administered 2015-08-23: 100 mL via INTRAVENOUS

## 2015-08-23 MED ORDER — CIPROFLOXACIN HCL 500 MG PO TABS: 500.0000 mg | ORAL_TABLET | Freq: Two times a day (BID) | ORAL | Status: DC

## 2015-08-23 MED ORDER — ONDANSETRON 8 MG PO TBDP: 8.0000 mg | ORAL_TABLET | Freq: Three times a day (TID) | ORAL | Status: DC | PRN

## 2015-08-23 MED ORDER — HYDROCODONE-ACETAMINOPHEN 5-325 MG PO TABS: 1.0000 | ORAL_TABLET | ORAL | Status: DC | PRN

## 2015-08-23 MED ORDER — IBUPROFEN 600 MG PO TABS: 600.0000 mg | ORAL_TABLET | Freq: Three times a day (TID) | ORAL | Status: DC | PRN

## 2015-08-23 MED ORDER — IOHEXOL 300 MG/ML  SOLN
50.0000 mL | Freq: Once | INTRAMUSCULAR | Status: AC | PRN
  Administered 2015-08-23: 50 mL via ORAL

## 2015-08-23 MED ORDER — METRONIDAZOLE 500 MG PO TABS
500.0000 mg | ORAL_TABLET | Freq: Once | ORAL | Status: AC
  Administered 2015-08-23: 500 mg via ORAL
  Filled 2015-08-23: qty 1

## 2015-08-23 MED ORDER — CIPROFLOXACIN HCL 500 MG PO TABS
500.0000 mg | ORAL_TABLET | Freq: Once | ORAL | Status: AC
  Administered 2015-08-23: 500 mg via ORAL
  Filled 2015-08-23: qty 1

## 2015-08-23 NOTE — ED Notes (Signed)
MD at bedside. 

## 2015-08-23 NOTE — ED Notes (Signed)
Pt given snack with medications

## 2015-08-23 NOTE — ED Provider Notes (Signed)
CSN: [id number]     Arrival date & time 08/23/15  1018 History   First MD Initiated Contact with Patient 08/23/15 1036     Chief Complaint  Patient presents with  . Abdominal Pain     (Consider location/radiation/quality/duration/timing/severity/associated sxs/prior Treatment) HPI Patient presents to emergency department with ongoing left lower quadrant abdominal pain of the past days.  He has a history of diverticulitis and feels like this might be another flare of his diverticulitis.  Denies fevers and chills.  Reports nausea without vomiting.  Denies diarrhea.  Patient's pain is moderate in severity.  It is worse with movement and palpation of his left lower quadrant.  He does not want any medication for discomfort or pain at this time.  His bowels movements have been normal.  He reports urinary frequency without dysuria.  He feels like this is a urologic issue and he is status post TURP.  He is scheduled to see his urologist regarding his urinary frequency this week.    Past Medical History  Diagnosis Date  . BPH (benign prostatic hyperplasia)     hx s/p turp  . Hyperlipidemia     takes Simvastatin and Tricor daily-under control  . Joint pain     both knees  . Joint swelling     rt knee  . Chronic back pain     spondylosis  . Melanoma     right hand;basal cell carcinoma  . H/O hiatal hernia   . History of colon polyps   . Diverticulitis   . Neuropathy   . Prostate infection     currently taking macrobid  . Diarrhea     x 1 today  . History of chicken pox   . Urinary incontinence   . UTI (lower urinary tract infection)   . Back pain at L4-L5 level   . Arthritis     both knees  . DDD (degenerative disc disease), lumbar   . History of kidney stones   . Diabetes mellitus     takes Metformin and Victoza daily-under control  . Hepatitis     B-with Mono and jaundice 1960  . GERD (gastroesophageal reflux disease)     takes Omeprazole daily-under control  . Hypertension      takes Lisinopril daily-under control  . H/O measles   . Pneumonia     hx of  . Tinnitus    Past Surgical History  Procedure Laterality Date  . Shoulder arthroscopy w/ rotator cuff repair Left NOV 2010  . Transurethral resection of prostate  2006  . Cortisone injection    . I&d left knee Left 1958    states 22 times  . C5 and t1 bone chip removed   1986  . Cholecystectomy  1993  . Right shoulder surgery  2012    rotator cuff repair  . Colonoscopy  2011  . Lumbar laminectomy/decompression microdiscectomy Right 02/17/2014    Procedure: RIGHT LUMBAR TWO-THREE LAMINECTOMY;  Surgeon: Charlie Pitter, MD;  Location: [facility];  Service: Neurosurgery;  Laterality: Right;  right   . Knee arthroscopy Right 07/16/2014    Procedure: RIGHT ARTHROSCOPY KNEE WITH Medial and Lateral DEBRIDEMENT and chondroplasty;  Surgeon: Gearlean Alf, MD;  Location: [facility];  Service: Orthopedics;  Laterality: Right;  . Wisdom tooth extraction    . Basal cell carcinoma excision    . Vasectomy  1983  . Tonsillectomy  1942  . Cauterize inner nose  1957  . Total knee arthroplasty  Right 05/27/2015    Procedure: RIGHT TOTAL KNEE ARTHROPLASTY;  Surgeon: Gaynelle Arabian, MD;  Location: [facility];  Service: Orthopedics;  Laterality: Right;   Family History  Problem Relation Age of Onset  . Heart disease Mother 60    Deceased  . Bladder Cancer Mother   . Prostate cancer Father     Deceased-in 25s  . Healthy Brother   . Skin cancer Brother     #2  . Asthma Sister     Deceased  . Healthy Son     #1  . Healthy Daughter     #2  . Obesity Son     #2   Social History  Substance Use Topics  . Smoking status: Former Smoker    Types: Pipe    Quit date: 07/14/1978  . Smokeless tobacco: Never Used     Comment: quit smoking 1979  . Alcohol Use: Yes     Comment: OCCASIONAL    Review of Systems  All other systems reviewed and are negative.     Allergies  Albamycin; Shrimp; and Penicillins  Home  Medications   Prior to Admission medications   Medication Sig Start Date End Date Taking? Authorizing Provider         fenofibrate (TRICOR) 145 MG tablet Take 1 tablet (145 mg total) by mouth every evening. 05/26/15   Brunetta Jeans, PA-C  glucose blood (FREESTYLE LITE) test strip Use as instructed 07/10/15   Brunetta Jeans, PA-C                Insulin Pen Needle (CAREFINE PEN NEEDLES) 31G X 8 MM MISC Use once daily with Victoza injections. 05/12/15   Brunetta Jeans, PA-C  Lancets (FREESTYLE) lancets 1 each by Other route 2 (two) times daily. Use as instructed 11/25/14   Brunetta Jeans, PA-C  Liraglutide 18 MG/3ML SOPN Inject 0.3 mLs (1.8 mg total) into the skin daily. 03/23/15   Brunetta Jeans, PA-C  lisinopril (PRINIVIL,ZESTRIL) 10 MG tablet Take 1 tablet (10 mg total) by mouth every evening. 05/14/15   Brunetta Jeans, PA-C  metFORMIN (GLUCOPHAGE-XR) 500 MG 24 hr tablet Take 1 tablet (500 mg total) by mouth 2 (two) times daily with a meal. 05/26/15   Brunetta Jeans, PA-C         omeprazole (PRILOSEC) 20 MG capsule Take 1 capsule (20 mg total) by mouth daily. 06/15/15   Brunetta Jeans, PA-C         simvastatin (ZOCOR) 10 MG tablet Take 1 tablet (10 mg total) by mouth at bedtime. 08/17/15   Brunetta Jeans, PA-C  tamsulosin (FLOMAX) 0.4 MG CAPS capsule Take 0.4 mg by mouth every evening.     Historical Provider, MD   BP 132/61 mmHg  Pulse 73  Temp(Src) 98.5 F (36.9 C) (Oral)  Resp 18  Wt 220 lb (99.791 kg)  SpO2 98% Physical Exam  Constitutional: He is oriented to person, place, and time. He appears well-developed and well-nourished.  HENT:  Head: Normocephalic and atraumatic.  Eyes: EOM are normal.  Neck: Normal range of motion.  Cardiovascular: Normal rate, regular rhythm, normal heart sounds and intact distal pulses.   Pulmonary/Chest: Effort normal and breath sounds normal. No respiratory distress.  Abdominal: Soft. He exhibits no distension.  Left-sided abdominal  tenderness with tenderness in the left lower quadrant.  No guarding or rebound.  No peritonitis.  Musculoskeletal: Normal range of motion.  Neurological: He is alert and oriented to  person, place, and time.  Skin: Skin is warm and dry.  Psychiatric: He has a normal mood and affect. Judgment normal.  Nursing note and vitals reviewed.   ED Course  Procedures (including critical care time) Labs Review Labs Reviewed  CBC WITH DIFFERENTIAL/PLATELET - Abnormal; Notable for the following:    Hemoglobin 12.1 (*)    HCT 38.3 (*)    All other components within normal limits  COMPREHENSIVE METABOLIC PANEL - Abnormal; Notable for the following:    Glucose, Bld 142 (*)    AST 13 (*)    ALT 15 (*)    All other components within normal limits  URINALYSIS, ROUTINE W REFLEX MICROSCOPIC (NOT AT [facility]) - Abnormal; Notable for the following:    Specific Gravity, Urine 1.042 (*)    Leukocytes, UA MODERATE (*)    All other components within normal limits  URINE MICROSCOPIC-ADD ON - Abnormal; Notable for the following:    Bacteria, UA FEW (*)    All other components within normal limits  URINE CULTURE    Imaging Review Ct Abdomen Pelvis W Contrast  08/23/2015   CLINICAL DATA:  Left lower quadrant abdominal pain for 5 days  EXAM: CT ABDOMEN AND PELVIS WITH CONTRAST  TECHNIQUE: Multidetector CT imaging of the abdomen and pelvis was performed using the standard protocol following bolus administration of intravenous contrast.  CONTRAST:  117mL OMNIPAQUE IOHEXOL 300 MG/ML SOLN, 60mL OMNIPAQUE IOHEXOL 300 MG/ML SOLN  COMPARISON:  04/22/2009 prior report  FINDINGS: Lower chest: Curvilinear subpleural bilateral atelectasis or scarring noted. No pleural effusion.  Hepatobiliary: Liver appears unremarkable. Cholecystectomy clips are noted.  Pancreas: Normal  Spleen: Normal  Adrenals/Urinary Tract: Adrenal glands are normal. Left renal cortical cysts are incidentally noted. 4 mm too small to characterize left upper  renal pole cortical lesion image 36. Mild bilateral renal cortical atrophy. No hydroureteronephrosis. No radiopaque renal, ureteral, or bladder calculus.  Stomach/Bowel: Short segmental descending colonic wall thickening and surrounding stranding surrounding a diverticulum image 64 series 2. No rim enhancing fluid collection to suggest abscess. No free air or fluid. Bowel otherwise unremarkable. Stomach is normal.  Vascular/Lymphatic: Moderate atheromatous aortic calcification without aneurysm. No lymphadenopathy.  Other: Right probable seminal vesicle cyst incidentally noted containing a calcification image 84.  Musculoskeletal: Lumbar spine disc degenerative change. No acute osseous abnormality.  IMPRESSION: Minimal short segmental mid descending colonic wall thickening surrounding a diverticulum with stranding likely indicating diverticulitis without complicating feature.   Electronically Signed   By: Conchita Paris M.D.   On: 08/23/2015 12:30   I have personally reviewed and evaluated these images and lab results as part of my medical decision-making.   EKG Interpretation None      MDM   Final diagnoses:  Diverticulitis of large intestine without perforation or abscess without bleeding    Diverticulitis without acute evidence of complications such as abscess.  Vital signs are normal.  Patient feels better at this time.  Home with antibiotics, nausea medicine, pain medication.  He understands to return to the ER for new or worsening symptoms.    Jola Schmidt, MD 08/23/15 [phone]

## 2015-08-23 NOTE — ED Notes (Addendum)
Patient here with LLQ pain that started on Wednesday. Reports loose stools, no nausea, no vomiting. Thinks he may be having diverticulitis flare-up.

## 2015-08-23 NOTE — ED Notes (Signed)
Rx x 5 given for cipro, flagyl, zofran, ibuprofen, and vicodin. Verbalizes understanding of f/u care

## 2015-08-23 NOTE — Discharge Instructions (Signed)
Diverticulitis °Diverticulitis is inflammation or infection of small pouches in your colon that form when you have a condition called diverticulosis. The pouches in your colon are called diverticula. Your colon, or large intestine, is where water is absorbed and stool is formed. °Complications of diverticulitis can include: °· Bleeding. °· Severe infection. °· Severe pain. °· Perforation of your colon. °· Obstruction of your colon. °CAUSES  °Diverticulitis is caused by bacteria. °Diverticulitis happens when stool becomes trapped in diverticula. This allows bacteria to grow in the diverticula, which can lead to inflammation and infection. °RISK FACTORS °People with diverticulosis are at risk for diverticulitis. Eating a diet that does not include enough fiber from fruits and vegetables may make diverticulitis more likely to develop. °SYMPTOMS  °Symptoms of diverticulitis may include: °· Abdominal pain and tenderness. The pain is normally located on the left side of the abdomen, but may occur in other areas. °· Fever and chills. °· Bloating. °· Cramping. °· Nausea. °· Vomiting. °· Constipation. °· Diarrhea. °· Blood in your stool. °DIAGNOSIS  °Your health care provider will ask you about your medical history and do a physical exam. You may need to have tests done because many medical conditions can cause the same symptoms as diverticulitis. Tests may include: °· Blood tests. °· Urine tests. °· Imaging tests of the abdomen, including X-rays and CT scans. °When your condition is under control, your health care provider may recommend that you have a colonoscopy. A colonoscopy can show how severe your diverticula are and whether something else is causing your symptoms. °TREATMENT  °Most cases of diverticulitis are mild and can be treated at home. Treatment may include: °· Taking over-the-counter pain medicines. °· Following a clear liquid diet. °· Taking antibiotic medicines by mouth for 7-10 days. °More severe cases may  be treated at a hospital. Treatment may include: °· Not eating or drinking. °· Taking prescription pain medicine. °· Receiving antibiotic medicines through an IV tube. °· Receiving fluids and nutrition through an IV tube. °· Surgery. °HOME CARE INSTRUCTIONS  °· Follow your health care provider's instructions carefully. °· Follow a full liquid diet or other diet as directed by your health care provider. After your symptoms improve, your health care provider may tell you to change your diet. He or she may recommend you eat a high-fiber diet. Fruits and vegetables are good sources of fiber. Fiber makes it easier to pass stool. °· Take fiber supplements or probiotics as directed by your health care provider. °· Only take medicines as directed by your health care provider. °· Keep all your follow-up appointments. °SEEK MEDICAL CARE IF:  °· Your pain does not improve. °· You have a hard time eating food. °· Your bowel movements do not return to normal. °SEEK IMMEDIATE MEDICAL CARE IF:  °· Your pain becomes worse. °· Your symptoms do not get better. °· Your symptoms suddenly get worse. °· You have a fever. °· You have repeated vomiting. °· You have bloody or black, tarry stools. °MAKE SURE YOU:  °· Understand these instructions. °· Will watch your condition. °· Will get help right away if you are not doing well or get worse. °Document Released: 09/07/2005 Document Revised: 12/03/2013 Document Reviewed: 10/23/2013 °Exit[employer]® Patient Information ©2015 [employer]. This information is not intended to replace advice given to you by your health care provider. Make sure you discuss any questions you have with your health care provider. ° °Low-Fiber Diet °Fiber is found in fruits, vegetables, and whole grains. A low-fiber   diet restricts fibrous foods that are not digested in the small intestine. A diet containing about 10-15 grams of fiber per day is considered low fiber. Low-fiber diets may be used to: °· Promote healing and  rest the bowel during intestinal flare-ups. °· Prevent blockage of a partially obstructed or narrowed gastrointestinal tract. °· Reduce fecal weight and volume. °· Slow the movement of feces. °You may be on a low-fiber diet as a transitional diet following surgery, after an injury (trauma), or because of a short (acute) or lifelong (chronic) illness. Your health care provider will determine the length of time you need to stay on this diet.  °WHAT DO I NEED TO KNOW ABOUT A LOW-FIBER DIET? °Always check the fiber content on the packaging's Nutrition Facts label, especially on foods from the grains list. Ask your dietitian if you have questions about specific foods that are related to your condition, especially if the food is not listed below. In general, a low-fiber food will have less than 2 g of fiber. °WHAT FOODS CAN I EAT? °Grains °All breads and crackers made with white flour. Sweet rolls, doughnuts, waffles, pancakes, French toast, bagels. Pretzels, Melba toast, zwieback. Well-cooked cereals, such as cornmeal, farina, or cream cereals. Dry cereals that do not contain whole grains, fruit, or nuts, such as refined corn, wheat, rice, and oat cereals. Potatoes prepared any way without skins, plain pastas and noodles, refined white rice. Use white flour for baking and making sauces. Use allowed list of grains for casseroles, dumplings, and puddings.  °Vegetables °Strained tomato and vegetable juices. Fresh lettuce, cucumber, spinach. Well-cooked (no skin or pulp) or canned vegetables, such as asparagus, bean sprouts, beets, carrots, green beans, mushrooms, potatoes, pumpkin, spinach, yellow squash, tomato sauce/puree, turnips, yams, and zucchini. Keep servings limited to ½ cup.  °Fruits °All fruit juices except prune juice. Cooked or canned fruits without skin and seeds, such as applesauce, apricots, cherries, fruit cocktail, grapefruit, grapes, mandarin oranges, melons, peaches, pears, pineapple, and plums. Fresh  fruits without skin, such as apricots, avocados, bananas, melons, pineapple, nectarines, and peaches. Keep servings limited to ½ cup or 1 piece.   °Meat and Other Protein Sources °Ground or well-cooked tender beef, ham, veal, lamb, pork, or poultry. Eggs, plain cheese. Fish, oysters, shrimp, lobster, and other seafood. Liver, organ meats. Smooth nut butters. °Dairy °All milk products and alternative dairy substitutes, such as soy, rice, almond, and coconut, not containing added whole nuts, seeds, or added fruit. °Beverages °Decaf coffee, fruit, and vegetable juices or smoothies (small amounts, with no pulp or skins, and with fruits from allowed list), sports drinks, herbal tea. °Condiments °Ketchup, mustard, vinegar, cream sauce, cheese sauce, cocoa powder. Spices in moderation, such as allspice, basil, bay leaves, celery powder or leaves, cinnamon, cumin powder, curry powder, ginger, mace, marjoram, onion or garlic powder, oregano, paprika, parsley flakes, ground pepper, rosemary, sage, savory, tarragon, thyme, and turmeric. °Sweets and Desserts °Plain cakes and cookies, pie made with allowed fruit, pudding, custard, cream pie. Gelatin, fruit, ice, sherbet, frozen ice pops. Ice cream, ice milk without nuts. Plain hard candy, honey, jelly, molasses, syrup, sugar, chocolate syrup, gumdrops, marshmallows. Limit overall sugar intake.  °Fats and Oil °Margarine, butter, cream, mayonnaise, salad oils, plain salad dressings made from allowed foods. Choose healthy fats such as olive oil, canola oil, and omega-3 fatty acids (such as found in salmon or tuna) when possible.  °Other °Bouillon, broth, or cream soups made from allowed foods. Any strained soup. Casseroles or mixed dishes made with   allowed foods. °The items listed above may not be a complete list of recommended foods or beverages. Contact your dietitian for more options.  °WHAT FOODS ARE NOT RECOMMENDED? °Grains °All whole wheat and whole grain breads and crackers.  Multigrains, rye, bran seeds, nuts, or coconut. Cereals containing whole grains, multigrains, bran, coconut, nuts, raisins. Cooked or dry oatmeal, steel-cut oats. Coarse wheat cereals, granola. Cereals advertised as high fiber. Potato skins. Whole grain pasta, wild or brown rice. Popcorn. Coconut flour. Bran, buckwheat, corn bread, multigrains, rye, wheat germ.  °Vegetables °Fresh, cooked or canned vegetables, such as artichokes, asparagus, beet greens, broccoli, Brussels sprouts, cabbage, celery, cauliflower, corn, eggplant, kale, legumes or beans, okra, peas, and tomatoes. Avoid large servings of any vegetables, especially raw vegetables.  °Fruits °Fresh fruits, such as apples with or without skin, berries, cherries, figs, grapes, grapefruit, guavas, kiwis, mangoes, oranges, papayas, pears, persimmons, pineapple, and pomegranate. Prune juice and juices with pulp, stewed or dried prunes. Dried fruits, dates, raisins. Fruit seeds or skins. Avoid large servings of all fresh fruits. °Meats and Other Protein Sources °Tough, fibrous meats with gristle. Chunky nut butter. Cheese made with seeds, nuts, or other foods not recommended. Nuts, seeds, legumes (beans, including baked beans), dried peas, beans, lentils.  °Dairy °Yogurt or cheese that contains nuts, seeds, or added fruit.  °Beverages °Fruit juices with high pulp, prune juice. Caffeinated coffee and teas.  °Condiments °Coconut, maple syrup, pickles, olives. °Sweets and Desserts °Desserts, cookies, or candies that contain nuts or coconut, chunky peanut butter, dried fruits. Jams, preserves with seeds, marmalade. Large amounts of sugar and sweets. Any other dessert made with fruits from the not recommended list.  °Other °Soups made from vegetables that are not recommended or that contain other foods not recommended.  °The items listed above may not be a complete list of foods and beverages to avoid. Contact your dietitian for more information. °Document Released:  05/20/2002 Document Revised: 12/03/2013 Document Reviewed: 10/21/2013 °ExitCare® Patient Information ©2015 [employer]. This information is not intended to replace advice given to you by your health care provider. Make sure you discuss any questions you have with your health care provider. ° °

## 2015-08-25 DIAGNOSIS — N320 Bladder-neck obstruction: Secondary | ICD-10-CM | POA: Diagnosis not present

## 2015-08-25 DIAGNOSIS — N3942 Incontinence without sensory awareness: Secondary | ICD-10-CM | POA: Diagnosis not present

## 2015-08-25 DIAGNOSIS — R3916 Straining to void: Secondary | ICD-10-CM | POA: Diagnosis not present

## 2015-08-26 LAB — URINE CULTURE: Culture: >=100000

## 2015-08-27 ENCOUNTER — Telehealth (HOSPITAL_BASED_OUTPATIENT_CLINIC_OR_DEPARTMENT_OTHER): Payer: Self-pay | Admitting: Emergency Medicine

## 2015-08-27 NOTE — Telephone Encounter (Signed)
Post ED Visit - Positive Culture Follow-up  Culture report reviewed by antimicrobial stewardship pharmacist:  [x]  Heide Guile, Pharm.D., BCPS []  Alycia Rossetti, Pharm.D., BCPS []  [place], [place].D., BCPS, AAHIVP []  Legrand Como, Pharm.D., BCPS, AAHIVP []  Colly[place], Pharm.D. []  Milus Glazier, Pharm.D.  Positive urine culture E. coli Treated with ciprofloxacin and metronidazole, organism sensitive to the same and no further patient follow-up is required at this time.  Hazle Nordmann 08/27/2015, 9:47 AM

## 2015-08-31 ENCOUNTER — Encounter: Payer: Self-pay | Admitting: Physician Assistant

## 2015-08-31 ENCOUNTER — Ambulatory Visit (INDEPENDENT_AMBULATORY_CARE_PROVIDER_SITE_OTHER): Payer: Medicare Other | Admitting: Physician Assistant

## 2015-08-31 VITALS — BP 108/60 | HR 93 | Temp 98.2°F | Resp 16 | Ht 70.0 in | Wt 225.1 lb

## 2015-08-31 DIAGNOSIS — K5792 Diverticulitis of intestine, part unspecified, without perforation or abscess without bleeding: Secondary | ICD-10-CM

## 2015-08-31 NOTE — Assessment & Plan Note (Signed)
Antibiotic regimen complete. No residual symptoms. Exam within normal limits. Resolved clinically. Supportive measures reviewed. Follow-up in October as scheduled.

## 2015-08-31 NOTE — Progress Notes (Signed)
Patient presents to clinic today for follow-up of acute diverticulitis after treatment with Ciprofloxacin and Flagyl. Endorses taking medications as directed. Endorses complete resolution of symptoms. Denies fever, chills, LLQ pain, nausea, vomiting. Is tolerating PO fluids and foods. Endorses good bowel output without tenesmus, melena or hematochezia.  Past Medical History  Diagnosis Date  . BPH (benign prostatic hyperplasia)     hx s/p turp  . Hyperlipidemia     takes Simvastatin and Tricor daily-under control  . Joint pain     both knees  . Joint swelling     rt knee  . Chronic back pain     spondylosis  . Melanoma     right hand;basal cell carcinoma  . H/O hiatal hernia   . History of colon polyps   . Diverticulitis   . Neuropathy   . Prostate infection     currently taking macrobid  . Diarrhea     x 1 today  . History of chicken pox   . Urinary incontinence   . UTI (lower urinary tract infection)   . Back pain at L4-L5 level   . Arthritis     both knees  . DDD (degenerative disc disease), lumbar   . History of kidney stones   . Diabetes mellitus     takes Metformin and Victoza daily-under control  . Hepatitis     B-with Mono and jaundice 1960  . GERD (gastroesophageal reflux disease)     takes Omeprazole daily-under control  . Hypertension     takes Lisinopril daily-under control  . H/O measles   . Pneumonia     hx of  . Tinnitus     Current Outpatient Prescriptions on File Prior to Visit  Medication Sig Dispense Refill  . fenofibrate (TRICOR) 145 MG tablet Take 1 tablet (145 mg total) by mouth every evening. 90 tablet 1  . glucose blood (FREESTYLE LITE) test strip Use as instructed 100 each 12  . Insulin Pen Needle (CAREFINE PEN NEEDLES) 31G X 8 MM MISC Use once daily with Victoza injections. 100 each 3  . Lancets (FREESTYLE) lancets 1 each by Other route 2 (two) times daily. Use as instructed 100 each 12  . Liraglutide 18 MG/3ML SOPN Inject 0.3 mLs (1.8  mg total) into the skin daily. 6 mL 3  . lisinopril (PRINIVIL,ZESTRIL) 10 MG tablet Take 1 tablet (10 mg total) by mouth every evening. 90 tablet 1  . metFORMIN (GLUCOPHAGE-XR) 500 MG 24 hr tablet Take 1 tablet (500 mg total) by mouth 2 (two) times daily with a meal. 180 tablet 1  . omeprazole (PRILOSEC) 20 MG capsule Take 1 capsule (20 mg total) by mouth daily. 90 capsule 1  . simvastatin (ZOCOR) 10 MG tablet Take 1 tablet (10 mg total) by mouth at bedtime. 90 tablet 1  . tamsulosin (FLOMAX) 0.4 MG CAPS capsule Take 0.4 mg by mouth every evening.      No current facility-administered medications on file prior to visit.    Allergies  Allergen Reactions  . Albamycin [Novobiocin]     Made me look like a strawberry   . Shrimp [Shellfish Allergy] Itching    Unsure if allergic - has been eating fried fish  And shell fish since with no reactions  . Penicillins Rash    Family History  Problem Relation Age of Onset  . Heart disease Mother 74    Deceased  . Bladder Cancer Mother   . Prostate cancer Father  Deceased-in 90s  . Healthy Brother   . Skin cancer Brother     #2  . Asthma Sister     Deceased  . Healthy Son     #1  . Healthy Daughter     #2  . Obesity Son     #2    Social History   Social History  . Marital Status: Married    Spouse Name: N/A  . Number of Children: N/A  . Years of Education: N/A   Social History Main Topics  . Smoking status: Former Smoker    Types: Pipe    Quit date: 07/14/1978  . Smokeless tobacco: Never Used     Comment: quit smoking 1979  . Alcohol Use: Yes     Comment: OCCASIONAL  . Drug Use: No  . Sexual Activity: Yes   Other Topics Concern  . None   Social History Narrative    Review of Systems - See HPI.  All other ROS are negative.  BP 108/60 mmHg  Pulse 93  Temp(Src) 98.2 F (36.8 C) (Oral)  Resp 16  Ht _0  (1.778 m)  Wt 225 lb 2 oz (102.116 kg)  BMI 32.30 kg/m2  SpO2 98%  Physical Exam  Constitutional: He is  oriented to person, place, and time and well-developed, well-nourished, and in no distress.  HENT:  Head: Normocephalic and atraumatic.  Cardiovascular: Normal rate, regular rhythm, normal heart sounds and intact distal pulses.   Pulmonary/Chest: Effort normal and breath sounds normal. No respiratory distress. He has no wheezes. He has no rales. He exhibits no tenderness.  Abdominal: Soft. Bowel sounds are normal. He exhibits no distension and no mass. There is no tenderness. There is no rebound and no guarding.  Neurological: He is alert and oriented to person, place, and time.  Skin: Skin is warm and dry. No rash noted.  Psychiatric: Affect normal.  Vitals reviewed.   Recent Results (from the past 2160 hour(s))  Urinalysis, Routine w reflex microscopic (not at [facility])     Status: Abnormal   Collection Time: 06/13/15  4:21 PM  Result Value Ref Range   Color, Urine YELLOW YELLOW   APPearance CLOUDY (A) CLEAR   Specific Gravity, Urine 1.024 1.005 - 1.030   pH 6.0 5.0 - 8.0   Glucose, UA NEGATIVE NEGATIVE mg/dL   Hgb urine dipstick SMALL (A) NEGATIVE   Bilirubin Urine NEGATIVE NEGATIVE   Ketones, ur NEGATIVE NEGATIVE mg/dL   Protein, ur 30 (A) NEGATIVE mg/dL   Urobilinogen, UA 1.0 0.0 - 1.0 mg/dL   Nitrite NEGATIVE NEGATIVE   Leukocytes, UA LARGE (A) NEGATIVE  Urine microscopic-add on     Status: Abnormal   Collection Time: 06/13/15  4:21 PM  Result Value Ref Range   Squamous Epithelial / LPF RARE RARE   WBC, UA TOO NUMEROUS TO COUNT <3 WBC/hpf   RBC / HPF 7-10 <3 RBC/hpf   Bacteria, UA MANY (A) RARE   Casts HYALINE CASTS (A) NEGATIVE  Urine culture     Status: None   Collection Time: 06/13/15  4:21 PM  Result Value Ref Range   Specimen Description URINE, CLEAN CATCH    Special Requests NONE    Culture      >=100,000 COLONIES/mL PROTEUS MIRABILIS Performed at [facility]    Report Status 06/16/2015 FINAL    Organism ID, Bacteria PROTEUS MIRABILIS        Susceptibility   Proteus mirabilis - MIC*    AMPICILLIN <=2  SENSITIVE Sensitive     CEFAZOLIN <=4 SENSITIVE Sensitive     CEFTRIAXONE <=1 SENSITIVE Sensitive     CIPROFLOXACIN <=0.25 SENSITIVE Sensitive     GENTAMICIN <=1 SENSITIVE Sensitive     IMIPENEM 2 SENSITIVE Sensitive     NITROFURANTOIN 128 RESISTANT Resistant     TRIMETH/SULFA <=20 SENSITIVE Sensitive     AMPICILLIN/SULBACTAM <=2 SENSITIVE Sensitive     PIP/TAZO <=4 SENSITIVE Sensitive     * >=100,000 COLONIES/mL PROTEUS MIRABILIS  POCT urinalysis dipstick     Status: Abnormal   Collection Time: 06/19/15  2:47 PM  Result Value Ref Range   Color, UA yellow    Clarity, UA clear    Glucose, UA neg    Bilirubin, UA neg    Ketones, UA neg    Spec Grav, UA 1.020    Blood, UA neg    pH, UA 6.0    Protein, UA neg    Urobilinogen, UA 0.2    Nitrite, UA neg    Leukocytes, UA small (1+) (A) Negative  POCT urinalysis dipstick     Status: Abnormal   Collection Time: 06/29/15 11:40 AM  Result Value Ref Range   Color, UA light-yellow    Clarity, UA clear    Glucose, UA neg    Bilirubin, UA neg    Ketones, UA neg    Spec Grav, UA 1.015    Blood, UA trace    pH, UA 6.5    Protein, UA neg    Urobilinogen, UA 0.2    Nitrite, UA neg    Leukocytes, UA Trace (A) Negative  Urine Culture     Status: None   Collection Time: 06/29/15 12:09 PM  Result Value Ref Range   Culture PROTEUS MIRABILIS    Colony Count 70,000 COLONIES/ML    Organism ID, Bacteria PROTEUS MIRABILIS       Susceptibility   Proteus mirabilis -  (no method available)    AMPICILLIN <=2 Sensitive     AMOX/CLAVULANIC <=2 Sensitive     AMPICILLIN/SULBACTAM <=2 Sensitive     PIP/TAZO <=4 Sensitive     IMIPENEM 4 Sensitive     CEFAZOLIN <=4 Sensitive     CEFTRIAXONE <=1 Sensitive     CEFTAZIDIME <=1 Sensitive     CEFEPIME <=1 Sensitive     GENTAMICIN <=1 Sensitive     TOBRAMYCIN <=1 Sensitive     CIPROFLOXACIN <=0.25 Sensitive     LEVOFLOXACIN <=0.12  Sensitive     NITROFURANTOIN 128 Resistant     TRIMETH/SULFA <=20 Sensitive   CBC with Differential/Platelet     Status: Abnormal   Collection Time: 08/23/15 11:20 AM  Result Value Ref Range   WBC 6.3 4.0 - 10.5 K/uL   RBC 4.41 4.22 - 5.81 MIL/uL   Hemoglobin 12.1 (L) 13.0 - 17.0 g/dL   HCT 38.3 (L) 39.0 - 52.0 %   MCV 86.8 78.0 - 100.0 fL   MCH 27.4 26.0 - 34.0 pg   MCHC 31.6 30.0 - 36.0 g/dL   RDW 13.9 11.5 - 15.5 %   Platelets 259 150 - 400 K/uL   Neutrophils Relative % 64 43 - 77 %   Neutro Abs 4.1 1.7 - 7.7 K/uL   Lymphocytes Relative 24 12 - 46 %   Lymphs Abs 1.5 0.7 - 4.0 K/uL   Monocytes Relative 10 3 - 12 %   Monocytes Absolute 0.6 0.1 - 1.0 K/uL   Eosinophils Relative 1 0 - 5 %  Eosinophils Absolute 0.1 0.0 - 0.7 K/uL   Basophils Relative 1 0 - 1 %   Basophils Absolute 0.0 0.0 - 0.1 K/uL  Comprehensive metabolic panel     Status: Abnormal   Collection Time: 08/23/15 11:20 AM  Result Value Ref Range   Sodium 141 135 - 145 mmol/L   Potassium 3.8 3.5 - 5.1 mmol/L   Chloride 108 101 - 111 mmol/L   CO2 25 22 - 32 mmol/L   Glucose, Bld 142 (H) 65 - 99 mg/dL   BUN 15 6 - 20 mg/dL   Creatinine, Ser 1.06 0.61 - 1.24 mg/dL   Calcium 9.4 8.9 - 10.3 mg/dL   Total Protein 6.7 6.5 - 8.1 g/dL   Albumin 3.7 3.5 - 5.0 g/dL   AST 13 (L) 15 - 41 U/L   ALT 15 (L) 17 - 63 U/L   Alkaline Phosphatase 45 38 - 126 U/L   Total Bilirubin 0.6 0.3 - 1.2 mg/dL   GFR calc non Af Amer >60 >60 mL/min   GFR calc Af Amer >60 >60 mL/min    Comment: (NOTE) The eGFR has been calculated using the CKD EPI equation. This calculation has not been validated in all clinical situations. eGFR's persistently <60 mL/min signify possible Chronic Kidney Disease.    Anion gap 8 5 - 15  Urinalysis, Routine w reflex microscopic (not at [facility])     Status: Abnormal   Collection Time: 08/23/15 12:30 PM  Result Value Ref Range   Color, Urine YELLOW YELLOW   APPearance CLEAR CLEAR   Specific Gravity, Urine  1.042 (H) 1.005 - 1.030   pH 7.0 5.0 - 8.0   Glucose, UA NEGATIVE NEGATIVE mg/dL   Hgb urine dipstick NEGATIVE NEGATIVE   Bilirubin Urine NEGATIVE NEGATIVE   Ketones, ur NEGATIVE NEGATIVE mg/dL   Protein, ur NEGATIVE NEGATIVE mg/dL   Urobilinogen, UA 1.0 0.0 - 1.0 mg/dL   Nitrite NEGATIVE NEGATIVE   Leukocytes, UA MODERATE (A) NEGATIVE  Urine microscopic-add on     Status: Abnormal   Collection Time: 08/23/15 12:30 PM  Result Value Ref Range   Squamous Epithelial / LPF RARE RARE   WBC, UA 7-10 <3 WBC/hpf   RBC / HPF 0-2 <3 RBC/hpf   Bacteria, UA FEW (A) RARE  Urine culture     Status: None   Collection Time: 08/23/15 12:30 PM  Result Value Ref Range   Specimen Description URINE, CLEAN CATCH    Special Requests NONE    Culture      >=100,000 COLONIES/mL ESCHERICHIA COLI Performed at [facility]    Report Status 08/26/2015 FINAL    Organism ID, Bacteria ESCHERICHIA COLI       Susceptibility   Escherichia coli - MIC*    AMPICILLIN 8 SENSITIVE Sensitive     CEFAZOLIN <=4 SENSITIVE Sensitive     CEFTRIAXONE <=1 SENSITIVE Sensitive     CIPROFLOXACIN >=4 RESISTANT Resistant     GENTAMICIN <=1 SENSITIVE Sensitive     IMIPENEM <=0.25 SENSITIVE Sensitive     NITROFURANTOIN <=16 SENSITIVE Sensitive     TRIMETH/SULFA <=20 SENSITIVE Sensitive     AMPICILLIN/SULBACTAM 4 SENSITIVE Sensitive     PIP/TAZO <=4 SENSITIVE Sensitive     * >=100,000 COLONIES/mL ESCHERICHIA COLI    Assessment/Plan: Acute diverticulitis Antibiotic regimen complete. No residual symptoms. Exam within normal limits. Resolved clinically. Supportive measures reviewed. Follow-up in October as scheduled.

## 2015-08-31 NOTE — Patient Instructions (Signed)
Please stay well hydrated. Ok to restart fiber supplement. Call or return to clinic if symptoms recur.  Have fun at the beach!

## 2015-08-31 NOTE — Progress Notes (Signed)
Pre visit review using our clinic review tool, if applicable. No additional management support is needed unless otherwise documented below in the visit note/SLS  

## 2015-09-08 DIAGNOSIS — R3916 Straining to void: Secondary | ICD-10-CM | POA: Diagnosis not present

## 2015-09-08 DIAGNOSIS — N320 Bladder-neck obstruction: Secondary | ICD-10-CM | POA: Diagnosis not present

## 2015-09-08 DIAGNOSIS — M6281 Muscle weakness (generalized): Secondary | ICD-10-CM | POA: Diagnosis not present

## 2015-09-08 DIAGNOSIS — R3912 Poor urinary stream: Secondary | ICD-10-CM | POA: Diagnosis not present

## 2015-09-08 DIAGNOSIS — R278 Other lack of coordination: Secondary | ICD-10-CM | POA: Diagnosis not present

## 2015-09-15 DIAGNOSIS — R3916 Straining to void: Secondary | ICD-10-CM | POA: Diagnosis not present

## 2015-09-15 DIAGNOSIS — R3912 Poor urinary stream: Secondary | ICD-10-CM | POA: Diagnosis not present

## 2015-09-15 DIAGNOSIS — M6281 Muscle weakness (generalized): Secondary | ICD-10-CM | POA: Diagnosis not present

## 2015-09-15 DIAGNOSIS — R278 Other lack of coordination: Secondary | ICD-10-CM | POA: Diagnosis not present

## 2015-09-15 DIAGNOSIS — N3942 Incontinence without sensory awareness: Secondary | ICD-10-CM | POA: Diagnosis not present

## 2015-09-15 DIAGNOSIS — R351 Nocturia: Secondary | ICD-10-CM | POA: Diagnosis not present

## 2015-09-18 ENCOUNTER — Ambulatory Visit (INDEPENDENT_AMBULATORY_CARE_PROVIDER_SITE_OTHER): Payer: Medicare Other | Admitting: Physician Assistant

## 2015-09-18 ENCOUNTER — Encounter: Payer: Self-pay | Admitting: Physician Assistant

## 2015-09-18 VITALS — BP 106/70 | HR 80 | Temp 98.2°F | Resp 16 | Ht 70.0 in | Wt 224.1 lb

## 2015-09-18 DIAGNOSIS — K219 Gastro-esophageal reflux disease without esophagitis: Secondary | ICD-10-CM

## 2015-09-18 DIAGNOSIS — E119 Type 2 diabetes mellitus without complications: Secondary | ICD-10-CM | POA: Diagnosis not present

## 2015-09-18 LAB — BASIC METABOLIC PANEL
BUN: 23 mg/dL (ref 6–23)
CHLORIDE: 106 meq/L (ref 96–112)
CO2: 27 mEq/L (ref 19–32)
CREATININE: 1.08 mg/dL (ref 0.40–1.50)
Calcium: 9.9 mg/dL (ref 8.4–10.5)
GFR: 70.38 mL/min (ref >60.00)
GLUCOSE: 146 mg/dL — AB (ref 70–99)
Potassium: 4.4 mEq/L (ref 3.5–5.1)
Sodium: 140 mEq/L (ref 135–145)

## 2015-09-18 LAB — HEMOGLOBIN A1C: HEMOGLOBIN A1C: 6.7 % — AB (ref 4.6–6.5)

## 2015-09-18 NOTE — Progress Notes (Signed)
Patient presents to clinic today for 35-monthfollow-up of Diabetes Mellitus II and GERD.  Diabetes Mellitus II -- Endorses taking medications as directed without side effect. Checking fasting sugars daily. Endorses averaging 100-115 daily. Is consistent with diet. Last A1C at 6.3. Is up-to-date on foot exam and OP examination.  GERD -- Endorses well-controlled with current regimen. Denies breakthrough symptoms.   Past Medical History  Diagnosis Date  . BPH (benign prostatic hyperplasia)     hx s/p turp  . Hyperlipidemia     takes Simvastatin and Tricor daily-under control  . Joint pain     both knees  . Joint swelling     rt knee  . Chronic back pain     spondylosis  . [place]     right hand;basal cell carcinoma  . H/O hiatal hernia   . History of colon polyps   . Diverticulitis   . [place]   . Prostate infection     currently taking macrobid  . Diarrhea     x 1 today  . History of chicken pox   . Urinary incontinence   . UTI (lower urinary tract infection)   . Back pain at L4-L5 level   . Arthritis     both knees  . DDD (degenerative disc disease), lumbar   . History of kidney stones   . Diabetes mellitus     takes Metformin and Victoza daily-under control  . Hepatitis     B-with Mono and jaundice 1960  . GERD (gastroesophageal reflux disease)     takes Omeprazole daily-under control  . Hypertension     takes Lisinopril daily-under control  . H/O measles   . Pneumonia     hx of  . Tinnitus     Current Outpatient Prescriptions on File Prior to Visit  Medication Sig Dispense Refill  . fenofibrate (TRICOR) 145 MG tablet Take 1 tablet (145 mg total) by mouth every evening. 90 tablet 1  . glucose blood (FREESTYLE LITE) test strip Use as instructed 100 each 12  . Insulin Pen Needle (CAREFINE PEN NEEDLES) 31G X 8 MM MISC Use once daily with Victoza injections. 100 each 3  . Lancets (FREESTYLE) lancets 1 each by Other route 2 (two) times daily.  Use as instructed 100 each 12  . Liraglutide 18 MG/3ML SOPN Inject 0.3 mLs (1.8 mg total) into the skin daily. 6 mL 3  . lisinopril (PRINIVIL,ZESTRIL) 10 MG tablet Take 1 tablet (10 mg total) by mouth every evening. 90 tablet 1  . metFORMIN (GLUCOPHAGE-XR) 500 MG 24 hr tablet Take 1 tablet (500 mg total) by mouth 2 (two) times daily with a meal. 180 tablet 1  . omeprazole (PRILOSEC) 20 MG capsule Take 1 capsule (20 mg total) by mouth daily. 90 capsule 1  . simvastatin (ZOCOR) 10 MG tablet Take 1 tablet (10 mg total) by mouth at bedtime. 90 tablet 1  . tamsulosin (FLOMAX) 0.4 MG CAPS capsule Take 0.4 mg by mouth every evening.      No current facility-administered medications on file prior to visit.    Allergies  Allergen Reactions  . Albamycin [Novobiocin]     Made me look like a strawberry   . Shrimp [Shellfish Allergy] Itching    Unsure if allergic - has been eating fried fish  And shell fish since with no reactions  . Penicillins Rash    Family History  Problem Relation Age of Onset  . Heart disease Mother 971  Deceased  . Bladder Cancer Mother   . Prostate cancer Father     Deceased-in 39s  . Healthy Brother   . Skin cancer Brother     #2  . Asthma Sister     Deceased  . Healthy Son     #1  . Healthy Daughter     #2  . Obesity Son     #2    Social History   Social History  . Marital Status: Married    Spouse Name: N/A  . Number of Children: N/A  . Years of Education: N/A   Social History Main Topics  . Smoking status: Former Smoker    Types: Pipe    Quit date: 07/14/1978  . Smokeless tobacco: Never Used     Comment: quit smoking 1979  . Alcohol Use: Yes     Comment: OCCASIONAL  . Drug Use: No  . Sexual Activity: Yes   Other Topics Concern  . None   Social History Narrative    Review of Systems - See HPI.  All other ROS are negative.  BP 106/70 mmHg  Pulse 80  Temp(Src) 98.2 F (36.8 C) (Oral)  Resp 16  Ht 5' 10"  (1.778 m)  Wt 224 lb 2 oz  (101.662 kg)  BMI 32.16 kg/m2  SpO2 98%  Physical Exam  Constitutional: He is oriented to person, place, and time and well-developed, well-nourished, and in no distress.  HENT:  Head: Normocephalic and atraumatic.  Eyes: Conjunctivae are normal.  Cardiovascular: Normal rate, regular rhythm, normal heart sounds and intact distal pulses.   Pulmonary/Chest: Effort normal and breath sounds normal. No respiratory distress. He has no wheezes. He has no rales. He exhibits no tenderness.  Neurological: He is alert and oriented to person, place, and time.  Skin: Skin is warm and dry. No rash noted.  Psychiatric: Affect normal.  Vitals reviewed.   Recent Results (from the past 2160 hour(s))  POCT urinalysis dipstick     Status: Abnormal   Collection Time: 06/29/15 11:40 AM  Result Value Ref Range   Color, UA light-yellow    Clarity, UA clear    Glucose, UA neg    Bilirubin, UA neg    Ketones, UA neg    Spec Grav, UA 1.015    Blood, UA trace    pH, UA 6.5    Protein, UA neg    Urobilinogen, UA 0.2    Nitrite, UA neg    Leukocytes, UA Trace (A) Negative  Urine Culture     Status: None   Collection Time: 06/29/15 12:09 PM  Result Value Ref Range   Culture PROTEUS MIRABILIS    Colony Count 70,000 COLONIES/ML    Organism ID, Bacteria PROTEUS MIRABILIS       Susceptibility   Proteus mirabilis -  (no method available)    AMPICILLIN <=2 Sensitive     AMOX/CLAVULANIC <=2 Sensitive     AMPICILLIN/SULBACTAM <=2 Sensitive     PIP/TAZO <=4 Sensitive     IMIPENEM 4 Sensitive     CEFAZOLIN <=4 Sensitive     CEFTRIAXONE <=1 Sensitive     CEFTAZIDIME <=1 Sensitive     CEFEPIME <=1 Sensitive     GENTAMICIN <=1 Sensitive     TOBRAMYCIN <=1 Sensitive     CIPROFLOXACIN <=0.25 Sensitive     LEVOFLOXACIN <=0.12 Sensitive     NITROFURANTOIN 128 Resistant     TRIMETH/SULFA <=20 Sensitive   CBC with Differential/Platelet     Status:  Abnormal   Collection Time: 08/23/15 11:20 AM  Result Value  Ref Range   WBC 6.3 4.0 - 10.5 K/uL   RBC 4.41 4.22 - 5.81 MIL/uL   Hemoglobin 12.1 (L) 13.0 - 17.0 g/dL   HCT 38.3 (L) 39.0 - 52.0 %   MCV 86.8 78.0 - 100.0 fL   MCH 27.4 26.0 - 34.0 pg   MCHC 31.6 30.0 - 36.0 g/dL   RDW 13.9 11.5 - 15.5 %   Platelets 259 150 - 400 K/uL   Neutrophils Relative % 64 43 - 77 %   Neutro Abs 4.1 1.7 - 7.7 K/uL   Lymphocytes Relative 24 12 - 46 %   Lymphs Abs 1.5 0.7 - 4.0 K/uL   Monocytes Relative 10 3 - 12 %   Monocytes Absolute 0.6 0.1 - 1.0 K/uL   Eosinophils Relative 1 0 - 5 %   Eosinophils Absolute 0.1 0.0 - 0.7 K/uL   Basophils Relative 1 0 - 1 %   Basophils Absolute 0.0 0.0 - 0.1 K/uL  Comprehensive metabolic panel     Status: Abnormal   Collection Time: 08/23/15 11:20 AM  Result Value Ref Range   Sodium 141 135 - 145 mmol/L   Potassium 3.8 3.5 - 5.1 mmol/L   Chloride 108 101 - 111 mmol/L   CO2 25 22 - 32 mmol/L   Glucose, Bld 142 (H) 65 - 99 mg/dL   BUN 15 6 - 20 mg/dL   Creatinine, Ser 1.06 0.61 - 1.24 mg/dL   Calcium 9.4 8.9 - 10.3 mg/dL   Total Protein 6.7 6.5 - 8.1 g/dL   Albumin 3.7 3.5 - 5.0 g/dL   AST 13 (L) 15 - 41 U/L   ALT 15 (L) 17 - 63 U/L   Alkaline Phosphatase 45 38 - 126 U/L   Total Bilirubin 0.6 0.3 - 1.2 mg/dL   GFR calc non Af Amer >60 >60 mL/min   GFR calc Af Amer >60 >60 mL/min    Comment: (NOTE) The eGFR has been calculated using the CKD EPI equation. This calculation has not been validated in all clinical situations. eGFR's persistently <60 mL/min signify possible Chronic Kidney Disease.    Anion gap 8 5 - 15  Urinalysis, Routine w reflex microscopic (not at [facility])     Status: Abnormal   Collection Time: 08/23/15 12:30 PM  Result Value Ref Range   Color, Urine YELLOW YELLOW   APPearance CLEAR CLEAR   Specific Gravity, Urine 1.042 (H) 1.005 - 1.030   pH 7.0 5.0 - 8.0   Glucose, UA NEGATIVE NEGATIVE mg/dL   Hgb urine dipstick NEGATIVE NEGATIVE   Bilirubin Urine NEGATIVE NEGATIVE   Ketones, ur NEGATIVE  NEGATIVE mg/dL   Protein, ur NEGATIVE NEGATIVE mg/dL   Urobilinogen, UA 1.0 0.0 - 1.0 mg/dL   Nitrite NEGATIVE NEGATIVE   Leukocytes, UA MODERATE (A) NEGATIVE  Urine microscopic-add on     Status: Abnormal   Collection Time: 08/23/15 12:30 PM  Result Value Ref Range   Squamous Epithelial / LPF RARE RARE   WBC, UA 7-10 <3 WBC/hpf   RBC / HPF 0-2 <3 RBC/hpf   Bacteria, UA FEW (A) RARE  Urine culture     Status: None   Collection Time: 08/23/15 12:30 PM  Result Value Ref Range   Specimen Description URINE, CLEAN CATCH    Special Requests NONE    Culture      >=100,000 COLONIES/mL ESCHERICHIA COLI Performed at [facility]  Report Status 08/26/2015 FINAL    Organism ID, Bacteria ESCHERICHIA COLI       Susceptibility   Escherichia coli - MIC*    AMPICILLIN 8 SENSITIVE Sensitive     CEFAZOLIN <=4 SENSITIVE Sensitive     CEFTRIAXONE <=1 SENSITIVE Sensitive     CIPROFLOXACIN >=4 RESISTANT Resistant     GENTAMICIN <=1 SENSITIVE Sensitive     IMIPENEM <=0.25 SENSITIVE Sensitive     NITROFURANTOIN <=16 SENSITIVE Sensitive     TRIMETH/SULFA <=20 SENSITIVE Sensitive     AMPICILLIN/SULBACTAM 4 SENSITIVE Sensitive     PIP/TAZO <=4 SENSITIVE Sensitive     * >=100,000 COLONIES/mL ESCHERICHIA COLI  Basic Metabolic Panel (BMET)     Status: Abnormal   Collection Time: 09/18/15 10:00 AM  Result Value Ref Range   Sodium 140 135 - 145 mEq/L   Potassium 4.4 3.5 - 5.1 mEq/L   Chloride 106 96 - 112 mEq/L   CO2 27 19 - 32 mEq/L   Glucose, Bld 146 (H) 70 - 99 mg/dL   BUN 23 6 - 23 mg/dL   Creatinine, Ser 1.08 0.40 - 1.50 mg/dL   Calcium 9.9 8.4 - 10.5 mg/dL   GFR 70.38 >60.00 mL/min  Hemoglobin A1c     Status: Abnormal   Collection Time: 09/18/15 10:00 AM  Result Value Ref Range   Hgb A1c MFr Bld 6.7 (H) 4.6 - 6.5 %    Comment: Glycemic Control Guidelines for People with Diabetes:Non Diabetic:  <6%Goal of Therapy: <7%Additional Action Suggested:  >8%     Assessment/Plan: Diabetes  mellitus type II, controlled Previously well-controlled. HM up-to-date. Will check BMP and A1C today.  Gastroesophageal reflux disease without esophagitis Stable. Continue current regimen.

## 2015-09-18 NOTE — Patient Instructions (Signed)
Please continue medications as directed. Stay well hydrated. Continue checking sugars as directed.  Stop by the lab for blood work. I will call with your results. You will be contacted by ENT for assessment.

## 2015-09-18 NOTE — Progress Notes (Signed)
Pre visit review using our clinic review tool, if applicable. No additional management support is needed unless otherwise documented below in the visit note/SLS  

## 2015-09-21 NOTE — Assessment & Plan Note (Signed)
Stable  Continue current regimen  

## 2015-09-21 NOTE — Assessment & Plan Note (Signed)
Previously well-controlled. HM up-to-date. Will check BMP and A1C today.

## 2015-10-06 ENCOUNTER — Encounter: Payer: Self-pay | Admitting: Physician Assistant

## 2015-10-07 MED ORDER — LISINOPRIL 10 MG PO TABS: 10.0000 mg | ORAL_TABLET | Freq: Every evening | ORAL | Status: DC

## 2015-10-12 DIAGNOSIS — H25812 Combined forms of age-related cataract, left eye: Secondary | ICD-10-CM | POA: Diagnosis not present

## 2015-10-12 DIAGNOSIS — H2513 Age-related nuclear cataract, bilateral: Secondary | ICD-10-CM | POA: Diagnosis not present

## 2015-10-12 DIAGNOSIS — H2512 Age-related nuclear cataract, left eye: Secondary | ICD-10-CM | POA: Diagnosis not present

## 2015-10-13 DIAGNOSIS — H2511 Age-related nuclear cataract, right eye: Secondary | ICD-10-CM | POA: Diagnosis not present

## 2015-10-26 DIAGNOSIS — H2513 Age-related nuclear cataract, bilateral: Secondary | ICD-10-CM | POA: Diagnosis not present

## 2015-10-26 DIAGNOSIS — H25811 Combined forms of age-related cataract, right eye: Secondary | ICD-10-CM | POA: Diagnosis not present

## 2015-10-26 DIAGNOSIS — H2511 Age-related nuclear cataract, right eye: Secondary | ICD-10-CM | POA: Diagnosis not present

## 2015-10-30 ENCOUNTER — Encounter: Payer: Self-pay | Admitting: Physician Assistant

## 2015-10-30 MED ORDER — GLUCOSE BLOOD VI STRP: ORAL_STRIP | Status: DC

## 2015-11-10 ENCOUNTER — Encounter: Payer: Self-pay | Admitting: Physician Assistant

## 2015-11-11 ENCOUNTER — Telehealth: Payer: Self-pay | Admitting: *Deleted

## 2015-11-11 MED ORDER — GLUCOSE BLOOD VI STRP: ORAL_STRIP | Status: DC

## 2015-11-11 NOTE — Telephone Encounter (Signed)
RE: Non-Urgent Medical Question     From   Brunetta Jeans, PA-C    To   [facility]   11/10/2015 11:17 AM       Last Read in [place]   11/10/2015 3:10 PM by Paula Compton,   I will send this to my nurse so she can contact [facility] and get this handled.   Brunetta Jeans, PA-C          Previous Messages     ----- Message -----   From: Nita Sells   Sent: 11/10/2015 10:19 AM EST    To: Leeanne Rio, PA-C  Subject: Non-Urgent Medical Question   Einar Pheasant, I have just finished a long discussion with [facility]. They have been sending me only a 50 day supply of the Freestyle test strips and lancets all summer. I finally got to the bottom as to why. Due to the the package qty you have to get pre-authorization to fill the 90 day supply. They are asking that you call [phone] to authorization a 90 day supply and you will need to write two new prescriptions. With these new prescriptions all existing prescription will be cancelled. What has been happening to me is that I have been paying double co-pay every 50 days. Any questions please call me @ [phone]. Thanks Lanny Hurst.        Non-Urgent Medical Question     From   Nita Sells    To   Brunetta Jeans, PA-C    Sent   11/10/2015 10:19 AM       Einar Pheasant, I have just finished a long discussion with [facility]. They have been sending me only a 50 day supply of the Freestyle test strips and lancets all summer. I finally got to the bottom as to why. Due to the the package qty you have to get pre-authorization to fill the 90 day supply. They are asking that you call [phone] to authorization a 90 day supply and you will need to write two new prescriptions. With these new prescriptions all existing prescription will be cancelled. What has been happening to me is that I have been paying double co-pay every 50 days. Any  questions please call me @ [phone]. Thanks       NEW RX WITH UPDATED TESTING INSTRUCTION [TWICE DAILY] AND NOTE TO PHARMACY TO DISPENSE 90-DAY SUPPLY WITH ICD-10 DIAGNOSIS CODE ATTACHED SENT ELECTRONICALLY AND FAXED TO [facility]/sls

## 2015-11-14 ENCOUNTER — Encounter: Payer: Self-pay | Admitting: Physician Assistant

## 2015-11-14 DIAGNOSIS — E119 Type 2 diabetes mellitus without complications: Secondary | ICD-10-CM

## 2015-11-14 MED ORDER — FREESTYLE LANCETS MISC: 1.0000 | Freq: Two times a day (BID) | Status: DC

## 2015-11-20 ENCOUNTER — Encounter: Payer: Self-pay | Admitting: Physician Assistant

## 2015-11-20 DIAGNOSIS — E119 Type 2 diabetes mellitus without complications: Secondary | ICD-10-CM

## 2015-11-20 MED ORDER — CELECOXIB 200 MG PO CAPS: 200.0000 mg | ORAL_CAPSULE | Freq: Two times a day (BID) | ORAL | Status: DC

## 2015-11-20 MED ORDER — METFORMIN HCL ER 500 MG PO TB24: 500.0000 mg | ORAL_TABLET | Freq: Two times a day (BID) | ORAL | Status: DC

## 2015-11-26 DIAGNOSIS — M1712 Unilateral primary osteoarthritis, left knee: Secondary | ICD-10-CM | POA: Diagnosis not present

## 2015-12-03 ENCOUNTER — Encounter: Payer: Self-pay | Admitting: Physician Assistant

## 2015-12-03 DIAGNOSIS — M1712 Unilateral primary osteoarthritis, left knee: Secondary | ICD-10-CM | POA: Diagnosis not present

## 2015-12-03 MED ORDER — FENOFIBRATE 145 MG PO TABS: 145.0000 mg | ORAL_TABLET | Freq: Every evening | ORAL | Status: DC

## 2015-12-03 NOTE — Telephone Encounter (Signed)
Medication filled to pharmacy as requested.   

## 2015-12-07 ENCOUNTER — Encounter: Payer: Self-pay | Admitting: Physician Assistant

## 2015-12-07 ENCOUNTER — Other Ambulatory Visit: Payer: Self-pay | Admitting: Physician Assistant

## 2015-12-07 DIAGNOSIS — R42 Dizziness and giddiness: Secondary | ICD-10-CM

## 2015-12-07 DIAGNOSIS — E119 Type 2 diabetes mellitus without complications: Secondary | ICD-10-CM

## 2015-12-07 MED ORDER — FREESTYLE LANCETS MISC: 1.0000 | Freq: Two times a day (BID) | Status: DC

## 2015-12-10 ENCOUNTER — Encounter: Payer: Self-pay | Admitting: Physician Assistant

## 2015-12-10 DIAGNOSIS — M1712 Unilateral primary osteoarthritis, left knee: Secondary | ICD-10-CM | POA: Diagnosis not present

## 2015-12-11 ENCOUNTER — Telehealth: Payer: Self-pay | Admitting: Physician Assistant

## 2015-12-11 ENCOUNTER — Telehealth: Payer: Self-pay | Admitting: *Deleted

## 2015-12-11 NOTE — Telephone Encounter (Signed)
Received fax from [facility] for PA on Victoza [Trulicity, Byetta]; completed and faxed, awaiting response/SLS

## 2015-12-11 NOTE — Telephone Encounter (Signed)
Caller name: Jahden   Relationship to patient: Self  Can be reached: [phone]  Pharmacy:[facility], [place]  Reason for call: Pt says that his insurance is requesting a PA on his Liraglutide Rx.

## 2015-12-11 NOTE — Telephone Encounter (Signed)
Please refer to previous phone note from 12/30:  Rockwell Germany, CMA at 12/09/2015 6:12 PM     Status: Signed       Expand All Collapse All   Received FMLA Disability forms from [facility] for patient to be completed by provider; forwarded to PCP, awaiting return to office Tues, Dec 14, 2012/SLS 12/28         Please call patient inform him of this information/SLS Thanks.

## 2015-12-16 NOTE — Telephone Encounter (Signed)
Oneta Rack at 12/16/2015 1:28 PM     Status: Signed        Patient calling checking on the status of PA         [place] at 12/11/2015 11:52 AM     Status: Signed        Please refer to previous phone note from 12/30:  Rockwell Germany, CMA at 12/09/2015 6:12 PM     Status: Signed       Expand All Collapse All  Received FMLA Disability forms from [facility] for patient to be completed by provider; forwarded to PCP, awaiting return to office Tues, Dec 14, 2012/SLS 12/28       Please call patient inform him of this information/SLS Thanks.         Shiquita C Johnson at 12/11/2015 11:40 AM     Status: Signed       Expand All Collapse All   Caller name: Kasim   Relationship to patient: Self  Can be reached: [phone]  Ph[facility]acy:[facility], [place]  Reason for call: Pt says that his insurance is requesting a PA on his Liraglutide Rx.

## 2015-12-16 NOTE — Telephone Encounter (Signed)
Patient calling checking on the status of PA

## 2015-12-16 NOTE — Telephone Encounter (Signed)
Copy  Pasted in original note.

## 2015-12-18 MED ORDER — LIRAGLUTIDE 18 MG/3ML ~~LOC~~ SOPN: 0.3000 mL | PEN_INJECTOR | Freq: Every day | SUBCUTANEOUS | Status: DC

## 2015-12-18 NOTE — Addendum Note (Signed)
Addended by: Rudene Anda on: 12/18/2015 02:57 PM   Modules accepted: Orders

## 2015-12-18 NOTE — Telephone Encounter (Signed)
Checked status of PA with [employer] and it is still processing. [id number]

## 2015-12-18 NOTE — Telephone Encounter (Addendum)
Late entry:  Pt came into clinic on 12/18/15 @ 12:00p requesting an update concerning his Victoza.  States he has not heard back from anybody in our office.  Pt was informed that a PA has been initiated, but no response yet.  Pt states that he was down to his last few doses, therefore patient was given a Victoza pen sample per C. Martin's approval.    Sample given:  Victoza 18mg /56ml.  Inject 0.3 mLs (1.8 mg total) into the skin daily. - Subcutaneous  Lot #: [id number].  Exp. Date: 1/18.

## 2015-12-21 ENCOUNTER — Encounter: Payer: Self-pay | Admitting: Physician Assistant

## 2015-12-21 NOTE — Telephone Encounter (Signed)
Received letter from [place] dated 12/10/15, requesting the PA Request form [was faxed successfully on 12/11/15]; resent [via fax]; complete paperwork for PA on Victoza/SLS

## 2015-12-23 NOTE — Telephone Encounter (Signed)
Received PA Approved for Testosterone; faxed to pharmacy; valid thru 12/11/2098, pt informed/SLS 01/11

## 2015-12-25 DIAGNOSIS — N3942 Incontinence without sensory awareness: Secondary | ICD-10-CM | POA: Diagnosis not present

## 2015-12-25 DIAGNOSIS — R351 Nocturia: Secondary | ICD-10-CM | POA: Diagnosis not present

## 2015-12-25 DIAGNOSIS — Z0000 Encounter for general adult medical examination without abnormal findings: Secondary | ICD-10-CM | POA: Diagnosis not present

## 2015-12-27 ENCOUNTER — Encounter: Payer: Self-pay | Admitting: Physician Assistant

## 2015-12-28 MED ORDER — OMEPRAZOLE 20 MG PO CPDR: 20.0000 mg | DELAYED_RELEASE_CAPSULE | Freq: Every day | ORAL | Status: DC

## 2016-01-05 ENCOUNTER — Encounter: Payer: Self-pay | Admitting: Physician Assistant

## 2016-01-05 ENCOUNTER — Ambulatory Visit (INDEPENDENT_AMBULATORY_CARE_PROVIDER_SITE_OTHER): Payer: Medicare Other | Admitting: Physician Assistant

## 2016-01-05 VITALS — BP 140/74 | HR 81 | Temp 97.7°F | Ht 70.0 in | Wt 225.2 lb

## 2016-01-05 DIAGNOSIS — N39490 Overflow incontinence: Secondary | ICD-10-CM | POA: Diagnosis not present

## 2016-01-05 DIAGNOSIS — N400 Enlarged prostate without lower urinary tract symptoms: Secondary | ICD-10-CM | POA: Diagnosis not present

## 2016-01-05 NOTE — Patient Instructions (Signed)
Please continue medications as directed.  You will be contacted for assessment at [facility]. Call me if you have not heard from specialist office by Friday

## 2016-01-05 NOTE — Progress Notes (Addendum)
Patient presents to clinic today to request referral to another Urology practice. Is currently followed by [facility] for urinary incontinence and BPH. Endorses still having issues with urinary flow despite medications and prior procedures. Is catheterizing before bedtime per Urology orders but is still waking up in the morning with wet undergarments.  Past Medical History  Diagnosis Date  . BPH (benign prostatic hyperplasia)     hx s/p turp  . Hyperlipidemia     takes Simvastatin and Tricor daily-under control  . Joint pain     both knees  . Joint swelling     rt knee  . Chronic back pain     spondylosis  . Melanoma ([place])     right hand;basal cell carcinoma  . H/O hiatal hernia   . History of colon polyps   . Diverticulitis   . Neuropathy ([place])   . Prostate infection     currently taking macrobid  . Diarrhea     x 1 today  . History of chicken pox   . Urinary incontinence   . UTI (lower urinary tract infection)   . Back pain at L4-L5 level   . Arthritis     both knees  . DDD (degenerative disc disease), lumbar   . History of kidney stones   . Diabetes mellitus     takes Metformin and Victoza daily-under control  . Hepatitis     B-with Mono and jaundice 1960  . GERD (gastroesophageal reflux disease)     takes Omeprazole daily-under control  . Hypertension     takes Lisinopril daily-under control  . H/O measles   . Pneumonia     hx of  . Tinnitus     Current Outpatient Prescriptions on File Prior to Visit  Medication Sig Dispense Refill  . aspirin 81 MG tablet Take 81 mg by mouth daily.    . Calcium Carb-Cholecalciferol (CALCIUM-VITAMIN D3) 600-400 MG-UNIT TABS Take 1 each by mouth daily.    . celecoxib (CELEBREX) 200 MG capsule Take 1 capsule (200 mg total) by mouth 2 (two) times daily. 180 capsule 0  . fenofibrate (TRICOR) 145 MG tablet Take 1 tablet (145 mg total) by mouth every evening. 90 tablet 1  . glucose blood (FREESTYLE LITE) test strip Use as  instructed to test Blood Glucose twice daily Dx: E11.9 200 each 3  . Insulin Pen Needle (CAREFINE PEN NEEDLES) 31G X 8 MM MISC Use once daily with Victoza injections. 100 each 3  . Lancets (FREESTYLE) lancets 1 each by Other route 2 (two) times daily. Use as instructed 200 each 3  . Liraglutide 18 MG/3ML SOPN Inject 0.3 mLs (1.8 mg total) into the skin daily. 6 mL 0  . lisinopril (PRINIVIL,ZESTRIL) 10 MG tablet Take 1 tablet (10 mg total) by mouth every evening. 90 tablet 1  . metFORMIN (GLUCOPHAGE-XR) 500 MG 24 hr tablet Take 1 tablet (500 mg total) by mouth 2 (two) times daily with a meal. 180 tablet 1  . Multiple Vitamins-Minerals (MENS MULTIVITAMIN PLUS) TABS Take by mouth.    [name] Kitchen omeprazole (PRILOSEC) 20 MG capsule Take 1 capsule (20 mg total) by mouth daily. 90 capsule 1  . simvastatin (ZOCOR) 10 MG tablet Take 1 tablet (10 mg total) by mouth at bedtime. 90 tablet 1  . tamsulosin (FLOMAX) 0.4 MG CAPS capsule Take 0.4 mg by mouth every evening.     . vitamin C (ASCORBIC ACID) 500 MG tablet Take 500 mg by mouth daily.  No current facility-administered medications on file prior to visit.    Allergies  Allergen Reactions  . Albamycin [Novobiocin]     Made me look like a strawberry   . Shrimp [Shellfish Allergy] Itching    Unsure if allergic - has been eating fried fish  And shell fish since with no reactions  . Penicillins Rash    Family History  Problem Relation Age of Onset  . Heart disease Mother 45    Deceased  . Bladder Cancer Mother   . Prostate cancer Father     Deceased-in 45s  . Healthy Brother   . Skin cancer Brother     #2  . Asthma Sister     Deceased  . Healthy Son     #1  . Healthy Daughter     #2  . Obesity Son     #2    Social History   Social History  . Marital Status: Married    Spouse Name: N/A  . Number of Children: N/A  . Years of Education: N/A   Social History Main Topics  . Smoking status: Former Smoker    Types: Pipe    Quit date:  07/14/1978  . Smokeless tobacco: Never Used     Comment: quit smoking 1979  . Alcohol Use: Yes     Comment: OCCASIONAL  . Drug Use: No  . Sexual Activity: Yes   Other Topics Concern  . None   Social History Narrative    Review of Systems - See HPI.  All other ROS are negative.  BP 140/74 mmHg  Pulse 81  Temp(Src) 97.7 F (36.5 C) (Oral)  Ht 5\' 10"  (1.778 m)  Wt 225 lb 4 oz (102.173 kg)  BMI 32.32 kg/m2  SpO2 97%  Physical Exam  Constitutional: He is oriented to person, place, and time and well-developed, well-nourished, and in no distress.  HENT:  Head: Normocephalic and atraumatic.  Cardiovascular: Normal rate, regular rhythm, normal heart sounds and intact distal pulses.   Pulmonary/Chest: Effort normal.  Neurological: He is alert and oriented to person, place, and time.  Skin: Skin is warm and dry. No rash noted.  Vitals reviewed.   No results found for this or any previous visit (from the past 2160 hour(s)).  Assessment/Plan: Absence of bladder continence Referral placed to [facility] for second opinion, further assessment and treatment per patient request.

## 2016-01-07 DIAGNOSIS — R2689 Other abnormalities of gait and mobility: Secondary | ICD-10-CM | POA: Diagnosis not present

## 2016-01-07 DIAGNOSIS — H903 Sensorineural hearing loss, bilateral: Secondary | ICD-10-CM | POA: Diagnosis not present

## 2016-01-08 ENCOUNTER — Encounter: Payer: Self-pay | Admitting: Physician Assistant

## 2016-01-08 DIAGNOSIS — R2681 Unsteadiness on feet: Secondary | ICD-10-CM

## 2016-01-10 NOTE — Assessment & Plan Note (Signed)
Referral placed to [facility] for second opinion, further assessment and treatment per patient request.

## 2016-01-18 ENCOUNTER — Encounter: Payer: Self-pay | Admitting: Physician Assistant

## 2016-01-18 ENCOUNTER — Ambulatory Visit: Payer: Medicare Other | Attending: Physician Assistant | Admitting: Physical Therapy

## 2016-01-18 DIAGNOSIS — R29898 Other symptoms and signs involving the musculoskeletal system: Secondary | ICD-10-CM | POA: Insufficient documentation

## 2016-01-18 DIAGNOSIS — R262 Difficulty in walking, not elsewhere classified: Secondary | ICD-10-CM | POA: Diagnosis not present

## 2016-01-18 DIAGNOSIS — M25661 Stiffness of right knee, not elsewhere classified: Secondary | ICD-10-CM | POA: Diagnosis not present

## 2016-01-18 DIAGNOSIS — M25561 Pain in right knee: Secondary | ICD-10-CM | POA: Diagnosis not present

## 2016-01-18 DIAGNOSIS — R296 Repeated falls: Secondary | ICD-10-CM | POA: Insufficient documentation

## 2016-01-18 DIAGNOSIS — R269 Unspecified abnormalities of gait and mobility: Secondary | ICD-10-CM | POA: Insufficient documentation

## 2016-01-18 DIAGNOSIS — Z9181 History of falling: Secondary | ICD-10-CM

## 2016-01-18 NOTE — Therapy (Signed)
[facility] [address]  [place], [place], [address] Phone: [phone]   Fax:  [phone]  Physical Therapy Evaluation  Patient Details  Name: Matthew Nash MRN: [id number] Date of Birth: 1936-07-10 Referring Provider: Brunetta Jeans, MD  Encounter Date: 01/18/2016      PT End of Session - 01/18/16 1144    Visit Number 1   Number of Visits 16   Date for PT Re-Evaluation 03/14/16   PT Start Time 0945   PT Stop Time 1027   PT Time Calculation (min) 42 min   Activity Tolerance Patient tolerated treatment well   Behavior During Therapy [facility] for tasks assessed/performed      Past Medical History  Diagnosis Date  . BPH (benign prostatic hyperplasia)     hx s/p turp  . Hyperlipidemia     takes Simvastatin and Tricor daily-under control  . Joint pain     both knees  . Joint swelling     rt knee  . Chronic back pain     spondylosis  . Melanoma ([place])     right hand;basal cell carcinoma  . H/O hiatal hernia   . History of colon polyps   . Diverticulitis   . Neuropathy ([place])   . Prostate infection     currently taking macrobid  . Diarrhea     x 1 today  . History of chicken pox   . Urinary incontinence   . UTI (lower urinary tract infection)   . Back pain at L4-L5 level   . Arthritis     both knees  . DDD (degenerative disc disease), lumbar   . History of kidney stones   . Diabetes mellitus     takes Metformin and Victoza daily-under control  . Hepatitis     B-with Mono and jaundice 1960  . GERD (gastroesophageal reflux disease)     takes Omeprazole daily-under control  . Hypertension     takes Lisinopril daily-under control  . H/O measles   . Pneumonia     hx of  . Tinnitus     Past Surgical History  Procedure Laterality Date  . Shoulder arthroscopy w/ rotator cuff repair Left NOV 2010  . Transurethral resection of prostate  2006  . Cortisone injection    . I&d left knee Left 1958     states 22 times  . C5 and t1 bone chip removed   1986  . Cholecystectomy  1993  . Right shoulder surgery  2012    rotator cuff repair  . Colonoscopy  2011  . Lumbar laminectomy/decompression microdiscectomy Right 02/17/2014    Procedure: RIGHT LUMBAR TWO-THREE LAMINECTOMY;  Surgeon: Charlie Pitter, MD;  Location: [facility];  Service: Neurosurgery;  Laterality: Right;  right   . Knee arthroscopy Right 07/16/2014    Procedure: RIGHT ARTHROSCOPY KNEE WITH Medial and Lateral DEBRIDEMENT and chondroplasty;  Surgeon: Gearlean Alf, MD;  Location: [facility];  Service: Orthopedics;  Laterality: Right;  . Wisdom tooth extraction    . Basal cell carcinoma excision    . Vasectomy  1983  . Tonsillectomy  1942  . Cauterize inner nose  1957  . Total knee arthroplasty Right 05/27/2015    Procedure: RIGHT TOTAL KNEE ARTHROPLASTY;  Surgeon: Gaynelle Arabian, MD;  Location: [facility];  Service: Orthopedics;  Laterality: Right;    There were no vitals filed for this visit.  Visit Diagnosis:  Abnormality of gait - Plan:  PT plan of care cert/re-cert  Weakness of both lower extremities - Plan: PT plan of care cert/re-cert  At high risk for falls - Plan: PT plan of care cert/re-cert      Subjective Assessment - 01/18/16 0949    Subjective Pt is a 80 y/o male who presents to OPPT for gait instability and balance deficits.  Pt reports balance problems began in 2010, followed up with neurology which revealed peripheral neuropathy (possibly due to T2DM).  Pt saw orthopedic MD and referred to ENT for possible vestibular dysfunction (ruled out).  Pt reports one fall in Sept, but reports multiple near fall.  Pt states LOB posteriorly with difficulty on uneven terrain.   Pertinent History neuropathy, HLD, OA, DM, HTN, R TKA   Limitations Walking   Patient Stated Goals go fly fishing without needing assistance; walk without fear of falling   Currently in Pain? No/denies            [facility] PT Assessment - 01/18/16 0954     Assessment   Medical Diagnosis gait instability; neuropathy   Referring Provider Brunetta Jeans, MD   Onset Date/Surgical Date --  10 years   Next MD Visit PRN   Prior Therapy L TKA   Precautions   Precautions Fall   Restrictions   Weight Bearing Restrictions No   Balance Screen   Has the patient fallen in the past 6 months Yes   How many times? 1   Has the patient had a decrease in activity level because of a fear of falling?  No  modifications to activities   Is the patient reluctant to leave their home because of a fear of falling?  No   Home Ecologist residence   Living Arrangements Spouse/significant other   Available Help at Discharge Family   Type of [place] to enter   Entrance Stairs-Number of Steps 1   [place] One level   Prior Function   Level of Independence Independent   Vocation Retired   Biomedical scientist retired from [employer]   Overall Cognitive Status Within [place] for tasks assessed   Observation/Other Assessments   Focus on Therapeutic Outcomes ([place])  56 (44% limited; predicted 41% limited)   Strength   Overall Strength Comments bil dorsiflexion 3/5; L hip flex 3/5   Ambulation/Gait   Ambulation/Gait Yes   Ambulation/Gait Assistance 4: Min guard   Ambulation Distance (Feet) 70 Feet   Assistive device None   Gait Pattern Right foot flat;Left foot flat;Poor foot clearance - left;Poor foot clearance - right   Gait Comments pt amb with bil foot slap and decreased gastroc activation with toe off bil; pt also frequently reaching for UE support especially with static standing    Standardized Balance Assessment   Standardized Balance Assessment Berg Balance Test   Berg Balance Test   Sit to Stand Able to stand using hands after several tries   Standing Unsupported Able to stand 2 minutes with supervision    Sitting with Back Unsupported but Feet Supported on Floor or Stool Able to sit safely and securely 2 minutes   Stand to Sit Controls descent by using hands   Transfers Able to transfer with verbal cueing and /or supervision   Standing Unsupported with Eyes Closed Needs help to keep from falling   Standing Ubsupported with Feet Together Able  to place feet together independently and stand for 1 minute with supervision   From Standing, Reach Forward with Outstretched Arm Reaches forward but needs supervision   From Standing Position, Pick up Object from [place] to pick up shoe, needs supervision   From Standing Position, Turn to Look Behind Over each Shoulder Needs supervision when turning   Turn 360 Degrees Needs close supervision or verbal cueing   Standing Unsupported, Alternately Place Feet on Step/Stool Able to complete >2 steps/needs minimal assist   Standing Unsupported, One Foot in [place] to plae foot ahead of the other independently and hold 30 seconds   Standing on One Leg Tries to lift leg/unable to hold 3 seconds but remains standing independently   Total Score 28                             PT Short Term Goals - 01/18/16 1149    PT SHORT TERM GOAL #1   Title independent with HEP (02/15/16)   Time 4   Period Weeks   Status New   PT SHORT TERM GOAL #2   Title improve BERG balance score to >/= 36/56 for improved balance and decreased fall risk (02/15/16)   Time 4   Period Weeks   Status New   PT SHORT TERM GOAL #3   Title assess benefit of AFO and initiate consult if appropriate (02/15/16)   Time 4   Period Weeks   Status New   PT SHORT TERM GOAL #4   Title assess gait velocity and timed up and go with LTGs to be written (02/15/16)   Time 4   Period Weeks   Status New           PT Long Term Goals - 01/18/16 1154    PT LONG TERM GOAL #1   Title verbalize understanding of fall prevention strategies (03/14/16)   Time 8   Period Weeks   Status New    PT LONG TERM GOAL #2   Title improve BERG balance score to >/= 40/56 for improved balance (03/14/16)   Time 8   Period Weeks   Status New   PT LONG TERM GOAL #3   Title timed up and go:     (03/14/16)   Time 8   Period Weeks   Status New   PT LONG TERM GOAL #4   Title gait velocity:       (03/14/16)   Time 8   Period Weeks   Status New               Plan - 01/18/16 1144    Clinical Impression Statement Pt is a 80 y/o male who presents to OPPT for a moderate complexity evaluation of gait instability and balance deficits.  Pt is at a high risk for falls mostly due to neuropathy due to diabetes.  Pt also demonstrates gait abnormalities and decreased strength affecting safe functional mobility.  Pt will benefit from PT to maximize function and decrease fall risk.  Recommended use of cane/walking stick at all times as pt frequently reaching for UE support in clinic.   Pt will benefit from skilled therapeutic intervention in order to improve on the following deficits Abnormal gait;Postural dysfunction;Decreased mobility;Decreased strength;Decreased knowledge of use of DME;Decreased balance;Impaired sensation   Rehab Potential Good   PT Frequency 2x / week   PT Duration 8 weeks   PT Treatment/Interventions ADLs/Self Care Home Management;Moist  Heat;Cryotherapy;Therapeutic activities;Therapeutic exercise;Functional mobility training;Stair training;Gait training;Orthotic Fit/Training;Vestibular   PT Next Visit Plan timed up and go; gait velocity; issue HEP for balance; strengthening   Consulted and Agree with Plan of Care Patient          G-Codes - Jan 26, 2016 1157    Functional Assessment Tool Used BERG 28/56   Functional Limitation Mobility: Walking and moving around   Mobility: Walking and Moving Around Current Status [id number]) At least 40 percent but less than 60 percent impaired, limited or restricted   Mobility: Walking and Moving Around Goal Status [phone]) At least 20 percent but less  than 40 percent impaired, limited or restricted       Problem List Patient Active Problem List   Diagnosis Date Noted  . OA (osteoarthritis) of knee 05/27/2015  . Diabetes mellitus type II, controlled ([place]) 11/26/2014  . Gastroesophageal reflux disease without esophagitis 11/26/2014  . Arthritis of both knees 11/26/2014  . Absence of bladder continence 11/26/2014  . Hyperlipidemia LDL goal <100 11/26/2014  . Acute medial meniscal tear 07/16/2014  . Lumbosacral spondylosis without myelopathy 02/17/2014  . Spondylosis, lumbosacral 02/17/2014   Laureen Abrahams, PT, DPT 01-26-2016 11:59 AM  [facility] [address]  [place], [place], [address] Phone: [phone]   Fax:  [phone]  Name: Matthew Nash MRN: [id number] Date of Birth: 1936-07-01

## 2016-01-21 ENCOUNTER — Ambulatory Visit: Payer: Medicare Other

## 2016-01-21 DIAGNOSIS — R262 Difficulty in walking, not elsewhere classified: Secondary | ICD-10-CM | POA: Diagnosis not present

## 2016-01-21 DIAGNOSIS — R29898 Other symptoms and signs involving the musculoskeletal system: Secondary | ICD-10-CM

## 2016-01-21 DIAGNOSIS — M25561 Pain in right knee: Secondary | ICD-10-CM | POA: Diagnosis not present

## 2016-01-21 DIAGNOSIS — Z9181 History of falling: Secondary | ICD-10-CM

## 2016-01-21 DIAGNOSIS — R269 Unspecified abnormalities of gait and mobility: Secondary | ICD-10-CM

## 2016-01-21 DIAGNOSIS — R296 Repeated falls: Secondary | ICD-10-CM | POA: Diagnosis not present

## 2016-01-21 DIAGNOSIS — M25661 Stiffness of right knee, not elsewhere classified: Secondary | ICD-10-CM | POA: Diagnosis not present

## 2016-01-21 NOTE — Therapy (Deleted)
[facility] [address]  [place], [place], [address] Phone: [phone]   Fax:  [phone]  Physical Therapy Treatment  Patient Details  Name: Matthew Nash MRN: [id number] Date of Birth: [birth year] Referring Provider: Brunetta Jeans, MD  Encounter Date: 01/21/2016      PT End of Session - 01/21/16 1106    Visit Number 2   Number of Visits 16   Date for PT Re-Evaluation 03/14/16   PT Start Time 1103   PT Stop Time 1145   PT Time Calculation (min) 42 min   Activity Tolerance Patient tolerated treatment well   Behavior During Therapy [facility] for tasks assessed/performed      Past Medical History  Diagnosis Date  . BPH (benign prostatic hyperplasia)     hx s/p turp  . Hyperlipidemia     takes Simvastatin and Tricor daily-under control  . Joint pain     both knees  . Joint swelling     rt knee  . Chronic back pain     spondylosis  . Melanoma ([place])     right hand;basal cell carcinoma  . H/O hiatal hernia   . History of colon polyps   . Diverticulitis   . Neuropathy ([place])   . Prostate infection     currently taking macrobid  . Diarrhea     x 1 today  . History of chicken pox   . Urinary incontinence   . UTI (lower urinary tract infection)   . Back pain at L4-L5 level   . Arthritis     both knees  . DDD (degenerative disc disease), lumbar   . History of kidney stones   . Diabetes mellitus     takes Metformin and Victoza daily-under control  . Hepatitis     B-with Mono and jaundice 1960  . GERD (gastroesophageal reflux disease)     takes Omeprazole daily-under control  . Hypertension     takes Lisinopril daily-under control  . H/O measles   . Pneumonia     hx of  . Tinnitus     Past Surgical History  Procedure Laterality Date  . Shoulder arthroscopy w/ rotator cuff repair Left NOV 2010  . Transurethral resection of prostate  2006  . Cortisone injection    . I&d left knee Left 1958   states 22 times  . C5 and t1 bone chip removed   1986  . Cholecystectomy  1993  . Right shoulder surgery  2012    rotator cuff repair  . Colonoscopy  2011  . Lumbar laminectomy/decompression microdiscectomy Right 02/17/2014    Procedure: RIGHT LUMBAR TWO-THREE LAMINECTOMY;  Surgeon: Charlie Pitter, MD;  Location: [facility];  Service: Neurosurgery;  Laterality: Right;  right   . Knee arthroscopy Right 07/16/2014    Procedure: RIGHT ARTHROSCOPY KNEE WITH Medial and Lateral DEBRIDEMENT and chondroplasty;  Surgeon: Gearlean Alf, MD;  Location: [facility];  Service: Orthopedics;  Laterality: Right;  . Wisdom tooth extraction    . Basal cell carcinoma excision    . Vasectomy  1983  . Tonsillectomy  1942  . Cauterize inner nose  1957  . Total knee arthroplasty Right 05/27/2015    Procedure: RIGHT TOTAL KNEE ARTHROPLASTY;  Surgeon: Gaynelle Arabian, MD;  [facility]: [facility];  Service: Orthopedics;  Laterality: Right;    There were no vitals filed for this visit.  Visit Diagnosis:  Abnormality of gait  Weakness of both  lower extremities  At high risk for falls  Difficulty walking      Subjective Assessment - 01/21/16 1321    Subjective No pain, falls, or other complaints reported.     Patient Stated Goals go fly fishing without needing assistance; walk without fear of falling   Currently in Pain? No/denies   Multiple Pain Sites No     Today's Treatment: Neuro re-education:  - Corner balance on blue foam: wide stance, feet together, staggered stance; all stance performed with head turns R<>L, Up<>down, diagonals x 30 sec each  - TRX mini squats 2 x 10 reps  - alternating toe touch on 6" step with cross over x 10 each; minor LOB x 2 with self-recovery  - Alternating toe-touch to orange rubber ball over 2" half bolster  X 10 each; minor LOB x 2 with self-recovery  - High-knee march holding onto counter x 4 laps down / back  Therex:   - B hip abduction / extension 2 x 10 reps next to bar;  verbal cues provided for technique       PT Short Term Goals - 01/18/16 1149    PT SHORT TERM GOAL #1   Title independent with HEP (02/15/16)   Time 4   Period Weeks   Status New   PT SHORT TERM GOAL #2   Title improve BERG balance score to >/= 36/56 for improved balance and decreased fall risk (02/15/16)   Time 4   Period Weeks   Status New   PT SHORT TERM GOAL #3   Title assess benefit of AFO and initiate consult if appropriate (02/15/16)   Time 4   Period Weeks   Status New   PT SHORT TERM GOAL #4   Title assess gait velocity and timed up and go with LTGs to be written (02/15/16)   Time 4   Period Weeks   Status New           PT Long Term Goals - 01/18/16 1154    PT LONG TERM GOAL #1   Title verbalize understanding of fall prevention strategies (03/14/16)   Time 8   Period Weeks   Status New   PT LONG TERM GOAL #2   Title improve BERG balance score to >/= 40/56 for improved balance (03/14/16)   Time 8   Period Weeks   Status New   PT LONG TERM GOAL #3   Title timed up and go:     (03/14/16)   Time 8   Period Weeks   Status New   PT LONG TERM GOAL #4   Title gait velocity:       (03/14/16)   Time 8   Period Weeks   Status New               Plan - 01/21/16 1341    Clinical Impression Statement Pt. tolerated balance and coordination activities well in todays session however continues to demo minor LOB throughout standing and dynamic therex; pt. able to self correct on level surface.  Bilateral foot slap, poor LE clearance and shortened step length continues with gait.  Today's  session focused on alternating toe-touch to elevated surfaces to enhance bilateal hip / knee flexion to improve LE clearance. Single leg stance hip strengthening also a focus in todays treatment.  Pt. would benefit from further skilled instruction with gait training and hip stability exercise to improve balance.       Pt will benefit from skilled therapeutic  intervention in order to improve on  the following deficits Abnormal gait;Postural dysfunction;Decreased mobility;Decreased strength;Decreased knowledge of use of DME;Decreased balance;Impaired sensation   Rehab Potential Good   PT Frequency 2x / week   PT Duration 8 weeks   PT Treatment/Interventions ADLs/Self Care Home Management;Moist Heat;Cryotherapy;Therapeutic activities;Therapeutic exercise;Functional mobility training;Stair training;Gait training;Orthotic Fit/Training;Vestibular   PT Next Visit Plan Issue HEP for balance; strengthening.  Continue with activities to promote increased hip / knee flexion, DF, to improve LE clearance.     Consulted and Agree with Plan of Care Patient        Problem List Patient Active Problem List   Diagnosis Date Noted  . OA (osteoarthritis) of knee 05/27/2015  . Diabetes mellitus type II, controlled ([place]) 11/26/2014  . Gastroesophageal reflux disease without esophagitis 11/26/2014  . Arthritis of both knees 11/26/2014  . Absence of bladder continence 11/26/2014  . Hyperlipidemia LDL goal <100 11/26/2014  . Acute medial meniscal tear 07/16/2014  . Lumbosacral spondylosis without myelopathy 02/17/2014  . Spondylosis, lumbosacral 02/17/2014    Bess Harvest, PTA 01/21/2016, 1:58 PM  [facility] [address]  [place], [place], [address] Phone: [phone]   Fax:  [phone]  Name: EMEKA ROUCH MRN: [id number] Date of Birth: 01/25/1936

## 2016-01-21 NOTE — Therapy (Signed)
[facility] [address]  [place], [place], [address] Phone: [phone]   Fax:  [phone]  Physical Therapy Treatment  Patient Details  Name: Matthew Nash MRN: [id number] Date of Birth: 12-10-1936 Referring Provider: Brunetta Jeans, MD  Encounter Date: 01/21/2016      PT End of Session - 01/21/16 1106    Visit Number 2   Number of Visits 16   Date for PT Re-Evaluation 03/14/16   PT Start Time 1103   PT Stop Time 1145   PT Time Calculation (min) 42 min   Activity Tolerance Patient tolerated treatment well   Behavior During Therapy [facility] for tasks assessed/performed      Past Medical History  Diagnosis Date  . BPH (benign prostatic hyperplasia)     hx s/p turp  . Hyperlipidemia     takes Simvastatin and Tricor daily-under control  . Joint pain     both knees  . Joint swelling     rt knee  . Chronic back pain     spondylosis  . Melanoma ([place])     right hand;basal cell carcinoma  . H/O hiatal hernia   . History of colon polyps   . Diverticulitis   . Neuropathy ([place])   . Prostate infection     currently taking macrobid  . Diarrhea     x 1 today  . History of chicken pox   . Urinary incontinence   . UTI (lower urinary tract infection)   . Back pain at L4-L5 level   . Arthritis     both knees  . DDD (degenerative disc disease), lumbar   . History of kidney stones   . Diabetes mellitus     takes Metformin and Victoza daily-under control  . Hepatitis     B-with Mono and jaundice 1960  . GERD (gastroesophageal reflux disease)     takes Omeprazole daily-under control  . Hypertension     takes Lisinopril daily-under control  . H/O measles   . Pneumonia     hx of  . Tinnitus     Past Surgical History  Procedure Laterality Date  . Shoulder arthroscopy w/ rotator cuff repair Left NOV 2010  . Transurethral resection of prostate  2006  . Cortisone injection    . I&d left knee Left 1958   states 22 times  . C5 and t1 bone chip removed   1986  . Cholecystectomy  1993  . Right shoulder surgery  2012    rotator cuff repair  . Colonoscopy  2011  . Lumbar laminectomy/decompression microdiscectomy Right 02/17/2014    Procedure: RIGHT LUMBAR TWO-THREE LAMINECTOMY;  Surgeon: Charlie Pitter, MD;  Location: [place] [facility];  Service: Neurosurgery;  Laterality: Right;  right   . Knee arthroscopy Right 07/16/2014    Procedure: RIGHT ARTHROSCOPY KNEE WITH Medial and Lateral DEBRIDEMENT and chondroplasty;  Surgeon: Gearlean Alf, MD;  [facility]: [facility];  Service: Orthopedics;  Laterality: Right;  . Wisdom tooth extraction    . Basal cell carcinoma excision    . Vasectomy  1983  . Tonsillectomy  1942  . Cauterize inner nose  1957  . Total knee arthroplasty Right 05/27/2015    Procedure: RIGHT TOTAL KNEE ARTHROPLASTY;  Surgeon: Gaynelle Arabian, MD;  [facility];  Service: Orthopedics;  Laterality: Right;    There were no vitals filed for this visit.  Visit Diagnosis:  Abnormality of gait  Weakness of both  lower extremities  At high risk for falls  Difficulty walking      Subjective Assessment - 01/21/16 1321    Subjective No pain, falls, or other complaints reported.     Patient Stated Goals go fly fishing without needing assistance; walk without fear of falling   Currently in Pain? No/denies   Multiple Pain Sites No            [facility] PT Assessment - 01/21/16 1334    Timed Up and Go Test   TUG Normal TUG   Normal TUG (seconds) 10.66  ambulated with single point cane               [facility] Adult PT Treatment/Exercise - 01/21/16 1336    Ambulation/Gait   Ambulation/Gait Yes   Ambulation/Gait Assistance 4: Min guard   Ambulation Distance (Feet) 80 Feet   Assistive device Straight cane   Gait Pattern Right foot flat;Left foot flat;Poor foot clearance - left;Poor foot clearance - right   Ambulation Surface Level;Indoor   Gait velocity 3.60 ft/sec   Stairs No   Door  Management Not tested (comment)   Ramp Not tested (comment)   Curb Not tested (comment)   Pre-Gait Activities alternating toe-touch on 8" step with hip / knee flexion, LE clearance as focus    Gait Comments Pt. contionues to reach with UE for stability while ambulating with single point cane.  Bilateral foot slap continues.  poor LE clearance.        Today's Treatment: Neuro re-education:  - Corner balance on blue foam: wide stance, feet together, staggered stance; all stance performed with head turns R<>L, Up<>down, diagonals x 30 sec each  - TRX mini squats 2 x 10 reps  - alternating toe touch on 6" step with cross over x 10 each; minor LOB x 2 with self-recovery  - Alternating toe-touch to orange rubber ball over 2" half bolster  X 10 each; minor LOB x 2 with self-recovery  - High-knee march holding onto counter x 4 laps down / back  Therex:  - B hip abduction / extension 2 x 10 reps next to bar; verbal cues provided for technique       PT Short Term Goals - 01/18/16 1149    PT SHORT TERM GOAL #1   Title independent with HEP (02/15/16)   Time 4   Period Weeks   Status New   PT SHORT TERM GOAL #2   Title improve BERG balance score to >/= 36/56 for improved balance and decreased fall risk (02/15/16)   Time 4   Period Weeks   Status New   PT SHORT TERM GOAL #3   Title assess benefit of AFO and initiate consult if appropriate (02/15/16)   Time 4   Period Weeks   Status New   PT SHORT TERM GOAL #4   Title assess gait velocity and timed up and go with LTGs to be written (02/15/16)   Time 4   Period Weeks   Status New           PT Long Term Goals - 01/18/16 1154    PT LONG TERM GOAL #1   Title verbalize understanding of fall prevention strategies (03/14/16)   Time 8   Period Weeks   Status New   PT LONG TERM GOAL #2   Title improve BERG balance score to >/= 40/56 for improved balance (03/14/16)   Time 8   Period Weeks   Status New  PT LONG TERM GOAL #3   Title timed  up and go:     (03/14/16)   Time 8   Period Weeks   Status New   PT LONG TERM GOAL #4   Title gait velocity:       (03/14/16)   Time 8   Period Weeks   Status New               Plan - 01/21/16 1341    Clinical Impression Statement Pt. tolerated balance and coordination activities well in todays session however continues to demo minor LOB throughout standing and dynamic therex; pt. able to self correct on level surface.  Bilateral foot slap, poor LE clearance and shortened step length continues with gait.  Today's  session focused on alternating toe-touch to elevated surfaces to enhance bilateal hip / knee flexion to improve LE clearance. Single leg stance hip strengthening also a focus in todays treatment.  Pt. would benefit from further skilled instruction with gait training and hip stability exercise to improve balance.       Pt will benefit from skilled therapeutic intervention in order to improve on the following deficits Abnormal gait;Postural dysfunction;Decreased mobility;Decreased strength;Decreased knowledge of use of DME;Decreased balance;Impaired sensation   Rehab Potential Good   PT Frequency 2x / week   PT Duration 8 weeks   PT Treatment/Interventions ADLs/Self Care Home Management;Moist Heat;Cryotherapy;Therapeutic activities;Therapeutic exercise;Functional mobility training;Stair training;Gait training;Orthotic Fit/Training;Vestibular   PT Next Visit Plan Issue HEP for balance; strengthening.  Continue with activities to promote increased hip / knee flexion, DF, to improve LE clearance.     Consulted and Agree with Plan of Care Patient        Problem List Patient Active Problem List   Diagnosis Date Noted  . OA (osteoarthritis) of knee 05/27/2015  . Diabetes mellitus type II, controlled ([place]) 11/26/2014  . Gastroesophageal reflux disease without esophagitis 11/26/2014  . Arthritis of both knees 11/26/2014  . Absence of bladder continence 11/26/2014  .  Hyperlipidemia LDL goal <100 11/26/2014  . Acute medial meniscal tear 07/16/2014  . Lumbosacral spondylosis without myelopathy 02/17/2014  . Spondylosis, lumbosacral 02/17/2014    Bess Harvest 01/21/2016, 2:04 PM  [facility] [address]  [place], [place], [address] Phone: [phone]   Fax:  [phone]  Name: Matthew Nash MRN: [id number] Date of Birth: July 02, 1936

## 2016-01-22 DIAGNOSIS — M1712 Unilateral primary osteoarthritis, left knee: Secondary | ICD-10-CM | POA: Diagnosis not present

## 2016-01-25 ENCOUNTER — Telehealth: Payer: Self-pay | Admitting: *Deleted

## 2016-01-25 ENCOUNTER — Ambulatory Visit: Payer: Medicare Other | Admitting: Physical Therapy

## 2016-01-25 DIAGNOSIS — R29898 Other symptoms and signs involving the musculoskeletal system: Secondary | ICD-10-CM

## 2016-01-25 DIAGNOSIS — M25561 Pain in right knee: Secondary | ICD-10-CM

## 2016-01-25 DIAGNOSIS — R269 Unspecified abnormalities of gait and mobility: Secondary | ICD-10-CM | POA: Diagnosis not present

## 2016-01-25 DIAGNOSIS — R296 Repeated falls: Secondary | ICD-10-CM | POA: Diagnosis not present

## 2016-01-25 DIAGNOSIS — M25661 Stiffness of right knee, not elsewhere classified: Secondary | ICD-10-CM

## 2016-01-25 DIAGNOSIS — Z9181 History of falling: Secondary | ICD-10-CM

## 2016-01-25 DIAGNOSIS — R262 Difficulty in walking, not elsewhere classified: Secondary | ICD-10-CM | POA: Diagnosis not present

## 2016-01-25 NOTE — Telephone Encounter (Signed)
Received fax from [facility], pt has surgery scheduled for 05/30/16 with Dr. Wynelle Link [TKR]; requesting Surgical Clearance appointment with PCP. Patient had last Medicare Wellness on 03/20/15, would you like me to have him scheduled for both in April 2017, and/or would you prefer that the surgical clearance appt be scheduled closer to the Sx date in June. Please Advise/SLS 02/13

## 2016-01-25 NOTE — Telephone Encounter (Signed)
I would have him do the surgical clearance 30 days prior to scheduled surgery.

## 2016-01-25 NOTE — Therapy (Signed)
[facility] [address]  [place], [place], [address] Phone: [phone]   Fax:  [phone]  Physical Therapy Treatment  Patient Details  Name: Matthew Nash MRN: [id number] Date of Birth: [birth year] Referring Provider: Brunetta Jeans, MD  Encounter Date: 01/25/2016      PT End of Session - 01/25/16 1404    Visit Number 3   Number of Visits 16   Date for PT Re-Evaluation 03/14/16   PT Start Time [id number]   PT Stop Time 1342   PT Time Calculation (min) 43 min   Activity Tolerance Patient tolerated treatment well   Behavior During Therapy [facility] for tasks assessed/performed      Past Medical History  Diagnosis Date  . BPH (benign prostatic hyperplasia)     hx s/p turp  . Hyperlipidemia     takes Simvastatin and Tricor daily-under control  . Joint pain     both knees  . Joint swelling     rt knee  . Chronic back pain     spondylosis  . Melanoma ([place])     right hand;basal cell carcinoma  . H/O hiatal hernia   . History of colon polyps   . Diverticulitis   . Neuropathy ([place])   . Prostate infection     currently taking macrobid  . Diarrhea     x 1 today  . History of chicken pox   . Urinary incontinence   . UTI (lower urinary tract infection)   . Back pain at L4-L5 level   . Arthritis     both knees  . DDD (degenerative disc disease), lumbar   . History of kidney stones   . Diabetes mellitus     takes Metformin and Victoza daily-under control  . Hepatitis     B-with Mono and jaundice 1960  . GERD (gastroesophageal reflux disease)     takes Omeprazole daily-under control  . Hypertension     takes Lisinopril daily-under control  . H/O measles   . Pneumonia     hx of  . Tinnitus     Past Surgical History  Procedure Laterality Date  . Shoulder arthroscopy w/ rotator cuff repair Left NOV 2010  . Transurethral resection of prostate  2006  . Cortisone injection    . I&d left knee Left 1958   states 22 times  . C5 and t1 bone chip removed   1986  . Cholecystectomy  1993  . Right shoulder surgery  2012    rotator cuff repair  . Colonoscopy  2011  . Lumbar laminectomy/decompression microdiscectomy Right 02/17/2014    Procedure: RIGHT LUMBAR TWO-THREE LAMINECTOMY;  Surgeon: Charlie Pitter, MD;  Location: [facility];  Service: Neurosurgery;  Laterality: Right;  right   . Knee arthroscopy Right 07/16/2014    Procedure: RIGHT ARTHROSCOPY KNEE WITH Medial and Lateral DEBRIDEMENT and chondroplasty;  Surgeon: Gearlean Alf, MD;  Location: [facility];  Service: Orthopedics;  Laterality: Right;  . Wisdom tooth extraction    . Basal cell carcinoma excision    . Vasectomy  1983  . Tonsillectomy  1942  . Cauterize inner nose  1957  . Total knee arthroplasty Right 05/27/2015    Procedure: RIGHT TOTAL KNEE ARTHROPLASTY;  Surgeon: Gaynelle Arabian, MD;  [facility];  Service: Orthopedics;  Laterality: Right;    There were no vitals filed for this visit.  Visit Diagnosis:  Abnormality of gait  Weakness of both  lower extremities  At high risk for falls  Difficulty walking  Knee stiffness, right  Right knee pain      Subjective Assessment - 01/25/16 1300    Subjective L knee is hurting today; scheduled for TKA in June.     Pertinent History neuropathy, HLD, OA, DM, HTN, R TKA   Limitations Walking   Patient Stated Goals go fly fishing without needing assistance; walk without fear of falling   Currently in Pain? Yes   Pain Score 5   "0.14 sitting"   Pain Location Knee   Pain Orientation Left   Pain Descriptors / Indicators Aching;Sharp   Pain Type Chronic pain   Pain Onset More than a month ago   Pain Frequency Intermittent   Aggravating Factors  walking   Pain Relieving Factors sitting, rest                         [facility] Adult PT Treatment/Exercise - 01/25/16 1335    Ambulation/Gait   Gait Comments Trialed gait with bil AFOs including the [place] Walk ON  PLS (trimable) and Walk On Reaction.  Pt without significant gait changes with Reaction but PLS decreased foot slap and improved stability without LOB.  Pt states improved comfort with PLS AFOs and pt demonstrated improved mechanics and decreased foot slap.  Performed gait with AFOs including head turns and side stepping with minguard A.   Exercises   Exercises Knee/Hip   Knee/Hip Exercises: Standing   Hip Flexion Both;10 reps;Knee straight   Hip Flexion Limitations UE support   Hip Abduction Both;10 reps;Knee straight   Abduction Limitations UE support   Hip Extension Both;10 reps   Extension Limitations UE support                  PT Short Term Goals - 01/18/16 1149    PT SHORT TERM GOAL #1   Title independent with HEP (02/15/16)   Time 4   Period Weeks   Status New   PT SHORT TERM GOAL #2   Title improve BERG balance score to >/= 36/56 for improved balance and decreased fall risk (02/15/16)   Time 4   Period Weeks   Status New   PT SHORT TERM GOAL #3   Title assess benefit of AFO and initiate consult if appropriate (02/15/16)   Time 4   Period Weeks   Status New   PT SHORT TERM GOAL #4   Title assess gait velocity and timed up and go with LTGs to be written (02/15/16)   Time 4   Period Weeks   Status New           PT Long Term Goals - 01/25/16 1405    PT LONG TERM GOAL #1   Title verbalize understanding of fall prevention strategies (03/14/16)   Status On-going   PT LONG TERM GOAL #2   Title improve BERG balance score to >/= 40/56 for improved balance (03/14/16)   Status On-going   PT LONG TERM GOAL #3   Title timed up and go:  n/a   (03/14/16)   Status Achieved   PT LONG TERM GOAL #4   Title gait velocity:   n/a    (03/14/16)   Status Achieved               Plan - 01/25/16 1404    Clinical Impression Statement Pt demonstrated improved foot clearance and decreased foot slap bil with Bil PLS  AFOs.  Feel pt would benefit from bil AFOs to decrease fall risk  and improve mobility.     PT Next Visit Plan Issue HEP for balance; strengthening.  Continue with activities to promote increased hip / knee flexion, DF, to improve LE clearance.     Consulted and Agree with Plan of Care Patient        Problem List Patient Active Problem List   Diagnosis Date Noted  . OA (osteoarthritis) of knee 05/27/2015  . Diabetes mellitus type II, controlled ([place]) 11/26/2014  . Gastroesophageal reflux disease without esophagitis 11/26/2014  . Arthritis of both knees 11/26/2014  . Absence of bladder continence 11/26/2014  . Hyperlipidemia LDL goal <100 11/26/2014  . Acute medial meniscal tear 07/16/2014  . Lumbosacral spondylosis without myelopathy 02/17/2014  . Spondylosis, lumbosacral 02/17/2014   Laureen Abrahams, PT, DPT 01/25/2016 2:08 PM  [facility] [address]  [place], [place], [address] Phone: [phone]   Fax:  [phone]  Name: Matthew Nash MRN: [id number] Date of Birth: 12-09-1936

## 2016-01-26 NOTE — Telephone Encounter (Signed)
Please call patient and schedule 30-minute appt for Surgical/Medical Clearance approximately 1 month prior to surgery date of 05/30/16//SLS 02/14 Thanks.

## 2016-01-28 ENCOUNTER — Ambulatory Visit: Payer: Medicare Other | Admitting: Physical Therapy

## 2016-01-28 DIAGNOSIS — R262 Difficulty in walking, not elsewhere classified: Secondary | ICD-10-CM | POA: Diagnosis not present

## 2016-01-28 DIAGNOSIS — M25561 Pain in right knee: Secondary | ICD-10-CM

## 2016-01-28 DIAGNOSIS — R269 Unspecified abnormalities of gait and mobility: Secondary | ICD-10-CM

## 2016-01-28 DIAGNOSIS — M25661 Stiffness of right knee, not elsewhere classified: Secondary | ICD-10-CM | POA: Diagnosis not present

## 2016-01-28 DIAGNOSIS — R296 Repeated falls: Secondary | ICD-10-CM | POA: Diagnosis not present

## 2016-01-28 DIAGNOSIS — R29898 Other symptoms and signs involving the musculoskeletal system: Secondary | ICD-10-CM

## 2016-01-28 DIAGNOSIS — Z9181 History of falling: Secondary | ICD-10-CM

## 2016-01-28 NOTE — Patient Instructions (Signed)
Feet Apart (Compliant Surface) Varied Arm Positions - Eyes Closed    Stand on compliant surface: _pillow__ with feet shoulder width apart and arms out. Close eyes and stand still. Hold__10__ seconds. Repeat __5__ times per session. Do __2-3__ sessions per day.  Copyright  VHI. All rights reserved.    Feet Together (Compliant Surface) Head Motion - Eyes Open    With eyes open, standing on compliant surface: _pillow_, feet together, move head slowly: up and down.  Repeat moving head slowly side to side. Repeat _10___ times per session. Do __2-3__ sessions per day.  Copyright  VHI. All rights reserved.    Toe Up    Slowly lift heels. Repeat _10_ times. Do _2-3___ sessions per day.  http://gt2.exer.us/455   Copyright  VHI. All rights reserved.    Plantar Flexion: Resisted    Anchor behind, tubing around left foot, press down.   Repeat _10___ times per set. Do __1_ sets per session. Do __2-3__ sessions per day. Repeat with other foot. http://orth.exer.us/10   Copyright  VHI. All rights reserved.

## 2016-01-28 NOTE — Therapy (Signed)
[facility] [address]  [place], [place], [address] Phone: [phone]   Fax:  [phone]  Physical Therapy Treatment  Patient Details  Name: Matthew Nash MRN: [id number] Date of Birth: Jun 16, 1936 Referring Provider: Brunetta Jeans, MD  Encounter Date: 01/28/2016      PT End of Session - 01/28/16 1458    Visit Number 4   Number of Visits 16   Date for PT Re-Evaluation 03/14/16   PT Start Time 1304   PT Stop Time 1342   PT Time Calculation (min) 38 min   Activity Tolerance Patient tolerated treatment well   Behavior During Therapy [facility] for tasks assessed/performed      Past Medical History  Diagnosis Date  . BPH (benign prostatic hyperplasia)     hx s/p turp  . Hyperlipidemia     takes Simvastatin and Tricor daily-under control  . Joint pain     both knees  . Joint swelling     rt knee  . Chronic back pain     spondylosis  . Melanoma ([place])     right hand;basal cell carcinoma  . H/O hiatal hernia   . History of colon polyps   . Diverticulitis   . Neuropathy ([place])   . Prostate infection     currently taking macrobid  . Diarrhea     x 1 today  . History of chicken pox   . Urinary incontinence   . UTI (lower urinary tract infection)   . Back pain at L4-L5 level   . Arthritis     both knees  . DDD (degenerative disc disease), lumbar   . History of kidney stones   . Diabetes mellitus     takes Metformin and Victoza daily-under control  . Hepatitis     B-with Mono and jaundice 1960  . GERD (gastroesophageal reflux disease)     takes Omeprazole daily-under control  . Hypertension     takes Lisinopril daily-under control  . H/O measles   . Pneumonia     hx of  . Tinnitus     Past Surgical History  Procedure Laterality Date  . Shoulder arthroscopy w/ rotator cuff repair Left NOV 2010  . Transurethral resection of prostate  2006  . Cortisone injection    . I&d left knee Left 1958   states 22 times  . C5 and t1 bone chip removed   1986  . Cholecystectomy  1993  . Right shoulder surgery  2012    rotator cuff repair  . Colonoscopy  2011  . Lumbar laminectomy/decompression microdiscectomy Right 02/17/2014    Procedure: RIGHT LUMBAR TWO-THREE LAMINECTOMY;  Surgeon: Charlie Pitter, MD;  Location: [facility];  Service: Neurosurgery;  Laterality: Right;  right   . Knee arthroscopy Right 07/16/2014    Procedure: RIGHT ARTHROSCOPY KNEE WITH Medial and Lateral DEBRIDEMENT and chondroplasty;  Surgeon: Gearlean Alf, MD;  Location: [facility];  Service: Orthopedics;  Laterality: Right;  . Wisdom tooth extraction    . Basal cell carcinoma excision    . Vasectomy  1983  . Tonsillectomy  1942  . Cauterize inner nose  1957  . Total knee arthroplasty Right 05/27/2015    Procedure: RIGHT TOTAL KNEE ARTHROPLASTY;  Surgeon: Gaynelle Arabian, MD;  Location: [facility];  Service: Orthopedics;  Laterality: Right;    There were no vitals filed for this visit.  Visit Diagnosis:  Abnormality of gait  Weakness of both  lower extremities  At high risk for falls  Difficulty walking  Knee stiffness, right  Right knee pain      Subjective Assessment - 01/28/16 1259    Subjective has an upset stomach since this AM   Patient Stated Goals go fly fishing without needing assistance; walk without fear of falling   Currently in Pain? Yes  c/o abdominal discomfort                         [facility] Adult PT Treatment/Exercise - 01/28/16 1325    Exercises   Exercises Ankle   Knee/Hip Exercises: Seated   Sit to Sand 10 reps;without UE support  min A   Ankle Exercises: Seated   Toe Raise 10 reps   Toe Raise Limitations bil   Other Seated Ankle Exercises plantarflexion and inversion yellow tband x 10 bil         On compliant surface: feet apart EC 5x10 sec; EO with horizontal/vertical head turns x 10; feet together with EO horizontal/vertical head turns x10  Forward step ups onto  4" step x 10 bil and 1 UE support        PT Education - 01/28/16 1458    Education provided Yes   Education Details HEP   Person(s) Educated Patient   Methods Explanation;Demonstration;Handout   Comprehension Verbalized understanding;Returned demonstration;Need further instruction          PT Short Term Goals - 01/18/16 1149    PT SHORT TERM GOAL #1   Title independent with HEP (02/15/16)   Time 4   Period Weeks   Status New   PT SHORT TERM GOAL #2   Title improve BERG balance score to >/= 36/56 for improved balance and decreased fall risk (02/15/16)   Time 4   Period Weeks   Status New   PT SHORT TERM GOAL #3   Title assess benefit of AFO and initiate consult if appropriate (02/15/16)   Time 4   Period Weeks   Status New   PT SHORT TERM GOAL #4   Title assess gait velocity and timed up and go with LTGs to be written (02/15/16)   Time 4   Period Weeks   Status New           PT Long Term Goals - 01/25/16 1405    PT LONG TERM GOAL #1   Title verbalize understanding of fall prevention strategies (03/14/16)   Status On-going   PT LONG TERM GOAL #2   Title improve BERG balance score to >/= 40/56 for improved balance (03/14/16)   Status On-going   PT LONG TERM GOAL #3   Title timed up and go:  n/a   (03/14/16)   Status Achieved   PT LONG TERM GOAL #4   Title gait velocity:   n/a    (03/14/16)   Status Achieved               Plan - 01/28/16 1458    Clinical Impression Statement Initiated HEP for balance and ankle strengthening.  Pt demonstrates difficulty on compliant surfaces. Will continue to benefit from PT to maximize function and improve balance.    PT Next Visit Plan Reviw HEP, continue with activities to promote increased hip / knee flexion, DF, to improve LE clearance.     Consulted and Agree with Plan of Care Patient        Problem List Patient Active Problem List   Diagnosis Date  Noted  . OA (osteoarthritis) of knee 05/27/2015  . Diabetes mellitus  type II, controlled ([place]) 11/26/2014  . Gastroesophageal reflux disease without esophagitis 11/26/2014  . Arthritis of both knees 11/26/2014  . Absence of bladder continence 11/26/2014  . Hyperlipidemia LDL goal <100 11/26/2014  . Acute medial meniscal tear 07/16/2014  . Lumbosacral spondylosis without myelopathy 02/17/2014  . Spondylosis, lumbosacral 02/17/2014   Laureen Abrahams, PT, DPT 01/28/2016 3:02 PM  [facility] [address]  [place], [place], [address] Phone: [phone]   Fax:  [phone]  Name: Matthew Nash MRN: [id number] Date of Birth: 1936/05/09

## 2016-01-29 NOTE — Telephone Encounter (Signed)
Left msg with caregiver to have pt call to schedule surgical clearance appt.

## 2016-01-29 NOTE — Telephone Encounter (Signed)
Scheduled for 05/10/16

## 2016-02-01 ENCOUNTER — Ambulatory Visit: Payer: Medicare Other

## 2016-02-01 DIAGNOSIS — R29898 Other symptoms and signs involving the musculoskeletal system: Secondary | ICD-10-CM | POA: Diagnosis not present

## 2016-02-01 DIAGNOSIS — R262 Difficulty in walking, not elsewhere classified: Secondary | ICD-10-CM

## 2016-02-01 DIAGNOSIS — R269 Unspecified abnormalities of gait and mobility: Secondary | ICD-10-CM | POA: Diagnosis not present

## 2016-02-01 DIAGNOSIS — Z9181 History of falling: Secondary | ICD-10-CM

## 2016-02-01 DIAGNOSIS — R296 Repeated falls: Secondary | ICD-10-CM | POA: Diagnosis not present

## 2016-02-01 DIAGNOSIS — M25661 Stiffness of right knee, not elsewhere classified: Secondary | ICD-10-CM | POA: Diagnosis not present

## 2016-02-01 DIAGNOSIS — M25561 Pain in right knee: Secondary | ICD-10-CM | POA: Diagnosis not present

## 2016-02-01 NOTE — Therapy (Signed)
[facility] [address]  [place], [place], [address] Phone: [phone]   Fax:  [phone]  Physical Therapy Treatment  Patient Details  Name: Matthew Nash MRN: [id number] Date of Birth: Dec 12, 1936 Referring Provider: Brunetta Jeans, MD  Encounter Date: 02/01/2016      PT End of Session - 02/01/16 1406    Visit Number 5   Number of Visits 16   Date for PT Re-Evaluation 03/14/16   PT Start Time [id number]   PT Stop Time 1400   PT Time Calculation (min) 44 min   Activity Tolerance Patient tolerated treatment well   Behavior During Therapy [facility] for tasks assessed/performed      Past Medical History  Diagnosis Date  . BPH (benign prostatic hyperplasia)     hx s/p turp  . Hyperlipidemia     takes Simvastatin and Tricor daily-under control  . Joint pain     both knees  . Joint swelling     rt knee  . Chronic back pain     spondylosis  . Melanoma ([place])     right hand;basal cell carcinoma  . H/O hiatal hernia   . History of colon polyps   . Diverticulitis   . Neuropathy ([place])   . Prostate infection     currently taking macrobid  . Diarrhea     x 1 today  . History of chicken pox   . Urinary incontinence   . UTI (lower urinary tract infection)   . Back pain at L4-L5 level   . Arthritis     both knees  . DDD (degenerative disc disease), lumbar   . History of kidney stones   . Diabetes mellitus     takes Metformin and Victoza daily-under control  . Hepatitis     B-with Mono and jaundice 1960  . GERD (gastroesophageal reflux disease)     takes Omeprazole daily-under control  . Hypertension     takes Lisinopril daily-under control  . H/O measles   . Pneumonia     hx of  . Tinnitus     Past Surgical History  Procedure Laterality Date  . Shoulder arthroscopy w/ rotator cuff repair Left NOV 2010  . Transurethral resection of prostate  2006  . Cortisone injection    . I&d left knee Left 1958   states 22 times  . C5 and t1 bone chip removed   1986  . Cholecystectomy  1993  . Right shoulder surgery  2012    rotator cuff repair  . Colonoscopy  2011  . Lumbar laminectomy/decompression microdiscectomy Right 02/17/2014    Procedure: RIGHT LUMBAR TWO-THREE LAMINECTOMY;  Surgeon: Charlie Pitter, MD;  Location: [facility];  Service: Neurosurgery;  Laterality: Right;  right   . Knee arthroscopy Right 07/16/2014    Procedure: RIGHT ARTHROSCOPY KNEE WITH Medial and Lateral DEBRIDEMENT and chondroplasty;  Surgeon: Gearlean Alf, MD;  [facility]: [facility];  Service: Orthopedics;  Laterality: Right;  . Wisdom tooth extraction    . Basal cell carcinoma excision    . Vasectomy  1983  . Tonsillectomy  1942  . Cauterize inner nose  1957  . Total knee arthroplasty Right 05/27/2015    Procedure: RIGHT TOTAL KNEE ARTHROPLASTY;  Surgeon: Gaynelle Arabian, MD;  [facility];  Service: Orthopedics;  Laterality: Right;    There were no vitals filed for this visit.  Visit Diagnosis:  Abnormality of gait  Weakness of both  lower extremities  At high risk for falls  Difficulty walking      Subjective Assessment - 02/01/16 1601    Subjective Pt. with no pain or other complaints reported.     Patient Stated Goals go fly fishing without needing assistance; walk without fear of falling   Currently in Pain? No/denies   Pain Score 0-No pain   Multiple Pain Sites No      Therex:    Corner balance: standard stance, staggered stance, feet together, with head turns side / side, up / down, diagonals x 30 sec each way Alternating toe-touch 8" step 3 x 10 each leg; no UE support; gait belt Alternating toe-touch on rubber ball x 10 reps each leg; no UE support; gait belt Toe-heel wt. Shiftingon blue foam pad 2 x 20 each way; 1 UE support  High knee marching next to counter down / back x 4; 2 UE support  High knee side stepping next to counter down / back x 4; 2 UE support Alternating step over 2" half  white bolster x 10 each leg          PT Short Term Goals - 01/18/16 1149    PT SHORT TERM GOAL #1   Title independent with HEP (02/15/16)   Time 4   Period Weeks   Status New   PT SHORT TERM GOAL #2   Title improve BERG balance score to >/= 36/56 for improved balance and decreased fall risk (02/15/16)   Time 4   Period Weeks   Status New   PT SHORT TERM GOAL #3   Title assess benefit of AFO and initiate consult if appropriate (02/15/16)   Time 4   Period Weeks   Status New   PT SHORT TERM GOAL #4   Title assess gait velocity and timed up and go with LTGs to be written (02/15/16)   Time 4   Period Weeks   Status New           PT Long Term Goals - 01/25/16 1405    PT LONG TERM GOAL #1   Title verbalize understanding of fall prevention strategies (03/14/16)   Status On-going   PT LONG TERM GOAL #2   Title improve BERG balance score to >/= 40/56 for improved balance (03/14/16)   Status On-going   PT LONG TERM GOAL #3   Title timed up and go:  n/a   (03/14/16)   Status Achieved   PT LONG TERM GOAL #4   Title gait velocity:   n/a    (03/14/16)   Status Achieved            Plan - 02/01/16 1603    Clinical Impression Statement Today's treatment focused on HEP review and single leg balance activities.  Pt. tolerated alternating toe-touch step ups with no UE support well however demo'd LOB x 2 with therapist assist to recover.  Pt. cooperative and motivated with challaning balance activities.     PT Next Visit Plan Reviw HEP, continue with activities to promote increased hip / knee flexion, DF, to improve LE clearance.     Consulted and Agree with Plan of Care Patient        Problem List Patient Active Problem List   Diagnosis Date Noted  . OA (osteoarthritis) of knee 05/27/2015  . Diabetes mellitus type II, controlled ([place]) 11/26/2014  . Gastroesophageal reflux disease without esophagitis 11/26/2014  . Arthritis of both knees 11/26/2014  . Absence of bladder continence  11/26/2014  . Hyperlipidemia LDL goal <100 11/26/2014  . Acute medial meniscal tear 07/16/2014  . Lumbosacral spondylosis without myelopathy 02/17/2014  . Spondylosis, lumbosacral 02/17/2014    Bess Harvest, PTA 02/01/2016, 4:21 PM  [facility] [address]  [place], [place], [address] Phone: [phone]   Fax:  [phone]  Name: ODELL CARRERO MRN: [id number] Date of Birth: 09-15-1936

## 2016-02-04 ENCOUNTER — Ambulatory Visit: Payer: Medicare Other

## 2016-02-04 DIAGNOSIS — R262 Difficulty in walking, not elsewhere classified: Secondary | ICD-10-CM | POA: Diagnosis not present

## 2016-02-04 DIAGNOSIS — R29898 Other symptoms and signs involving the musculoskeletal system: Secondary | ICD-10-CM

## 2016-02-04 DIAGNOSIS — M25661 Stiffness of right knee, not elsewhere classified: Secondary | ICD-10-CM | POA: Diagnosis not present

## 2016-02-04 DIAGNOSIS — Z9181 History of falling: Secondary | ICD-10-CM

## 2016-02-04 DIAGNOSIS — R269 Unspecified abnormalities of gait and mobility: Secondary | ICD-10-CM

## 2016-02-04 DIAGNOSIS — M25561 Pain in right knee: Secondary | ICD-10-CM | POA: Diagnosis not present

## 2016-02-04 DIAGNOSIS — R296 Repeated falls: Secondary | ICD-10-CM | POA: Diagnosis not present

## 2016-02-04 NOTE — Therapy (Signed)
[facility] [address]  [place], [place], [address] Phone: [phone]   Fax:  [phone]  Physical Therapy Treatment  Patient Details  Name: Matthew Nash MRN: [id number] Date of Birth: 12/31/1935 Referring Provider: Brunetta Jeans, MD  Encounter Date: 02/04/2016      PT End of Session - 02/04/16 1409    Visit Number 6   Number of Visits 16   Date for PT Re-Evaluation 03/14/16   PT Start Time [id number]   PT Stop Time 1402   PT Time Calculation (min) 46 min   Activity Tolerance Patient tolerated treatment well   Behavior During Therapy [facility] for tasks assessed/performed      Past Medical History  Diagnosis Date  . BPH (benign prostatic hyperplasia)     hx s/p turp  . Hyperlipidemia     takes Simvastatin and Tricor daily-under control  . Joint pain     both knees  . Joint swelling     rt knee  . Chronic back pain     spondylosis  . Melanoma ([place])     right hand;basal cell carcinoma  . H/O hiatal hernia   . History of colon polyps   . Diverticulitis   . Neuropathy ([place])   . Prostate infection     currently taking macrobid  . Diarrhea     x 1 today  . History of chicken pox   . Urinary incontinence   . UTI (lower urinary tract infection)   . Back pain at L4-L5 level   . Arthritis     both knees  . DDD (degenerative disc disease), lumbar   . History of kidney stones   . Diabetes mellitus     takes Metformin and Victoza daily-under control  . Hepatitis     B-with Mono and jaundice 1960  . GERD (gastroesophageal reflux disease)     takes Omeprazole daily-under control  . Hypertension     takes Lisinopril daily-under control  . H/O measles   . Pneumonia     hx of  . Tinnitus     Past Surgical History  Procedure Laterality Date  . Shoulder arthroscopy w/ rotator cuff repair Left NOV 2010  . Transurethral resection of prostate  2006  . Cortisone injection    . I&d left knee Left 1958   states 22 times  . C5 and t1 bone chip removed   1986  . Cholecystectomy  1993  . Right shoulder surgery  2012    rotator cuff repair  . Colonoscopy  2011  . Lumbar laminectomy/decompression microdiscectomy Right 02/17/2014    Procedure: RIGHT LUMBAR TWO-THREE LAMINECTOMY;  Surgeon: Charlie Pitter, MD;  Location: [facility] [facility];  Service: Neurosurgery;  Laterality: Right;  right   . Knee arthroscopy Right 07/16/2014    Procedure: RIGHT ARTHROSCOPY KNEE WITH Medial and Lateral DEBRIDEMENT and chondroplasty;  Surgeon: Gearlean Alf, MD;  [facility]: [facility];  Service: Orthopedics;  Laterality: Right;  . Wisdom tooth extraction    . Basal cell carcinoma excision    . Vasectomy  1983  . Tonsillectomy  1942  . Cauterize inner nose  1957  . Total knee arthroplasty Right 05/27/2015    Procedure: RIGHT TOTAL KNEE ARTHROPLASTY;  Surgeon: Gaynelle Arabian, MD;  [facility];  Service: Orthopedics;  Laterality: Right;    There were no vitals filed for this visit.  Visit Diagnosis:  Abnormality of gait  Weakness of both  lower extremities  At high risk for falls      Subjective Assessment - 02/04/16 1318    Subjective no pain or complaints reported.  Pt. anticipates answer back from orthotist regarding [name] AFO's.     Currently in Pain? No/denies      Therex:   Corner balance: standard stance, staggered stance, feet together, with head turns side / side, up / down, diagonals x 30 sec each way Alternating toe-touch 8" step 3 x 10 each leg; no UE support; gait belt Toe-heel wt. Shiftingon blue foam pad 2 x 20 each way; 1 UE support  High knee marching next to counter down / back x 4; 2 UE support  High knee side stepping next to counter down / back x 4; 2 UE support Double leg balance narrow stance on blue foam pad with perturbations 3 x 1 min   B Hip extension / flexion / abduction x 10 reps each leg with 2# cuff weight on ankles holding onto counter  Leg curl machine double leg x 10  reps double  Leg curl machine single leg x 10 reps each side  Leg ext. Machine x 10 double leg reps         PT Short Term Goals - 02/04/16 1423    PT SHORT TERM GOAL #1   Title independent with HEP (02/15/16)   Time 4   Period Weeks   Status On-going   PT SHORT TERM GOAL #2   Title improve BERG balance score to >/= 36/56 for improved balance and decreased fall risk (02/15/16)   Time 4   Period Weeks   Status On-going   PT SHORT TERM GOAL #3   Title assess benefit of AFO and initiate consult if appropriate (02/15/16)   Time 4   Period Weeks   Status On-going   PT SHORT TERM GOAL #4   Title assess gait velocity and timed up and go with LTGs to be written (02/15/16)   Time 4   Period Weeks   Status On-going           PT Long Term Goals - 01/25/16 1405    PT LONG TERM GOAL #1   Title verbalize understanding of fall prevention strategies (03/14/16)   Status On-going   PT LONG TERM GOAL #2   Title improve BERG balance score to >/= 40/56 for improved balance (03/14/16)   Status On-going   PT LONG TERM GOAL #3   Title timed up and go:  n/a   (03/14/16)   Status Achieved   PT LONG TERM GOAL #4   Title gait velocity:   n/a    (03/14/16)   Status Achieved          Plan - 02/04/16 1409    Clinical Impression Statement Today's treatment focused on B LE hip / knee balance, proprioception, strength training.  HS curl and knee ext. machine's added today with pt. tolerated activitiy well.  Pt. anticipating bilateral [name] AFO's within the week to improve B LE clearance with ambulation. Pt. with improved LE clearance without AFO's in today's treatment with alternating toe-touch to 8" step.     PT Next Visit Plan Reviw HEP, continue with activities to promote increased hip / knee flexion, DF, to improve LE clearance.  Ask if pt. has recieved [name] AFO's.     Consulted and Agree with Plan of Care Patient      Problem List Patient Active Problem List  Diagnosis Date Noted  . OA  (osteoarthritis) of knee 05/27/2015  . Diabetes mellitus type II, controlled ([place]) 11/26/2014  . Gastroesophageal reflux disease without esophagitis 11/26/2014  . Arthritis of both knees 11/26/2014  . Absence of bladder continence 11/26/2014  . Hyperlipidemia LDL goal <100 11/26/2014  . Acute medial meniscal tear 07/16/2014  . Lumbosacral spondylosis without myelopathy 02/17/2014  . Spondylosis, lumbosacral 02/17/2014    Bess Harvest, PTA 02/04/2016, 2:33 PM  [facility] [address]  [place], [place], [address] Phone: [phone]   Fax:  [phone]  Name: Matthew Nash MRN: [id number] Date of Birth: Aug 04, 1936

## 2016-02-06 ENCOUNTER — Encounter: Payer: Self-pay | Admitting: Physician Assistant

## 2016-02-07 MED ORDER — SIMVASTATIN 10 MG PO TABS: 10.0000 mg | ORAL_TABLET | Freq: Every day | ORAL | Status: DC

## 2016-02-08 ENCOUNTER — Ambulatory Visit: Payer: Medicare Other

## 2016-02-08 DIAGNOSIS — R262 Difficulty in walking, not elsewhere classified: Secondary | ICD-10-CM | POA: Diagnosis not present

## 2016-02-08 DIAGNOSIS — R269 Unspecified abnormalities of gait and mobility: Secondary | ICD-10-CM

## 2016-02-08 DIAGNOSIS — M25661 Stiffness of right knee, not elsewhere classified: Secondary | ICD-10-CM | POA: Diagnosis not present

## 2016-02-08 DIAGNOSIS — R29898 Other symptoms and signs involving the musculoskeletal system: Secondary | ICD-10-CM

## 2016-02-08 DIAGNOSIS — R296 Repeated falls: Secondary | ICD-10-CM | POA: Diagnosis not present

## 2016-02-08 DIAGNOSIS — Z9181 History of falling: Secondary | ICD-10-CM

## 2016-02-08 DIAGNOSIS — M25561 Pain in right knee: Secondary | ICD-10-CM | POA: Diagnosis not present

## 2016-02-09 NOTE — Therapy (Signed)
[facility] [address]  [place], [place], [address] Phone: [phone]   Fax:  [phone]  Physical Therapy Treatment  Patient Details  Name: Matthew Nash MRN: [id number] Date of Birth: [birth year] Referring Provider: Brunetta Jeans, MD  Encounter Date: 02/08/2016      PT End of Session - 02/08/16 1407    Visit Number 7   Number of Visits 16   Date for PT Re-Evaluation 03/14/16   PT Start Time 1400   PT Stop Time 1446   PT Time Calculation (min) 46 min   Activity Tolerance Patient tolerated treatment well   Behavior During Therapy [facility] for tasks assessed/performed      Past Medical History  Diagnosis Date  . BPH (benign prostatic hyperplasia)     hx s/p turp  . Hyperlipidemia     takes Simvastatin and Tricor daily-under control  . Joint pain     both knees  . Joint swelling     rt knee  . Chronic back pain     spondylosis  . Melanoma ([place])     right hand;basal cell carcinoma  . H/O hiatal hernia   . History of colon polyps   . Diverticulitis   . Neuropathy ([place])   . Prostate infection     currently taking macrobid  . Diarrhea     x 1 today  . History of chicken pox   . Urinary incontinence   . UTI (lower urinary tract infection)   . Back pain at L4-L5 level   . Arthritis     both knees  . DDD (degenerative disc disease), lumbar   . History of kidney stones   . Diabetes mellitus     takes Metformin and Victoza daily-under control  . Hepatitis     B-with Mono and jaundice 1960  . GERD (gastroesophageal reflux disease)     takes Omeprazole daily-under control  . Hypertension     takes Lisinopril daily-under control  . H/O measles   . Pneumonia     hx of  . Tinnitus     Past Surgical History  Procedure Laterality Date  . Shoulder arthroscopy w/ rotator cuff repair Left NOV 2010  . Transurethral resection of prostate  2006  . Cortisone injection    . I&d left knee Left 1958   states 22 times  . C5 and t1 bone chip removed   1986  . Cholecystectomy  1993  . Right shoulder surgery  2012    rotator cuff repair  . Colonoscopy  2011  . Lumbar laminectomy/decompression microdiscectomy Right 02/17/2014    Procedure: RIGHT LUMBAR TWO-THREE LAMINECTOMY;  Surgeon: Charlie Pitter, MD;  Location: [facility];  Service: Neurosurgery;  Laterality: Right;  right   . Knee arthroscopy Right 07/16/2014    Procedure: RIGHT ARTHROSCOPY KNEE WITH Medial and Lateral DEBRIDEMENT and chondroplasty;  Surgeon: Gearlean Alf, MD;  Location: [facility];  Service: Orthopedics;  Laterality: Right;  . Wisdom tooth extraction    . Basal cell carcinoma excision    . Vasectomy  1983  . Tonsillectomy  1942  . Cauterize inner nose  1957  . Total knee arthroplasty Right 05/27/2015    Procedure: RIGHT TOTAL KNEE ARTHROPLASTY;  Surgeon: Gaynelle Arabian, MD;  Location: [facility];  Service: Orthopedics;  Laterality: Right;    There were no vitals filed for this visit.  Visit Diagnosis:  Abnormality of gait  Weakness of both  lower extremities  At high risk for falls  Difficulty walking      Subjective Assessment - 02/08/16 1441    Subjective No pain or other complaints reported.  Pt. anticipates [name] AFO's on march 7th MD visit.  Pt. also reports doing corner balance HEP after receiving foam pad from [employer].       Therex:   Corner balance: standard stance, staggered stance, feet together, with head turns side / side, up / down, diagonals x 30 sec each way Alternating toe-touch 8" step 3 x 10 each leg; no UE support; gait belt Toe-heel wt. Shiftingon blue foam pad 2 x 20 each way; 1 UE support  High knee marching next to counter down / back x 4; 2 UE support  High knee side stepping next to counter down / back x 4; 2 UE support Double leg balance narrow stance on blue foam pad with perturbations 3 x 1 min  Leg curl machine double leg x 10 reps @ 35#   Leg curl machine single leg x 10  reps each side @ 25# Leg ext. Machine x 10 double leg reps @ 25#        PT Short Term Goals - 02/04/16 1423    PT SHORT TERM GOAL #1   Title independent with HEP (02/15/16)   Time 4   Period Weeks   Status On-going   PT SHORT TERM GOAL #2   Title improve BERG balance score to >/= 36/56 for improved balance and decreased fall risk (02/15/16)   Time 4   Period Weeks   Status On-going   PT SHORT TERM GOAL #3   Title assess benefit of AFO and initiate consult if appropriate (02/15/16)   Time 4   Period Weeks   Status On-going   PT SHORT TERM GOAL #4   Title assess gait velocity and timed up and go with LTGs to be written (02/15/16)   Time 4   Period Weeks   Status On-going           PT Long Term Goals - 01/25/16 1405    PT LONG TERM GOAL #1   Title verbalize understanding of fall prevention strategies (03/14/16)   Status On-going   PT LONG TERM GOAL #2   Title improve BERG balance score to >/= 40/56 for improved balance (03/14/16)   Status On-going   PT LONG TERM GOAL #3   Title timed up and go:  n/a   (03/14/16)   Status Achieved   PT LONG TERM GOAL #4   Title gait velocity:   n/a    (03/14/16)   Status Achieved           Plan - 02/08/16 1407    Clinical Impression Statement Today's treatment focused on B LE balance and proprioception training with pt. tolerating all therex well with no complaint.  Pt. continues to demo occasional LOB with high level balance activities however able to self-correct with CGA from therapist; pt. with increased difficulty with rotational toe-touch activities.  Pt. anticipates receiving B AFO's on 3/7 to improve bilateral LE clearance with gait.  Pt. would benefit from further skilled instruction with gait technique for AFO's.       PT Next Visit Plan Reviw HEP, continue with activities to promote increased hip / knee flexion, DF, to improve LE clearance.  Ask if pt. has recieved [name] AFO's.  Perform BERG balance test, assess gait speed, TUG    Consulted and Agree  with Plan of Care Patient      Problem List Patient Active Problem List   Diagnosis Date Noted  . OA (osteoarthritis) of knee 05/27/2015  . Diabetes mellitus type II, controlled ([place]) 11/26/2014  . Gastroesophageal reflux disease without esophagitis 11/26/2014  . Arthritis of both knees 11/26/2014  . Absence of bladder continence 11/26/2014  . Hyperlipidemia LDL goal <100 11/26/2014  . Acute medial meniscal tear 07/16/2014  . Lumbosacral spondylosis without myelopathy 02/17/2014  . Spondylosis, lumbosacral 02/17/2014    Bess Harvest, PTA 02/09/2016, 2:36 PM  [facility] [address]  [place], [place], [address] Phone: [phone]   Fax:  [phone]  Name: Matthew Nash MRN: [id number] Date of Birth: [birth year]

## 2016-02-11 ENCOUNTER — Ambulatory Visit: Payer: Medicare Other | Attending: Physician Assistant | Admitting: Physical Therapy

## 2016-02-11 DIAGNOSIS — M25661 Stiffness of right knee, not elsewhere classified: Secondary | ICD-10-CM | POA: Diagnosis not present

## 2016-02-11 DIAGNOSIS — R29898 Other symptoms and signs involving the musculoskeletal system: Secondary | ICD-10-CM | POA: Diagnosis not present

## 2016-02-11 DIAGNOSIS — R296 Repeated falls: Secondary | ICD-10-CM | POA: Diagnosis not present

## 2016-02-11 DIAGNOSIS — M25561 Pain in right knee: Secondary | ICD-10-CM | POA: Diagnosis not present

## 2016-02-11 DIAGNOSIS — R262 Difficulty in walking, not elsewhere classified: Secondary | ICD-10-CM

## 2016-02-11 DIAGNOSIS — R269 Unspecified abnormalities of gait and mobility: Secondary | ICD-10-CM | POA: Diagnosis not present

## 2016-02-11 DIAGNOSIS — Z9181 History of falling: Secondary | ICD-10-CM

## 2016-02-11 NOTE — Therapy (Signed)
[facility] [address]  [place], [place], [address] Phone: [phone]   Fax:  [phone]  Physical Therapy Treatment  Patient Details  Name: Matthew Nash MRN: [id number] Date of Birth: November 01, 1936 Referring Provider: Brunetta Jeans, MD  Encounter Date: 02/11/2016      PT End of Session - 02/11/16 1348    Visit Number 8   Number of Visits 16   Date for PT Re-Evaluation 03/14/16   PT Start Time 1300   PT Stop Time 3790   PT Time Calculation (min) 47 min   Activity Tolerance Patient tolerated treatment well   Behavior During Therapy [facility] for tasks assessed/performed      Past Medical History  Diagnosis Date  . BPH (benign prostatic hyperplasia)     hx s/p turp  . Hyperlipidemia     takes Simvastatin and Tricor daily-under control  . Joint pain     both knees  . Joint swelling     rt knee  . Chronic back pain     spondylosis  . Melanoma ([place])     right hand;basal cell carcinoma  . H/O hiatal hernia   . History of colon polyps   . Diverticulitis   . Neuropathy ([place])   . Prostate infection     currently taking macrobid  . Diarrhea     x 1 today  . History of chicken pox   . Urinary incontinence   . UTI (lower urinary tract infection)   . Back pain at L4-L5 level   . Arthritis     both knees  . DDD (degenerative disc disease), lumbar   . History of kidney stones   . Diabetes mellitus     takes Metformin and Victoza daily-under control  . Hepatitis     B-with Mono and jaundice 1960  . GERD (gastroesophageal reflux disease)     takes Omeprazole daily-under control  . Hypertension     takes Lisinopril daily-under control  . H/O measles   . Pneumonia     hx of  . Tinnitus     Past Surgical History  Procedure Laterality Date  . Shoulder arthroscopy w/ rotator cuff repair Left NOV 2010  . Transurethral resection of prostate  2006  . Cortisone injection    . I&d left knee Left 1958   states 22 times  . C5 and t1 bone chip removed   1986  . Cholecystectomy  1993  . Right shoulder surgery  2012    rotator cuff repair  . Colonoscopy  2011  . Lumbar laminectomy/decompression microdiscectomy Right 02/17/2014    Procedure: RIGHT LUMBAR TWO-THREE LAMINECTOMY;  Surgeon: Charlie Pitter, MD;  Location: [place];  Service: Neurosurgery;  Laterality: Right;  right   . Knee arthroscopy Right 07/16/2014    Procedure: RIGHT ARTHROSCOPY KNEE WITH Medial and Lateral DEBRIDEMENT and chondroplasty;  Surgeon: Gearlean Alf, MD;  Location: [facility];  Service: Orthopedics;  Laterality: Right;  . Wisdom tooth extraction    . Basal cell carcinoma excision    . Vasectomy  1983  . Tonsillectomy  1942  . Cauterize inner nose  1957  . Total knee arthroplasty Right 05/27/2015    Procedure: RIGHT TOTAL KNEE ARTHROPLASTY;  Surgeon: Gaynelle Arabian, MD;  Location: [facility];  Service: Orthopedics;  Laterality: Right;    There were no vitals filed for this visit.  Visit Diagnosis:  Abnormality of gait  At high risk  for falls  Weakness of both lower extremities  Difficulty walking  Knee stiffness, right  Right knee pain      Subjective Assessment - 02/11/16 1304    Subjective No complaints; going fishing this weekend. Had some L knee pain after doing machines last visit.   Patient Stated Goals go fly fishing without needing assistance; walk without fear of falling   Currently in Pain? No/denies            [facility] PT Assessment - 02/11/16 1312    Ambulation/Gait   Gait velocity 2.23 ft/sec  14.72   Berg Balance Test   Sit to Stand Able to stand without using hands and stabilize independently   Standing Unsupported Able to stand safely 2 minutes   Sitting with Back Unsupported but Feet Supported on Floor or Stool Able to sit safely and securely 2 minutes   Stand to Sit Controls descent by using hands   Transfers Able to transfer safely, definite need of hands   Standing Unsupported  with Eyes Closed Able to stand 10 seconds with supervision   Standing Ubsupported with Feet Together Able to place feet together independently and stand 1 minute safely   From Standing, Reach Forward with Outstretched Arm Can reach confidently >25 cm (10")   From Standing Position, Pick up Object from Floor Able to pick up shoe, needs supervision   From Standing Position, Turn to Look Behind Over each Shoulder Looks behind one side only/other side shows less weight shift   Turn 360 Degrees Able to turn 360 degrees safely but slowly   Standing Unsupported, Alternately Place Feet on Step/Stool Able to complete 4 steps without aid or supervision   Standing Unsupported, One Foot in Front Able to take small step independently and hold 30 seconds   Standing on One Leg Tries to lift leg/unable to hold 3 seconds but remains standing independently   Total Score 42   Timed Up and Go Test   Normal TUG (seconds) 14.12                     [facility] Adult PT Treatment/Exercise - 02/11/16 1312    Ankle Exercises: Seated   Heel Raises 10 reps   Toe Raise 10 reps   Toe Raise Limitations bil        Feet Apart on Compliant Surface: EO with horizontal/vertical/diagonal head turns x 10 each direction; EC 5x10 sec           PT Short Term Goals - 02/11/16 1350    PT SHORT TERM GOAL #1   Title independent with HEP (02/15/16)   Status Achieved   PT SHORT TERM GOAL #2   Title improve BERG balance score to >/= 36/56 for improved balance and decreased fall risk (02/15/16)   Status Achieved   PT SHORT TERM GOAL #3   Title assess benefit of AFO and initiate consult if appropriate (02/15/16)   Status Achieved   PT SHORT TERM GOAL #4   Title assess gait velocity and timed up and go with LTGs to be written (02/15/16)   Status Achieved           PT Long Term Goals - 02/11/16 1350    PT LONG TERM GOAL #1   Title verbalize understanding of fall prevention strategies (03/14/16)   Status On-going    PT LONG TERM GOAL #2   Title improve BERG balance score to >/= 45/56 for improved balance (03/14/16)   Status Revised  PT LONG TERM GOAL #3   Title timed up and go:  <13 sec  (03/14/16)   Status Revised   PT LONG TERM GOAL #4   Title gait velocity: > 2.62 ft/sec    (03/14/16)   Status Revised               Plan - 02/11/16 1348    Clinical Impression Statement Pt has met all STGs and is progressing well towards LTGs.  May need to hold PT after orthotics consult until AFOs arrive.     PT Next Visit Plan Continue with activities to promote increased hip / knee flexion, DF, to improve LE clearance.     Consulted and Agree with Plan of Care Patient        Problem List Patient Active Problem List   Diagnosis Date Noted  . OA (osteoarthritis) of knee 05/27/2015  . Diabetes mellitus type II, controlled ([place]) 11/26/2014  . Gastroesophageal reflux disease without esophagitis 11/26/2014  . Arthritis of both knees 11/26/2014  . Absence of bladder continence 11/26/2014  . Hyperlipidemia LDL goal <100 11/26/2014  . Acute medial meniscal tear 07/16/2014  . Lumbosacral spondylosis without myelopathy 02/17/2014  . Spondylosis, lumbosacral 02/17/2014   Laureen Abrahams, PT, DPT 02/11/2016 1:52 PM  [facility] [address]  [place], [place], [address] Phone: [phone]   Fax:  [phone]  Name: Matthew Nash MRN: [id number] Date of Birth: June 09, 1936

## 2016-02-15 ENCOUNTER — Encounter: Payer: Self-pay | Admitting: Family Medicine

## 2016-02-15 ENCOUNTER — Ambulatory Visit (INDEPENDENT_AMBULATORY_CARE_PROVIDER_SITE_OTHER): Payer: Medicare Other | Admitting: Family Medicine

## 2016-02-15 ENCOUNTER — Ambulatory Visit: Payer: Medicare Other | Admitting: Physical Therapy

## 2016-02-15 ENCOUNTER — Ambulatory Visit: Payer: Medicare Other | Admitting: Family

## 2016-02-15 VITALS — BP 118/70 | HR 84 | Temp 98.4°F | Ht 70.0 in | Wt 220.8 lb

## 2016-02-15 DIAGNOSIS — Z8639 Personal history of other endocrine, nutritional and metabolic disease: Secondary | ICD-10-CM

## 2016-02-15 DIAGNOSIS — S0180XA Unspecified open wound of other part of head, initial encounter: Secondary | ICD-10-CM

## 2016-02-15 DIAGNOSIS — Y92099 Unspecified place in other non-institutional residence as the place of occurrence of the external cause: Secondary | ICD-10-CM

## 2016-02-15 DIAGNOSIS — R972 Elevated prostate specific antigen [PSA]: Secondary | ICD-10-CM | POA: Diagnosis not present

## 2016-02-15 DIAGNOSIS — Z23 Encounter for immunization: Secondary | ICD-10-CM

## 2016-02-15 DIAGNOSIS — W19XXXA Unspecified fall, initial encounter: Secondary | ICD-10-CM | POA: Diagnosis not present

## 2016-02-15 DIAGNOSIS — Y92009 Unspecified place in unspecified non-institutional (private) residence as the place of occurrence of the external cause: Principal | ICD-10-CM

## 2016-02-15 DIAGNOSIS — Z9889 Other specified postprocedural states: Secondary | ICD-10-CM

## 2016-02-15 NOTE — Patient Instructions (Signed)
It was good to see you today- let us know if you have any change or worsening of your symptoms.   If you have any severe headache, nausea/ vomiting or confusion go to the ER for further evaluation I will be in touch with your labs If you do develop any symptoms of a concussion such as excessive fatigue or feeling :slowed down" please take it easy and give me a call  Let us know if you have any further falling episodes

## 2016-02-15 NOTE — Progress Notes (Signed)
Pre visit review using our clinic review tool, if applicable. No additional management support is needed unless otherwise documented below in the visit note. 

## 2016-02-15 NOTE — Progress Notes (Signed)
Matthew Nash at [facility] [address], [place], [place] [address] [phone]  Date:  02/15/2016   Name:  Matthew Nash   DOB:  1936-11-07   MRN:  [id number]  PCP:  Leeanne Rio, PA-C    Chief Complaint: Fall   History of Present Illness:  Matthew Nash is a 80 y.o. very pleasant male patient who presents with the following:  History of DM 2, HTN, dyslipidemia.   He is not on any blood thinners except baby asa  He fell the night before last- he was staying away from home at a hotel in [place] for a social event.  He awoke to use the bathroom in the early morning hours- notes that the room was dark and he was feeling his way along the furniture to find the bathroom.  He cannot say for certain if he tripped, lost his balance or passed out, but in any case he fell.  He fell onto the ground and scraped the bridge of his nose (he was not wearing his glasses at the time). He also got a nose bleed when he fell.   He has bad knees and it took him a few moments to get up off the floor.  However he does not think there was any LOC.  Him roommate was asleep and did not hear him.  He was able to get up and make it to the bathroom on his own  Yesterday he noted a "slight headache." The HA seems to have resolved today however  He tends to have a balance issue- today he noted that he started to fall forward.  He normally wants to fall backwards if he "cannot see the horizon."  However on further questioning he feels that his balance issues are not really any different than they have been over the last 5 years.  They plan to put him in ?AFOs for his ankles to help with his balance He went to PT today but the therapist wanted him to be seen in case he had a concussion.    He felt like he had "tunnel vision" just now while walking down the hall to exam room- however this resolved after a few minutes  No nausea or vomiting, appetite is ok  today  He does take victoza and metformin for his DM Yesterday am his glucose was a little high for him at 130 His DM is well controled generally  Lab Results  Component Value Date   HGBA1C 6.7* 09/18/2015   He did trip and fall while fishing a couple of years ago and hit his head- he thinks he may have had a mild concussion then  Last tetanus: this was more than 10 years ago. He is ok with doing this today  Dr. Era Bumpers is his urologist- he would like for me to check a PSA for him today as this has not been done in a while.    Patient Active Problem List   Diagnosis Date Noted  . OA (osteoarthritis) of knee 05/27/2015  . Diabetes mellitus type II, controlled ([place]) 11/26/2014  . Gastroesophageal reflux disease without esophagitis 11/26/2014  . Arthritis of both knees 11/26/2014  . Absence of bladder continence 11/26/2014  . Hyperlipidemia LDL goal <100 11/26/2014  . Acute medial meniscal tear 07/16/2014  . Lumbosacral spondylosis without myelopathy 02/17/2014  . Spondylosis, lumbosacral 02/17/2014    Past Medical History  Diagnosis Date  . BPH (  benign prostatic hyperplasia)     hx s/p turp  . Hyperlipidemia     takes Simvastatin and Tricor daily-under control  . Joint pain     both knees  . Joint swelling     rt knee  . Chronic back pain     spondylosis  . Melanoma (Matthew Nash)     right hand;basal cell carcinoma  . H/O hiatal hernia   . History of colon polyps   . Diverticulitis   . Neuropathy ([place])   . Prostate infection     currently taking macrobid  . Diarrhea     x 1 today  . History of chicken pox   . Urinary incontinence   . UTI (lower urinary tract infection)   . Back pain at L4-L5 level   . Arthritis     both knees  . DDD (degenerative disc disease), lumbar   . History of kidney stones   . Diabetes mellitus     takes Metformin and Victoza daily-under control  . Hepatitis     B-with Mono and jaundice 1960  . GERD (gastroesophageal reflux disease)      takes Omeprazole daily-under control  . Hypertension     takes Lisinopril daily-under control  . H/O measles   . Pneumonia     hx of  . Tinnitus     Past Surgical History  Procedure Laterality Date  . Shoulder arthroscopy w/ rotator cuff repair Left NOV 2010  . Transurethral resection of prostate  2006  . Cortisone injection    . I&d left knee Left 1958    states 22 times  . C5 and t1 bone chip removed   1986  . Cholecystectomy  1993  . Right shoulder surgery  2012    rotator cuff repair  . Colonoscopy  2011  . Lumbar laminectomy/decompression microdiscectomy Right 02/17/2014    Procedure: RIGHT LUMBAR TWO-THREE LAMINECTOMY;  Surgeon: Charlie Pitter, MD;  Location: [facility];  Service: Neurosurgery;  Laterality: Right;  right   . Knee arthroscopy Right 07/16/2014    Procedure: RIGHT ARTHROSCOPY KNEE WITH Medial and Lateral DEBRIDEMENT and chondroplasty;  Surgeon: Gearlean Alf, MD;  Location: [facility];  Service: Orthopedics;  Laterality: Right;  . Wisdom tooth extraction    . Basal cell carcinoma excision    . Vasectomy  1983  . Tonsillectomy  1942  . Cauterize inner nose  1957  . Total knee arthroplasty Right 05/27/2015    Procedure: RIGHT TOTAL KNEE ARTHROPLASTY;  Surgeon: Gaynelle Arabian, MD;  Location: [facility];  Service: Orthopedics;  Laterality: Right;    Social History  Substance Use Topics  . Smoking status: Former Smoker    Types: Pipe    Quit date: 07/14/1978  . Smokeless tobacco: Never Used     Comment: quit smoking 1979  . Alcohol Use: Yes     Comment: OCCASIONAL    Family History  Problem Relation Age of Onset  . Heart disease Mother 44    Deceased  . Bladder Cancer Mother   . Prostate cancer Father     Deceased-in 60s  . Healthy Brother   . Skin cancer Brother     #2  . Asthma Sister     Deceased  . Healthy Son     #1  . Healthy Daughter     #2  . Obesity Son     #2    Allergies  Allergen Reactions  . Albamycin [Novobiocin]  Made me look  like a strawberry   . Shrimp [Shellfish Allergy] Itching    Unsure if allergic - has been eating fried fish  And shell fish since with no reactions  . Penicillins Rash    Medication list has been reviewed and updated.  Current Outpatient Prescriptions on File Prior to Visit  Medication Sig Dispense Refill  . aspirin 81 MG tablet Take 81 mg by mouth daily.    . Calcium Carb-Cholecalciferol (CALCIUM-VITAMIN D3) 600-400 MG-UNIT TABS Take 1 each by mouth daily.    . celecoxib (CELEBREX) 200 MG capsule Take 1 capsule (200 mg total) by mouth 2 (two) times daily. 180 capsule 0  . DUREZOL 0.05 % EMUL Reported on 01/18/2016    . fenofibrate (TRICOR) 145 MG tablet Take 1 tablet (145 mg total) by mouth every evening. 90 tablet 1  . glucose blood (FREESTYLE LITE) test strip Use as instructed to test Blood Glucose twice daily Dx: E11.9 200 each 3  . ILEVRO 0.3 % ophthalmic suspension Reported on 01/18/2016    . Insulin Pen Needle (CAREFINE PEN NEEDLES) 31G X 8 MM MISC Use once daily with Victoza injections. 100 each 3  . Lancets (FREESTYLE) lancets 1 each by Other route 2 (two) times daily. Use as instructed 200 each 3  . Liraglutide 18 MG/3ML SOPN Inject 0.3 mLs (1.8 mg total) into the skin daily. 6 mL 0  . lisinopril (PRINIVIL,ZESTRIL) 10 MG tablet Take 1 tablet (10 mg total) by mouth every evening. 90 tablet 1  . metFORMIN (GLUCOPHAGE-XR) 500 MG 24 hr tablet Take 1 tablet (500 mg total) by mouth 2 (two) times daily with a meal. 180 tablet 1  . Multiple Vitamins-Minerals (MENS MULTIVITAMIN PLUS) TABS Take by mouth.    [name] Kitchen omeprazole (PRILOSEC) 20 MG capsule Take 1 capsule (20 mg total) by mouth daily. 90 capsule 1  . simvastatin (ZOCOR) 10 MG tablet Take 1 tablet (10 mg total) by mouth at bedtime. 90 tablet 1  . tamsulosin (FLOMAX) 0.4 MG CAPS capsule Take 0.4 mg by mouth every evening.     . vitamin C (ASCORBIC ACID) 500 MG tablet Take 500 mg by mouth daily.     No current facility-administered  medications on file prior to visit.    Review of Systems:  As per HPI- otherwise negative.   Physical Examination: Filed Vitals:   02/15/16 1413  BP: 118/70  Pulse: 84  Temp: 98.4 F (36.9 C)   Filed Vitals:   02/15/16 1413  Height: 5\' 10"  (1.778 m)  Weight: 220 lb 12.8 oz (100.154 kg)   Body mass index is 31.68 kg/(m^2). Ideal Body Weight: Weight in (lb) to have BMI = 25: 173.9  GEN: WDWN, NAD, Non-toxic, A & O x 3, obese, looks well HEENT: Atraumatic, Normocephalic. Neck supple. No masses, No LAD.  Bilateral TM wnl, oropharynx normal.  PEERL,EOMI.   Abrasion across the bridge of his nose where the nosepiece of glasses sits.  No septal hematoma noted No cervical spine TTP, normal cervical ROM Normal limited fundoscopic exam Ears and Nose: No external deformity. CV: RRR- long listen, no evidence of arrythmia, No M/G/R. No JVD. No thrill. No extra heart sounds. PULM: CTA B, no wheezes, crackles, rhonchi. No retractions. No resp. distress. No accessory muscle use. EXTR: No c/c/e NEURO Normal gait.- he does uses a cane for knee pain.  Normal strength and DTR of all extremities.  He does wobble with romberg but this is consistent with his baseline balance  issues PSYCH: Normally interactive. Conversant. Not depressed or anxious appearing.  Calm demeanor.    Assessment and Plan: Fall at home, initial encounter - Plan: CBC  Wound, open, face, initial encounter - Plan: Td vaccine greater than or equal to 7yo preservative free IM  History of prostate surgery - Plan: PSA  History of type 2 diabetes mellitus - Plan: Comprehensive metabolic panel, Hemoglobin A1c  Elevated PSA - Plan: PSA  Here today following a fall that occurred night before last when he awoke at an unfamiliar hotel and tried to find the bathroom in the dark.  Although he is not certain it seems likely that he tripped and then fell.  Discussed further evaluation such as a CT of his head to rule- out bleed and  films of his nose to check for a nasal fracture.  As his HA is now resolved and his nose appears normal to him he declines imaging At this point he does not have any sx of a concussion either- no fatigue, he does not feel slowed He will watch for any sx of concern- see pt instructions- and seek care if needed Td today Labs pending  BP Readings from Last 3 Encounters:  02/15/16 118/70  01/05/16 140/74  09/18/15 106/70     Signed Lamar Blinks, MD

## 2016-02-16 LAB — COMPREHENSIVE METABOLIC PANEL
ALT: 17 U/L (ref 0–53)
AST: 12 U/L (ref 0–37)
Albumin: 4.5 g/dL (ref 3.5–5.2)
Alkaline Phosphatase: 45 U/L (ref 39–117)
BILIRUBIN TOTAL: 0.6 mg/dL (ref 0.2–1.2)
BUN: 21 mg/dL (ref 6–23)
CALCIUM: 10.1 mg/dL (ref 8.4–10.5)
CHLORIDE: 110 meq/L (ref 96–112)
CO2: 26 meq/L (ref 19–32)
Creatinine, Ser: 1.13 mg/dL (ref 0.40–1.50)
GFR: 66.73 mL/min (ref >60.00)
Glucose, Bld: 117 mg/dL — ABNORMAL HIGH (ref 70–99)
POTASSIUM: 4.2 meq/L (ref 3.5–5.1)
Sodium: 145 mEq/L (ref 135–145)
Total Protein: 6.8 g/dL (ref 6.0–8.3)

## 2016-02-16 LAB — CBC
HEMATOCRIT: 40.6 % (ref 39.0–52.0)
HEMOGLOBIN: 13.5 g/dL (ref 13.0–17.0)
MCHC: 33.2 g/dL (ref 30.0–36.0)
MCV: 85.4 fl (ref 78.0–100.0)
PLATELETS: 279 10*3/uL (ref 150.0–400.0)
RBC: 4.75 Mil/uL (ref 4.22–5.81)
RDW: 14.7 % (ref 11.5–15.5)
WBC: 6.5 10*3/uL (ref 4.0–10.5)

## 2016-02-16 LAB — HEMOGLOBIN A1C: HEMOGLOBIN A1C: 6.4 % (ref 4.6–6.5)

## 2016-02-16 LAB — PSA: PSA: 1.36 ng/mL (ref 0.10–4.00)

## 2016-02-18 ENCOUNTER — Ambulatory Visit: Payer: Medicare Other | Admitting: Physical Therapy

## 2016-02-18 DIAGNOSIS — R262 Difficulty in walking, not elsewhere classified: Secondary | ICD-10-CM

## 2016-02-18 DIAGNOSIS — R269 Unspecified abnormalities of gait and mobility: Secondary | ICD-10-CM

## 2016-02-18 DIAGNOSIS — R296 Repeated falls: Secondary | ICD-10-CM | POA: Diagnosis not present

## 2016-02-18 DIAGNOSIS — M25661 Stiffness of right knee, not elsewhere classified: Secondary | ICD-10-CM | POA: Diagnosis not present

## 2016-02-18 DIAGNOSIS — R29898 Other symptoms and signs involving the musculoskeletal system: Secondary | ICD-10-CM

## 2016-02-18 DIAGNOSIS — M25561 Pain in right knee: Secondary | ICD-10-CM | POA: Diagnosis not present

## 2016-02-18 DIAGNOSIS — Z9181 History of falling: Secondary | ICD-10-CM

## 2016-02-18 NOTE — Therapy (Signed)
[facility] [address]  [place], [place], [address] Phone: [phone]   Fax:  [phone]  Physical Therapy Treatment  Patient Details  Name: Matthew Nash MRN: [id number] Date of Birth: 10-Oct-1936 Referring Provider: Brunetta Jeans, MD  Encounter Date: 02/18/2016      PT End of Session - 02/18/16 1026    Visit Number 9   Number of Visits 16   Date for PT Re-Evaluation 03/14/16   PT Start Time [id number]   PT Stop Time 1102   PT Time Calculation (min) 44 min   Activity Tolerance Patient tolerated treatment well   Behavior During Therapy [facility] for tasks assessed/performed      Past Medical History  Diagnosis Date  . BPH (benign prostatic hyperplasia)     hx s/p turp  . Hyperlipidemia     takes Simvastatin and Tricor daily-under control  . Joint pain     both knees  . Joint swelling     rt knee  . Chronic back pain     spondylosis  . Melanoma ([place])     right hand;basal cell carcinoma  . H/O hiatal hernia   . History of colon polyps   . Diverticulitis   . Neuropathy ([place])   . Prostate infection     currently taking macrobid  . Diarrhea     x 1 today  . History of chicken pox   . Urinary incontinence   . UTI (lower urinary tract infection)   . Back pain at L4-L5 level   . Arthritis     both knees  . DDD (degenerative disc disease), lumbar   . History of kidney stones   . Diabetes mellitus     takes Metformin and Victoza daily-under control  . Hepatitis     B-with Mono and jaundice 1960  . GERD (gastroesophageal reflux disease)     takes Omeprazole daily-under control  . Hypertension     takes Lisinopril daily-under control  . H/O measles   . Pneumonia     hx of  . Tinnitus     Past Surgical History  Procedure Laterality Date  . Shoulder arthroscopy w/ rotator cuff repair Left NOV 2010  . Transurethral resection of prostate  2006  . Cortisone injection    . I&d left knee Left 1958   states 22 times  . C5 and t1 bone chip removed   1986  . Cholecystectomy  1993  . Right shoulder surgery  2012    rotator cuff repair  . Colonoscopy  2011  . Lumbar laminectomy/decompression microdiscectomy Right 02/17/2014    Procedure: RIGHT LUMBAR TWO-THREE LAMINECTOMY;  Surgeon: Charlie Pitter, MD;  Location: [facility];  Service: Neurosurgery;  Laterality: Right;  right   . Knee arthroscopy Right 07/16/2014    Procedure: RIGHT ARTHROSCOPY KNEE WITH Medial and Lateral DEBRIDEMENT and chondroplasty;  Surgeon: Gearlean Alf, MD;  Location: [facility];  Service: Orthopedics;  Laterality: Right;  . Wisdom tooth extraction    . Basal cell carcinoma excision    . Vasectomy  1983  . Tonsillectomy  1942  . Cauterize inner nose  1957  . Total knee arthroplasty Right 05/27/2015    Procedure: RIGHT TOTAL KNEE ARTHROPLASTY;  Surgeon: Gaynelle Arabian, MD;  Location: [facility];  Service: Orthopedics;  Laterality: Right;    There were no vitals filed for this visit.  Visit Diagnosis:  Abnormality of gait  At high risk  for falls  Weakness of both lower extremities  Difficulty walking      Subjective Assessment - 02/18/16 1026    Subjective Pt fell while on fishing trip last weekend, hitting his head. Missed last PT visit to be evaluated for possible concussion. Negative for concussion, but positive for nasal fractue. Pt was casted for custom orthotics yesterday which should be ready on 03/04/16. Scheduled for L TKR on 05/30/16.   Currently in Pain? No/denies        Today's Treatment  TherEx/Neuro Heel-toe weight shift on blue foam pad 2 x 20 each way; intermittent 1 UE support  Marching in place on blue foam x20 Alternating 8" step-touch from blue foam x20, intermittent 1 UE support; gait belt Alternating Fwd step-over 6" foam roll emphasizing heel strike and eccentric control x15, intermittent HHA on counter Alternating Lat step-over 6" foam roll x10 High stepping over cones with  progressively increasing step length Bil Side-stepping on foam balance beam with intermittent UE support on counter x 3   Side-stepping on heels x 2   Side-stepping on toes x 2 Tandem gait fwd/back on foam balance beam x 4  Corner balance: standard stance, staggered stance, feet together, with head turns side / side, up / down, diagonals x 30 sec each way   Gait Dynamic gait    Varying cadence   Gaze shifting L/R/Up/Down   Sudden starts/stops   180 turn and stop (LOB into wall x 10)   Walking backward         PT Short Term Goals - 02/11/16 1350    PT SHORT TERM GOAL #1   Title independent with HEP (02/15/16)   Status Achieved   PT SHORT TERM GOAL #2   Title improve BERG balance score to >/= 36/56 for improved balance and decreased fall risk (02/15/16)   Status Achieved   PT SHORT TERM GOAL #3   Title assess benefit of AFO and initiate consult if appropriate (02/15/16)   Status Achieved   PT SHORT TERM GOAL #4   Title assess gait velocity and timed up and go with LTGs to be written (02/15/16)   Status Achieved           PT Long Term Goals - 02/18/16 1236    PT LONG TERM GOAL #1   Title verbalize understanding of fall prevention strategies (03/14/16)   Status On-going   PT LONG TERM GOAL #2   Title improve BERG balance score to >/= 45/56 for improved balance (03/14/16)   Status On-going   PT LONG TERM GOAL #3   Title timed up and go:  <13 sec  (03/14/16)   Status On-going   PT LONG TERM GOAL #4   Title gait velocity: > 2.62 ft/sec    (03/14/16)   Status On-going               Plan - 02/18/16 1231    Clinical Impression Statement Pt very impulsive with therapeutic activities creating increased risk for fall and requiring close supervision/guarding. Demonstrates tendency for scissoring gait especially under increased attentional demands requirng cues for increased safety awareness. Patient fitted for B orthotics which should be available on 03/04/16 and pt to return for a  few visits afterwards to ensure safe gait pattern and anticipated benefit from AFO's.   PT Next Visit Plan Evaluate respone to new AFO's; Continue with activities to promote increased hip / knee flexion, DF, to improve LE clearance.     Consulted and Agree  with Plan of Care Patient        Problem List Patient Active Problem List   Diagnosis Date Noted  . OA (osteoarthritis) of knee 05/27/2015  . Diabetes mellitus type II, controlled ([place]) 11/26/2014  . Gastroesophageal reflux disease without esophagitis 11/26/2014  . Arthritis of both knees 11/26/2014  . Absence of bladder continence 11/26/2014  . Hyperlipidemia LDL goal <100 11/26/2014  . Acute medial meniscal tear 07/16/2014  . Lumbosacral spondylosis without myelopathy 02/17/2014  . Spondylosis, lumbosacral 02/17/2014    Percival Spanish, PT, MPT 02/18/2016, 12:37 PM  [facility] [address]  [place], [place], [address] Phone: [phone]   Fax:  [phone]  Name: BAXTON WITTBRODT MRN: [id number] Date of Birth: [birth year]

## 2016-02-26 ENCOUNTER — Telehealth: Payer: Self-pay

## 2016-02-26 NOTE — Telephone Encounter (Signed)
Patient called stated he would like to switch to Dr. Lorelei Pont told the patient I would have to have it okayed by his current pcp

## 2016-02-27 NOTE — Telephone Encounter (Signed)
That is fine with me.

## 2016-02-29 ENCOUNTER — Encounter: Payer: Self-pay | Admitting: Family Medicine

## 2016-02-29 DIAGNOSIS — M1712 Unilateral primary osteoarthritis, left knee: Secondary | ICD-10-CM

## 2016-02-29 MED ORDER — CELECOXIB 200 MG PO CAPS: 200.0000 mg | ORAL_CAPSULE | Freq: Two times a day (BID) | ORAL | Status: DC

## 2016-02-29 NOTE — Telephone Encounter (Signed)
Patient needing 90 day supply and has been okayed to change providers

## 2016-03-08 ENCOUNTER — Ambulatory Visit: Payer: Medicare Other | Admitting: Physical Therapy

## 2016-03-08 DIAGNOSIS — R296 Repeated falls: Secondary | ICD-10-CM | POA: Diagnosis not present

## 2016-03-08 DIAGNOSIS — M25661 Stiffness of right knee, not elsewhere classified: Secondary | ICD-10-CM | POA: Diagnosis not present

## 2016-03-08 DIAGNOSIS — R29898 Other symptoms and signs involving the musculoskeletal system: Secondary | ICD-10-CM | POA: Diagnosis not present

## 2016-03-08 DIAGNOSIS — R262 Difficulty in walking, not elsewhere classified: Secondary | ICD-10-CM

## 2016-03-08 DIAGNOSIS — R269 Unspecified abnormalities of gait and mobility: Secondary | ICD-10-CM

## 2016-03-08 DIAGNOSIS — Z9181 History of falling: Secondary | ICD-10-CM

## 2016-03-08 DIAGNOSIS — M25561 Pain in right knee: Secondary | ICD-10-CM | POA: Diagnosis not present

## 2016-03-08 NOTE — Therapy (Signed)
[facility] [address]  [place], [place], [address] Phone: [phone]   Fax:  [phone]  Physical Therapy Treatment  Patient Details  Name: Matthew Nash MRN: [id number] Date of Birth: [birth year] Referring Provider: Brunetta Jeans, MD  Encounter Date: 03/08/2016      PT End of Session - 03/08/16 0952    Visit Number 10   Number of Visits 16   Date for PT Re-Evaluation 03/14/16   PT Start Time 0935   PT Stop Time 1016   PT Time Calculation (min) 41 min   Activity Tolerance Patient tolerated treatment well   Behavior During Therapy [name] for tasks assessed/performed      Past Medical History  Diagnosis Date  . BPH (benign prostatic hyperplasia)     hx s/p turp  . Hyperlipidemia     takes Simvastatin and Tricor daily-under control  . Joint pain     both knees  . Joint swelling     rt knee  . Chronic back pain     spondylosis  . Melanoma ([place])     right hand;basal cell carcinoma  . H/O hiatal hernia   . History of colon polyps   . Diverticulitis   . Neuropathy ([place])   . Prostate infection     currently taking macrobid  . Diarrhea     x 1 today  . History of chicken pox   . Urinary incontinence   . UTI (lower urinary tract infection)   . Back pain at L4-L5 level   . Arthritis     both knees  . DDD (degenerative disc disease), lumbar   . History of kidney stones   . Diabetes mellitus     takes Metformin and Victoza daily-under control  . Hepatitis     B-with Mono and jaundice 1960  . GERD (gastroesophageal reflux disease)     takes Omeprazole daily-under control  . Hypertension     takes Lisinopril daily-under control  . H/O measles   . Pneumonia     hx of  . Tinnitus     Past Surgical History  Procedure Laterality Date  . Shoulder arthroscopy w/ rotator cuff repair Left NOV 2010  . Transurethral resection of prostate  2006  . Cortisone injection    . I&d left knee Left 1958   states 22 times  . C5 and t1 bone chip removed   1986  . Cholecystectomy  1993  . Right shoulder surgery  2012    rotator cuff repair  . Colonoscopy  2011  . Lumbar laminectomy/decompression microdiscectomy Right 02/17/2014    Procedure: RIGHT LUMBAR TWO-THREE LAMINECTOMY;  Surgeon: Charlie Pitter, MD;  Location: [facility];  Service: Neurosurgery;  Laterality: Right;  right   . Knee arthroscopy Right 07/16/2014    Procedure: RIGHT ARTHROSCOPY KNEE WITH Medial and Lateral DEBRIDEMENT and chondroplasty;  Surgeon: Gearlean Alf, MD;  Location: [facility];  Service: Orthopedics;  Laterality: Right;  . Wisdom tooth extraction    . Basal cell carcinoma excision    . Vasectomy  1983  . Tonsillectomy  1942  . Cauterize inner nose  1957  . Total knee arthroplasty Right 05/27/2015    Procedure: RIGHT TOTAL KNEE ARTHROPLASTY;  Surgeon: Gaynelle Arabian, MD;  Location: [facility];  Service: Orthopedics;  Laterality: Right;    There were no vitals filed for this visit.  Visit Diagnosis:  Abnormality of gait  At high risk  for falls  Weakness of both lower extremities  Difficulty walking      Subjective Assessment - 03/08/16 0945    Subjective Pt received his AFO's on Friday and notes several areas of rubbing - anterior shin at site of strap crossing, along arch of L foot and on lateral aspect on 5th metatarsal. Pt to return to orthotist this afternoon for adjustments. Pt stating that he feels like his balance has deteriorated since his fall while on his weekend trip earlier this month.   Currently in Pain? No/denies            [facility] PT Assessment - 03/08/16 1020    Observation/Other Assessments   Focus on Therapeutic Outcomes (FOTO)  60% (40% limited)          Today's Treatment  TherEx/Neuro Alternating 8" step-touch from blue foam, intermittent 1 UE support; gait belt - multiple LOB (1 severe) requiring PT assist to prevent fall therefore transitioned to minitramp Minitramp with light HHA  on upright support:   Lateral weight shift x20   Heel-toe weight shift x20   Marching in place x20   Staggered stance weight shift front/back x20, Bil High stepping over cones with progressively increasing step length Alternating Fwd step-over 6" foam roll emphasizing heel strike and eccentric control x15, intermittent HHA on counter Alternating Lat step-over 6" foam roll x10 Step with weight shift and reach to targets placed high and low on wall          PT Short Term Goals - 02/11/16 1350    PT SHORT TERM GOAL #1   Title independent with HEP (02/15/16)   Status Achieved   PT SHORT TERM GOAL #2   Title improve BERG balance score to >/= 36/56 for improved balance and decreased fall risk (02/15/16)   Status Achieved   PT SHORT TERM GOAL #3   Title assess benefit of AFO and initiate consult if appropriate (02/15/16)   Status Achieved   PT SHORT TERM GOAL #4   Title assess gait velocity and timed up and go with LTGs to be written (02/15/16)   Status Achieved           PT Long Term Goals - 02/18/16 1236    PT LONG TERM GOAL #1   Title verbalize understanding of fall prevention strategies (03/14/16)   Status On-going   PT LONG TERM GOAL #2   Title improve BERG balance score to >/= 45/56 for improved balance (03/14/16)   Status On-going   PT LONG TERM GOAL #3   Title timed up and go:  <13 sec  (03/14/16)   Status On-going   PT LONG TERM GOAL #4   Title gait velocity: > 2.62 ft/sec    (03/14/16)   Status On-going               Plan - 03/08/16 1427    Clinical Impression Statement Pt appears frustrated currently, both with new AFO's and with preceived regression in relation to balance. Pt reporting new AFO's are rubbing, causing redness and skin breakdown where B calf straps cross on anterior shin, L foot along arch of foot and R foot along lateral aspect of 5th metatarsal. Pt allowed PT to inspect anterior shins but refused to remove shoes and braces to allow for inspection of  either foot, stating he was returning to the orthotist this afternoon for adjustments. Pt cautioned to avoid pulling calf strap so tightly over shins as extra space in calf cuff presumably designed  to allow for fluctuations in LE edema and not intended to be snug against calf, but pt proceeding to pull straps as tight as possible. Pt remains impulsive during standing, balance and gait activities requiring close guarding to prevent falls. Given perceived regression of balance since fall will reassess Berg, TUG and gait speed at next visit to determine need for potential recert.   PT Next Visit Plan Evaluate response to  AFO modifications; Reassess Berg, TUG and gait speed; Determine need for recert vs D/C    Consulted and Agree with Plan of Care Patient          G-Codes - April 07, 2016 1216    Functional Assessment Tool Used BERG 42/56 (25% limitation)   Functional Limitation Mobility: Walking and moving around   Mobility: Walking and Moving Around Current Status [id number]) At least 20 percent but less than 40 percent impaired, limited or restricted   Mobility: Walking and Moving Around Goal Status [phone]) At least 20 percent but less than 40 percent impaired, limited or restricted      Problem List Patient Active Problem List   Diagnosis Date Noted  . OA (osteoarthritis) of knee 05/27/2015  . Diabetes mellitus type II, controlled ([place]) 11/26/2014  . Gastroesophageal reflux disease without esophagitis 11/26/2014  . Arthritis of both knees 11/26/2014  . Absence of bladder continence 11/26/2014  . Hyperlipidemia LDL goal <100 11/26/2014  . Acute medial meniscal tear 07/16/2014  . Lumbosacral spondylosis without myelopathy 02/17/2014  . Spondylosis, lumbosacral 02/17/2014    Percival Spanish, PT, MPT 07-Apr-2016, 2:46 PM  [facility] [address]  [place], [place], [address] Phone: [phone]   Fax:  [phone]  Name: Matthew Nash MRN: [id number] Date of Birth: 12/24/1935

## 2016-03-09 ENCOUNTER — Ambulatory Visit: Payer: Medicare Other | Admitting: Physical Therapy

## 2016-03-09 ENCOUNTER — Telehealth: Payer: Self-pay | Admitting: Family Medicine

## 2016-03-09 DIAGNOSIS — R29898 Other symptoms and signs involving the musculoskeletal system: Secondary | ICD-10-CM | POA: Diagnosis not present

## 2016-03-09 DIAGNOSIS — M25561 Pain in right knee: Secondary | ICD-10-CM | POA: Diagnosis not present

## 2016-03-09 DIAGNOSIS — R269 Unspecified abnormalities of gait and mobility: Secondary | ICD-10-CM | POA: Diagnosis not present

## 2016-03-09 DIAGNOSIS — M25661 Stiffness of right knee, not elsewhere classified: Secondary | ICD-10-CM | POA: Diagnosis not present

## 2016-03-09 DIAGNOSIS — Z9181 History of falling: Secondary | ICD-10-CM

## 2016-03-09 DIAGNOSIS — R262 Difficulty in walking, not elsewhere classified: Secondary | ICD-10-CM | POA: Diagnosis not present

## 2016-03-09 DIAGNOSIS — R296 Repeated falls: Secondary | ICD-10-CM | POA: Diagnosis not present

## 2016-03-09 NOTE — Telephone Encounter (Signed)
Appointment Request From: Nita Sells       With Provider: Lamar Blinks, [place] at [facility]]      Preferred Date Range: Any date 03/22/2016 or later      Reason: To address the following health maintenance concerns.   Ophthalmology Exam      Comments:   Eye exam is scheduled with [facility] for April 11.  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

## 2016-03-09 NOTE — Telephone Encounter (Signed)
Called patient to schedule apoointment with Dr. Bard Herbert wife patient is not available, Advised to have patient return call to schedule appointment.

## 2016-03-09 NOTE — Therapy (Signed)
[facility] [address]  [place], [place], [address] Phone: [phone]   Fax:  [phone]  Physical Therapy Treatment  Patient Details  Name: Matthew Nash MRN: [id number] Date of Birth: Jan 30, 1936 Referring Provider: Brunetta Jeans, MD  Encounter Date: 03/09/2016      PT End of Session - 03/09/16 1058    Visit Number 11   Number of Visits 16   Date for PT Re-Evaluation 03/14/16   PT Start Time 5916   PT Stop Time 1056   PT Time Calculation (min) 41 min   Activity Tolerance Patient tolerated treatment well   Behavior During Therapy [facility] for tasks assessed/performed      Past Medical History  Diagnosis Date  . BPH (benign prostatic hyperplasia)     hx s/p turp  . Hyperlipidemia     takes Simvastatin and Tricor daily-under control  . Joint pain     both knees  . Joint swelling     rt knee  . Chronic back pain     spondylosis  . Melanoma ([place])     right hand;basal cell carcinoma  . H/O hiatal hernia   . History of colon polyps   . Diverticulitis   . Neuropathy ([place])   . Prostate infection     currently taking macrobid  . Diarrhea     x 1 today  . History of chicken pox   . Urinary incontinence   . UTI (lower urinary tract infection)   . Back pain at L4-L5 level   . Arthritis     both knees  . DDD (degenerative disc disease), lumbar   . History of kidney stones   . Diabetes mellitus     takes Metformin and Victoza daily-under control  . Hepatitis     B-with Mono and jaundice 1960  . GERD (gastroesophageal reflux disease)     takes Omeprazole daily-under control  . Hypertension     takes Lisinopril daily-under control  . H/O measles   . Pneumonia     hx of  . Tinnitus     Past Surgical History  Procedure Laterality Date  . Shoulder arthroscopy w/ rotator cuff repair Left NOV 2010  . Transurethral resection of prostate  2006  . Cortisone injection    . I&d left knee Left 1958   states 22 times  . C5 and t1 bone chip removed   1986  . Cholecystectomy  1993  . Right shoulder surgery  2012    rotator cuff repair  . Colonoscopy  2011  . Lumbar laminectomy/decompression microdiscectomy Right 02/17/2014    Procedure: RIGHT LUMBAR TWO-THREE LAMINECTOMY;  Surgeon: Charlie Pitter, MD;  Location: [facility];  Service: Neurosurgery;  Laterality: Right;  right   . Knee arthroscopy Right 07/16/2014    Procedure: RIGHT ARTHROSCOPY KNEE WITH Medial and Lateral DEBRIDEMENT and chondroplasty;  Surgeon: Gearlean Alf, MD;  Location: [facility];  Service: Orthopedics;  Laterality: Right;  . Wisdom tooth extraction    . Basal cell carcinoma excision    . Vasectomy  1983  . Tonsillectomy  1942  . Cauterize inner nose  1957  . Total knee arthroplasty Right 05/27/2015    Procedure: RIGHT TOTAL KNEE ARTHROPLASTY;  Surgeon: Gaynelle Arabian, MD;  Location: [facility];  Service: Orthopedics;  Laterality: Right;    There were no vitals filed for this visit.  Visit Diagnosis:  Abnormality of gait  At high risk  for falls  Weakness of both lower extremities  Difficulty walking      Subjective Assessment - 03/09/16 1019    Subjective Pt reports AFO's adjusted yesterday and not noticing any problems at present   Currently in Pain? No/denies            [facility] PT Assessment - 03/09/16 1015    Ambulation/Gait   Gait velocity 3.32 ft/sec  10.13"   Standardized Balance Assessment   Standardized Balance Assessment Berg Balance Test;Timed Up and Go Test   Berg Balance Test   Sit to Stand Able to stand without using hands and stabilize independently   Standing Unsupported Able to stand safely 2 minutes   Sitting with Back Unsupported but Feet Supported on Floor or Stool Able to sit safely and securely 2 minutes   Stand to Sit Sits safely with minimal use of hands   Transfers Able to transfer safely, minor use of hands   Standing Unsupported with Eyes Closed Able to stand 10 seconds with  supervision   Standing Ubsupported with Feet Together Able to place feet together independently and stand 1 minute safely   From Standing, Reach Forward with Outstretched Arm Can reach confidently >25 cm (10")   From Standing Position, Pick up Object from Floor Able to pick up shoe, needs supervision   From Standing Position, Turn to Look Behind Over each Shoulder Looks behind one side only/other side shows less weight shift   Turn 360 Degrees Able to turn 360 degrees safely one side only in 4 seconds or less   Standing Unsupported, Alternately Place Feet on Step/Stool Able to complete 4 steps without aid or supervision   Standing Unsupported, One Foot in Front Able to plae foot ahead of the other independently and hold 30 seconds   Standing on One Leg Able to lift leg independently and hold equal to or more than 3 seconds   Total Score 47   Timed Up and Go Test   TUG Normal TUG   Normal TUG (seconds) 9.13  13.85 with cane         Today's Treatment  TherEx NuStep - lvl 5 x 5'  Berg Balance test TUG Gait Speed assessment   Goal assessment  Review of home safety measures to reduce risk for fall.  Review of recommended home program, including exercises to help prepare for upcoming TKR:   Hamstring and Quad stretches, LAQ, TKE, HS curls, Bridging, 4 way SLR           PT Short Term Goals - 02/11/16 1350    PT SHORT TERM GOAL #1   Title independent with HEP (02/15/16)   Status Achieved   PT SHORT TERM GOAL #2   Title improve BERG balance score to >/= 36/56 for improved balance and decreased fall risk (02/15/16)   Status Achieved   PT SHORT TERM GOAL #3   Title assess benefit of AFO and initiate consult if appropriate (02/15/16)   Status Achieved   PT SHORT TERM GOAL #4   Title assess gait velocity and timed up and go with LTGs to be written (02/15/16)   Status Achieved           PT Long Term Goals - 03/09/16 1046    PT LONG TERM GOAL #1   Title verbalize  understanding of fall prevention strategies (03/14/16)   Status Achieved   PT LONG TERM GOAL #2   Title improve BERG balance score to >/= 45/56 for improved balance (  03/14/16)   Status Achieved  47/56   PT LONG TERM GOAL #3   Title timed up and go:  <13 sec  (03/14/16)   Status Achieved   PT LONG TERM GOAL #4   Title gait velocity: > 2.62 ft/sec    (03/14/16)   Status Achieved  TUG = 9.13 sec               Plan - 2016-03-13 1059    Clinical Impression Statement Pt reporting AFO adjustment seems to have relieved the areas of rubbing and states the orthotist confirmed that he was tightening the calf strap too tight. Encouraged pt to continue to monitor skin on daily basis and f/u with orthotist if any further areas where redness/irritation occur. Reassessed balance and gait stability with standardized tests with pt demonstrating improvement on all tests: Berg 47/56 (initally 28/56 and as of last assessment 42/56), TUG 9.13 sec w/o AD (initially 10.66" w/ SPC and 14.12" as of most recent assessment) and gait velocity 3.32 ft/sec (2.23 ft/sec as of last assessment). All goals met for this episode and upon further discussion with pt regarding balance issues at home, he admits impatience is often the biggest contributing factor to his episodes of LOB. Pt feels ready for D/C, therefore reviewed safety concerns to reduce risk for falls along with recommendations for continued HEP including recommended exercises to help him prepare for upcoming TKR scheduled for June.   PT Next Visit Plan Discharge   Consulted and Agree with Plan of Care Patient          G-Codes - Mar 13, 2016 1114    Functional Assessment Tool Used BERG 47/56 (16% limitation)   Functional Limitation Mobility: Walking and moving around   Mobility: Walking and Moving Around Goal Status [phone]) At least 20 percent but less than 40 percent impaired, limited or restricted   Mobility: Walking and Moving Around Discharge Status [phone]) At  least 1 percent but less than 20 percent impaired, limited or restricted      Problem List Patient Active Problem List   Diagnosis Date Noted  . OA (osteoarthritis) of knee 05/27/2015  . Diabetes mellitus type II, controlled ([place]) 11/26/2014  . Gastroesophageal reflux disease without esophagitis 11/26/2014  . Arthritis of both knees 11/26/2014  . Absence of bladder continence 11/26/2014  . Hyperlipidemia LDL goal <100 11/26/2014  . Acute medial meniscal tear 07/16/2014  . Lumbosacral spondylosis without myelopathy 02/17/2014  . Spondylosis, lumbosacral 02/17/2014    Percival Spanish, PT, MPT Mar 13, 2016, 11:22 AM  [facility] [address]  [place], [place], [address] Phone: [phone]   Fax:  [phone]  Name: EMRIK ERHARD MRN: [id number] Date of Birth: June 25, 1936    PHYSICAL THERAPY DISCHARGE SUMMARY  Visits from Start of Care: 11  Current functional level related to goals / functional outcomes:   Refer to above clinical impression.    Remaining deficits:   Impulsivity/decreased safety awareness   Education / Equipment:   HEP  Plan: Patient agrees to discharge.  Patient goals were met. Patient is being discharged due to meeting the stated rehab goals.  ?????       Percival Spanish, PT, MPT 2016-03-13, 11:22 AM  [facility] [address]  [place], [place], [address] Phone: [phone]   Fax:  [phone]

## 2016-03-10 ENCOUNTER — Ambulatory Visit: Payer: Medicare Other | Admitting: Physical Therapy

## 2016-03-15 ENCOUNTER — Encounter: Payer: Self-pay | Admitting: Family Medicine

## 2016-03-15 MED ORDER — LIRAGLUTIDE 18 MG/3ML ~~LOC~~ SOPN: 1.8000 mg | PEN_INJECTOR | Freq: Every day | SUBCUTANEOUS | Status: DC

## 2016-03-15 NOTE — Telephone Encounter (Signed)
Victoza Rx sent to [facility] as requested.

## 2016-03-17 ENCOUNTER — Encounter: Payer: Self-pay | Admitting: Family Medicine

## 2016-03-18 ENCOUNTER — Other Ambulatory Visit: Payer: Self-pay

## 2016-03-18 DIAGNOSIS — R351 Nocturia: Secondary | ICD-10-CM | POA: Diagnosis not present

## 2016-03-18 MED ORDER — LIRAGLUTIDE 18 MG/3ML ~~LOC~~ SOPN: 1.8000 mg | PEN_INJECTOR | Freq: Every day | SUBCUTANEOUS | Status: DC

## 2016-03-22 ENCOUNTER — Telehealth: Payer: Self-pay | Admitting: Family Medicine

## 2016-03-22 LAB — HM DIABETES EYE EXAM

## 2016-03-22 MED ORDER — LIRAGLUTIDE 18 MG/3ML ~~LOC~~ SOPN: 1.8000 mg | PEN_INJECTOR | Freq: Every day | SUBCUTANEOUS | Status: DC

## 2016-03-22 NOTE — Telephone Encounter (Signed)
Caller name: [place]  Relation to pt: [facility]  Call back number: [phone] and fax # [phone]    Reason for call: Requesting a 3 month supply Liraglutide 18 MG/3ML SOPN

## 2016-03-22 NOTE — Telephone Encounter (Signed)
Pt called also. He said he has 9 days left. Please send in for 90 day supply of victoza.

## 2016-03-23 ENCOUNTER — Other Ambulatory Visit: Payer: Self-pay

## 2016-03-23 ENCOUNTER — Telehealth: Payer: Self-pay | Admitting: *Deleted

## 2016-03-23 ENCOUNTER — Encounter: Payer: Self-pay | Admitting: *Deleted

## 2016-03-23 MED ORDER — LIRAGLUTIDE 18 MG/3ML ~~LOC~~ SOPN: 1.8000 mg | PEN_INJECTOR | Freq: Every day | SUBCUTANEOUS | Status: DC

## 2016-03-23 NOTE — Telephone Encounter (Signed)
Pre-Visit Call completed with patient and chart updated.   Pre-Visit Info documented in Specialty Comments under [employer].    

## 2016-03-24 ENCOUNTER — Ambulatory Visit (INDEPENDENT_AMBULATORY_CARE_PROVIDER_SITE_OTHER): Payer: Medicare Other | Admitting: Family Medicine

## 2016-03-24 VITALS — BP 124/62 | HR 68 | Temp 97.8°F | Ht 70.0 in | Wt 223.0 lb

## 2016-03-24 DIAGNOSIS — E119 Type 2 diabetes mellitus without complications: Secondary | ICD-10-CM

## 2016-03-24 DIAGNOSIS — I10 Essential (primary) hypertension: Secondary | ICD-10-CM

## 2016-03-24 DIAGNOSIS — G479 Sleep disorder, unspecified: Secondary | ICD-10-CM | POA: Diagnosis not present

## 2016-03-24 DIAGNOSIS — Z0000 Encounter for general adult medical examination without abnormal findings: Secondary | ICD-10-CM | POA: Diagnosis not present

## 2016-03-24 NOTE — Progress Notes (Signed)
Subjective:   Matthew Nash is a 80 y.o. male who presents for Medicare Annual/Subsequent preventive examination.  Review of Systems:  Here today for his medicare annual wellness exam.  I saw this pt last month after a fall- he was in an unfamiliar hotel room and had a fall when he awoke in the night and tried to walk to the bathroom Dm: he keeps track of his glucose readings. His average readings are around 100- 110 according to his meter.  He is on victoza and metformin.  He is NOT on insulin.  His recent A1c was very good and he would like to decrease his dose of victoza which is reasonable.  No SE of medications noted, no low glucose  He is on lisinopril- was dx with HTN after "a bad day" about 20 years ago.  He notes that his BP is always fine.  Wonders if he can decrease his dose here as well  His wife sent me a note- she is concerned that he tends to wake up "shouting in my sleep," he tends to punch and thrash around while he is asleep. He does have scary dreams some of the time.  No sleep walking- he does wake up sometimes and get out of bed.  He does snore on occasion but not severely.    He started on DDAVP for incontinence recently per DR. Tannenbaum; he hopes that this will help him.  He has to wear a depends at night and will change this twice during the night, and he will cath prior to going to bed as well.  This is a big concern for him as he cannot easily spend the night away from home  BP Readings from Last 3 Encounters:  03/24/16 124/62  02/15/16 118/70  01/05/16 140/74      Objective:    Vitals: BP 124/62 mmHg  Pulse 68  Temp(Src) 97.8 F (36.6 C) (Oral)  Ht 5\' 10"  (1.778 m)  Wt 223 lb (101.152 kg)  BMI 32.00 kg/m2  SpO2 96%  Body mass index is 32 kg/(m^2).  GEN: WDWN, NAD, Non-toxic, A & O x 3, obese, looks well HEENT: Atraumatic, Normocephalic. Neck supple. No masses, No LAD. Ears and Nose: No external deformity. CV: RRR, No M/G/R. No JVD. No thrill. No  extra heart sounds. PULM: CTA B, no wheezes, crackles, rhonchi. No retractions. No resp. distress. No accessory muscle use. ABD: S, NT, ND, +BS. No rebound. No HSM. EXTR: No c/c/e NEURO Normal gait.  PSYCH: Normally interactive. Conversant. Not depressed or anxious appearing.  Calm demeanor.   Tobacco History  Smoking status  . Former Smoker  . Types: Pipe  . Quit date: 07/14/1978  Smokeless tobacco  . Never Used    Comment: quit smoking 1979     Counseling given: Not Answered   Past Medical History  Diagnosis Date  . BPH (benign prostatic hyperplasia)     hx s/p turp  . Hyperlipidemia     takes Simvastatin and Tricor daily-under control  . Joint pain     both knees  . Joint swelling     rt knee  . Chronic back pain     spondylosis  . Melanoma ([place])     right hand;basal cell carcinoma  . H/O hiatal hernia   . History of colon polyps   . Diverticulitis   . Neuropathy ([place])   . Prostate infection     currently taking macrobid  . Diarrhea  x 1 today  . History of chicken pox   . Urinary incontinence   . UTI (lower urinary tract infection)   . Back pain at L4-L5 level   . Arthritis     both knees  . DDD (degenerative disc disease), lumbar   . History of kidney stones   . Diabetes mellitus     takes Metformin and Victoza daily-under control  . Hepatitis     B-with Mono and jaundice 1960  . GERD (gastroesophageal reflux disease)     takes Omeprazole daily-under control  . Hypertension     takes Lisinopril daily-under control  . H/O measles   . Pneumonia     hx of  . Tinnitus    Past Surgical History  Procedure Laterality Date  . Shoulder arthroscopy w/ rotator cuff repair Left NOV 2010  . Transurethral resection of prostate  2006  . Cortisone injection    . I&d left knee Left 1958    states 22 times  . C5 and t1 bone chip removed   1986  . Cholecystectomy  1993  . Right shoulder surgery  2012    rotator cuff repair  . Colonoscopy  2011  . Lumbar  laminectomy/decompression microdiscectomy Right 02/17/2014    Procedure: RIGHT LUMBAR TWO-THREE LAMINECTOMY;  Surgeon: Charlie Pitter, MD;  Location: [facility];  Service: Neurosurgery;  Laterality: Right;  right   . Knee arthroscopy Right 07/16/2014    Procedure: RIGHT ARTHROSCOPY KNEE WITH Medial and Lateral DEBRIDEMENT and chondroplasty;  Surgeon: Gearlean Alf, MD;  Location: [facility];  Service: Orthopedics;  Laterality: Right;  . Wisdom tooth extraction    . Basal cell carcinoma excision    . Vasectomy  1983  . Tonsillectomy  1942  . Cauterize inner nose  1957  . Total knee arthroplasty Right 05/27/2015    Procedure: RIGHT TOTAL KNEE ARTHROPLASTY;  Surgeon: Gaynelle Arabian, MD;  Location: [facility];  Service: Orthopedics;  Laterality: Right;   Family History  Problem Relation Age of Onset  . Heart disease Mother 41    Deceased  . Bladder Cancer Mother   . Prostate cancer Father     Deceased-in 36s  . Healthy Brother   . Skin cancer Brother     "basal cell cancer in the bloodstream"  . Asthma Sister     Deceased  . Healthy Son     #1  . Healthy Daughter     #2  . Obesity Son     #2   History  Sexual Activity  . Sexual Activity: Yes    Outpatient Encounter Prescriptions as of 03/24/2016  Medication Sig  . aspirin 81 MG tablet Take 81 mg by mouth daily.  . Calcium Carb-Cholecalciferol (CALCIUM-VITAMIN D3) 600-400 MG-UNIT TABS Take 1 each by mouth daily.  . celecoxib (CELEBREX) 200 MG capsule Take 1 capsule (200 mg total) by mouth 2 (two) times daily.  [name] Kitchen desmopressin (DDAVP) 0.1 MG tablet Take 0.1 mg by mouth at bedtime.  . fenofibrate (TRICOR) 145 MG tablet Take 1 tablet (145 mg total) by mouth every evening.  [name] Kitchen glucose blood (FREESTYLE LITE) test strip Use as instructed to test Blood Glucose twice daily Dx: E11.9  . Insulin Pen Needle (CAREFINE PEN NEEDLES) 31G X 8 MM MISC Use once daily with Victoza injections.  . Lancets (FREESTYLE) lancets 1 each by Other route 2 (two) times  daily. Use as instructed  . Liraglutide 18 MG/3ML SOPN Inject 0.3 mLs (1.8 mg total)  into the skin daily.  [name] Kitchen lisinopril (PRINIVIL,ZESTRIL) 10 MG tablet Take 1 tablet (10 mg total) by mouth every evening.  . metFORMIN (GLUCOPHAGE-XR) 500 MG 24 hr tablet Take 1 tablet (500 mg total) by mouth 2 (two) times daily with a meal.  . Multiple Vitamins-Minerals (MENS MULTIVITAMIN PLUS) TABS Take by mouth.  [name] Kitchen omeprazole (PRILOSEC) 20 MG capsule Take 1 capsule (20 mg total) by mouth daily.  . Probiotic Product (PROBIOTIC PO) Take 1 capsule by mouth daily.  . simvastatin (ZOCOR) 10 MG tablet Take 1 tablet (10 mg total) by mouth at bedtime.  . tamsulosin (FLOMAX) 0.4 MG CAPS capsule Take 0.4 mg by mouth every evening.   . vitamin C (ASCORBIC ACID) 500 MG tablet Take 500 mg by mouth daily.  . [DISCONTINUED] DUREZOL 0.05 % EMUL Reported on 03/23/2016  . [DISCONTINUED] ILEVRO 0.3 % ophthalmic suspension Reported on 03/23/2016   No facility-administered encounter medications on file as of 03/24/2016.    Activities of Daily Living In your present state of health, do you have any difficulty performing the following activities: 02/15/2016 05/27/2015  Hearing? N N  Vision? N N  Difficulty concentrating or making decisions? N N  Walking or climbing stairs? N Y  Dressing or bathing? N N  Doing errands, shopping? N N    Patient Care Team: Darreld Mclean, MD as PCP - General (Family Medicine) Gaynelle Arabian, MD as Consulting Physician (Orthopedic Surgery) Deirdre Pippins, PA-C as Physician Assistant (Dermatology) Laurence Aly, OD (Optometry) Carolan Clines, MD as Consulting Physician (Urology)   Assessment:    Routine history and physical examination of adult  Controlled type 2 diabetes mellitus without complication, without long-term current use of insulin [facility])  Essential hypertension  Sleep disorder    Exercise Activities and Dietary recommendations  continue to stay active.   DM is under good  control  Goals    None     Fall Risk Fall Risk  02/15/2016 03/20/2015  Falls in the past year? Yes Yes  Number falls in past yr: 1 2 or more  Injury with Fall? Yes No  Risk Factor Category  High Fall Risk -  Risk for fall due to : Impaired balance/gait;Impaired vision Impaired balance/gait  Risk for fall due to (comments): Up to use the bathroom lost balance -  Follow up Falls prevention discussed -   Depression Screen PHQ 2/9 Scores 02/15/2016  PHQ - 2 Score 0  Exception Documentation Patient refusal    Cognitive Testing No flowsheet data found.  Immunization History  Administered Date(s) Administered  . Influenza,inj,Quad PF,36+ Mos 08/20/2015  . Influenza-Unspecified 09/11/2014  . Pneumococcal Conjugate-13 05/13/2015  . Td 02/15/2016   Screening Tests Health Maintenance  Topic Date Due  . FOOT EXAM  03/19/2016  . PNA vac Low Risk Adult (2 of 2 - PPSV23) 05/12/2016  . INFLUENZA VACCINE  07/12/2016  . HEMOGLOBIN A1C  08/17/2016  . OPHTHALMOLOGY EXAM  03/22/2017  . TETANUS/TDAP  02/14/2026  . ZOSTAVAX  Addressed      Plan:    Decrease victoza to 1.2mg  per dose Decrease his lisinopril to 5mg  daily Pt is not really concerned about his sleep, but his wife is.  Suggested that he try melatonin to help him get more restfull sleep without being overly sedated. PSA checked last month- normal Eye exam is UTD He will see me in 2 months for a pre-op exam prior to knee surgery   During the course of the visit the patient was  educated and counseled about the following appropriate screening and preventive services:   Vaccines to include Pneumoccal, Influenza, Hepatitis B, Td, Zostavax, HCV  Electrocardiogram  Cardiovascular Disease  Colorectal cancer screening  Diabetes screening  Prostate Cancer Screening  Glaucoma screening  Nutrition counseling   Smoking cessation counseling  Patient Instructions (the written plan) was given to the patient.  It was very nice  to see you today- keep up the good work with your blood sugar control Let's decrease your lisinopril to 5mg  a day, and also decrease your victoza to 1.2mg  Let me know if your glucose starts going up  Try some melatonin OTC- this may be helpful for sleep and may help you sleep more restfully.    Please come and see me in 2-3 months to check on your diabetes and do your pre-op.    Lamar Blinks, MD  03/24/2016

## 2016-03-24 NOTE — Progress Notes (Signed)
Pre visit review using our clinic review tool, if applicable. No additional management support is needed unless otherwise documented below in the visit note. 

## 2016-03-24 NOTE — Patient Instructions (Addendum)
It was very nice to see you today- keep up the good work with your blood sugar control Let's decrease your lisinopril to 5mg  a day, and also decrease your victoza to 1.2mg  Let me know if your glucose starts going up  Try some melatonin OTC- this may be helpful for sleep and may help you sleep more restfully.    Please come and see me in 2-3 months to check on your diabetes and do your pre-op.

## 2016-03-28 DIAGNOSIS — R351 Nocturia: Secondary | ICD-10-CM | POA: Diagnosis not present

## 2016-04-04 ENCOUNTER — Encounter: Payer: Self-pay | Admitting: Family Medicine

## 2016-04-08 DIAGNOSIS — R351 Nocturia: Secondary | ICD-10-CM | POA: Diagnosis not present

## 2016-04-26 DIAGNOSIS — N3020 Other chronic cystitis without hematuria: Secondary | ICD-10-CM | POA: Diagnosis not present

## 2016-04-26 DIAGNOSIS — N3942 Incontinence without sensory awareness: Secondary | ICD-10-CM | POA: Diagnosis not present

## 2016-04-26 DIAGNOSIS — N320 Bladder-neck obstruction: Secondary | ICD-10-CM | POA: Diagnosis not present

## 2016-04-26 DIAGNOSIS — Z0000 Encounter for general adult medical examination without abnormal findings: Secondary | ICD-10-CM | POA: Diagnosis not present

## 2016-05-02 ENCOUNTER — Encounter: Payer: Self-pay | Admitting: Family Medicine

## 2016-05-02 DIAGNOSIS — N320 Bladder-neck obstruction: Secondary | ICD-10-CM | POA: Insufficient documentation

## 2016-05-10 ENCOUNTER — Ambulatory Visit: Payer: Medicare Other | Admitting: Physician Assistant

## 2016-05-14 ENCOUNTER — Ambulatory Visit: Payer: Self-pay | Admitting: Orthopedic Surgery

## 2016-05-16 ENCOUNTER — Encounter: Payer: Self-pay | Admitting: Family Medicine

## 2016-05-18 ENCOUNTER — Ambulatory Visit (INDEPENDENT_AMBULATORY_CARE_PROVIDER_SITE_OTHER): Payer: Medicare Other | Admitting: Family Medicine

## 2016-05-18 ENCOUNTER — Encounter: Payer: Self-pay | Admitting: Family Medicine

## 2016-05-18 VITALS — BP 118/68 | HR 82 | Temp 98.4°F | Ht 70.0 in | Wt 222.8 lb

## 2016-05-18 DIAGNOSIS — E119 Type 2 diabetes mellitus without complications: Secondary | ICD-10-CM

## 2016-05-18 DIAGNOSIS — E785 Hyperlipidemia, unspecified: Secondary | ICD-10-CM

## 2016-05-18 DIAGNOSIS — Z01810 Encounter for preprocedural cardiovascular examination: Secondary | ICD-10-CM | POA: Diagnosis not present

## 2016-05-18 LAB — CBC
HCT: 40.1 % (ref 39.0–52.0)
Hemoglobin: 13.3 g/dL (ref 13.0–17.0)
MCHC: 33.2 g/dL (ref 30.0–36.0)
MCV: 85.4 fl (ref 78.0–100.0)
PLATELETS: 250 10*3/uL (ref 150.0–400.0)
RBC: 4.69 Mil/uL (ref 4.22–5.81)
RDW: 13.9 % (ref 11.5–15.5)
WBC: 6.7 10*3/uL (ref 4.0–10.5)

## 2016-05-18 LAB — BASIC METABOLIC PANEL
BUN: 21 mg/dL (ref 6–23)
CO2: 28 meq/L (ref 19–32)
Calcium: 10 mg/dL (ref 8.4–10.5)
Chloride: 108 mEq/L (ref 96–112)
Creatinine, Ser: 1.04 mg/dL (ref 0.40–1.50)
GFR: 73.39 mL/min (ref >60.00)
GLUCOSE: 98 mg/dL (ref 70–99)
POTASSIUM: 4.9 meq/L (ref 3.5–5.1)
Sodium: 141 mEq/L (ref 135–145)

## 2016-05-18 LAB — LIPID PANEL
CHOL/HDL RATIO: 3
Cholesterol: 113 mg/dL (ref 0–200)
HDL: 43.4 mg/dL (ref >39.00)
LDL CALC: 52 mg/dL (ref 0–99)
NONHDL: 69.88
Triglycerides: 90 mg/dL (ref 0.0–149.0)
VLDL: 18 mg/dL (ref 0.0–40.0)

## 2016-05-18 LAB — HEMOGLOBIN A1C: HEMOGLOBIN A1C: 6.3 % (ref 4.6–6.5)

## 2016-05-18 MED ORDER — LISINOPRIL 10 MG PO TABS: 10.0000 mg | ORAL_TABLET | Freq: Every evening | ORAL | Status: DC

## 2016-05-18 MED ORDER — METFORMIN HCL ER 500 MG PO TB24: 500.0000 mg | ORAL_TABLET | Freq: Two times a day (BID) | ORAL | Status: DC

## 2016-05-18 MED ORDER — FENOFIBRATE 145 MG PO TABS: 145.0000 mg | ORAL_TABLET | Freq: Every evening | ORAL | Status: DC

## 2016-05-18 NOTE — Progress Notes (Signed)
[facility] at [facility] [address] [place], [place] [address] [phone]  Date:  05/18/2016   Name:  Matthew Nash   DOB:  July 08, 1936   MRN:  [id number]  PCP:  Lamar Blinks, MD    Chief Complaint: Surgical Clearance   History of Present Illness:  Matthew Nash is a 80 y.o. very pleasant male patient who presents with the following:  Here today seeking surgical clearance for a planned knee operation- he had his left knee on the schedule for  05/30/16 per Dr. Ricki Rodriguez.  He had his right knee done about a year ago.  He came through his operation well and without any complications  History of DM which is well controlled  He did have a stress test years ago - ETT, this was negative. He is not quite sure why he needed to have it done.    No CP with activities, he is able to climb stairs, etc.  He is able to do housework, carry groceries without any chest pain.  His knees do somewhat limit his activities but he states he could walk 4 blocks with some knee pain.    He is fasting today except for a little V8 juice Lab Results  Component Value Date   HGBA1C 6.4 02/15/2016   He has a couple of trips planned this fall- HS reunion and [employer] reunion  He continues to be very bothered by his bladder incontinence.  He does self cath but will still sometimes wet the bed at night which is frustrating to him  Patient Active Problem List   Diagnosis Date Noted  . Bladder outlet obstruction 05/02/2016  . OA (osteoarthritis) of knee 05/27/2015  . Diabetes mellitus type II, controlled ([place]) 11/26/2014  . Gastroesophageal reflux disease without esophagitis 11/26/2014  . Arthritis of both knees 11/26/2014  . Absence of bladder continence 11/26/2014  . Hyperlipidemia LDL goal <100 11/26/2014  . Acute medial meniscal tear 07/16/2014  . Lumbosacral spondylosis without myelopathy 02/17/2014  . Spondylosis, lumbosacral 02/17/2014    Past  Medical History  Diagnosis Date  . BPH (benign prostatic hyperplasia)     hx s/p turp  . Hyperlipidemia     takes Simvastatin and Tricor daily-under control  . Joint pain     both knees  . Joint swelling     rt knee  . Chronic back pain     spondylosis  . Melanoma ([place])     right hand;basal cell carcinoma  . H/O hiatal hernia   . History of colon polyps   . Diverticulitis   . Neuropathy ([place])   . Prostate infection     currently taking macrobid  . Diarrhea     x 1 today  . History of chicken pox   . Urinary incontinence   . UTI (lower urinary tract infection)   . Back pain at L4-L5 level   . Arthritis     both knees  . DDD (degenerative disc disease), lumbar   . History of kidney stones   . Diabetes mellitus     takes Metformin and Victoza daily-under control  . Hepatitis     B-with Mono and jaundice 1960  . GERD (gastroesophageal reflux disease)     takes Omeprazole daily-under control  . Hypertension     takes Lisinopril daily-under control  . H/O measles   . Pneumonia     hx of  . Tinnitus  Past Surgical History  Procedure Laterality Date  . Shoulder arthroscopy w/ rotator cuff repair Left NOV 2010  . Transurethral resection of prostate  2006  . Cortisone injection    . I&d left knee Left 1958    states 22 times  . C5 and t1 bone chip removed   1986  . Cholecystectomy  1993  . Right shoulder surgery  2012    rotator cuff repair  . Colonoscopy  2011  . Lumbar laminectomy/decompression microdiscectomy Right 02/17/2014    Procedure: RIGHT LUMBAR TWO-THREE LAMINECTOMY;  Surgeon: Charlie Pitter, MD;  Location: [place] NEUR[facility] OR[facility];  Service: Neurosurgery;  Laterality: Right;  right   . Knee arthroscopy Right 07/16/2014    Procedure: RIGHT ARTHROSCOPY KNEE WITH Medial and Lateral DEBRIDEMENT and chondroplasty;  Surgeon: Gearlean Alf, MD;  Location: [facility];  Service: Orthopedics;  Laterality: Right;  . Wisdom tooth extraction    . Basal cell carcinoma excision    .  Vasectomy  1983  . Tonsillectomy  1942  . Cauterize inner nose  1957  . Total knee arthroplasty Right 05/27/2015    Procedure: RIGHT TOTAL KNEE ARTHROPLASTY;  Surgeon: Gaynelle Arabian, MD;  Location: [facility];  Service: Orthopedics;  Laterality: Right;    Social History  Substance Use Topics  . Smoking status: Former Smoker    Types: Pipe    Quit date: 07/14/1978  . Smokeless tobacco: Never Used     Comment: quit smoking 1979  . Alcohol Use: Yes     Comment: OCCASIONAL    Family History  Problem Relation Age of Onset  . Heart disease Mother 107    Deceased  . Bladder Cancer Mother   . Prostate cancer Father     Deceased-in 73s  . Healthy Brother   . Skin cancer Brother     "basal cell cancer in the bloodstream"  . Asthma Sister     Deceased  . Healthy Son     #1  . Healthy Daughter     #2  . Obesity Son     #2    Allergies  Allergen Reactions  . Albamycin [Novobiocin] Other (See Comments)    Made me look like a strawberry   . Penicillins Rash and Other (See Comments)    Has patient had a PCN reaction causing immediate rash, facial/tongue/throat swelling, SOB or lightheadedness with hypotension:  no Has patient had a PCN reaction causing severe rash involving mucus membranes or skin necrosis: no Has patient had a PCN reaction that required hospitalization no Has patient had a PCN reaction occurring within the last 10 years: no - childhood reaction If all of the above answers are "NO", then may proceed with Cephalosporin use.     Medication list has been reviewed and updated.  Current Outpatient Prescriptions on File Prior to Visit  Medication Sig Dispense Refill  . aspirin EC 81 MG tablet Take 81 mg by mouth daily.    . celecoxib (CELEBREX) 200 MG capsule Take 1 capsule (200 mg total) by mouth 2 (two) times daily. 180 capsule 2  . desmopressin (DDAVP) 0.1 MG tablet Take 0.1 mg by mouth at bedtime.    . fenofibrate (TRICOR) 145 MG tablet Take 1 tablet (145 mg total)  by mouth every evening. 90 tablet 1  . FIBER PO Take 3 capsules by mouth 2 (two) times daily.    [name] Kitchen glucose blood (FREESTYLE LITE) test strip Use as instructed to test Blood Glucose twice daily Dx: E11.9  200 each 3  . Insulin Pen Needle (CAREFINE PEN NEEDLES) 31G X 8 MM MISC Use once daily with Victoza injections. 100 each 3  . Lancets (FREESTYLE) lancets 1 each by Other route 2 (two) times daily. Use as instructed 200 each 3  . Liraglutide 18 MG/3ML SOPN Inject 0.3 mLs (1.8 mg total) into the skin daily. 27 mL 1  . lisinopril (PRINIVIL,ZESTRIL) 10 MG tablet Take 1 tablet (10 mg total) by mouth every evening. 90 tablet 1  . Melatonin 5 MG TABS Take 5 mg by mouth at bedtime.    . metFORMIN (GLUCOPHAGE-XR) 500 MG 24 hr tablet Take 1 tablet (500 mg total) by mouth 2 (two) times daily with a meal. 180 tablet 1  . Multiple Vitamins-Minerals (MENS MULTIVITAMIN PLUS) TABS Take 1 tablet by mouth daily.     [name] Kitchen omeprazole (PRILOSEC) 20 MG capsule Take 1 capsule (20 mg total) by mouth daily. 90 capsule 1  . OVER THE COUNTER MEDICATION Take 2 tablets by mouth 2 (two) times daily. Calcium-Vitamin D-Magnesium    . Probiotic Product (PROBIOTIC PO) Take 1 capsule by mouth daily.    . simvastatin (ZOCOR) 10 MG tablet Take 1 tablet (10 mg total) by mouth at bedtime. 90 tablet 1  . tamsulosin (FLOMAX) 0.4 MG CAPS capsule Take 0.4 mg by mouth daily.     . vitamin C (ASCORBIC ACID) 500 MG tablet Take 500 mg by mouth daily.     No current facility-administered medications on file prior to visit.    Review of Systems:  As per HPI- otherwise negative.   Physical Examination: Filed Vitals:   05/18/16 1038  BP: 118/68  Pulse: 82  Temp: 98.4 F (36.9 C)   Filed Vitals:   05/18/16 1038  Height: 5\' 10"  (1.778 m)  Weight: 222 lb 12.8 oz (101.061 kg)   Body mass index is 31.97 kg/(m^2). Ideal Body Weight: Weight in (lb) to have BMI = 25: 173.9  GEN: WDWN, NAD, Non-toxic, A & O x 3, mild overweight, looks  well HEENT: Atraumatic, Normocephalic. Neck supple. No masses, No LAD. Ears and Nose: No external deformity. CV: RRR, No M/G/R. No JVD. No thrill. No extra heart sounds. PULM: CTA B, no wheezes, crackles, rhonchi. No retractions. No resp. distress. No accessory muscle use. ABD: S, NT, ND. No rebound. No HSM. EXTR: No c/c/e NEURO Normal gait.  PSYCH: Normally interactive. Conversant. Not depressed or anxious appearing.  Calm demeanor.    Results for orders placed or performed in visit on 05/18/16  CBC  Result Value Ref Range   WBC 6.7 4.0 - 10.5 K/uL   RBC 4.69 4.22 - 5.81 Mil/uL   Platelets 250.0 150.0 - 400.0 K/uL   Hemoglobin 13.3 13.0 - 17.0 g/dL   HCT 40.1 39.0 - 52.0 %   MCV 85.4 78.0 - 100.0 fl   MCHC 33.2 30.0 - 36.0 g/dL   RDW 13.9 11.5 - [id number] %  Basic metabolic panel  Result Value Ref Range   Sodium 141 135 - 145 mEq/L   Potassium 4.9 3.5 - 5.1 mEq/L   Chloride 108 96 - 112 mEq/L   CO2 28 19 - 32 mEq/L   Glucose, Bld 98 70 - 99 mg/dL   BUN 21 6 - 23 mg/dL   Creatinine, Ser 1.04 0.40 - 1.50 mg/dL   Calcium 10.0 8.4 - 10.5 mg/dL   GFR 73.39 >60.00 mL/min  Lipid panel  Result Value Ref Range   Cholesterol 113 0 -  200 mg/dL   Triglycerides 90.0 0.0 - 149.0 mg/dL   HDL 43.40 >39.00 mg/dL   VLDL 18.0 0.0 - 40.0 mg/dL   LDL Cholesterol 52 0 - 99 mg/dL   Total CHOL/HDL Ratio 3    NonHDL 69.88   Hemoglobin A1c  Result Value Ref Range   Hgb A1c MFr Bld 6.3 4.6 - 6.5 %   EKG:  SR,  New downgoing T in V2, no other change from previous EKG Assessment and Plan: Controlled type 2 diabetes mellitus without complication, without long-term current use of insulin ([facility]) - Plan: lisinopril (PRINIVIL,ZESTRIL) 10 MG tablet, metFORMIN (GLUCOPHAGE-XR) 500 MG 24 hr tablet, Hemoglobin A1c  Pre-operative cardiovascular examination - Plan: EKG 12-Lead, CBC, Basic metabolic panel  Dyslipidemia - Plan: fenofibrate (TRICOR) 145 MG tablet, Lipid panel  Well controlled DM.  Other labs are  normal today.  Minimal change on EKG but he has 4 mets of activity equiv.  Cleared for surgery, will sent letter to Dr. Ricki Rodriguez  Signed Lamar Blinks, MD

## 2016-05-18 NOTE — Patient Instructions (Signed)
I think you should be ok to have your knee surgery- assuming your labs are ok I will send a letter to Dr. Ricki Rodriguez Please go to lab and then you can head home.  Take care!

## 2016-05-18 NOTE — Progress Notes (Signed)
Pre visit review using our clinic review tool, if applicable. No additional management support is needed unless otherwise documented below in the visit note. 

## 2016-05-19 ENCOUNTER — Other Ambulatory Visit (HOSPITAL_COMMUNITY): Payer: Self-pay | Admitting: *Deleted

## 2016-05-19 NOTE — Progress Notes (Signed)
EKG 05-18-16 EPIC MEDICAL CLEARANCE NOTE DR Edilia Bo 05-18-16 EPIC

## 2016-05-19 NOTE — Patient Instructions (Addendum)
Matthew Nash  05/19/2016   Your procedure is scheduled on: 05-30-16  Report to [facility]  Entrance take [facility]  elevators to 3rd floor to  [place] at 730 AM.  Call this number if you have problems the morning of surgery [phone]   Remember: ONLY 1 PERSON MAY GO WITH YOU TO SHORT STAY TO GET  READY MORNING OF [place].  Do not eat food or drink liquids :After Midnight.     Take these medicines the morning of surgery with A SIP OF WATER: OMEPRAZOLE (PRILOSEC) DO NOT TAKE ANY DIABETIC MEDICATIONS DAY OF YOUR SURGERY                               You may not have any metal on your body including hair pins and              piercings  Do not wear jewelry, make-up, lotions, powders or perfumes, deodorant             Do not wear nail polish.  Do not shave  48 hours prior to surgery.              Men may shave face and neck.   Do not bring valuables to the hospital. [place].  Contacts, dentures or bridgework may not be worn into surgery.  Leave suitcase in the car. After surgery it may be brought to your room.                 Please read over the following fact sheets you were given: _____________________________________________________________________             [facility] - Preparing for Surgery Before surgery, you can play an important role.  Because skin is not sterile, your skin needs to be as free of germs as possible.  You can reduce the number of germs on your skin by washing with CHG (chlorahexidine gluconate) soap before surgery.  CHG is an antiseptic cleaner which kills germs and bonds with the skin to continue killing germs even after washing. Please DO NOT use if you have an allergy to CHG or antibacterial soaps.  If your skin becomes reddened/irritated stop using the CHG and inform your nurse when you arrive at Short Stay. Do not shave (including legs and underarms) for at least  48 hours prior to the first CHG shower.  You may shave your face/neck. Please follow these instructions carefully:  1.  Shower with CHG Soap the night before surgery and the  morning of Surgery.  2.  If you choose to wash your hair, wash your hair first as usual with your  normal  shampoo.  3.  After you shampoo, rinse your hair and body thoroughly to remove the  shampoo.                           4.  Use CHG as you would any other liquid soap.  You can apply chg directly  to the skin and wash                       Gently with a scrungie or  clean washcloth.  5.  Apply the CHG Soap to your body ONLY FROM THE NECK DOWN.   Do not use on face/ open                           Wound or open sores. Avoid contact with eyes, ears mouth and genitals (private parts).                       Wash face,  Genitals (private parts) with your normal soap.             6.  Wash thoroughly, paying special attention to the area where your surgery  will be performed.  7.  Thoroughly rinse your body with warm water from the neck down.  8.  DO NOT shower/wash with your normal soap after using and rinsing off  the CHG Soap.                9.  Pat yourself dry with a clean towel.            10.  Wear clean pajamas.            11.  Place clean sheets on your bed the night of your first shower and do not  sleep with pets. Day of Surgery : Do not apply any lotions/deodorants the morning of surgery.  Please wear clean clothes to the hospital/surgery center.  FAILURE TO FOLLOW THESE INSTRUCTIONS MAY RESULT IN THE CANCELLATION OF YOUR SURGERY PATIENT SIGNATURE_________________________________  NURSE SIGNATURE__________________________________  ________________________________________________________________________       WHAT IS A BLOOD TRANSFUSION? Blood Transfusion Information  A transfusion is the replacement of blood or some of its parts. Blood is made up of multiple cells which provide different functions.  Red  blood cells carry oxygen and are used for blood loss replacement.  White blood cells fight against infection.  Platelets control bleeding.  Plasma helps clot blood.  Other blood products are available for specialized needs, such as hemophilia or other clotting disorders. BEFORE THE TRANSFUSION  Who gives blood for transfusions?   Healthy volunteers who are fully evaluated to make sure their blood is safe. This is blood bank blood. Transfusion therapy is the safest it has ever been in the practice of medicine. Before blood is taken from a donor, a complete history is taken to make sure that person has no history of diseases nor engages in risky social behavior (examples are intravenous drug use or sexual activity with multiple partners). The donor's travel history is screened to minimize risk of transmitting infections, such as malaria. The donated blood is tested for signs of infectious diseases, such as HIV and hepatitis. The blood is then tested to be sure it is compatible with you in order to minimize the chance of a transfusion reaction. If you or a relative donates blood, this is often done in anticipation of surgery and is not appropriate for emergency situations. It takes many days to process the donated blood. RISKS AND COMPLICATIONS Although transfusion therapy is very safe and saves many lives, the main dangers of transfusion include:  1. Getting an infectious disease. 2. Developing a transfusion reaction. This is an allergic reaction to something in the blood you were given. Every precaution is taken to prevent this. The decision to have a blood transfusion has been considered carefully by your caregiver before blood is given. Blood is not given unless the benefits outweigh  the risks. AFTER THE TRANSFUSION  Right after receiving a blood transfusion, you will usually feel much better and more energetic. This is especially true if your red blood cells have gotten low (anemic). The  transfusion raises the level of the red blood cells which carry oxygen, and this usually causes an energy increase.  The nurse administering the transfusion will monitor you carefully for complications. HOME CARE INSTRUCTIONS  No special instructions are needed after a transfusion. You may find your energy is better. Speak with your caregiver about any limitations on activity for underlying diseases you may have. SEEK MEDICAL CARE IF:   Your condition is not improving after your transfusion.  You develop redness or irritation at the intravenous (IV) site. SEEK IMMEDIATE MEDICAL CARE IF:  Any of the following symptoms occur over the next 12 hours:  Shaking chills.  You have a temperature by mouth above 102 F (38.9 C), not controlled by medicine.  Chest, back, or muscle pain.  People around you feel you are not acting correctly or are confused.  Shortness of breath or difficulty breathing.  Dizziness and fainting.  You get a rash or develop hives.  You have a decrease in urine output.  Your urine turns a dark color or changes to pink, red, or brown. Any of the following symptoms occur over the next 10 days:  You have a temperature by mouth above 102 F (38.9 C), not controlled by medicine.  Shortness of breath.  Weakness after normal activity.  The white part of the eye turns yellow (jaundice).  You have a decrease in the amount of urine or are urinating less often.  Your urine turns a dark color or changes to pink, red, or brown. Document Released: 11/25/2000 Document Revised: 02/20/2012 Document Reviewed: 07/14/2008 ExitCare Patient Information 2014 [place].  _______________________________________________________________________  Incentive Spirometer  An incentive spirometer is a tool that can help keep your lungs clear and active. This tool measures how well you are filling your lungs with each breath. Taking long deep breaths may help reverse or  decrease the chance of developing breathing (pulmonary) problems (especially infection) following:  A long period of time when you are unable to move or be active. BEFORE THE PROCEDURE   If the spirometer includes an indicator to show your best effort, your nurse or respiratory therapist will set it to a desired goal.  If possible, sit up straight or lean slightly forward. Try not to slouch.  Hold the incentive spirometer in an upright position. INSTRUCTIONS FOR USE  3. Sit on the edge of your bed if possible, or sit up as far as you can in bed or on a chair. 4. Hold the incentive spirometer in an upright position. 5. Breathe out normally. 6. Place the mouthpiece in your mouth and seal your lips tightly around it. 7. Breathe in slowly and as deeply as possible, raising the piston or the ball toward the top of the column. 8. Hold your breath for 3-5 seconds or for as long as possible. Allow the piston or ball to fall to the bottom of the column. 9. Remove the mouthpiece from your mouth and breathe out normally. 10. Rest for a few seconds and repeat Steps 1 through 7 at least 10 times every 1-2 hours when you are awake. Take your time and take a few normal breaths between deep breaths. 11. The spirometer may include an indicator to show your best effort. Use the indicator as a goal to work  toward during each repetition. 12. After each set of 10 deep breaths, practice coughing to be sure your lungs are clear. If you have an incision (the cut made at the time of surgery), support your incision when coughing by placing a pillow or rolled up towels firmly against it. Once you are able to get out of bed, walk around indoors and cough well. You may stop using the incentive spirometer when instructed by your caregiver.  RISKS AND COMPLICATIONS  Take your time so you do not get dizzy or light-headed.  If you are in pain, you may need to take or ask for pain medication before doing incentive  spirometry. It is harder to take a deep breath if you are having pain. AFTER USE  Rest and breathe slowly and easily.  It can be helpful to keep track of a log of your progress. Your caregiver can provide you with a simple table to help with this. If you are using the spirometer at home, follow these instructions: [place] IF:   You are having difficultly using the spirometer.  You have trouble using the spirometer as often as instructed.  Your pain medication is not giving enough relief while using the spirometer.  You develop fever of 100.5 F (38.1 C) or higher. SEEK IMMEDIATE MEDICAL CARE IF:   You cough up bloody sputum that had not been present before.  You develop fever of 102 F (38.9 C) or greater.  You develop worsening pain at or near the incision site. MAKE SURE YOU:   Understand these instructions.  Will watch your condition.  Will get help right away if you are not doing well or get worse. Document Released: 04/10/2007 Document Revised: 02/20/2012 Document Reviewed: 06/11/2007 [facility] Patient Information 2014 [place], [place].   ________________________________________________________________________

## 2016-05-23 ENCOUNTER — Encounter (HOSPITAL_COMMUNITY)
Admission: RE | Admit: 2016-05-23 | Discharge: 2016-05-23 | Disposition: A | Discharge status: Home / Self Care | Payer: Medicare Other | Source: Ambulatory Visit | Attending: Orthopedic Surgery | Admitting: Orthopedic Surgery

## 2016-05-23 ENCOUNTER — Encounter (HOSPITAL_COMMUNITY): Payer: Self-pay

## 2016-05-23 DIAGNOSIS — M1712 Unilateral primary osteoarthritis, left knee: Secondary | ICD-10-CM | POA: Insufficient documentation

## 2016-05-23 DIAGNOSIS — Z01812 Encounter for preprocedural laboratory examination: Secondary | ICD-10-CM | POA: Insufficient documentation

## 2016-05-23 HISTORY — DX: Peripheral vascular disease, unspecified: I73.9

## 2016-05-23 HISTORY — DX: Chronic kidney disease, unspecified: N18.9

## 2016-05-23 LAB — COMPREHENSIVE METABOLIC PANEL
ALT: 20 U/L (ref 17–63)
ANION GAP: 8 (ref 5–15)
AST: 14 U/L — AB (ref 15–41)
Albumin: 4.5 g/dL (ref 3.5–5.0)
Alkaline Phosphatase: 43 U/L (ref 38–126)
BILIRUBIN TOTAL: 0.6 mg/dL (ref 0.3–1.2)
BUN: 20 mg/dL (ref 6–20)
CO2: 24 mmol/L (ref 22–32)
Calcium: 9.8 mg/dL (ref 8.9–10.3)
Chloride: 107 mmol/L (ref 101–111)
Creatinine, Ser: 1.1 mg/dL (ref 0.61–1.24)
Glucose, Bld: 99 mg/dL (ref 65–99)
POTASSIUM: 4.8 mmol/L (ref 3.5–5.1)
Sodium: 139 mmol/L (ref 135–145)
TOTAL PROTEIN: 7.4 g/dL (ref 6.5–8.1)

## 2016-05-23 LAB — URINALYSIS, ROUTINE W REFLEX MICROSCOPIC
Bilirubin Urine: NEGATIVE
GLUCOSE, UA: NEGATIVE mg/dL
Hgb urine dipstick: NEGATIVE
KETONES UR: NEGATIVE mg/dL
LEUKOCYTES UA: NEGATIVE
Nitrite: NEGATIVE
PH: 5.5 (ref 5.0–8.0)
Protein, ur: NEGATIVE mg/dL
Specific Gravity, Urine: 1.016 (ref 1.005–1.030)

## 2016-05-23 LAB — SURGICAL PCR SCREEN
MRSA, PCR: NEGATIVE
Staphylococcus aureus: NEGATIVE

## 2016-05-23 LAB — PROTIME-INR
INR: 1.02 (ref 0.00–1.49)
PROTHROMBIN TIME: 13.6 s (ref 11.6–15.2)

## 2016-05-23 LAB — APTT: aPTT: 27 seconds (ref 24–37)

## 2016-05-23 NOTE — Progress Notes (Signed)
05-18-16 - HgbA1C (6.3) - in chart 05-18-16 - CBC - in chart 05-18-16 - BMP - in chart

## 2016-05-24 NOTE — Progress Notes (Signed)
Clearance note per chart per Dr Edilia Bo 05/21/2016

## 2016-05-25 ENCOUNTER — Encounter: Payer: Self-pay | Admitting: Family Medicine

## 2016-05-26 DIAGNOSIS — N390 Urinary tract infection, site not specified: Secondary | ICD-10-CM | POA: Diagnosis not present

## 2016-05-26 DIAGNOSIS — N320 Bladder-neck obstruction: Secondary | ICD-10-CM | POA: Diagnosis not present

## 2016-05-29 ENCOUNTER — Ambulatory Visit: Payer: Self-pay | Admitting: Orthopedic Surgery

## 2016-05-29 NOTE — H&P (Signed)
Matthew Nash DOB: July 06, 1936 Married / Language: English / Race: White Male Date of Admission:  05/30/2016 CC:  Left Knee Pain History of Present Illness The patient is a 80 year old male who comes in today for a preoperative History and Physical. The patient is scheduled for a left total knee arthroplasty to be performed by Dr. Dione Plover. [name], MD at [facility] on 05-30-2016. The patient is a 80 year old male who presents today for follow up of their knee. The patient is being followed for their left osteoarthritis. He is now months out from last injection in Euflexxa series. Symptoms reported include: pain. The patient feels that they are doing poorly and the patient states he is 10% better following the injections. The following medication has been used for pain control: none. The patient has not gotten any relief of their symptoms with viscosupplementation. He states the right knee is doing great at this time. Left knee has become increasing problematic. Not only does he have pain, but his neuropathy is also very problematic. He feels like the dysfunction he has had from his knee has made the neuropathy more problematic also. At this point, the most predictable means of improving pain and function is total knee arthroplasty. The procedure, risks, potential complications and rehab course are discussed in detail and the patient elects to proceed. The goals of this procedure are decreased pain and increased function. There is a high liklihood that both of these goals will be achieved. He has got advanced arthritis in that left knee with bone-on-bone medial and patellofemoral. Unfortunately, the visco supplements did not provide a lot of benefit. He would like to go ahead and get this fixed. I told him the most predictable means of getting it better is total knee arthroplasty. I am concerned about the neuropathy and told him that there is some evidence that if we use a tourniquet during  surgery it can make the neuropathy worse. Thus, we would not use a tourniquet for his procedure. We can do this without a tourniquet. They have been treated conservatively in the past for the above stated problem and despite conservative measures, they continue to have progressive pain and severe functional limitations and dysfunction. They have failed non-operative management including home exercise, medications, and injections. It is felt that they would benefit from undergoing total joint replacement. Risks and benefits of the procedure have been discussed with the patient and they elect to proceed with surgery. There are no active contraindications to surgery such as ongoing infection or rapidly progressive neurological disease.  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 Penicillins  Rash. Adolescent Years  Family History  Other medical problems  Sister died due to sever asthmatic attack Hypertension  First Degree Relatives. mother and father Severe allergy  First Degree Relatives. brother Heart  disease in male family member before age 59  Heart Disease  mother Cancer  mother  Social  History Drug/Alcohol Rehab (Currently)  no Advance Directives  Living Will Number of flights of stairs before winded  2-3 Marital status  married Current work status  retired Illicit drug use  no Exercise  Exercises rarely; does individual sport and other Previously in rehab  no Pain Contract  no Living situation  live with spouse Tobacco / smoke exposure  yes outdoors only Children  4 Tobacco use  Former smoker. former smoker; smoke(d) 3 or more pack(s) per day Alcohol use  Occasional alcohol use. current drinker; drinks beer, wine and hard liquor; only occasionally per week [place] with Wife  Medication History MetFORMIN HCl ER (500MG  Tablet ER 24HR, Oral two times daily) Active. (bid 1000mg ) Omeprazole (20MG  Capsule DR, Oral) Active. (qd) Fenofibrate (145MG  Tablet, Oral) Active. Lisinopril (10MG  Tablet, Oral) Active. (qd) CeleBREX (200MG  Capsule, 1 Oral daily) Active. Simvastatin (10MG  Tablet, Oral) Active. (qd) Victoza (18MG /3ML Solution, Subcutaneous) Active. (1.8 mg qd) Aspirin Low Dose (81MG  Tablet DR, Oral) Active. (qd) Vitamin C (500MG  Tablet, 1 Oral) Active. (qd) Multiple Vitamin (1 Oral) Active. (qd) Fiber (Oral) Active. Calcium-Magnesium (1 Oral) Active. (qd) Probiotic (Oral) Active. Tamsulosin HCl (0.4MG  Capsule, Oral) Active. Desmopressin Acetate (0.1MG  Tablet, Oral at bedtime) Active. Melatonin (5MG  Tablet, Oral) Active.  Past Surgical History  Gallbladder Surgery  Date: 68. laporoscopic Arthroscopy of Knee  bilateral: right 2015 Prostatectomy; Transurethral  Date: 2006. Arthroscopy of Shoulder  right Tonsillectomy  Date: 1942. Rotator Cuff Repair  Left - 2010, Right - 2012 Cholecystectomy  Rotator Cuff Repair - Left  Vasectomy  Back Surgery L2-L3  Date: 2015. Neck Disc Surgery  Total Knee Replacement - Right    Review of Systems General Not Present- Chills, Fatigue, Fever, Memory Loss,  Night Sweats, Weight Gain and Weight Loss. Skin Not Present- Eczema, Hives, Itching, Lesions and Rash. HEENT Not Present- Dentures, Double Vision, Headache, Hearing Loss, Tinnitus and Visual Loss. Respiratory Not Present- Allergies, Chronic Cough, Coughing up blood, Shortness of breath at rest and Shortness of breath with exertion. Cardiovascular Not Present- Chest Pain, Difficulty Breathing Lying Down, Murmur, Palpitations, Racing/skipping heartbeats and Swelling. Gastrointestinal Not Present- Abdominal Pain, Bloody Stool, Constipation, Diarrhea, Difficulty Swallowing, Heartburn, Jaundice, Loss of appetitie, Nausea and Vomiting. Male Genitourinary Present- Incontinence and Urinating at Night. Not Present- Blood in Urine, Discharge, Flank Pain, Painful Urination, Urgency, Urinary frequency, Urinary Retention and Weak urinary stream. Musculoskeletal Present- Joint Pain. Not Present- Back Pain, Joint Swelling, Morning Stiffness, Muscle Pain, Muscle Weakness and Spasms. Neurological Not Present- Blackout spells, Difficulty with balance, Dizziness, Paralysis, Tremor and Weakness. Psychiatric Not Present- Insomnia.  Vitals Weight: 220 lb Height: 71in Weight was reported by patient. Height was reported by patient. Body Surface Area: 2.2 m Body Mass Index: 30.68 kg/m  Pulse: 72 (Regular)  BP: 118/62 (Sitting, Left Arm, Standard)  Physical Exam General Mental Status -Alert, cooperative and good historian. General Appearance-pleasant, Not in acute distress. Orientation-Oriented X3. Build & Nutrition-Well nourished and Well developed.  Head and Neck Head-normocephalic, atraumatic . Neck Global Assessment - supple, no bruit auscultated on the right, no bruit auscultated on the left. Note: lower denture plate   Eye Vision-Wears corrective lenses. Pupil - Bilateral-Regular and Round. Motion - Bilateral-EOMI.  Chest and Lung Exam Auscultation Breath sounds -  clear at anterior chest wall and clear at posterior chest wall. Adventitious sounds - No Adventitious sounds.  Cardiovascular Auscultation Rhythm -  Regular rate and rhythm. Heart Sounds - S1 WNL and S2 WNL. Murmurs & Other Heart Sounds - Auscultation of the heart reveals - No Murmurs.  Abdomen Palpation/Percussion Tenderness - Abdomen is non-tender to palpation. Rigidity (guarding) - Abdomen is soft. Auscultation Auscultation of the abdomen reveals - Bowel sounds normal.  Male Genitourinary Note: Not done, not pertinent to present illness   Musculoskeletal Note: On exam, he is in no distress. His left knee shows no effusion. His range is about 10 to 125. There is marked crepitus on range of motion with tenderness medial greater than lateral, no instability noted. Pulses and motor intact. Sensation is diminished.  Assessment & Plan Primary osteoarthritis of left knee (M17.12)  Note:Surgical Plans: Left Total Knee Replacement  Disposition: Home  PCP: Dr. Silvestre Mesi Urology: Dr. Gaynelle Arabian  IV TXA  Anesthesia Issues: None  Signed electronically by Ok Edwards, III PA-C

## 2016-05-30 ENCOUNTER — Inpatient Hospital Stay (HOSPITAL_COMMUNITY): Payer: Medicare Other | Admitting: Anesthesiology

## 2016-05-30 ENCOUNTER — Encounter (HOSPITAL_COMMUNITY): Payer: Self-pay | Admitting: *Deleted

## 2016-05-30 ENCOUNTER — Inpatient Hospital Stay (HOSPITAL_COMMUNITY)
Admission: RE | Admit: 2016-05-30 | Discharge: 2016-06-01 | DRG: 470 | Disposition: A | Discharge status: Home Health | Payer: Medicare Other | Source: Ambulatory Visit | Attending: Orthopedic Surgery | Admitting: Orthopedic Surgery

## 2016-05-30 ENCOUNTER — Encounter (HOSPITAL_COMMUNITY)
Admission: RE | Disposition: A | Discharge status: Home Health | Payer: Self-pay | Source: Ambulatory Visit | Attending: Orthopedic Surgery

## 2016-05-30 DIAGNOSIS — K219 Gastro-esophageal reflux disease without esophagitis: Secondary | ICD-10-CM | POA: Diagnosis present

## 2016-05-30 DIAGNOSIS — E1140 Type 2 diabetes mellitus with diabetic neuropathy, unspecified: Secondary | ICD-10-CM | POA: Diagnosis present

## 2016-05-30 DIAGNOSIS — Z9079 Acquired absence of other genital organ(s): Secondary | ICD-10-CM | POA: Diagnosis not present

## 2016-05-30 DIAGNOSIS — E785 Hyperlipidemia, unspecified: Secondary | ICD-10-CM | POA: Diagnosis present

## 2016-05-30 DIAGNOSIS — B1910 Unspecified viral hepatitis B without hepatic coma: Secondary | ICD-10-CM | POA: Diagnosis present

## 2016-05-30 DIAGNOSIS — M1712 Unilateral primary osteoarthritis, left knee: Principal | ICD-10-CM | POA: Diagnosis present

## 2016-05-30 DIAGNOSIS — N400 Enlarged prostate without lower urinary tract symptoms: Secondary | ICD-10-CM | POA: Diagnosis present

## 2016-05-30 DIAGNOSIS — Z85820 Personal history of malignant melanoma of skin: Secondary | ICD-10-CM | POA: Diagnosis not present

## 2016-05-30 DIAGNOSIS — E119 Type 2 diabetes mellitus without complications: Secondary | ICD-10-CM | POA: Diagnosis not present

## 2016-05-30 DIAGNOSIS — M25562 Pain in left knee: Secondary | ICD-10-CM | POA: Diagnosis not present

## 2016-05-30 DIAGNOSIS — Z9049 Acquired absence of other specified parts of digestive tract: Secondary | ICD-10-CM

## 2016-05-30 DIAGNOSIS — Z87891 Personal history of nicotine dependence: Secondary | ICD-10-CM

## 2016-05-30 DIAGNOSIS — Z8249 Family history of ischemic heart disease and other diseases of the circulatory system: Secondary | ICD-10-CM | POA: Diagnosis not present

## 2016-05-30 DIAGNOSIS — M179 Osteoarthritis of knee, unspecified: Secondary | ICD-10-CM | POA: Diagnosis present

## 2016-05-30 DIAGNOSIS — M1711 Unilateral primary osteoarthritis, right knee: Secondary | ICD-10-CM

## 2016-05-30 DIAGNOSIS — I129 Hypertensive chronic kidney disease with stage 1 through stage 4 chronic kidney disease, or unspecified chronic kidney disease: Secondary | ICD-10-CM | POA: Diagnosis present

## 2016-05-30 DIAGNOSIS — Z825 Family history of asthma and other chronic lower respiratory diseases: Secondary | ICD-10-CM | POA: Diagnosis not present

## 2016-05-30 DIAGNOSIS — E1122 Type 2 diabetes mellitus with diabetic chronic kidney disease: Secondary | ICD-10-CM | POA: Diagnosis present

## 2016-05-30 DIAGNOSIS — Z96651 Presence of right artificial knee joint: Secondary | ICD-10-CM | POA: Diagnosis present

## 2016-05-30 DIAGNOSIS — Z86010 Personal history of colonic polyps: Secondary | ICD-10-CM | POA: Diagnosis not present

## 2016-05-30 DIAGNOSIS — R32 Unspecified urinary incontinence: Secondary | ICD-10-CM | POA: Diagnosis present

## 2016-05-30 DIAGNOSIS — N189 Chronic kidney disease, unspecified: Secondary | ICD-10-CM | POA: Diagnosis present

## 2016-05-30 DIAGNOSIS — M1710 Unilateral primary osteoarthritis, unspecified knee: Secondary | ICD-10-CM | POA: Diagnosis present

## 2016-05-30 DIAGNOSIS — I10 Essential (primary) hypertension: Secondary | ICD-10-CM | POA: Diagnosis not present

## 2016-05-30 DIAGNOSIS — Z809 Family history of malignant neoplasm, unspecified: Secondary | ICD-10-CM | POA: Diagnosis not present

## 2016-05-30 DIAGNOSIS — E7800 Pure hypercholesterolemia, unspecified: Secondary | ICD-10-CM | POA: Diagnosis present

## 2016-05-30 HISTORY — PX: TOTAL KNEE ARTHROPLASTY: SHX125

## 2016-05-30 LAB — GLUCOSE, CAPILLARY
GLUCOSE-CAPILLARY: 115 mg/dL — AB (ref 65–99)
GLUCOSE-CAPILLARY: 103 mg/dL — AB (ref 65–99)
GLUCOSE-CAPILLARY: 134 mg/dL — AB (ref 65–99)
GLUCOSE-CAPILLARY: 169 mg/dL — AB (ref 65–99)
GLUCOSE-CAPILLARY: 136 mg/dL — AB (ref 65–99)
Glucose-Capillary: 122 mg/dL — ABNORMAL HIGH (ref 65–99)

## 2016-05-30 LAB — TYPE AND SCREEN
ABO/RH(D): O NEG
ANTIBODY SCREEN: NEGATIVE

## 2016-05-30 SURGERY — ARTHROPLASTY, KNEE, TOTAL
Anesthesia: Spinal | Site: Knee | Laterality: Left

## 2016-05-30 MED ORDER — METOCLOPRAMIDE HCL 5 MG/ML IJ SOLN: 5.0000 mg | Freq: Three times a day (TID) | INTRAMUSCULAR | Status: DC | PRN

## 2016-05-30 MED ORDER — DESMOPRESSIN ACETATE 0.1 MG PO TABS
0.1000 mg | ORAL_TABLET | Freq: Every day | ORAL | Status: DC
  Administered 2016-05-31: 0.1 mg via ORAL
  Filled 2016-05-30 (×3): qty 1

## 2016-05-30 MED ORDER — HYDROMORPHONE HCL 1 MG/ML IJ SOLN
0.2500 mg | INTRAMUSCULAR | Status: DC | PRN
  Administered 2016-05-30 (×2): 0.25 mg via INTRAVENOUS

## 2016-05-30 MED ORDER — DOCUSATE SODIUM 100 MG PO CAPS
100.0000 mg | ORAL_CAPSULE | Freq: Two times a day (BID) | ORAL | Status: DC
  Administered 2016-05-30 – 2016-06-01 (×5): 100 mg via ORAL
  Filled 2016-05-30 (×5): qty 1

## 2016-05-30 MED ORDER — ACETAMINOPHEN 10 MG/ML IV SOLN
1000.0000 mg | Freq: Once | INTRAVENOUS | Status: AC
  Administered 2016-05-30: 1000 mg via INTRAVENOUS
  Filled 2016-05-30: qty 100

## 2016-05-30 MED ORDER — BUPIVACAINE HCL (PF) 0.25 % IJ SOLN
INTRAMUSCULAR | Status: AC
  Filled 2016-05-30: qty 30

## 2016-05-30 MED ORDER — OXYCODONE HCL 5 MG PO TABS: 5.0000 mg | ORAL_TABLET | ORAL | Status: DC | PRN

## 2016-05-30 MED ORDER — MENTHOL 3 MG MT LOZG
1.0000 | LOZENGE | OROMUCOSAL | Status: DC | PRN
Start: 2016-05-30 — End: 2016-06-01

## 2016-05-30 MED ORDER — METOCLOPRAMIDE HCL 5 MG/ML IJ SOLN
INTRAMUSCULAR | Status: AC
  Filled 2016-05-30: qty 2

## 2016-05-30 MED ORDER — RIVAROXABAN 10 MG PO TABS
10.0000 mg | ORAL_TABLET | Freq: Every day | ORAL | Status: DC
  Administered 2016-05-31 – 2016-06-01 (×2): 10 mg via ORAL
  Filled 2016-05-30 (×2): qty 1

## 2016-05-30 MED ORDER — PHENYLEPHRINE HCL 10 MG/ML IJ SOLN
INTRAMUSCULAR | Status: AC
  Filled 2016-05-30: qty 1

## 2016-05-30 MED ORDER — BUPIVACAINE LIPOSOME 1.3 % IJ SUSP
20.0000 mL | Freq: Once | INTRAMUSCULAR | Status: DC
  Filled 2016-05-30: qty 20

## 2016-05-30 MED ORDER — LIRAGLUTIDE 18 MG/3ML ~~LOC~~ SOPN
1.8000 mg | PEN_INJECTOR | Freq: Every day | SUBCUTANEOUS | Status: DC
  Administered 2016-05-31 – 2016-06-01 (×2): 1.8 mg via SUBCUTANEOUS

## 2016-05-30 MED ORDER — ONDANSETRON HCL 4 MG/2ML IJ SOLN: 4.0000 mg | Freq: Four times a day (QID) | INTRAMUSCULAR | Status: DC | PRN

## 2016-05-30 MED ORDER — METOCLOPRAMIDE HCL 5 MG PO TABS: 5.0000 mg | ORAL_TABLET | Freq: Three times a day (TID) | ORAL | Status: DC | PRN

## 2016-05-30 MED ORDER — SODIUM CHLORIDE 0.9 % IV SOLN
1000.0000 mg | Freq: Once | INTRAVENOUS | Status: AC
  Administered 2016-05-30: 1000 mg via INTRAVENOUS
  Filled 2016-05-30: qty 10

## 2016-05-30 MED ORDER — PROMETHAZINE HCL 25 MG/ML IJ SOLN: 6.2500 mg | INTRAMUSCULAR | Status: DC | PRN

## 2016-05-30 MED ORDER — LACTATED RINGERS IV SOLN
INTRAVENOUS | Status: DC
  Administered 2016-05-30: 09:00:00 via INTRAVENOUS

## 2016-05-30 MED ORDER — FENTANYL CITRATE (PF) 250 MCG/5ML IJ SOLN
INTRAMUSCULAR | Status: AC
  Filled 2016-05-30: qty 5

## 2016-05-30 MED ORDER — MIDAZOLAM HCL 2 MG/2ML IJ SOLN
INTRAMUSCULAR | Status: AC
  Filled 2016-05-30: qty 2

## 2016-05-30 MED ORDER — BUPIVACAINE LIPOSOME 1.3 % IJ SUSP
INTRAMUSCULAR | Status: DC | PRN
  Administered 2016-05-30: 20 mL

## 2016-05-30 MED ORDER — CHLORHEXIDINE GLUCONATE 4 % EX LIQD: 60.0000 mL | Freq: Once | CUTANEOUS | Status: DC

## 2016-05-30 MED ORDER — POLYETHYLENE GLYCOL 3350 17 G PO PACK: 17.0000 g | PACK | Freq: Every day | ORAL | Status: DC | PRN

## 2016-05-30 MED ORDER — TRAMADOL HCL 50 MG PO TABS: 50.0000 mg | ORAL_TABLET | Freq: Four times a day (QID) | ORAL | Status: DC | PRN

## 2016-05-30 MED ORDER — TRANEXAMIC ACID 1000 MG/10ML IV SOLN
1000.0000 mg | INTRAVENOUS | Status: AC
  Administered 2016-05-30: 1000 mg via INTRAVENOUS
  Filled 2016-05-30: qty 10

## 2016-05-30 MED ORDER — SODIUM CHLORIDE 0.9 % IV SOLN
INTRAVENOUS | Status: DC
  Administered 2016-05-30: 100 mL/h via INTRAVENOUS
  Administered 2016-05-30: 23:00:00 via INTRAVENOUS

## 2016-05-30 MED ORDER — BISACODYL 10 MG RE SUPP: 10.0000 mg | Freq: Every day | RECTAL | Status: DC | PRN

## 2016-05-30 MED ORDER — HYDROMORPHONE HCL 1 MG/ML IJ SOLN
INTRAMUSCULAR | Status: AC
  Administered 2016-05-30: 0.25 mg via INTRAVENOUS
  Filled 2016-05-30: qty 1

## 2016-05-30 MED ORDER — ACETAMINOPHEN 650 MG RE SUPP
650.0000 mg | Freq: Four times a day (QID) | RECTAL | Status: DC | PRN
Start: 2016-05-31 — End: 2016-06-01

## 2016-05-30 MED ORDER — ONDANSETRON HCL 4 MG/2ML IJ SOLN
INTRAMUSCULAR | Status: DC | PRN
  Administered 2016-05-30: 4 mg via INTRAVENOUS

## 2016-05-30 MED ORDER — DEXAMETHASONE SODIUM PHOSPHATE 10 MG/ML IJ SOLN: 10.0000 mg | Freq: Once | INTRAMUSCULAR | Status: DC

## 2016-05-30 MED ORDER — HYDROMORPHONE HCL 1 MG/ML IJ SOLN: 0.2500 mg | INTRAMUSCULAR | Status: DC | PRN

## 2016-05-30 MED ORDER — PHENYLEPHRINE 40 MCG/ML (10ML) SYRINGE FOR IV PUSH (FOR BLOOD PRESSURE SUPPORT)
PREFILLED_SYRINGE | INTRAVENOUS | Status: AC
  Filled 2016-05-30: qty 10

## 2016-05-30 MED ORDER — FENTANYL CITRATE (PF) 100 MCG/2ML IJ SOLN
INTRAMUSCULAR | Status: DC | PRN
  Administered 2016-05-30: 100 ug via INTRAVENOUS

## 2016-05-30 MED ORDER — METHOCARBAMOL 1000 MG/10ML IJ SOLN
500.0000 mg | Freq: Four times a day (QID) | INTRAVENOUS | Status: DC | PRN
  Administered 2016-05-30: 500 mg via INTRAVENOUS
  Filled 2016-05-30: qty 5
  Filled 2016-05-30: qty 550

## 2016-05-30 MED ORDER — METHOCARBAMOL 500 MG PO TABS
500.0000 mg | ORAL_TABLET | Freq: Four times a day (QID) | ORAL | Status: DC | PRN
  Administered 2016-05-31: 500 mg via ORAL
  Filled 2016-05-30: qty 1

## 2016-05-30 MED ORDER — MIDAZOLAM HCL 5 MG/5ML IJ SOLN
INTRAMUSCULAR | Status: DC | PRN
  Administered 2016-05-30: 2 mg via INTRAVENOUS

## 2016-05-30 MED ORDER — ONDANSETRON HCL 4 MG PO TABS: 4.0000 mg | ORAL_TABLET | Freq: Four times a day (QID) | ORAL | Status: DC | PRN

## 2016-05-30 MED ORDER — BUPIVACAINE IN DEXTROSE 0.75-8.25 % IT SOLN
INTRATHECAL | Status: DC | PRN
  Administered 2016-05-30: 15 mg via INTRATHECAL

## 2016-05-30 MED ORDER — BUPIVACAINE HCL 0.25 % IJ SOLN
INTRAMUSCULAR | Status: DC | PRN
  Administered 2016-05-30: 20 mL

## 2016-05-30 MED ORDER — SIMVASTATIN 20 MG PO TABS
10.0000 mg | ORAL_TABLET | Freq: Every day | ORAL | Status: DC
  Administered 2016-05-30 – 2016-05-31 (×2): 10 mg via ORAL
  Filled 2016-05-30 (×2): qty 1

## 2016-05-30 MED ORDER — PANTOPRAZOLE SODIUM 40 MG PO TBEC
40.0000 mg | DELAYED_RELEASE_TABLET | Freq: Every day | ORAL | Status: DC
  Administered 2016-05-31 – 2016-06-01 (×2): 40 mg via ORAL
  Filled 2016-05-30 (×2): qty 1

## 2016-05-30 MED ORDER — LIDOCAINE HCL (CARDIAC) 20 MG/ML IV SOLN
INTRAVENOUS | Status: AC
  Filled 2016-05-30: qty 5

## 2016-05-30 MED ORDER — FENOFIBRATE 160 MG PO TABS
160.0000 mg | ORAL_TABLET | Freq: Every evening | ORAL | Status: DC
  Administered 2016-05-30 – 2016-05-31 (×2): 160 mg via ORAL
  Filled 2016-05-30 (×2): qty 1

## 2016-05-30 MED ORDER — INSULIN ASPART 100 UNIT/ML ~~LOC~~ SOLN: 0.0000 [IU] | Freq: Three times a day (TID) | SUBCUTANEOUS | Status: DC

## 2016-05-30 MED ORDER — ACETAMINOPHEN 10 MG/ML IV SOLN
INTRAVENOUS | Status: AC
  Filled 2016-05-30: qty 100

## 2016-05-30 MED ORDER — SODIUM CHLORIDE 0.9 % IJ SOLN
INTRAMUSCULAR | Status: DC | PRN
  Administered 2016-05-30: 30 mL

## 2016-05-30 MED ORDER — DIPHENHYDRAMINE HCL 12.5 MG/5ML PO ELIX: 12.5000 mg | ORAL_SOLUTION | ORAL | Status: DC | PRN

## 2016-05-30 MED ORDER — SODIUM CHLORIDE 0.9 % IJ SOLN
INTRAMUSCULAR | Status: AC
  Filled 2016-05-30: qty 50

## 2016-05-30 MED ORDER — PHENYLEPHRINE 40 MCG/ML (10ML) SYRINGE FOR IV PUSH (FOR BLOOD PRESSURE SUPPORT)
PREFILLED_SYRINGE | INTRAVENOUS | Status: DC | PRN
  Administered 2016-05-30 (×4): 80 ug via INTRAVENOUS

## 2016-05-30 MED ORDER — PHENOL 1.4 % MT LIQD: 1.0000 | OROMUCOSAL | Status: DC | PRN

## 2016-05-30 MED ORDER — CEFAZOLIN SODIUM-DEXTROSE 2-4 GM/100ML-% IV SOLN
INTRAVENOUS | Status: AC
  Filled 2016-05-30: qty 100

## 2016-05-30 MED ORDER — MORPHINE SULFATE (PF) 2 MG/ML IV SOLN: 1.0000 mg | INTRAVENOUS | Status: DC | PRN

## 2016-05-30 MED ORDER — RIVAROXABAN 10 MG PO TABS: 10.0000 mg | ORAL_TABLET | Freq: Every day | ORAL | Status: DC

## 2016-05-30 MED ORDER — PROPOFOL 500 MG/50ML IV EMUL
INTRAVENOUS | Status: DC | PRN
  Administered 2016-05-30: 75 ug/kg/min via INTRAVENOUS

## 2016-05-30 MED ORDER — ONDANSETRON HCL 4 MG/2ML IJ SOLN
INTRAMUSCULAR | Status: AC
Start: 2016-05-30 — End: 2016-05-30
  Filled 2016-05-30: qty 2

## 2016-05-30 MED ORDER — METFORMIN HCL ER 500 MG PO TB24
500.0000 mg | ORAL_TABLET | Freq: Two times a day (BID) | ORAL | Status: DC
  Administered 2016-06-01: 500 mg via ORAL
  Filled 2016-05-30: qty 1

## 2016-05-30 MED ORDER — CEFAZOLIN SODIUM-DEXTROSE 2-4 GM/100ML-% IV SOLN
2.0000 g | Freq: Four times a day (QID) | INTRAVENOUS | Status: AC
  Administered 2016-05-30 (×2): 2 g via INTRAVENOUS
  Filled 2016-05-30 (×2): qty 100

## 2016-05-30 MED ORDER — DEXAMETHASONE SODIUM PHOSPHATE 10 MG/ML IJ SOLN
INTRAMUSCULAR | Status: AC
  Filled 2016-05-30: qty 1

## 2016-05-30 MED ORDER — PROPOFOL 10 MG/ML IV BOLUS
INTRAVENOUS | Status: AC
  Filled 2016-05-30: qty 40

## 2016-05-30 MED ORDER — SODIUM CHLORIDE 0.9 % IR SOLN
Status: DC | PRN
  Administered 2016-05-30: 1000 mL

## 2016-05-30 MED ORDER — TAMSULOSIN HCL 0.4 MG PO CAPS
0.4000 mg | ORAL_CAPSULE | Freq: Every day | ORAL | Status: DC
  Administered 2016-05-31 – 2016-06-01 (×2): 0.4 mg via ORAL
  Filled 2016-05-30 (×2): qty 1

## 2016-05-30 MED ORDER — DEXAMETHASONE SODIUM PHOSPHATE 10 MG/ML IJ SOLN
10.0000 mg | Freq: Once | INTRAMUSCULAR | Status: AC
  Administered 2016-05-30: 10 mg via INTRAVENOUS

## 2016-05-30 MED ORDER — LACTATED RINGERS IV SOLN
INTRAVENOUS | Status: DC
  Administered 2016-05-30 (×2): via INTRAVENOUS

## 2016-05-30 MED ORDER — METHOCARBAMOL 500 MG PO TABS: 500.0000 mg | ORAL_TABLET | Freq: Four times a day (QID) | ORAL | Status: DC | PRN

## 2016-05-30 MED ORDER — FLEET ENEMA 7-19 GM/118ML RE ENEM: 1.0000 | ENEMA | Freq: Once | RECTAL | Status: DC | PRN

## 2016-05-30 MED ORDER — ACETAMINOPHEN 325 MG PO TABS: 650.0000 mg | ORAL_TABLET | Freq: Four times a day (QID) | ORAL | Status: DC | PRN

## 2016-05-30 MED ORDER — TRAMADOL HCL 50 MG PO TABS
50.0000 mg | ORAL_TABLET | Freq: Four times a day (QID) | ORAL | Status: DC | PRN
  Administered 2016-05-30 – 2016-06-01 (×5): 50 mg via ORAL
  Filled 2016-05-30 (×5): qty 1

## 2016-05-30 MED ORDER — CEFAZOLIN SODIUM-DEXTROSE 2-4 GM/100ML-% IV SOLN
2.0000 g | INTRAVENOUS | Status: AC
  Administered 2016-05-30: 2 g via INTRAVENOUS
  Filled 2016-05-30: qty 100

## 2016-05-30 MED ORDER — FENTANYL CITRATE (PF) 100 MCG/2ML IJ SOLN
INTRAMUSCULAR | Status: AC
  Filled 2016-05-30: qty 2

## 2016-05-30 MED ORDER — ACETAMINOPHEN 500 MG PO TABS
1000.0000 mg | ORAL_TABLET | Freq: Four times a day (QID) | ORAL | Status: AC
  Administered 2016-05-30 – 2016-05-31 (×4): 1000 mg via ORAL
  Filled 2016-05-30 (×4): qty 2

## 2016-05-30 SURGICAL SUPPLY — 51 items
BAG DECANTER FOR FLEXI CONT (MISCELLANEOUS) ×3 IMPLANT
BAG SPEC THK2 15X12 ZIP CLS (MISCELLANEOUS) ×1
BAG ZIPLOCK 12X15 (MISCELLANEOUS) ×3 IMPLANT
BANDAGE ACE 6X5 VEL STRL LF (GAUZE/BANDAGES/DRESSINGS) ×3 IMPLANT
BLADE SAG 18X100X1.27 (BLADE) ×3 IMPLANT
BLADE SAW SGTL 11.0X1.19X90.0M (BLADE) ×3 IMPLANT
BOWL SMART MIX CTS (DISPOSABLE) ×3 IMPLANT
CAP KNEE TOTAL 3 SIGMA ×2 IMPLANT
CEMENT HV SMART SET (Cement) ×6 IMPLANT
CLOSURE WOUND 1/2 X4 (GAUZE/BANDAGES/DRESSINGS) ×2
CLOTH BEACON ORANGE TIMEOUT ST (SAFETY) ×3 IMPLANT
CUFF TOURN SGL QUICK 34 (TOURNIQUET CUFF) ×3
CUFF TRNQT CYL 34X4X40X1 (TOURNIQUET CUFF) ×1 IMPLANT
DECANTER SPIKE VIAL GLASS SM (MISCELLANEOUS) ×3 IMPLANT
DRAPE U-SHAPE 47X51 STRL (DRAPES) ×3 IMPLANT
DRSG ADAPTIC 3X8 NADH LF (GAUZE/BANDAGES/DRESSINGS) ×3 IMPLANT
DRSG PAD ABDOMINAL 8X10 ST (GAUZE/BANDAGES/DRESSINGS) ×1 IMPLANT
DURAPREP 26ML APPLICATOR (WOUND CARE) ×3 IMPLANT
ELECT REM PT RETURN 9FT ADLT (ELECTROSURGICAL) ×3
ELECTRODE REM PT RTRN 9FT ADLT (ELECTROSURGICAL) ×1 IMPLANT
EVACUATOR 1/8 PVC DRAIN (DRAIN) ×3 IMPLANT
GAUZE SPONGE 4X4 12PLY STRL (GAUZE/BANDAGES/DRESSINGS) ×3 IMPLANT
GLOVE BIO SURGEON STRL SZ7.5 (GLOVE) ×2 IMPLANT
GLOVE BIO SURGEON STRL SZ8 (GLOVE) ×5 IMPLANT
GLOVE BIOGEL PI IND STRL 6.5 (GLOVE) IMPLANT
GLOVE BIOGEL PI IND STRL 8 (GLOVE) ×1 IMPLANT
GLOVE BIOGEL PI INDICATOR 6.5 (GLOVE)
GLOVE BIOGEL PI INDICATOR 8 (GLOVE) ×4
GLOVE SURG SS PI 6.5 STRL IVOR (GLOVE) ×2 IMPLANT
GOWN STRL REUS W/TWL LRG LVL3 (GOWN DISPOSABLE) ×3 IMPLANT
GOWN STRL REUS W/TWL XL LVL3 (GOWN DISPOSABLE) IMPLANT
HANDPIECE INTERPULSE COAX TIP (DISPOSABLE) ×3
IMMOBILIZER KNEE 20 (SOFTGOODS) ×3
IMMOBILIZER KNEE 20 THIGH 36 (SOFTGOODS) ×1 IMPLANT
MANIFOLD NEPTUNE II (INSTRUMENTS) ×3 IMPLANT
NS IRRIG 1000ML POUR BTL (IV SOLUTION) ×3 IMPLANT
PACK TOTAL KNEE CUSTOM (KITS) ×3 IMPLANT
PAD ABD 8X10 STRL (GAUZE/BANDAGES/DRESSINGS) ×2 IMPLANT
PADDING CAST COTTON 6X4 STRL (CAST SUPPLIES) ×9 IMPLANT
POSITIONER SURGICAL ARM (MISCELLANEOUS) ×3 IMPLANT
SET HNDPC FAN SPRY TIP SCT (DISPOSABLE) ×1 IMPLANT
STRIP CLOSURE SKIN 1/2X4 (GAUZE/BANDAGES/DRESSINGS) ×4 IMPLANT
SUT MNCRL AB 4-0 PS2 18 (SUTURE) ×3 IMPLANT
SUT VIC AB 2-0 CT1 27 (SUTURE) ×9
SUT VIC AB 2-0 CT1 TAPERPNT 27 (SUTURE) ×3 IMPLANT
SUT VLOC 180 0 24IN GS25 (SUTURE) ×3 IMPLANT
SYR 50ML LL SCALE MARK (SYRINGE) ×3 IMPLANT
TRAY FOLEY W/METER SILVER 16FR (SET/KITS/TRAYS/PACK) ×3 IMPLANT
WATER STERILE IRR 1500ML POUR (IV SOLUTION) ×1 IMPLANT
WRAP KNEE MAXI GEL POST OP (GAUZE/BANDAGES/DRESSINGS) ×3 IMPLANT
YANKAUER SUCT BULB TIP 10FT TU (MISCELLANEOUS) ×3 IMPLANT

## 2016-05-30 NOTE — Discharge Instructions (Addendum)
° °Dr. Frank [name] °Total Joint Specialist °[facility] °[address] °, [place] [address] °[phone] ° °TOTAL KNEE REPLACEMENT POSTOPERATIVE DIRECTIONS ° °Knee Rehabilitation, Guidelines Following Surgery  °Results after knee surgery are often greatly improved when you follow the exercise, range of motion and muscle strengthening exercises prescribed by your doctor. Safety measures are also important to protect the knee from further injury. Any time any of these exercises cause you to have increased pain or swelling in your knee joint, decrease the amount until you are comfortable again and slowly increase them. If you have problems or questions, call your caregiver or physical therapist for advice.  ° °HOME CARE INSTRUCTIONS  °Remove items at home which could result in a fall. This includes throw rugs or furniture in walking pathways.  °· ICE to the affected knee every three hours for 30 minutes at a time and then as needed for pain and swelling.  Continue to use ice on the knee for pain and swelling from surgery. You may notice swelling that will progress down to the foot and ankle.  This is normal after surgery.  Elevate the leg when you are not up walking on it.   °· Continue to use the breathing machine which will help keep your temperature down.  It is common for your temperature to cycle up and down following surgery, especially at night when you are not up moving around and exerting yourself.  The breathing machine keeps your lungs expanded and your temperature down. °· Do not place pillow under knee, focus on keeping the knee straight while resting ° °DIET °You may resume your previous home diet once your are discharged from the hospital. ° °DRESSING / WOUND CARE / SHOWERING °You may start showering once you are discharged home but do not submerge the incision under water. Just pat the incision dry and apply a dry gauze dressing on daily. °Change the surgical dressing  daily and reapply a dry dressing each time. ° °ACTIVITY °Walk with your walker as instructed. °Use walker as long as suggested by your caregivers. °Avoid periods of inactivity such as sitting longer than an hour when not asleep. This helps prevent blood clots.  °You may resume a sexual relationship in one month or when given the OK by your doctor.  °You may return to work once you are cleared by your doctor.  °Do not drive a car for 6 weeks or until released by you surgeon.  °Do not drive while taking narcotics. ° °WEIGHT BEARING °Weight bearing as tolerated with assist device (walker, cane, etc) as directed, use it as long as suggested by your surgeon or therapist, typically at least 4-6 weeks. ° °POSTOPERATIVE CONSTIPATION PROTOCOL °Constipation - defined medically as fewer than three stools per week and severe constipation as less than one stool per week. ° °One of the most common issues patients have following surgery is constipation.  Even if you have a regular bowel pattern at home, your normal regimen is likely to be disrupted due to multiple reasons following surgery.  Combination of anesthesia, postoperative narcotics, change in appetite and fluid intake all can affect your bowels.  In order to avoid complications following surgery, here are some recommendations in order to help you during your recovery period. ° °Colace (docusate) - Pick up an over-the-counter form of Colace or another stool softener and take twice a day as long as you are requiring postoperative pain medications.  Take with a full glass of water   daily.  If you experience loose stools or diarrhea, hold the colace until you stool forms back up.  If your symptoms do not get better within 1 week or if they get worse, check with your doctor. ° °Dulcolax (bisacodyl) - Pick up over-the-counter and take as directed by the product packaging as needed to assist with the movement of your bowels.  Take with a full glass of water.  Use this product as  needed if not relieved by Colace only.  ° °MiraLax (polyethylene glycol) - Pick up over-the-counter to have on hand.  MiraLax is a solution that will increase the amount of water in your bowels to assist with bowel movements.  Take as directed and can mix with a glass of water, juice, soda, coffee, or tea.  Take if you go more than two days without a movement. °Do not use MiraLax more than once per day. Call your doctor if you are still constipated or irregular after using this medication for 7 days in a row. ° °If you continue to have problems with postoperative constipation, please contact the office for further assistance and recommendations.  If you experience "the worst abdominal pain ever" or develop nausea or vomiting, please contact the office immediatly for further recommendations for treatment. ° °ITCHING ° If you experience itching with your medications, try taking only a single pain pill, or even half a pain pill at a time.  You can also use Benadryl over the counter for itching or also to help with sleep.  ° °TED HOSE STOCKINGS °Wear the elastic stockings on both legs for three weeks following surgery during the day but you may remove then at night for sleeping. ° °MEDICATIONS °See your medication summary on the “After Visit Summary” that the nursing staff will review with you prior to discharge.  You may have some home medications which will be placed on hold until you complete the course of blood thinner medication.  It is important for you to complete the blood thinner medication as prescribed by your surgeon.  Continue your approved medications as instructed at time of discharge. ° °PRECAUTIONS °If you experience chest pain or shortness of breath - call 911 immediately for transfer to the hospital emergency department.  °If you develop a fever greater that 101 F, purulent drainage from wound, increased redness or drainage from wound, foul odor from the wound/dressing, or calf pain - CONTACT YOUR  SURGEON.   °                                                °FOLLOW-UP APPOINTMENTS °Make sure you keep all of your appointments after your operation with your surgeon and caregivers. You should call the office at the above phone number and make an appointment for approximately two weeks after the date of your surgery or on the date instructed by your surgeon outlined in the "After Visit Summary". ° ° °RANGE OF MOTION AND STRENGTHENING EXERCISES  °Rehabilitation of the knee is important following a knee injury or an operation. After just a few days of immobilization, the muscles of the thigh which control the knee become weakened and shrink (atrophy). Knee exercises are designed to build up the tone and strength of the thigh muscles and to improve knee motion. Often times heat used for twenty to thirty minutes before working out will loosen   up your tissues and help with improving the range of motion but do not use heat for the first two weeks following surgery. These exercises can be done on a training (exercise) mat, on the floor, on a table or on a bed. Use what ever works the best and is most comfortable for you Knee exercises include:  Leg Lifts - While your knee is still immobilized in a splint or cast, you can do straight leg raises. Lift the leg to 60 degrees, hold for 3 sec, and slowly lower the leg. Repeat 10-20 times 2-3 times daily. Perform this exercise against resistance later as your knee gets better.  Quad and Hamstring Sets - Tighten up the muscle on the front of the thigh (Quad) and hold for 5-10 sec. Repeat this 10-20 times hourly. Hamstring sets are done by pushing the foot backward against an object and holding for 5-10 sec. Repeat as with quad sets.   Leg Slides: Lying on your back, slowly slide your foot toward your buttocks, bending your knee up off the floor (only go as far as is comfortable). Then slowly slide your foot back down until your leg is flat on the floor again.  Angel Wings:  Lying on your back spread your legs to the side as far apart as you can without causing discomfort.  A rehabilitation program following serious knee injuries can speed recovery and prevent re-injury in the future due to weakened muscles. Contact your doctor or a physical therapist for more information on knee rehabilitation.   IF YOU ARE TRANSFERRED TO A SKILLED REHAB FACILITY If the patient is transferred to a skilled rehab facility following release from the hospital, a list of the current medications will be sent to the facility for the patient to continue.  When discharged from the skilled rehab facility, please have the facility set up the patient's [place] prior to being released. Also, the skilled facility will be responsible for providing the patient with their medications at time of release from the facility to include their pain medication, the muscle relaxants, and their blood thinner medication. If the patient is still at the rehab facility at time of the two week follow up appointment, the skilled rehab facility will also need to assist the patient in arranging follow up appointment in our office and any transportation needs.  MAKE SURE YOU:  Understand these instructions.  Get help right away if you are not doing well or get worse.    Pick up stool softner and laxative for home use following surgery while on pain medications. Do not submerge incision under water. Please use good hand washing techniques while changing dressing each day. May shower starting three days after surgery. Please use a clean towel to pat the incision dry following showers. Continue to use ice for pain and swelling after surgery. Do not use any lotions or creams on the incision until instructed by your surgeon.  Information on my medicine - XARELTO (Rivaroxaban)  This medication education was reviewed with me or my healthcare representative as part of my discharge preparation.  The  pharmacist that spoke with me during my hospital stay was:  Biagio Borg, [facility]  Why was Xarelto prescribed for you? Xarelto was prescribed for you to reduce the risk of blood clots forming after orthopedic surgery. The medical term for these abnormal blood clots is venous thromboembolism (VTE).  What do you need to know about xarelto ? Take your Xarelto ONCE DAILY at  the same time every day. You may take it either with or without food.  If you have difficulty swallowing the tablet whole, you may crush it and mix in applesauce just prior to taking your dose.  Take Xarelto exactly as prescribed by your doctor and DO NOT stop taking Xarelto without talking to the doctor who prescribed the medication.  Stopping without other VTE prevention medication to take the place of Xarelto may increase your risk of developing a clot.  After discharge, you should have regular check-up appointments with your healthcare provider that is prescribing your Xarelto.    What do you do if you miss a dose? If you miss a dose, take it as soon as you remember on the same day then continue your regularly scheduled once daily regimen the next day. Do not take two doses of Xarelto on the same day.   Important Safety Information A possible side effect of Xarelto is bleeding. You should call your healthcare provider right away if you experience any of the following: ? Bleeding from an injury or your nose that does not stop. ? Unusual colored urine (red or dark brown) or unusual colored stools (red or black). ? Unusual bruising for unknown reasons. ? A serious fall or if you hit your head (even if there is no bleeding).  Some medicines may interact with Xarelto and might increase your risk of bleeding while on Xarelto. To help avoid this, consult your healthcare provider or pharmacist prior to using any new prescription or non-prescription medications, including herbals, vitamins, non-steroidal  anti-inflammatory drugs (NSAIDs) and supplements.  This website has more information on Xarelto: [email or web address]

## 2016-05-30 NOTE — Op Note (Signed)
Pre-operative diagnosis- Osteoarthritis  Left knee(s)  Post-operative diagnosis- Osteoarthritis Left knee(s)  Procedure-  Left  Total Knee Arthroplasty  Surgeon- Dione Plover. Ike Maragh, MD  Assistant- Molli Barrows, PA-C   Anesthesia-  Spinal  EBL-* No blood loss amount entered *   Drains Hemovac  Tourniquet time-   No tourniquet used secondary to neuropathy  Complications- None  Condition-PACU - hemodynamically stable.   Brief Clinical Note   Matthew Nash is a 81 y.o. year old male with end stage OA of his left knee with progressively worsening pain and dysfunction. He has constant pain, with activity and at rest and significant functional deficits with difficulties even with ADLs. He has had extensive non-op management including analgesics, injections of cortisone and viscosupplements, and home exercise program, but remains in significant pain with significant dysfunction. Radiographs show bone on bone arthritis medial. He presents now for left Total Knee Arthroplasty.    Procedure in detail---   The patient is brought into the operating room and positioned supine on the operating table. After successful administration of  Spinal,   a tourniquet is placed high on the  Left thigh(s) and the lower extremity is prepped and draped in the usual sterile fashion. Time out is performed by the operating team       A midline incision is made with a ten blade through the subcutaneous tissue to the level of the extensor mechanism. A fresh blade is used to make a medial parapatellar arthrotomy. Soft tissue over the proximal medial tibia is subperiosteally elevated to the joint line with a knife and into the semimembranosus bursa with a Cobb elevator. Soft tissue over the proximal lateral tibia is elevated with attention being paid to avoiding the patellar tendon on the tibial tubercle. The patella is everted, knee flexed 90 degrees and the ACL and PCL are removed. Findings are exposed bone all 3  compartments.        The drill is used to create a starting hole in the distal femur and the canal is thoroughly irrigated with sterile saline to remove the fatty contents. The 5 degree Left  valgus alignment guide is placed into the femoral canal and the distal femoral cutting block is pinned to remove 10 mm off the distal femur. Resection is made with an oscillating saw.      The tibia is subluxed forward and the menisci are removed. The extramedullary alignment guide is placed referencing proximally at the medial aspect of the tibial tubercle and distally along the second metatarsal axis and tibial crest. The block is pinned to remove 85mm off the more deficient medial  side. Resection is made with an oscillating saw. Size 5is the most appropriate size for the tibia and the proximal tibia is prepared with the modular drill and keel punch for that size.      The femoral sizing guide is placed and size 5 is most appropriate. Rotation is marked off the epicondylar axis and confirmed by creating a rectangular flexion gap at 90 degrees. The size 5 cutting block is pinned in this rotation and the anterior, posterior and chamfer cuts are made with the oscillating saw. The intercondylar block is then placed and that cut is made.      Trial size 5 tibial component, trial size 5 posterior stabilized femur and a 10  mm posterior stabilized rotating platform insert trial is placed. Full extension is achieved with excellent varus/valgus and anterior/posterior balance throughout full range of motion. The patella is  everted and thickness measured to be 27  mm. Free hand resection is taken to 15 mm, a 41 template is placed, lug holes are drilled, trial patella is placed, and it tracks normally. Osteophytes are removed off the posterior femur with the trial in place. All trials are removed and the cut bone surfaces prepared with pulsatile lavage. Cement is mixed and once ready for implantation, the size 5 tibial implant, size   5 posterior stabilized femoral component, and the size 41 patella are cemented in place and the patella is held with the clamp. The trial insert is placed and the knee held in full extension. The Exparel (20 ml mixed with 30 ml saline) and .25% Bupivicaine, are injected into the extensor mechanism, posterior capsule, medial and lateral gutters and subcutaneous tissues.  All extruded cement is removed and once the cement is hard the permanent 10 mm posterior stabilized rotating platform insert is placed into the tibial tray.      The wound is copiously irrigated with saline solution and the extensor mechanism closed over a hemovac drain with #1 V-loc suture. Flexion against gravity is 140 degrees and the patella tracks normally. Subcutaneous tissue is closed with 2.0 vicryl and subcuticular with running 4.0 Monocryl. The incision is cleaned and dried and steri-strips and a bulky sterile dressing are applied. The limb is placed into a knee immobilizer and the patient is awakened and transported to recovery in stable condition.      Please note that a surgical assistant was a medical necessity for this procedure in order to perform it in a safe and expeditious manner. Surgical assistant was necessary to retract the ligaments and vital neurovascular structures to prevent injury to them and also necessary for proper positioning of the limb to allow for anatomic placement of the prosthesis.   Dione Plover Anzley Dibbern, MD    05/30/2016, 11:06 AM

## 2016-05-30 NOTE — Anesthesia Preprocedure Evaluation (Signed)
Anesthesia Evaluation  Patient identified by MRN, date of birth, ID band Patient awake    Reviewed: Allergy & Precautions, NPO status , Patient's Chart, lab work & pertinent test results  Airway Mallampati: II   Neck ROM: full    Dental  (+) Teeth Intact   Pulmonary former smoker,    breath sounds clear to auscultation       Cardiovascular hypertension,  Rhythm:regular Rate:Normal     Neuro/Psych    GI/Hepatic hiatal hernia, GERD  ,(+) Hepatitis -  Endo/Other  diabetes, Type 2  Renal/GU      Musculoskeletal  (+) Arthritis ,   Abdominal   Peds  Hematology   Anesthesia Other Findings   Reproductive/Obstetrics                             Anesthesia Physical Anesthesia Plan  ASA: III  Anesthesia Plan: Spinal   Post-op Pain Management:    Induction: Intravenous  Airway Management Planned: Natural Airway and Simple Face Mask  Additional Equipment:   Intra-op Plan:   Post-operative Plan: Extubation in OR  Informed Consent: I have reviewed the patients History and Physical, chart, labs and discussed the procedure including the risks, benefits and alternatives for the proposed anesthesia with the patient or authorized representative who has indicated his/her understanding and acceptance.   Dental advisory given  Plan Discussed with: CRNA and Surgeon  Anesthesia Plan Comments:         Anesthesia Quick Evaluation

## 2016-05-30 NOTE — Anesthesia Procedure Notes (Signed)
Spinal Patient location during procedure: OR Start time: 05/30/2016 10:00 AM End time: 05/30/2016 10:07 AM Staffing Resident/CRNA: Enrique Weiss Performed by: resident/CRNA  Preanesthetic Checklist Completed: patient identified, site marked, surgical consent, pre-op evaluation, timeout performed, IV checked, risks and benefits discussed and monitors and equipment checked Spinal Block Patient position: sitting Prep: Betadine Patient monitoring: heart rate, cardiac monitor, continuous pulse ox and blood pressure Approach: midline Location: L3-4 Injection technique: single-shot Needle Needle type: Spinocan  Needle gauge: 22 G Needle length: 9 cm Needle insertion depth: 7 cm Assessment Sensory level: T6 Additional Notes Expiration OK.  -heme, -para, VSS.

## 2016-05-30 NOTE — Transfer of Care (Signed)
Immediate Anesthesia Transfer of Care Note  Patient: Matthew Nash  Procedure(s) Performed: Procedure(s): LEFT TOTAL KNEE ARTHROPLASTY (Left)  Patient Location: PACU  Anesthesia Type:Spinal  Level of Consciousness:  sedated, patient cooperative and responds to stimulation  Airway & Oxygen Therapy:Patient Spontanous Breathing and Patient connected to face mask oxgen  Post-op Assessment:  Report given to PACU RN and Post -op Vital signs reviewed and stable  Post vital signs:  Reviewed and stable  Last Vitals:  Filed Vitals:   05/30/16 0734  BP: 124/69  Pulse: 64  Temp: 36.7 C  Resp: 18    Complications: No apparent anesthesia complications

## 2016-05-30 NOTE — H&P (View-Only) (Signed)
Matthew Nash DOB: 27-Nov-1936 Married / Language: English / Race: White Male Date of Admission:  05/30/2016 CC:  Left Knee Pain History of Present Illness The patient is a 80 year old male who comes in today for a preoperative History and Physical. The patient is scheduled for a left total knee arthroplasty to be performed by Dr. Dione Plover. [name], MD at [facility] on 05-30-2016. The patient is a 80 year old male who presents today for follow up of their knee. The patient is being followed for their left osteoarthritis. He is now months out from last injection in Euflexxa series. Symptoms reported include: pain. The patient feels that they are doing poorly and the patient states he is 10% better following the injections. The following medication has been used for pain control: none. The patient has not gotten any relief of their symptoms with viscosupplementation. He states the right knee is doing great at this time. Left knee has become increasing problematic. Not only does he have pain, but his neuropathy is also very problematic. He feels like the dysfunction he has had from his knee has made the neuropathy more problematic also. At this point, the most predictable means of improving pain and function is total knee arthroplasty. The procedure, risks, potential complications and rehab course are discussed in detail and the patient elects to proceed. The goals of this procedure are decreased pain and increased function. There is a high liklihood that both of these goals will be achieved. He has got advanced arthritis in that left knee with bone-on-bone medial and patellofemoral. Unfortunately, the visco supplements did not provide a lot of benefit. He would like to go ahead and get this fixed. I told him the most predictable means of getting it better is total knee arthroplasty. I am concerned about the neuropathy and told him that there is some evidence that if we use a tourniquet during  surgery it can make the neuropathy worse. Thus, we would not use a tourniquet for his procedure. We can do this without a tourniquet. They have been treated conservatively in the past for the above stated problem and despite conservative measures, they continue to have progressive pain and severe functional limitations and dysfunction. They have failed non-operative management including home exercise, medications, and injections. It is felt that they would benefit from undergoing total joint replacement. Risks and benefits of the procedure have been discussed with the patient and they elect to proceed with surgery. There are no active contraindications to surgery such as ongoing infection or rapidly progressive neurological disease.  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 Penicillins  Rash. Adolescent Years  Family History  Other medical problems  Sister died due to sever asthmatic attack Hypertension  First Degree Relatives. mother and father Severe allergy  First Degree Relatives. brother Heart  disease in male family member before age 54  Heart Disease  mother Cancer  mother  Social  History Drug/Alcohol Rehab (Currently)  no Advance Directives  Living Will Number of flights of stairs before winded  2-3 Marital status  married Current work status  retired Illicit drug use  no Exercise  Exercises rarely; does individual sport and other Previously in rehab  no Pain Contract  no Living situation  live with spouse Tobacco / smoke exposure  yes outdoors only Children  4 Tobacco use  Former smoker. former smoker; smoke(d) 3 or more pack(s) per day Alcohol use  Occasional alcohol use. current drinker; drinks beer, wine and hard liquor; only occasionally per week [place] with Wife  Medication History MetFORMIN HCl ER (500MG  Tablet ER 24HR, Oral two times daily) Active. (bid 1000mg ) Omeprazole (20MG  Capsule DR, Oral) Active. (qd) Fenofibrate (145MG  Tablet, Oral) Active. Lisinopril (10MG  Tablet, Oral) Active. (qd) CeleBREX (200MG  Capsule, 1 Oral daily) Active. Simvastatin (10MG  Tablet, Oral) Active. (qd) Victoza (18MG /3ML Solution, Subcutaneous) Active. (1.8 mg qd) Aspirin Low Dose (81MG  Tablet DR, Oral) Active. (qd) Vitamin C (500MG  Tablet, 1 Oral) Active. (qd) Multiple Vitamin (1 Oral) Active. (qd) Fiber (Oral) Active. Calcium-Magnesium (1 Oral) Active. (qd) Probiotic (Oral) Active. Tamsulosin HCl (0.4MG  Capsule, Oral) Active. Desmopressin Acetate (0.1MG  Tablet, Oral at bedtime) Active. Melatonin (5MG  Tablet, Oral) Active.  Past Surgical History  Gallbladder Surgery  Date: 55. laporoscopic Arthroscopy of Knee  bilateral: right 2015 Prostatectomy; Transurethral  Date: 2006. Arthroscopy of Shoulder  right Tonsillectomy  Date: 1942. Rotator Cuff Repair  Left - 2010, Right - 2012 Cholecystectomy  Rotator Cuff Repair - Left  Vasectomy  Back Surgery L2-L3  Date: 2015. Neck Disc Surgery  Total Knee Replacement - Right    Review of Systems General Not Present- Chills, Fatigue, Fever, Memory Loss,  Night Sweats, Weight Gain and Weight Loss. Skin Not Present- Eczema, Hives, Itching, Lesions and Rash. HEENT Not Present- Dentures, Double Vision, Headache, Hearing Loss, Tinnitus and Visual Loss. Respiratory Not Present- Allergies, Chronic Cough, Coughing up blood, Shortness of breath at rest and Shortness of breath with exertion. Cardiovascular Not Present- Chest Pain, Difficulty Breathing Lying Down, Murmur, Palpitations, Racing/skipping heartbeats and Swelling. Gastrointestinal Not Present- Abdominal Pain, Bloody Stool, Constipation, Diarrhea, Difficulty Swallowing, Heartburn, Jaundice, Loss of appetitie, Nausea and Vomiting. Male Genitourinary Present- Incontinence and Urinating at Night. Not Present- Blood in Urine, Discharge, Flank Pain, Painful Urination, Urgency, Urinary frequency, Urinary Retention and Weak urinary stream. Musculoskeletal Present- Joint Pain. Not Present- Back Pain, Joint Swelling, Morning Stiffness, Muscle Pain, Muscle Weakness and Spasms. Neurological Not Present- Blackout spells, Difficulty with balance, Dizziness, Paralysis, Tremor and Weakness. Psychiatric Not Present- Insomnia.  Vitals Weight: 220 lb Height: 71in Weight was reported by patient. Height was reported by patient. Body Surface Area: 2.2 m Body Mass Index: 30.68 kg/m  Pulse: 72 (Regular)  BP: 118/62 (Sitting, Left Arm, Standard)  Physical Exam General Mental Status -Alert, cooperative and good historian. General Appearance-pleasant, Not in acute distress. Orientation-Oriented X3. Build & Nutrition-Well nourished and Well developed.  Head and Neck Head-normocephalic, atraumatic . Neck Global Assessment - supple, no bruit auscultated on the right, no bruit auscultated on the left. Note: lower denture plate   Eye Vision-Wears corrective lenses. Pupil - Bilateral-Regular and Round. Motion - Bilateral-EOMI.  Chest and Lung Exam Auscultation Breath sounds -  clear at anterior chest wall and clear at posterior chest wall. Adventitious sounds - No Adventitious sounds.  Cardiovascular Auscultation Rhythm -  Regular rate and rhythm. Heart Sounds - S1 WNL and S2 WNL. Murmurs & Other Heart Sounds - Auscultation of the heart reveals - No Murmurs.  Abdomen Palpation/Percussion Tenderness - Abdomen is non-tender to palpation. Rigidity (guarding) - Abdomen is soft. Auscultation Auscultation of the abdomen reveals - Bowel sounds normal.  Male Genitourinary Note: Not done, not pertinent to present illness   Musculoskeletal Note: On exam, he is in no distress. His left knee shows no effusion. His range is about 10 to 125. There is marked crepitus on range of motion with tenderness medial greater than lateral, no instability noted. Pulses and motor intact. Sensation is diminished.  Assessment & Plan Primary osteoarthritis of left knee (M17.12)  Note:Surgical Plans: Left Total Knee Replacement  Disposition: Home  PCP: Dr. Silvestre Mesi Urology: Dr. Gaynelle Arabian  IV TXA  Anesthesia Issues: None  Signed electronically by Ok Edwards, III PA-C

## 2016-05-30 NOTE — Interval H&P Note (Signed)
History and Physical Interval Note:  05/30/2016 9:12 AM  Matthew Nash  has presented today for surgery, with the diagnosis of LEFT KNEE OA  The various methods of treatment have been discussed with the patient and family. After consideration of risks, benefits and other options for treatment, the patient has consented to  Procedure(s): LEFT TOTAL KNEE ARTHROPLASTY (Left) as a surgical intervention .  The patient's history has been reviewed, patient examined, no change in status, stable for surgery.  I have reviewed the patient's chart and labs.  Questions were answered to the patient's satisfaction.     Gearlean Alf

## 2016-05-30 NOTE — Anesthesia Postprocedure Evaluation (Signed)
Anesthesia Post Note  Patient: Matthew Nash  Procedure(s) Performed: Procedure(s) (LRB): LEFT TOTAL KNEE ARTHROPLASTY (Left)  Patient location during evaluation: PACU Anesthesia Type: Spinal Level of consciousness: oriented and awake and alert Pain management: pain level controlled Vital Signs Assessment: post-procedure vital signs reviewed and stable Respiratory status: spontaneous breathing, respiratory function stable and patient connected to nasal cannula oxygen Cardiovascular status: blood pressure returned to baseline and stable Postop Assessment: no headache, no backache and spinal receding Anesthetic complications: no    Last Vitals:  Filed Vitals:   05/30/16 1230 05/30/16 1241  BP:  132/85  Pulse: 68 70  Temp: 36.4 C 36.5 C  Resp: 11 12    Last Pain:  Filed Vitals:   05/30/16 1321  PainSc: 3                  Briele Lagasse,JAMES TERRILL

## 2016-05-31 LAB — CBC
HCT: 34.6 % — ABNORMAL LOW (ref 39.0–52.0)
Hemoglobin: 11.6 g/dL — ABNORMAL LOW (ref 13.0–17.0)
MCH: 29.1 pg (ref 26.0–34.0)
MCHC: 33.5 g/dL (ref 30.0–36.0)
MCV: 86.7 fL (ref 78.0–100.0)
PLATELETS: 243 10*3/uL (ref 150–400)
RBC: 3.99 MIL/uL — AB (ref 4.22–5.81)
RDW: 13.5 % (ref 11.5–15.5)
WBC: 10.6 10*3/uL — AB (ref 4.0–10.5)

## 2016-05-31 LAB — BASIC METABOLIC PANEL
ANION GAP: 7 (ref 5–15)
BUN: 12 mg/dL (ref 6–20)
CO2: 24 mmol/L (ref 22–32)
Calcium: 8.9 mg/dL (ref 8.9–10.3)
Chloride: 107 mmol/L (ref 101–111)
Creatinine, Ser: 0.88 mg/dL (ref 0.61–1.24)
Glucose, Bld: 131 mg/dL — ABNORMAL HIGH (ref 65–99)
POTASSIUM: 4.5 mmol/L (ref 3.5–5.1)
SODIUM: 138 mmol/L (ref 135–145)

## 2016-05-31 LAB — GLUCOSE, CAPILLARY
GLUCOSE-CAPILLARY: 109 mg/dL — AB (ref 65–99)
GLUCOSE-CAPILLARY: 130 mg/dL — AB (ref 65–99)
Glucose-Capillary: 94 mg/dL (ref 65–99)

## 2016-05-31 NOTE — Evaluation (Signed)
Physical Therapy Evaluation Patient Details Name: Matthew Nash MRN: [id number] DOB: 11/03/1936 Today's Date: 05/31/2016   History of Present Illness  80 yo male s/p L TKA 05/30/16. Hx of R TKA.   Clinical Impression  On eval, pt required Min assist for mobility-walked ~75 feet with RW. Pain 5/10 with activity. Will follow and progress activity as tolerated.     Follow Up Recommendations Outpatient PT;Supervision/Assistance - 24 hour    Equipment Recommendations  None recommended by PT    Recommendations for Other Services OT consult     Precautions / Restrictions Precautions Precautions: Knee;Fall Required Braces or Orthoses: Knee Immobilizer - Left Knee Immobilizer - Left: Discontinue once straight leg raise with < 10 degree lag Other Brace/Splint: bil AFOs Restrictions Weight Bearing Restrictions: No LLE Weight Bearing: Weight bearing as tolerated      Mobility  Bed Mobility Overal bed mobility: Needs Assistance Bed Mobility: Supine to Sit;Sit to Supine     Supine to sit: Min assist;HOB elevated     General bed mobility comments: Assist for L LE.   Transfers Overall transfer level: Needs assistance Equipment used: Rolling walker (2 wheeled) Transfers: Sit to/from Stand Sit to Stand: Min assist;From elevated surface         General transfer comment: assist to rise, stabilize. VCs safety, technique, hand placement  Ambulation/Gait Ambulation/Gait assistance: Min assist Ambulation Distance (Feet): 75 Feet Assistive device: Rolling walker (2 wheeled) Gait Pattern/deviations: Step-to pattern;Trunk flexed     General Gait Details: Assist to stabilize. Shoes intermittently catch during swing through. VCs safety, seqeunce.   Stairs            Wheelchair Mobility    Modified Rankin (Stroke Patients Only)       Balance                                             Pertinent Vitals/Pain Pain Assessment: 0-10 Pain Score: 5   Pain Location: L knee Pain Descriptors / Indicators: Sore Pain Intervention(s): Monitored during session;Repositioned;Ice applied    Home Living Family/patient expects to be discharged to:: Private residence Living Arrangements: Spouse/significant other Available Help at Discharge: Family           Home Equipment:  (safety frame around toilet)      Prior Function Level of Independence: Independent with assistive device(s)         Comments: with walking stick (wears bil AFOs; was stiff x 1 week and had assistance as nee)     Hand Dominance        Extremity/Trunk Assessment   Upper Extremity Assessment: Defer to OT evaluation           Lower Extremity Assessment: LLE deficits/detail;RLE deficits/detail RLE Deficits / Details: wears AFO. hip flex 3/5 LLE Deficits / Details: wears AFO  Cervical / Trunk Assessment: Normal  Communication   Communication: No difficulties  Cognition Arousal/Alertness: Awake/alert Behavior During Therapy: WFL for tasks assessed/performed Overall Cognitive Status: Within Functional Limits for tasks assessed                      General Comments      Exercises        Assessment/Plan    PT Assessment Patient needs continued PT services  PT Diagnosis Difficulty walking;Acute pain   PT Problem List Decreased strength;Decreased range of motion;Decreased activity  tolerance;Decreased balance;Decreased mobility;Decreased knowledge of use of DME;Pain  PT Treatment Interventions DME instruction;Gait training;Functional mobility training;Therapeutic activities;Patient/family education;Balance training;Therapeutic exercise   PT Goals (Current goals can be found in the Care Plan section) Acute Rehab PT Goals Patient Stated Goal: get back to being independent PT Goal Formulation: With patient Time For Goal Achievement: 06/07/16 Potential to Achieve Goals: Good    Frequency 7X/week   Barriers to discharge         Co-evaluation               End of Session Equipment Utilized During Treatment: Gait belt;Left knee immobilizer Activity Tolerance: Patient tolerated treatment well Patient left: in chair;with call bell/phone within reach           Time: 1020-1035 PT Time Calculation (min) (ACUTE ONLY): 15 min   Charges:   PT Evaluation $PT Eval Low Complexity: 1 Procedure     PT G Codes:        Weston Anna, MPT Pager: [phone]

## 2016-05-31 NOTE — Evaluation (Signed)
Occupational Therapy Evaluation Patient Details Name: LAVORIS RICKETSON MRN: [id number] DOB: 12-Dec-1936 Today's Date: 05/31/2016    History of Present Illness s/p L TKA   Clinical Impression   This 80 year old man was admitted for the above sx.  He had R TKA last year.  Reviewed all education and simulated toilet transfer (high commode with safety frame).  Pt verbalizes understanding.  No further OT is needed at this time    Follow Up Recommendations  No OT follow up;Supervision/Assistance - 24 hour    Equipment Recommendations  None recommended by OT    Recommendations for Other Services       Precautions / Restrictions Precautions Precautions: Knee Required Braces or Orthoses: Knee Immobilizer - Left Restrictions Weight Bearing Restrictions: No      Mobility Bed Mobility               General bed mobility comments: oob  Transfers Overall transfer level: Needs assistance Equipment used: Rolling walker (2 wheeled) Transfers: Sit to/from Stand Sit to Stand: Min guard         General transfer comment: for safety    Balance                                            ADL Overall ADL's : Needs assistance/impaired                         Toilet Transfer: Min guard;Ambulation;BSC;RW             General ADL Comments: Pt will have assistance for adls as needed.  Practiced commode transfer with 2 bars to push up from:  pt needs min guard.  He had already completed ADL and brushing teeth.  He believes he will be able to step over his tub.  Educated on readiness and safety: pt verbalizes understanding     Estate agent      Pertinent Vitals/Pain Pain Assessment: 0-10 Pain Score: 4  Pain Location: L knee Pain Descriptors / Indicators: Sore (stiff) Pain Intervention(s): Limited activity within patient's tolerance;Monitored during session;Premedicated before session;Repositioned;Ice applied     Hand  Dominance     Extremity/Trunk Assessment Upper Extremity Assessment Upper Extremity Assessment: Overall WFL for tasks assessed           Communication Communication Communication: No difficulties   Cognition Arousal/Alertness: Awake/alert Behavior During Therapy: WFL for tasks assessed/performed Overall Cognitive Status: Within Functional Limits for tasks assessed                     General Comments       Exercises       Shoulder Instructions      Home Living Family/patient expects to be discharged to:: Private residence Living Arrangements: Spouse/significant other Available Help at Discharge: Family Type of Home: House Home Access: Stairs to enter Technical brewer of Steps: 1   Home Layout: One level     Bathroom Shower/Tub: Teacher, early years/pre: Handicapped height     Home Equipment:  (safety frame around toilet)          Prior Functioning/Environment Level of Independence: Independent with assistive device(s)        Comments: with walking stick (wears bil AFOs; was stiff x 1 week and had  assistance as nee)    OT Diagnosis: Acute pain   OT Problem List:     OT Treatment/Interventions:      OT Goals(Current goals can be found in the care plan section) Acute Rehab OT Goals Patient Stated Goal: get back to being independent OT Goal Formulation: All assessment and education complete, DC therapy  OT Frequency:     Barriers to D/C:            Co-evaluation              End of Session    Activity Tolerance: Patient tolerated treatment well Patient left: in chair;with call bell/phone within reach   Time: 1101-1114 OT Time Calculation (min): 13 min Charges:  OT General Charges $OT Visit: 1 Procedure OT Evaluation $OT Eval Low Complexity: 1 Procedure G-Codes:    Leighann Amadon 2016/06/07, 12:21 PM  Lesle Chris, OTR/L [phone] 06/07/16

## 2016-05-31 NOTE — Progress Notes (Addendum)
Subjective: 1 Day Post-Op Procedure(s) (LRB): LEFT TOTAL KNEE ARTHROPLASTY (Left) Patient reports pain as mild.   Patient seen in rounds with Dr. Wynelle Link. Patient is well, and has had no acute complaints or problems. He is concerned about incontinence after removal of foley. No SOB or chest pain.  We will start therapy today.  Plan is to go Home after hospital stay.  Objective: Vital signs in last 24 hours: Temp:  [97.3 F (36.3 C)-98.3 F (36.8 C)] 97.7 F (36.5 C) (06/20 0555) Pulse Rate:  [64-78] 75 (06/20 0555) Resp:  [10-18] 16 (06/20 0555) BP: (109-135)/(55-85) 116/66 mmHg (06/20 0555) SpO2:  [95 %-100 %] 96 % (06/20 0555) Weight:  [99.791 kg (220 lb)] 99.791 kg (220 lb) (06/19 1241)  Intake/Output from previous day:  Intake/Output Summary (Last 24 hours) at 05/31/16 0709 Last data filed at 05/31/16 0555  Gross per 24 hour  Intake   2840 ml  Output   4015 ml  Net  -1175 ml     Labs:  Recent Labs  05/31/16 0415  HGB 11.6*    Recent Labs  05/31/16 0415  WBC 10.6*  RBC 3.99*  HCT 34.6*  PLT 243    Recent Labs  05/31/16 0415  NA 138  K 4.5  CL 107  CO2 24  BUN 12  CREATININE 0.88  GLUCOSE 131*  CALCIUM 8.9    EXAM General - Patient is Alert and Oriented Extremity - Neurologically intact Intact pulses distally Dorsiflexion/Plantar flexion intact Compartment soft Dressing - dressing C/D/I Motor Function - intact, moving foot and toes well on exam.  Hemovac pulled without difficulty.  Past Medical History  Diagnosis Date  . BPH (benign prostatic hyperplasia)     hx s/p turp  . Hyperlipidemia     takes Simvastatin and Tricor daily-under control  . Joint pain     both knees  . Joint swelling     rt knee  . Chronic back pain     spondylosis  . Melanoma ([place])     right hand;basal cell carcinoma  . H/O hiatal hernia   . History of colon polyps   . Diverticulitis   . Neuropathy ([place])   . Prostate infection     currently taking  macrobid  . Diarrhea     x 1 today  . History of chicken pox   . Urinary incontinence   . UTI (lower urinary tract infection)   . Back pain at L4-L5 level   . Arthritis     both knees  . DDD (degenerative disc disease), lumbar   . History of kidney stones   . Diabetes mellitus     takes Metformin and Victoza daily-under control  . Hepatitis     B-with Mono and jaundice 1960  . GERD (gastroesophageal reflux disease)     takes Omeprazole daily-under control  . Hypertension     takes Lisinopril daily-under control  . H/O measles   . Pneumonia     hx of  . Tinnitus   . Peripheral vascular disease ([place])     diabetic neuropathy  . Chronic kidney disease     kidney stones 1979    Assessment/Plan: 1 Day Post-Op Procedure(s) (LRB): LEFT TOTAL KNEE ARTHROPLASTY (Left) Active Problems:   OA (osteoarthritis) of knee  Estimated body mass index is 31.11 kg/(m^2) as calculated from the following:   Height as of this encounter: 5' 10.5" (1.791 m).   Weight as of this encounter: 99.791 kg (220  lb). Advance diet Up with therapy D/C IV fluids  DVT Prophylaxis - Xarelto Weight-Bearing as tolerated to left leg D/C O2 and Pulse OX and try on Room Air  Doing well. Will get up with therapy today. Plan for DC home tomorrow. Reassured patient that he can use urinal if he needs to after the removal of foley.  Ardeen Jourdain, PA-C Orthopaedic Surgery 05/31/2016, 7:09 AM

## 2016-05-31 NOTE — Progress Notes (Signed)
Physical Therapy Treatment Patient Details Name: Matthew Nash MRN: [id number] DOB: [birth year] Today's Date: 05/31/2016    History of Present Illness 80 yo male s/p L TKA 05/30/16. Hx of R TKA.     PT Comments    Progressing with mobility. Plan is for d/c home tomorrow.   Follow Up Recommendations  Outpatient PT;Supervision/Assistance - 24 hour     Equipment Recommendations  None recommended by PT    Recommendations for Other Services OT consult     Precautions / Restrictions Precautions Precautions: Knee;Fall Required Braces or Orthoses: Knee Immobilizer - Left Knee Immobilizer - Left: Discontinue once straight leg raise with < 10 degree lag Other Brace/Splint: bil AFOs Restrictions Weight Bearing Restrictions: No LLE Weight Bearing: Weight bearing as tolerated    Mobility  Bed Mobility Overal bed mobility: Needs Assistance Bed Mobility: Sit to Supine     Supine to sit: Min assist;HOB elevated Sit to supine: Min assist;HOB elevated   General bed mobility comments: Assist for L LE.   Transfers Overall transfer level: Needs assistance Equipment used: Rolling walker (2 wheeled) Transfers: Sit to/from Stand Sit to Stand: Min guard         General transfer comment: close guard for safety. Increased time.  VCs safety, technique, hand placement  Ambulation/Gait Ambulation/Gait assistance: Min guard;Min assist Ambulation Distance (Feet): 118 Feet Assistive device: Rolling walker (2 wheeled) Gait Pattern/deviations: Step-to pattern;Trunk flexed     General Gait Details: Intermittent assist to steady. Shoes intermittently catch during swing through. VCs safety, seqeunce.    Stairs            Wheelchair Mobility    Modified Rankin (Stroke Patients Only)       Balance                                    Cognition Arousal/Alertness: Awake/alert Behavior During Therapy: WFL for tasks assessed/performed Overall Cognitive Status:  Within Functional Limits for tasks assessed                      Exercises Total Joint Exercises Quad Sets: AROM;Both;10 reps;Supine Heel Slides: AAROM;Left;10 reps;Supine Hip ABduction/ADduction: AROM;10 reps;Left;Supine Straight Leg Raises: AROM;Left;10 reps;Supine Goniometric ROM: ~10-65 degrees    General Comments        Pertinent Vitals/Pain Pain Assessment: 0-10 Pain Score: 5  Pain Location: L knee Pain Descriptors / Indicators: Sore Pain Intervention(s): Monitored during session;Repositioned;Ice applied    Home Living Family/patient expects to be discharged to:: Private residence Living Arrangements: Spouse/significant other Available Help at Discharge: Family         Home Equipment:  (safety frame around toilet)      Prior Function Level of Independence: Independent with assistive device(s)      Comments: with walking stick (wears bil AFOs; was stiff x 1 week and had assistance as nee)   PT Goals (current goals can now be found in the care plan section) Acute Rehab PT Goals Patient Stated Goal: get back to being independent PT Goal Formulation: With patient Time For Goal Achievement: 06/07/16 Potential to Achieve Goals: Good Progress towards PT goals: Progressing toward goals    Frequency  7X/week    PT Plan Current plan remains appropriate    Co-evaluation             End of Session Equipment Utilized During Treatment: Gait belt;Left knee immobilizer (AFOs) Activity Tolerance: Patient  tolerated treatment well Patient left: in bed;with call bell/phone within reach;with family/visitor present     Time: [id number] PT Time Calculation (min) (ACUTE ONLY): 19 min  Charges:  $Gait Training: 8-22 mins                    G Codes:      Weston Anna, MPT Pager: [phone]

## 2016-05-31 NOTE — Care Management Note (Signed)
Case Management Note  Patient Details  Name: Matthew Nash MRN: [id number] Date of Birth: 11/11/1936  Subjective/Objective:                  LEFT TOTAL KNEE ARTHROPLASTY (Left) Action/Plan: Discharge planning Expected Discharge Date:  06/01/16             Expected Discharge Plan:  Home/Self Care  In-House Referral:     Discharge planning Services  CM Consult  Post Acute Care Choice:  NA Choice offered to:  NA  DME Arranged:  N/A DME Agency:  NA  HH Arranged:  NA HH Agency:  NA  Status of Service:  Completed, signed off  If discussed at [place] of Stay Meetings, dates discussed:    Additional Comments: CM met with pt in room to confirm plan is for outpt PT; pt confirms.  Pt has all DME needed at home and no other CM needs were communicated. Dellie Catholic, RN 05/31/2016, 2:22 PM

## 2016-06-01 LAB — CBC
HCT: 34.2 % — ABNORMAL LOW (ref 39.0–52.0)
Hemoglobin: 11.3 g/dL — ABNORMAL LOW (ref 13.0–17.0)
MCH: 28.7 pg (ref 26.0–34.0)
MCHC: 33 g/dL (ref 30.0–36.0)
MCV: 86.8 fL (ref 78.0–100.0)
PLATELETS: 222 10*3/uL (ref 150–400)
RBC: 3.94 MIL/uL — AB (ref 4.22–5.81)
RDW: 13.8 % (ref 11.5–15.5)
WBC: 8.7 10*3/uL (ref 4.0–10.5)

## 2016-06-01 LAB — BASIC METABOLIC PANEL
Anion gap: 5 (ref 5–15)
BUN: 14 mg/dL (ref 6–20)
CHLORIDE: 106 mmol/L (ref 101–111)
CO2: 26 mmol/L (ref 22–32)
CREATININE: 0.98 mg/dL (ref 0.61–1.24)
Calcium: 8.7 mg/dL — ABNORMAL LOW (ref 8.9–10.3)
GFR calc Af Amer: >60 mL/min (ref >60)
GFR calc non Af Amer: >60 mL/min (ref >60)
GLUCOSE: 141 mg/dL — AB (ref 65–99)
POTASSIUM: 3.7 mmol/L (ref 3.5–5.1)
Sodium: 137 mmol/L (ref 135–145)

## 2016-06-01 LAB — GLUCOSE, CAPILLARY: Glucose-Capillary: 156 mg/dL — ABNORMAL HIGH (ref 65–99)

## 2016-06-01 NOTE — Progress Notes (Signed)
Physical Therapy Treatment Patient Details Name: AVIRAJ ELLS MRN: [id number] DOB: 01-19-1936 Today's Date: 06/01/2016    History of Present Illness 80 yo male s/p L TKA 05/30/16. Hx of R TKA.     PT Comments    Progressing slowly with mobility. Reviewed/practiced exercises, gait training, and stair training (1 step). Discussed stepping up backwards using RW for step to get onto high bed (practiced as well.) Instructed pt to perform exercises 2x/day at home until he begins OP PT. Offered HEP handout but pt declined it. All education completed. Ready to d/c from PT standpoint.    Follow Up Recommendations  Outpatient PT;Supervision/Assistance - 24 hour     Equipment Recommendations  None recommended by PT    Recommendations for Other Services       Precautions / Restrictions Precautions Precautions: Knee Required Braces or Orthoses: Knee Immobilizer - Left Knee Immobilizer - Left: Discontinue once straight leg raise with < 10 degree lag Other Brace/Splint: bil AFOs Restrictions Weight Bearing Restrictions: No LLE Weight Bearing: Weight bearing as tolerated    Mobility  Bed Mobility Overal bed mobility: Needs Assistance Bed Mobility: Supine to Sit     Supine to sit: Min assist     General bed mobility comments: small amount of assist for L LE. Increased time.  Transfers Overall transfer level: Needs assistance Equipment used: Rolling walker (2 wheeled) Transfers: Sit to/from Stand Sit to Stand: Min assist;From elevated surface         General transfer comment: Small amount of assist to rise, stabilize, control descent. Increased time. VCs safety, hand placement.   Ambulation/Gait Ambulation/Gait assistance: Min assist Ambulation Distance (Feet): 100 Feet Assistive device: Rolling walker (2 wheeled) Gait Pattern/deviations: Step-to pattern;Trunk flexed;Antalgic     General Gait Details: Intermittent assist to steady. Shoes intermittently catch during swing  through. VCs safety, seqeunce.    Stairs Stairs: Yes Stairs assistance: Min assist Stair Management: Step to pattern;Backwards;With walker Number of Stairs: 1 General stair comments: VCs safety, technique, sequence. Assist to stabilize. Attempted to have pt practice going up forwards with RW but he declined-" no.[name] KitchenMarland KitchenI know how to do that already." Discussed stepping up backwards using RW for step to get onto high bed-pt stated "I'll figure it out."  Wheelchair Mobility    Modified Rankin (Stroke Patients Only)       Balance                                    Cognition Arousal/Alertness: Awake/alert Behavior During Therapy: WFL for tasks assessed/performed Overall Cognitive Status: Within Functional Limits for tasks assessed                      Exercises Total Joint Exercises Quad Sets: AROM;Both;10 reps;Supine Heel Slides: AAROM;Left;10 reps;Supine Hip ABduction/ADduction: AROM;10 reps;Left;Supine Straight Leg Raises: AROM;Left;10 reps;Supine Goniometric ROM: ~10-65 degrees    General Comments        Pertinent Vitals/Pain Pain Assessment: 0-10 Pain Score: 5  Pain Location: L knee Pain Descriptors / Indicators: Sore Pain Intervention(s): Monitored during session;Ice applied;Repositioned    Home Living                      Prior Function            PT Goals (current goals can now be found in the care plan section) Progress towards PT goals: Progressing toward goals  Frequency  7X/week    PT Plan Current plan remains appropriate    Co-evaluation             End of Session Equipment Utilized During Treatment: Gait belt Activity Tolerance: Patient limited by fatigue (noted some dyspnea and wheezing with activity) Patient left: in chair;with call bell/phone within reach     Time: 0852-0921 PT Time Calculation (min) (ACUTE ONLY): 29 min  Charges:  $Gait Training: 8-22 mins $Therapeutic Exercise: 8-22 mins                     G Codes:      Weston Anna, MPT Pager: [phone]

## 2016-06-01 NOTE — Progress Notes (Signed)
Subjective: 2 Days Post-Op Procedure(s) (LRB): LEFT TOTAL KNEE ARTHROPLASTY (Left) Patient reports pain as mild.   Patient seen in rounds with Dr. Wynelle Link. Patient is well, but has had some minor complaints of urinary incontinence, which has been an issue prior to his surgery. . No SOB or chest pain.  Plan is to go Home after hospital stay.  Objective: Vital signs in last 24 hours: Temp:  [97.7 F (36.5 C)-98.7 F (37.1 C)] 98.2 F (36.8 C) (06/21 0610) Pulse Rate:  [79-97] 90 (06/21 0811) Resp:  [15-16] 16 (06/21 0610) BP: (114-148)/(59-74) 133/74 mmHg (06/21 0811) SpO2:  [94 %-96 %] 94 % (06/21 0610)  Intake/Output from previous day:  Intake/Output Summary (Last 24 hours) at 06/01/16 0923 Last data filed at 06/01/16 0754  Gross per 24 hour  Intake    600 ml  Output   1200 ml  Net   -600 ml    Intake/Output this shift: Total I/O In: -  Out: 125 [Urine:125]  Labs:  Recent Labs  05/31/16 0415 06/01/16 0403  HGB 11.6* 11.3*    Recent Labs  05/31/16 0415 06/01/16 0403  WBC 10.6* 8.7  RBC 3.99* 3.94*  HCT 34.6* 34.2*  PLT 243 222    Recent Labs  05/31/16 0415 06/01/16 0403  NA 138 137  K 4.5 3.7  CL 107 106  CO2 24 26  BUN 12 14  CREATININE 0.88 0.98  GLUCOSE 131* 141*  CALCIUM 8.9 8.7*    EXAM General - Patient is Alert and Oriented Extremity - Neurologically intact Intact pulses distally Dorsiflexion/Plantar flexion intact No cellulitis present Compartment soft Dressing/Incision - clean, dry, no drainage Motor Function - intact, moving foot and toes well on exam.   Past Medical History  Diagnosis Date  . BPH (benign prostatic hyperplasia)     hx s/p turp  . Hyperlipidemia     takes Simvastatin and Tricor daily-under control  . Joint pain     both knees  . Joint swelling     rt knee  . Chronic back pain     spondylosis  . Melanoma ([place])     right hand;basal cell carcinoma  . H/O hiatal hernia   . History of colon polyps   .  Diverticulitis   . Neuropathy ([place])   . Prostate infection     currently taking macrobid  . Diarrhea     x 1 today  . History of chicken pox   . Urinary incontinence   . UTI (lower urinary tract infection)   . Back pain at L4-L5 level   . Arthritis     both knees  . DDD (degenerative disc disease), lumbar   . History of kidney stones   . Diabetes mellitus     takes Metformin and Victoza daily-under control  . Hepatitis     B-with Mono and jaundice 1960  . GERD (gastroesophageal reflux disease)     takes Omeprazole daily-under control  . Hypertension     takes Lisinopril daily-under control  . H/O measles   . Pneumonia     hx of  . Tinnitus   . Peripheral vascular disease ([place])     diabetic neuropathy  . Chronic kidney disease     kidney stones 1979    Assessment/Plan: 2 Days Post-Op Procedure(s) (LRB): LEFT TOTAL KNEE ARTHROPLASTY (Left) Active Problems:   OA (osteoarthritis) of knee  Estimated body mass index is 31.11 kg/(m^2) as calculated from the following:   Height as  of this encounter: 5' 10.5" (1.791 m).   Weight as of this encounter: 99.791 kg (220 lb). Advance diet Up with therapy Discharge home with home health  DVT Prophylaxis - Xarelto Weight-Bearing as tolerated to left leg  Continue with therapy today. Plan for DC home after therapy. Encouraged him to see his urologist again.   Ardeen Jourdain, PA-C Orthopaedic Surgery 06/01/2016, 9:23 AM

## 2016-06-01 NOTE — Discharge Summary (Signed)
Physician Discharge Summary   Patient ID: Matthew Nash MRN: [id number] DOB/AGE: 13-Oct-1936 80 y.o.  Admit date: 05/30/2016 Discharge date: 06/01/2016  Primary Diagnosis: Primary osteoarthritis left knee  Admission Diagnoses:  Past Medical History  Diagnosis Date  . BPH (benign prostatic hyperplasia)     hx s/p turp  . Hyperlipidemia     takes Simvastatin and Tricor daily-under control  . Joint pain     both knees  . Joint swelling     rt knee  . Chronic back pain     spondylosis  . Melanoma ([place])     right hand;basal cell carcinoma  . H/O hiatal hernia   . History of colon polyps   . Diverticulitis   . Neuropathy ([place])   . Prostate infection     currently taking macrobid  . Diarrhea     x 1 today  . History of chicken pox   . Urinary incontinence   . UTI (lower urinary tract infection)   . Back pain at L4-L5 level   . Arthritis     both knees  . DDD (degenerative disc disease), lumbar   . History of kidney stones   . Diabetes mellitus     takes Metformin and Victoza daily-under control  . Hepatitis     B-with Mono and jaundice 1960  . GERD (gastroesophageal reflux disease)     takes Omeprazole daily-under control  . Hypertension     takes Lisinopril daily-under control  . H/O measles   . Pneumonia     hx of  . Tinnitus   . Peripheral vascular disease ([place])     diabetic neuropathy  . Chronic kidney disease     kidney stones 1979   Discharge Diagnoses:   Active Problems:   OA (osteoarthritis) of knee  Estimated body mass index is 31.11 kg/(m^2) as calculated from the following:   Height as of this encounter: 5' 10.5" (1.791 m).   Weight as of this encounter: 99.791 kg (220 lb).  Procedure:  Procedure(s) (LRB): LEFT TOTAL KNEE ARTHROPLASTY (Left)   Consults: None  HPI: The patient is being followed for their left osteoarthritis. He is now months out from last injection in Euflexxa series. Symptoms reported include: pain. The patient feels that  they are doing poorly and the patient states he is 10% better following the injections. The following medication has been used for pain control: none. The patient has not gotten any relief of their symptoms with viscosupplementation. He states the right knee is doing great at this time. Left knee has become increasing problematic. Not only does he have pain, but his neuropathy is also very problematic. He feels like the dysfunction he has had from his knee has made the neuropathy more problematic also. At this point, the most predictable means of improving pain and function is total knee arthroplasty. The procedure, risks, potential complications and rehab course are discussed in detail and the patient elects to proceed. The goals of this procedure are decreased pain and increased function. There is a high liklihood that both of these goals will be achieved. He has got advanced arthritis in that left knee with bone-on-bone medial and patellofemoral. Unfortunately, the visco supplements did not provide a lot of benefit. He would like to go ahead and get this fixed. I told him the most predictable means of getting it better is total knee arthroplasty. I am concerned about the neuropathy and told him that there is some evidence that if we  use a tourniquet during surgery it can make the neuropathy worse. Thus, we would not use a tourniquet for his procedure. We can do this without a tourniquet. They have been treated conservatively in the past for the above stated problem and despite conservative measures, they continue to have progressive pain and severe functional limitations and dysfunction. They have failed non-operative management including home exercise, medications, and injections. It is felt that they would benefit from undergoing total joint replacement. Risks and benefits of the procedure have been discussed with the patient and they elect to proceed with surgery. There are no active contraindications to  surgery such as ongoing infection or rapidly progressive neurological disease.  Laboratory Data: Admission on 05/30/2016  Component Date Value Ref Range Status  . Glucose-Capillary 05/30/2016 115* 65 - 99 mg/dL Final  . Comment 1 05/30/2016 Notify RN   Final  . Glucose-Capillary 05/30/2016 103* 65 - 99 mg/dL Final  . Glucose-Capillary 05/30/2016 122* 65 - 99 mg/dL Final  . Glucose-Capillary 05/30/2016 134* 65 - 99 mg/dL Final  . WBC 05/31/2016 10.6* 4.0 - 10.5 K/uL Final  . RBC 05/31/2016 3.99* 4.22 - 5.81 MIL/uL Final  . Hemoglobin 05/31/2016 11.6* 13.0 - 17.0 g/dL Final  . HCT 05/31/2016 34.6* 39.0 - 52.0 % Final  . MCV 05/31/2016 86.7  78.0 - 100.0 fL Final  . MCH 05/31/2016 29.1  26.0 - 34.0 pg Final  . MCHC 05/31/2016 33.5  30.0 - 36.0 g/dL Final  . RDW 05/31/2016 13.5  11.5 - 15.5 % Final  . Platelets 05/31/2016 243  150 - 400 K/uL Final  . Sodium 05/31/2016 138  135 - 145 mmol/L Final  . Potassium 05/31/2016 4.5  3.5 - 5.1 mmol/L Final  . Chloride 05/31/2016 107  101 - 111 mmol/L Final  . CO2 05/31/2016 24  22 - 32 mmol/L Final  . Glucose, Bld 05/31/2016 131* 65 - 99 mg/dL Final  . BUN 05/31/2016 12  6 - 20 mg/dL Final  . Creatinine, Ser 05/31/2016 0.88  0.61 - 1.24 mg/dL Final  . Calcium 05/31/2016 8.9  8.9 - 10.3 mg/dL Final  . GFR calc non Af Amer 05/31/2016 >60  >60 mL/min Final  . GFR calc Af Amer 05/31/2016 >60  >60 mL/min Final   Comment: (NOTE) The eGFR has been calculated using the CKD EPI equation. This calculation has not been validated in all clinical situations. eGFR's persistently <60 mL/min signify possible Chronic Kidney Disease.   . Anion gap 05/31/2016 7  5 - 15 Final  . Glucose-Capillary 05/30/2016 169* 65 - 99 mg/dL Final  . Comment 1 05/30/2016 Notify RN   Final  . Comment 2 05/30/2016 Document in Chart   Final  . Glucose-Capillary 05/30/2016 136* 65 - 99 mg/dL Final  . Glucose-Capillary 05/31/2016 109* 65 - 99 mg/dL Final  . WBC 06/01/2016 8.7   4.0 - 10.5 K/uL Final  . RBC 06/01/2016 3.94* 4.22 - 5.81 MIL/uL Final  . Hemoglobin 06/01/2016 11.3* 13.0 - 17.0 g/dL Final  . HCT 06/01/2016 34.2* 39.0 - 52.0 % Final  . MCV 06/01/2016 86.8  78.0 - 100.0 fL Final  . MCH 06/01/2016 28.7  26.0 - 34.0 pg Final  . MCHC 06/01/2016 33.0  30.0 - 36.0 g/dL Final  . RDW 06/01/2016 13.8  11.5 - 15.5 % Final  . Platelets 06/01/2016 222  150 - 400 K/uL Final  . Sodium 06/01/2016 137  135 - 145 mmol/L Final  . Potassium 06/01/2016 3.7  3.5 -  5.1 mmol/L Final   Comment: DELTA CHECK NOTED REPEATED TO VERIFY   . Chloride 06/01/2016 106  101 - 111 mmol/L Final  . CO2 06/01/2016 26  22 - 32 mmol/L Final  . Glucose, Bld 06/01/2016 141* 65 - 99 mg/dL Final  . BUN 06/01/2016 14  6 - 20 mg/dL Final  . Creatinine, Ser 06/01/2016 0.98  0.61 - 1.24 mg/dL Final  . Calcium 06/01/2016 8.7* 8.9 - 10.3 mg/dL Final  . GFR calc non Af Amer 06/01/2016 >60  >60 mL/min Final  . GFR calc Af Amer 06/01/2016 >60  >60 mL/min Final   Comment: (NOTE) The eGFR has been calculated using the CKD EPI equation. This calculation has not been validated in all clinical situations. eGFR's persistently <60 mL/min signify possible Chronic Kidney Disease.   . Anion gap 06/01/2016 5  5 - 15 Final  . Glucose-Capillary 05/31/2016 94  65 - 99 mg/dL Final  . Glucose-Capillary 05/31/2016 130* 65 - 99 mg/dL Final  . Glucose-Capillary 06/01/2016 156* 65 - 99 mg/dL Final  Hospital Outpatient Visit on 05/23/2016  Component Date Value Ref Range Status  . MRSA, PCR 05/23/2016 NEGATIVE  NEGATIVE Final  . Staphylococcus aureus 05/23/2016 NEGATIVE  NEGATIVE Final   Comment:        The Xpert SA Assay (FDA approved for NASAL specimens in patients over 8 years of age), is one component of a comprehensive surveillance program.  Test performance has been validated by [facility] for patients greater than or equal to 19 year old. It is not intended to diagnose infection nor to guide or  monitor treatment.   [name] Kitchen aPTT 05/23/2016 27  24 - 37 seconds Final  . Sodium 05/23/2016 139  135 - 145 mmol/L Final  . Potassium 05/23/2016 4.8  3.5 - 5.1 mmol/L Final  . Chloride 05/23/2016 107  101 - 111 mmol/L Final  . CO2 05/23/2016 24  22 - 32 mmol/L Final  . Glucose, Bld 05/23/2016 99  65 - 99 mg/dL Final  . BUN 05/23/2016 20  6 - 20 mg/dL Final  . Creatinine, Ser 05/23/2016 1.10  0.61 - 1.24 mg/dL Final  . Calcium 05/23/2016 9.8  8.9 - 10.3 mg/dL Final  . Total Protein 05/23/2016 7.4  6.5 - 8.1 g/dL Final  . Albumin 05/23/2016 4.5  3.5 - 5.0 g/dL Final  . AST 05/23/2016 14* 15 - 41 U/L Final  . ALT 05/23/2016 20  17 - 63 U/L Final  . Alkaline Phosphatase 05/23/2016 43  38 - 126 U/L Final  . Total Bilirubin 05/23/2016 0.6  0.3 - 1.2 mg/dL Final  . GFR calc non Af Amer 05/23/2016 >60  >60 mL/min Final  . GFR calc Af Amer 05/23/2016 >60  >60 mL/min Final   Comment: (NOTE) The eGFR has been calculated using the CKD EPI equation. This calculation has not been validated in all clinical situations. eGFR's persistently <60 mL/min signify possible Chronic Kidney Disease.   . Anion gap 05/23/2016 8  5 - 15 Final  . Prothrombin Time 05/23/2016 13.6  11.6 - 15.2 seconds Final  . INR 05/23/2016 1.02  0.00 - 1.49 Final  . ABO/RH(D) 05/23/2016 O NEG   Final  . Antibody Screen 05/23/2016 NEG   Final  . Sample Expiration 05/23/2016 06/02/2016   Final  . Extend sample reason 05/23/2016 NO TRANSFUSIONS OR PREGNANCY IN THE PAST 3 MONTHS   Final  . Color, Urine 05/23/2016 YELLOW  YELLOW Final  . APPearance 05/23/2016 CLEAR  CLEAR Final  . Specific Gravity, Urine 05/23/2016 1.016  1.005 - 1.030 Final  . pH 05/23/2016 5.5  5.0 - 8.0 Final  . Glucose, UA 05/23/2016 NEGATIVE  NEGATIVE mg/dL Final  . Hgb urine dipstick 05/23/2016 NEGATIVE  NEGATIVE Final  . Bilirubin Urine 05/23/2016 NEGATIVE  NEGATIVE Final  . Ketones, ur 05/23/2016 NEGATIVE  NEGATIVE mg/dL Final  . Protein, ur 05/23/2016  NEGATIVE  NEGATIVE mg/dL Final  . Nitrite 05/23/2016 NEGATIVE  NEGATIVE Final  . Leukocytes, UA 05/23/2016 NEGATIVE  NEGATIVE Final   MICROSCOPIC NOT DONE ON URINES WITH NEGATIVE PROTEIN, BLOOD, LEUKOCYTES, NITRITE, OR GLUCOSE <1000 mg/dL.  Office Visit on 05/18/2016  Component Date Value Ref Range Status  . WBC 05/18/2016 6.7  4.0 - 10.5 K/uL Final  . RBC 05/18/2016 4.69  4.22 - 5.81 Mil/uL Final  . Platelets 05/18/2016 250.0  150.0 - 400.0 K/uL Final  . Hemoglobin 05/18/2016 13.3  13.0 - 17.0 g/dL Final  . HCT 05/18/2016 40.1  39.0 - 52.0 % Final  . MCV 05/18/2016 85.4  78.0 - 100.0 fl Final  . MCHC 05/18/2016 33.2  30.0 - 36.0 g/dL Final  . RDW 05/18/2016 13.9  11.5 - 15.5 % Final  . Sodium 05/18/2016 141  135 - 145 mEq/L Final  . Potassium 05/18/2016 4.9  3.5 - 5.1 mEq/L Final  . Chloride 05/18/2016 108  96 - 112 mEq/L Final  . CO2 05/18/2016 28  19 - 32 mEq/L Final  . Glucose, Bld 05/18/2016 98  70 - 99 mg/dL Final  . BUN 05/18/2016 21  6 - 23 mg/dL Final  . Creatinine, Ser 05/18/2016 1.04  0.40 - 1.50 mg/dL Final  . Calcium 05/18/2016 10.0  8.4 - 10.5 mg/dL Final  . GFR 05/18/2016 73.39  >60.00 mL/min Final  . Cholesterol 05/18/2016 113  0 - 200 mg/dL Final   ATP III Classification       Desirable:  < 200 mg/dL               Borderline High:  200 - 239 mg/dL          High:  > = 240 mg/dL  . Triglycerides 05/18/2016 90.0  0.0 - 149.0 mg/dL Final   Normal:  <150 mg/dLBorderline High:  150 - 199 mg/dL  . HDL 05/18/2016 43.40  >39.00 mg/dL Final  . VLDL 05/18/2016 18.0  0.0 - 40.0 mg/dL Final  . LDL Cholesterol 05/18/2016 52  0 - 99 mg/dL Final  . Total CHOL/HDL Ratio 05/18/2016 3   Final                  Men          Women1/2 Average Risk     3.4          3.3Average Risk          5.0          4.42X Average Risk          9.6          7.13X Average Risk          15.0          11.0                      . NonHDL 05/18/2016 69.88   Final   NOTE:  Non-HDL goal should be 30 mg/dL higher  than patient's LDL goal (i.e. LDL goal of < 70 mg/dL, would have non-HDL goal  of < 100 mg/dL)  . Hgb A1c MFr Bld 05/18/2016 6.3  4.6 - 6.5 % Final   Glycemic Control Guidelines for People with Diabetes:Non Diabetic:  <6%Goal of Therapy: <7%Additional Action Suggested:  >8%      X-Rays:No results found.  EKG: Orders placed or performed in visit on 05/18/16  . EKG 12-Lead     Hospital Course: COBIN CADAVID is a 80 y.o. who was admitted to [facility]. They were brought to the operating room on 05/30/2016 and underwent Procedure(s): LEFT TOTAL KNEE ARTHROPLASTY.  Patient tolerated the procedure well and was later transferred to the recovery room and then to the orthopaedic floor for postoperative care.  They were given PO and IV analgesics for pain control following their surgery.  They were given 24 hours of postoperative antibiotics of  Anti-infectives    Start     Dose/Rate Route Frequency Ordered Stop   05/30/16 1600  ceFAZolin (ANCEF) IVPB 2g/100 mL premix     2 g 200 mL/hr over 30 Minutes Intravenous Every 6 hours 05/30/16 1251 05/30/16 2302   05/30/16 0743  ceFAZolin (ANCEF) IVPB 2g/100 mL premix     2 g 200 mL/hr over 30 Minutes Intravenous On call to O.R. 05/30/16 0743 05/30/16 1000     and started on DVT prophylaxis in the form of Xarelto.   PT and OT were ordered for total joint protocol.  Discharge planning consulted to help with postop disposition and equipment needs.  Patient had a good night on the evening of surgery.  They started to get up OOB with therapy on day one. Hemovac drain was pulled without difficulty.  Continued to work with therapy into day two.  Dressing was changed on day two and the incision was clean and dry. He has continued to have some issues with urinary incontinence. The patient had progressed with therapy and meeting their goals.  Incision was healing well.  Patient was seen in rounds and was ready to go home.   Diet: Cardiac diet and Diabetic  diet Activity:WBAT Follow-up:in 2 weeks Disposition - Home Discharged Condition: stable   Discharge Instructions    Call MD / Call 911    Complete by:  As directed   If you experience chest pain or shortness of breath, CALL 911 and be transported to the hospital emergency room.  If you develope a fever above 101 F, pus (white drainage) or increased drainage or redness at the wound, or calf pain, call your surgeon's office.     Constipation Prevention    Complete by:  As directed   Drink plenty of fluids.  Prune juice may be helpful.  You may use a stool softener, such as Colace (over the counter) 100 mg twice a day.  Use MiraLax (over the counter) for constipation as needed.     Diet - low sodium heart healthy    Complete by:  As directed      Diet Carb Modified    Complete by:  As directed      Discharge instructions    Complete by:  As directed   Dr. Gaynelle Arabian Total Joint Specialist [facility] [address] [place], [place] [address] [phone]  TOTAL KNEE REPLACEMENT POSTOPERATIVE DIRECTIONS  Knee Rehabilitation, Guidelines Following Surgery  Results after knee surgery are often greatly improved when you follow the exercise, range of motion and muscle strengthening exercises prescribed by your doctor. Safety measures are also important to protect the knee  from further injury. Any time any of these exercises cause you to have increased pain or swelling in your knee joint, decrease the amount until you are comfortable again and slowly increase them. If you have problems or questions, call your caregiver or physical therapist for advice.   HOME CARE INSTRUCTIONS  Remove items at home which could result in a fall. This includes throw rugs or furniture in walking pathways.  ICE to the affected knee every three hours for 30 minutes at a time and then as needed for pain and swelling.  Continue to use ice on the knee for pain and swelling from surgery. You  may notice swelling that will progress down to the foot and ankle.  This is normal after surgery.  Elevate the leg when you are not up walking on it.   Continue to use the breathing machine which will help keep your temperature down.  It is common for your temperature to cycle up and down following surgery, especially at night when you are not up moving around and exerting yourself.  The breathing machine keeps your lungs expanded and your temperature down. Do not place pillow under knee, focus on keeping the knee straight while resting  DIET You may resume your previous home diet once your are discharged from the hospital.  DRESSING / WOUND CARE / SHOWERING You may start showering once you are discharged home but do not submerge the incision under water. Just pat the incision dry and apply a dry gauze dressing on daily. Change the surgical dressing daily and reapply a dry dressing each time.  ACTIVITY Walk with your walker as instructed. Use walker as long as suggested by your caregivers. Avoid periods of inactivity such as sitting longer than an hour when not asleep. This helps prevent blood clots.  You may resume a sexual relationship in one month or when given the OK by your doctor.  You may return to work once you are cleared by your doctor.  Do not drive a car for 6 weeks or until released by you surgeon.  Do not drive while taking narcotics.  WEIGHT BEARING Weight bearing as tolerated with assist device (walker, cane, etc) as directed, use it as long as suggested by your surgeon or therapist, typically at least 4-6 weeks.  POSTOPERATIVE CONSTIPATION PROTOCOL Constipation - defined medically as fewer than three stools per week and severe constipation as less than one stool per week.  One of the most common issues patients have following surgery is constipation.  Even if you have a regular bowel pattern at home, your normal regimen is likely to be disrupted due to multiple reasons  following surgery.  Combination of anesthesia, postoperative narcotics, change in appetite and fluid intake all can affect your bowels.  In order to avoid complications following surgery, here are some recommendations in order to help you during your recovery period.  Colace (docusate) - Pick up an over-the-counter form of Colace or another stool softener and take twice a day as long as you are requiring postoperative pain medications.  Take with a full glass of water daily.  If you experience loose stools or diarrhea, hold the colace until you stool forms back up.  If your symptoms do not get better within 1 week or if they get worse, check with your doctor.  Dulcolax (bisacodyl) - Pick up over-the-counter and take as directed by the product packaging as needed to assist with the movement of your bowels.  Take with a  full glass of water.  Use this product as needed if not relieved by Colace only.   MiraLax (polyethylene glycol) - Pick up over-the-counter to have on hand.  MiraLax is a solution that will increase the amount of water in your bowels to assist with bowel movements.  Take as directed and can mix with a glass of water, juice, soda, coffee, or tea.  Take if you go more than two days without a movement. Do not use MiraLax more than once per day. Call your doctor if you are still constipated or irregular after using this medication for 7 days in a row.  If you continue to have problems with postoperative constipation, please contact the office for further assistance and recommendations.  If you experience "the worst abdominal pain ever" or develop nausea or vomiting, please contact the office immediatly for further recommendations for treatment.  ITCHING  If you experience itching with your medications, try taking only a single pain pill, or even half a pain pill at a time.  You can also use Benadryl over the counter for itching or also to help with sleep.   TED HOSE STOCKINGS Wear the  elastic stockings on both legs for three weeks following surgery during the day but you may remove then at night for sleeping.  MEDICATIONS See your medication summary on the "After Visit Summary" that the nursing staff will review with you prior to discharge.  You may have some home medications which will be placed on hold until you complete the course of blood thinner medication.  It is important for you to complete the blood thinner medication as prescribed by your surgeon.  Continue your approved medications as instructed at time of discharge.  PRECAUTIONS If you experience chest pain or shortness of breath - call 911 immediately for transfer to the hospital emergency department.  If you develop a fever greater that 101 F, purulent drainage from wound, increased redness or drainage from wound, foul odor from the wound/dressing, or calf pain - CONTACT YOUR SURGEON.                                                   FOLLOW-UP APPOINTMENTS Make sure you keep all of your appointments after your operation with your surgeon and caregivers. You should call the office at the above phone number and make an appointment for approximately two weeks after the date of your surgery or on the date instructed by your surgeon outlined in the "After Visit Summary".   RANGE OF MOTION AND STRENGTHENING EXERCISES  Rehabilitation of the knee is important following a knee injury or an operation. After just a few days of immobilization, the muscles of the thigh which control the knee become weakened and shrink (atrophy). Knee exercises are designed to build up the tone and strength of the thigh muscles and to improve knee motion. Often times heat used for twenty to thirty minutes before working out will loosen up your tissues and help with improving the range of motion but do not use heat for the first two weeks following surgery. These exercises can be done on a training (exercise) mat, on the floor, on a table or on a  bed. Use what ever works the best and is most comfortable for you Knee exercises include:  Leg Lifts - While your knee is still  immobilized in a splint or cast, you can do straight leg raises. Lift the leg to 60 degrees, hold for 3 sec, and slowly lower the leg. Repeat 10-20 times 2-3 times daily. Perform this exercise against resistance later as your knee gets better.  Quad and Hamstring Sets - Tighten up the muscle on the front of the thigh (Quad) and hold for 5-10 sec. Repeat this 10-20 times hourly. Hamstring sets are done by pushing the foot backward against an object and holding for 5-10 sec. Repeat as with quad sets.  Leg Slides: Lying on your back, slowly slide your foot toward your buttocks, bending your knee up off the floor (only go as far as is comfortable). Then slowly slide your foot back down until your leg is flat on the floor again. Angel Wings: Lying on your back spread your legs to the side as far apart as you can without causing discomfort.  A rehabilitation program following serious knee injuries can speed recovery and prevent re-injury in the future due to weakened muscles. Contact your doctor or a physical therapist for more information on knee rehabilitation.   IF YOU ARE TRANSFERRED TO A SKILLED REHAB FACILITY If the patient is transferred to a skilled rehab facility following release from the hospital, a list of the current medications will be sent to the facility for the patient to continue.  When discharged from the skilled rehab facility, please have the facility set up the patient's [place] prior to being released. Also, the skilled facility will be responsible for providing the patient with their medications at time of release from the facility to include their pain medication, the muscle relaxants, and their blood thinner medication. If the patient is still at the rehab facility at time of the two week follow up appointment, the skilled rehab facility will  also need to assist the patient in arranging follow up appointment in our office and any transportation needs.  MAKE SURE YOU:  Understand these instructions.  Get help right away if you are not doing well or get worse.    Pick up stool softner and laxative for home use following surgery while on pain medications. Do not submerge incision under water. Please use good hand washing techniques while changing dressing each day. May shower starting three days after surgery. Please use a clean towel to pat the incision dry following showers. Continue to use ice for pain and swelling after surgery. Do not use any lotions or creams on the incision until instructed by your surgeon.     Increase activity slowly as tolerated    Complete by:  As directed             Medication List    STOP taking these medications        aspirin EC 81 MG tablet     celecoxib 200 MG capsule  Commonly known as:  CELEBREX     Melatonin 5 MG Tabs     MENS MULTIVITAMIN PLUS Tabs     OVER THE COUNTER MEDICATION     vitamin C 500 MG tablet  Commonly known as:  ASCORBIC ACID      TAKE these medications        desmopressin 0.1 MG tablet  Commonly known as:  DDAVP  Take 0.1 mg by mouth at bedtime.     fenofibrate 145 MG tablet  Commonly known as:  TRICOR  Take 1 tablet (145 mg total) by mouth every evening.  FIBER PO  Take 3 capsules by mouth 2 (two) times daily.     freestyle lancets  1 each by Other route 2 (two) times daily. Use as instructed     glucose blood test strip  Commonly known as:  FREESTYLE LITE  Use as instructed to test Blood Glucose twice daily Dx: E11.9     Insulin Pen Needle 31G X 8 MM Misc  Commonly known as:  CAREFINE PEN NEEDLES  Use once daily with Victoza injections.     Liraglutide 18 MG/3ML Sopn  Inject 0.3 mLs (1.8 mg total) into the skin daily.     lisinopril 10 MG tablet  Commonly known as:  PRINIVIL,ZESTRIL  Take 1 tablet (10 mg total) by mouth every  evening.     metFORMIN 500 MG 24 hr tablet  Commonly known as:  GLUCOPHAGE-XR  Take 1 tablet (500 mg total) by mouth 2 (two) times daily with a meal.     methocarbamol 500 MG tablet  Commonly known as:  ROBAXIN  Take 1 tablet (500 mg total) by mouth every 6 (six) hours as needed for muscle spasms.     omeprazole 20 MG capsule  Commonly known as:  PRILOSEC  Take 1 capsule (20 mg total) by mouth daily.     oxyCODONE 5 MG immediate release tablet  Commonly known as:  Oxy IR/ROXICODONE  Take 1-2 tablets (5-10 mg total) by mouth every 3 (three) hours as needed for breakthrough pain.     PROBIOTIC PO  Take 1 capsule by mouth daily.     rivaroxaban 10 MG Tabs tablet  Commonly known as:  XARELTO  Take 1 tablet (10 mg total) by mouth daily with breakfast.     simvastatin 10 MG tablet  Commonly known as:  ZOCOR  Take 1 tablet (10 mg total) by mouth at bedtime.     tamsulosin 0.4 MG Caps capsule  Commonly known as:  FLOMAX  Take 0.4 mg by mouth daily.     traMADol 50 MG tablet  Commonly known as:  ULTRAM  Take 1-2 tablets (50-100 mg total) by mouth every 6 (six) hours as needed for moderate pain.           Follow-up Information    Follow up with Gearlean Alf, MD. Schedule an appointment as soon as possible for a visit on 06/17/2016.   Specialty:  Orthopedic Surgery   Contact information:   [address] [place] [address] [phone]       Signed: Ardeen Jourdain, PA-C Orthopaedic Surgery 06/01/2016, 9:25 AM

## 2016-06-03 ENCOUNTER — Ambulatory Visit: Payer: Medicare Other | Attending: Orthopedic Surgery | Admitting: Physical Therapy

## 2016-06-03 DIAGNOSIS — M25662 Stiffness of left knee, not elsewhere classified: Secondary | ICD-10-CM | POA: Diagnosis not present

## 2016-06-03 DIAGNOSIS — M25562 Pain in left knee: Secondary | ICD-10-CM | POA: Diagnosis not present

## 2016-06-03 DIAGNOSIS — R269 Unspecified abnormalities of gait and mobility: Secondary | ICD-10-CM | POA: Diagnosis not present

## 2016-06-03 DIAGNOSIS — R2689 Other abnormalities of gait and mobility: Secondary | ICD-10-CM

## 2016-06-03 NOTE — Patient Instructions (Signed)
   Ankle Pump  Bend ankles up and down, alternating feet. Repeat _10-15___ times. Do _2-3___ sessions per day.  Quad Set  With other leg bent, foot flat, slowly tighten muscles on thigh of straight leg while counting out loud to __5__.  Repeat _10___ times. Do __3-4__ sessions per day.  Hip Flexion / Knee Extension: Straight-Leg Raise (Eccentric)  Lie on back. Lift leg with knee straight. Slowly lower leg for 3-5 seconds. _10__ reps per set, _3-4__ sets per day.   Heel Slide  Bend left knee and pull heel toward buttocks. Use sheet, strap, or rope to actively assist knee further into flexion. Hold stretch 10 secs.  Repeat __10__ times. Do _3-4___ sessions per day.   KNEE: Extension, Short Arc Quads - Supine    Place bolster under knees. Raise one leg until knee is straight. _10__ reps per set, _3-4__ sets per day.   Copyright  VHI. All rights reserved.

## 2016-06-03 NOTE — Therapy (Signed)
[facility] [address]  [place], [place], [address] Phone: [phone]   Fax:  [phone]  Physical Therapy Evaluation  Patient Details  Name: Matthew Nash MRN: [id number] Date of Birth: May 19, 1936 Referring Provider: Dr. Gaynelle Arabian  Encounter Date: 06/03/2016      PT End of Session - 06/03/16 1111    Visit Number 1   Number of Visits 16   Date for PT Re-Evaluation 07/29/16   Authorization Type [employer] - no PTA   PT Start Time 1015   PT Stop Time 1110   PT Time Calculation (min) 55 min   Activity Tolerance Patient tolerated treatment well   Behavior During Therapy [facility] for tasks assessed/performed      Past Medical History  Diagnosis Date  . BPH (benign prostatic hyperplasia)     hx s/p turp  . Hyperlipidemia     takes Simvastatin and Tricor daily-under control  . Joint pain     both knees  . Joint swelling     rt knee  . Chronic back pain     spondylosis  . Melanoma ([place])     right hand;basal cell carcinoma  . H/O hiatal hernia   . History of colon polyps   . Diverticulitis   . Neuropathy ([place])   . Prostate infection     currently taking macrobid  . Diarrhea     x 1 today  . History of chicken pox   . Urinary incontinence   . UTI (lower urinary tract infection)   . Back pain at L4-L5 level   . Arthritis     both knees  . DDD (degenerative disc disease), lumbar   . History of kidney stones   . Diabetes mellitus     takes Metformin and Victoza daily-under control  . Hepatitis     B-with Mono and jaundice 1960  . GERD (gastroesophageal reflux disease)     takes Omeprazole daily-under control  . Hypertension     takes Lisinopril daily-under control  . H/O measles   . Pneumonia     hx of  . Tinnitus   . Peripheral vascular disease ([place])     diabetic neuropathy  . Chronic kidney disease     kidney stones 1979    Past Surgical History  Procedure Laterality Date  . Shoulder  arthroscopy w/ rotator cuff repair Left NOV 2010  . Transurethral resection of prostate  2006  . Cortisone injection    . I&d left knee Left 1958    states 22 times  . C5 and t1 bone chip removed   1986  . Cholecystectomy  1993  . Right shoulder surgery  2012    rotator cuff repair  . Colonoscopy  2011  . Lumbar laminectomy/decompression microdiscectomy Right 02/17/2014    Procedure: RIGHT LUMBAR TWO-THREE LAMINECTOMY;  Surgeon: Charlie Pitter, MD;  Location: [facility];  Service: Neurosurgery;  Laterality: Right;  right   . Knee arthroscopy Right 07/16/2014    Procedure: RIGHT ARTHROSCOPY KNEE WITH Medial and Lateral DEBRIDEMENT and chondroplasty;  Surgeon: Gearlean Alf, MD;  Location: [facility];  Service: Orthopedics;  Laterality: Right;  . Wisdom tooth extraction    . Basal cell carcinoma excision    . Vasectomy  1983  . Tonsillectomy  1942  . Cauterize inner nose  1957  . Total knee arthroplasty Right 05/27/2015    Procedure: RIGHT TOTAL KNEE ARTHROPLASTY;  Surgeon: Gaynelle Arabian,  MD;  Location: [facility];  Service: Orthopedics;  Laterality: Right;  . Total knee arthroplasty Left 05/30/2016    Procedure: LEFT TOTAL KNEE ARTHROPLASTY;  Surgeon: Gaynelle Arabian, MD;  Location: [facility];  Service: Orthopedics;  Laterality: Left;    There were no vitals filed for this visit.       Subjective Assessment - 06/03/16 1020    Subjective Pt is a 80 y/o male who presents to OPPT s/p L TKA on 05/30/16 (pt is on POD #4).  Pt presents today with decreased mobility and ROM affecting safe, functional mobility.   Pertinent History see EPIC snapshot;    How long can you sit comfortably? 90-120 min   How long can you stand comfortably? 5 min   How long can you walk comfortably? 5 min   Patient Stated Goals improve mobility, stand long enough to go fly fishing   Currently in Pain? Yes   Pain Score 3    Pain Location Knee   Pain Orientation Left   Pain Descriptors / Indicators Aching   Pain Type Surgical  pain   Pain Onset In the past 7 days   Pain Frequency Intermittent   Aggravating Factors  bending, over exertion   Pain Relieving Factors rest, medication            [facility] PT Assessment - 06/03/16 1025    Assessment   Medical Diagnosis L TKA   Referring Provider Dr. Gaynelle Arabian   Onset Date/Surgical Date 05/30/16   Next MD Visit 06/09/16   Prior Therapy in hospital only   Precautions   Precautions None   Restrictions   Weight Bearing Restrictions Yes   LLE Weight Bearing Weight bearing as tolerated   Balance Screen   Has the patient fallen in the past 6 months Yes   How many times? 1   Has the patient had a decrease in activity level because of a fear of falling?  No   Is the patient reluctant to leave their home because of a fear of falling?  No   Home Ecologist residence   Living Arrangements Spouse/significant other   Available Help at Discharge Family   Type of [place] to enter   Entrance Stairs-Number of Steps 1   Entrance Stairs-Rails None   Home Layout One level   [place] - 2 wheels   Prior Function   Level of Independence Requires assistive device for independence   Leisure amb with SPC and bil AFOs; enjoys fly fishing   Observation/Other Assessments   Focus on Therapeutic Outcomes (FOTO)  28 (72% limited; predicted 51% limited)   AROM   AROM Assessment Site Knee   Right/Left Knee Right;Left   Right Knee Extension 0   Right Knee Flexion 104   Left Knee Extension 9   Left Knee Flexion 67   PROM   PROM Assessment Site Knee   Right/Left Knee Left   Left Knee Extension 4   Left Knee Flexion 85   Strength   Overall Strength Comments tested in sitting; L hip mostly limited due to pain   Strength Assessment Site Hip;Knee;Ankle   Right Hip Flexion 5/5   Right Hip ABduction 4/5   Right Hip ADduction 4/5   Left Hip Flexion 3/5   Left Hip ABduction 3+/5   Left Hip ADduction 3+/5    Right/Left Knee Right;Left   Right Knee Flexion 5/5   Right Knee  Extension 5/5   Left Knee Flexion 3-/5   Left Knee Extension 3-/5   Right Ankle Dorsiflexion 3/5   Left Ankle Dorsiflexion 3/5   Palpation   Patella mobility L knee diminished due to swelling and post op status   Palpation comment swelling and edema in L knee and calf; not measured as pt still with bandages   Transfers   Transfers Sit to Stand;Stand to Sit   Sit to Stand 5: Supervision;With upper extremity assist   Sit to Stand Details (indicate cue type and reason) heavy reliance on UEs   Stand to Sit 5: Supervision;With upper extremity assist   Stand to Sit Details heavy reliance on UEs   Ambulation/Gait   Ambulation/Gait Yes   Ambulation/Gait Assistance 5: Supervision   Ambulation Distance (Feet) 100 Feet   Assistive device Rolling walker   Gait Pattern Step-to pattern;Decreased dorsiflexion - right;Decreased dorsiflexion - left;Poor foot clearance - left;Poor foot clearance - right;Decreased stance time - left;Decreased step length - right;Decreased hip/knee flexion - left;Antalgic   Gait velocity 0.44 ft/sec  47.56 sec                   [facility] Adult PT Treatment/Exercise - 06/03/16 1025    Exercises   Exercises Knee/Hip   Knee/Hip Exercises: Supine   Quad Sets Left;5 reps   Short Arc Quad Sets Left;5 reps   Heel Slides AAROM;Left;5 reps   Heel Slides Limitations educated on use of sheet or strap to help imcrease ROM   Straight Leg Raises Left;5 reps   Straight Leg Raises Limitations ~ 15-20 degree extensor lag present   Modalities   Modalities Vasopneumatic   Vasopneumatic   Number Minutes Vasopneumatic  10 minutes   Vasopnuematic Location  Knee   Vasopneumatic Pressure Medium   Vasopneumatic Temperature  max cold   Manual Therapy   Manual Therapy Passive ROM   Passive ROM L knee flexion/extension                PT Education - 06/03/16 1109    Education provided Yes   Education  Details HEP   Person(s) Educated Patient;Spouse   Methods Explanation;Demonstration;Handout   Comprehension Verbalized understanding;Returned demonstration;Need further instruction          PT Short Term Goals - 06/03/16 1115    PT SHORT TERM GOAL #1   Title independent with initial HEP (07/01/16)   Time 4   Period Weeks   Status New   PT SHORT TERM GOAL #2   Title improve L knee AROM 4-95 for improved function and mobilty (07/01/16)   Time 4   Period Weeks   Status New   PT SHORT TERM GOAL #3   Title improve gait velocity to > 1.0 ft/sec for improved function and mobility (07/01/16)   Time 4   Period Weeks   Status New   PT SHORT TERM GOAL #4   Title ambulate > 150' with LRAD modified independent for improved mobility (07/01/16)   Time 4   Period Weeks   Status New           PT Long Term Goals - 06/03/16 1117    PT LONG TERM GOAL #1   Title independent with advanced HEP (07/29/16)   Time 8   Period Weeks   Status New   PT LONG TERM GOAL #2   Title improve L knee AROM 0-105 for  improved function and mobility (07/29/16)   Time 8   Period Weeks  Status New   PT LONG TERM GOAL #3   Title improve gait velocity to > 1.8 ft/sec for decreased fall risk and improved mobility (07/29/16)   Time 8   Period Weeks   Status New   PT LONG TERM GOAL #4   Title amb > 350' with LRAD on various indoor/outdoor surfaces for improved mobility and ability to go fly fishing (07/29/16)   Time 8   Period Weeks   Status New   PT LONG TERM GOAL #5   Title negotiate single step modified independently with SPC for improved mobility and function (07/29/16)   Time 8   Period Weeks   Status New               Plan - 06/03/16 1111    Clinical Impression Statement Pt is a 80 y/o male who presents to OPPT for moderate complexity PT evaluation s/p L TKA.  Pt demonstrates pain, decreased strength and ROM as well as gait abnormalities affecting safe functional mobility.  Pt will benefit  from PT to address deficits listed.  Pt with hx of neuropathy and balance deficits which may affect progress.   Rehab Potential Good   PT Frequency 2x / week   PT Duration 8 weeks   PT Treatment/Interventions ADLs/Self Care Home Management;Cryotherapy;Electrical Stimulation;Patient/family education;Neuromuscular re-education;Balance training;Moist Heat;Therapeutic exercise;Therapeutic activities;Functional mobility training;Stair training;Gait training;DME Instruction;Orthotic Fit/Training;Scar mobilization;Passive range of motion;Manual techniques;Vasopneumatic Device   PT Next Visit Plan review HEP; aggressive ROM, vaso; work on quad strength and decreasing extensor lag   Consulted and Agree with Plan of Care Patient      Patient will benefit from skilled therapeutic intervention in order to improve the following deficits and impairments:  Abnormal gait, Decreased range of motion, Decreased strength, Pain, Difficulty walking, Decreased activity tolerance, Decreased mobility, Decreased balance  Visit Diagnosis: Pain in left knee - Plan: PT plan of care cert/re-cert  Stiffness of left knee, not elsewhere classified - Plan: PT plan of care cert/re-cert  Other abnormalities of gait and mobility - Plan: PT plan of care cert/re-cert      G-Codes - 0000000 1120    Functional Assessment Tool Used FOTO 72% limited   Functional Limitation Mobility: Walking and moving around   Mobility: Walking and Moving Around Current Status [phone]) At least 60 percent but less than 80 percent impaired, limited or restricted   Mobility: Walking and Moving Around Goal Status [phone]) At least 40 percent but less than 60 percent impaired, limited or restricted       Problem List Patient Active Problem List   Diagnosis Date Noted  . Bladder outlet obstruction 05/02/2016  . OA (osteoarthritis) of knee 05/27/2015  . Diabetes mellitus type II, controlled ([place]) 11/26/2014  . Gastroesophageal reflux disease  without esophagitis 11/26/2014  . Arthritis of both knees 11/26/2014  . Absence of bladder continence 11/26/2014  . Hyperlipidemia LDL goal <100 11/26/2014  . Acute medial meniscal tear 07/16/2014  . Lumbosacral spondylosis without myelopathy 02/17/2014  . Spondylosis, lumbosacral 02/17/2014   Laureen Abrahams, PT, DPT 06/03/2016 11:23 AM  [facility] [address]  [place], [place], [address] Phone: [phone]   Fax:  [phone]  Name: Matthew Nash MRN: [id number] Date of Birth: 06-25-1936

## 2016-06-06 ENCOUNTER — Ambulatory Visit: Payer: Medicare Other | Admitting: Physical Therapy

## 2016-06-06 DIAGNOSIS — R269 Unspecified abnormalities of gait and mobility: Secondary | ICD-10-CM | POA: Diagnosis not present

## 2016-06-06 DIAGNOSIS — R2689 Other abnormalities of gait and mobility: Secondary | ICD-10-CM | POA: Diagnosis not present

## 2016-06-06 DIAGNOSIS — M25662 Stiffness of left knee, not elsewhere classified: Secondary | ICD-10-CM | POA: Diagnosis not present

## 2016-06-06 DIAGNOSIS — M25562 Pain in left knee: Secondary | ICD-10-CM | POA: Diagnosis not present

## 2016-06-06 NOTE — Therapy (Signed)
[facility] [address]  [place], [place], [address] Phone: [phone]   Fax:  [phone]  Physical Therapy Treatment  Patient Details  Name: Matthew Nash MRN: [id number] Date of Birth: 06-02-1936 Referring Provider: Dr. Gaynelle Arabian  Encounter Date: 06/06/2016      PT End of Session - 06/06/16 1114    Visit Number 2   Number of Visits 16   Date for PT Re-Evaluation 07/29/16   Authorization Type [employer] - no PTA   PT Start Time 1025   PT Stop Time 1121   PT Time Calculation (min) 56 min   Equipment Utilized During Treatment Gait belt   Activity Tolerance Patient tolerated treatment well   Behavior During Therapy [facility] for tasks assessed/performed      Past Medical History  Diagnosis Date  . BPH (benign prostatic hyperplasia)     hx s/p turp  . Hyperlipidemia     takes Simvastatin and Tricor daily-under control  . Joint pain     both knees  . Joint swelling     rt knee  . Chronic back pain     spondylosis  . Melanoma ([place])     right hand;basal cell carcinoma  . H/O hiatal hernia   . History of colon polyps   . Diverticulitis   . Neuropathy ([place])   . Prostate infection     currently taking macrobid  . Diarrhea     x 1 today  . History of chicken pox   . Urinary incontinence   . UTI (lower urinary tract infection)   . Back pain at L4-L5 level   . Arthritis     both knees  . DDD (degenerative disc disease), lumbar   . History of kidney stones   . Diabetes mellitus     takes Metformin and Victoza daily-under control  . Hepatitis     B-with Mono and jaundice 1960  . GERD (gastroesophageal reflux disease)     takes Omeprazole daily-under control  . Hypertension     takes Lisinopril daily-under control  . H/O measles   . Pneumonia     hx of  . Tinnitus   . Peripheral vascular disease ([place])     diabetic neuropathy  . Chronic kidney disease     kidney stones 1979    Past Surgical  History  Procedure Laterality Date  . Shoulder arthroscopy w/ rotator cuff repair Left NOV 2010  . Transurethral resection of prostate  2006  . Cortisone injection    . I&d left knee Left 1958    states 22 times  . C5 and t1 bone chip removed   1986  . Cholecystectomy  1993  . Right shoulder surgery  2012    rotator cuff repair  . Colonoscopy  2011  . Lumbar laminectomy/decompression microdiscectomy Right 02/17/2014    Procedure: RIGHT LUMBAR TWO-THREE LAMINECTOMY;  Surgeon: Charlie Pitter, MD;  Location: [facility];  Service: Neurosurgery;  Laterality: Right;  right   . Knee arthroscopy Right 07/16/2014    Procedure: RIGHT ARTHROSCOPY KNEE WITH Medial and Lateral DEBRIDEMENT and chondroplasty;  Surgeon: Gearlean Alf, MD;  Location: [facility];  Service: Orthopedics;  Laterality: Right;  . Wisdom tooth extraction    . Basal cell carcinoma excision    . Vasectomy  1983  . Tonsillectomy  1942  . Cauterize inner nose  1957  . Total knee arthroplasty Right 05/27/2015    Procedure:  RIGHT TOTAL KNEE ARTHROPLASTY;  Surgeon: Gaynelle Arabian, MD;  Location: [facility];  Service: Orthopedics;  Laterality: Right;  . Total knee arthroplasty Left 05/30/2016    Procedure: LEFT TOTAL KNEE ARTHROPLASTY;  Surgeon: Gaynelle Arabian, MD;  Location: [facility];  Service: Orthopedics;  Laterality: Left;    There were no vitals filed for this visit.      Subjective Assessment - 06/06/16 1031    Subjective didn't do much over the weekend; states he can't do the exercises when his braces are on but doesn't want to take them off.   Patient Stated Goals improve mobility, stand long enough to go fly fishing   Currently in Pain? Yes   Pain Score 2    Pain Location Knee   Pain Orientation Left   Pain Descriptors / Indicators Aching   Pain Type Surgical pain   Pain Onset 1 to 4 weeks ago   Pain Frequency Intermittent   Aggravating Factors  bending; over exertion   Pain Relieving Factors rest, medication             [facility] PT Assessment - 06/06/16 1055    AROM   Left Knee Flexion 92   PROM   Left Knee Flexion 98                     [facility] Adult PT Treatment/Exercise - 06/06/16 1033    Ambulation/Gait   Ambulation/Gait Assistance 4: Min assist   Ambulation Distance (Feet) 100 Feet   Assistive device Straight cane   Gait Pattern Step-to pattern;Decreased dorsiflexion - right;Decreased dorsiflexion - left;Poor foot clearance - left;Poor foot clearance - right;Decreased stance time - left;Decreased step length - right;Decreased hip/knee flexion - left;Antalgic   Gait Comments min cues needed with SPC with decreased terminal knee extension   Knee/Hip Exercises: Aerobic   Nustep L 6 x 8 min   Knee/Hip Exercises: Seated   Long Arc Quad Left;10 reps   Knee/Hip Exercises: Supine   Quad Sets Left;10 reps   Modalities   Modalities Vasopneumatic   Vasopneumatic   Number Minutes Vasopneumatic  15 minutes   Vasopnuematic Location  Knee   Vasopneumatic Pressure Medium   Vasopneumatic Temperature  max cold   Manual Therapy   Manual Therapy Passive ROM   Passive ROM L knee flexion and extension; seated flexion with contract relax                  PT Short Term Goals - 06/03/16 1115    PT SHORT TERM GOAL #1   Title independent with initial HEP (07/01/16)   Time 4   Period Weeks   Status New   PT SHORT TERM GOAL #2   Title improve L knee AROM 4-95 for improved function and mobilty (07/01/16)   Time 4   Period Weeks   Status New   PT SHORT TERM GOAL #3   Title improve gait velocity to > 1.0 ft/sec for improved function and mobility (07/01/16)   Time 4   Period Weeks   Status New   PT SHORT TERM GOAL #4   Title ambulate > 150' with LRAD modified independent for improved mobility (07/01/16)   Time 4   Period Weeks   Status New           PT Long Term Goals - 06/03/16 1117    PT LONG TERM GOAL #1   Title independent with advanced HEP (07/29/16)   Time 8   Period Weeks  Status New   PT LONG TERM GOAL #2   Title improve L knee AROM 0-105 for  improved function and mobility (07/29/16)   Time 8   Period Weeks   Status New   PT LONG TERM GOAL #3   Title improve gait velocity to > 1.8 ft/sec for decreased fall risk and improved mobility (07/29/16)   Time 8   Period Weeks   Status New   PT LONG TERM GOAL #4   Title amb > 350' with LRAD on various indoor/outdoor surfaces for improved mobility and ability to go fly fishing (07/29/16)   Time 8   Period Weeks   Status New   PT LONG TERM GOAL #5   Title negotiate single step modified independently with SPC for improved mobility and function (07/29/16)   Time 8   Period Weeks   Status New               Plan - 06/06/16 1114    Clinical Impression Statement Pt has poor tolerance to supine activities due to neuropathy and pain in heel.  ROM improved today and initiated trial of gait with SPC; only one LOB needing min A to correct.  Will continue to benefit from PT to maximize function.   PT Next Visit Plan review HEP; aggressive ROM, vaso; work on quad strength and decreasing extensor lag; reinforce need for compliance with HEP      Patient will benefit from skilled therapeutic intervention in order to improve the following deficits and impairments:     Visit Diagnosis: Pain in left knee  Stiffness of left knee, not elsewhere classified  Other abnormalities of gait and mobility     Problem List Patient Active Problem List   Diagnosis Date Noted  . Bladder outlet obstruction 05/02/2016  . OA (osteoarthritis) of knee 05/27/2015  . Diabetes mellitus type II, controlled ([place]) 11/26/2014  . Gastroesophageal reflux disease without esophagitis 11/26/2014  . Arthritis of both knees 11/26/2014  . Absence of bladder continence 11/26/2014  . Hyperlipidemia LDL goal <100 11/26/2014  . Acute medial meniscal tear 07/16/2014  . Lumbosacral spondylosis without myelopathy 02/17/2014  . Spondylosis,  lumbosacral 02/17/2014   Laureen Abrahams, PT, DPT 06/06/2016 11:52 AM  [facility] [address]  [place], [place], [address] Phone: [phone]   Fax:  [phone]  Name: AHAAN PAREKH MRN: [id number] Date of Birth: 10-May-1936

## 2016-06-08 ENCOUNTER — Encounter: Payer: Self-pay | Admitting: Family Medicine

## 2016-06-08 ENCOUNTER — Ambulatory Visit: Payer: Medicare Other | Admitting: Physical Therapy

## 2016-06-08 ENCOUNTER — Ambulatory Visit (INDEPENDENT_AMBULATORY_CARE_PROVIDER_SITE_OTHER): Payer: Medicare Other | Admitting: Family Medicine

## 2016-06-08 VITALS — BP 126/54 | HR 92 | Temp 98.1°F | Ht 71.0 in | Wt 220.6 lb

## 2016-06-08 DIAGNOSIS — R2689 Other abnormalities of gait and mobility: Secondary | ICD-10-CM | POA: Diagnosis not present

## 2016-06-08 DIAGNOSIS — M25562 Pain in left knee: Secondary | ICD-10-CM | POA: Diagnosis not present

## 2016-06-08 DIAGNOSIS — M25662 Stiffness of left knee, not elsewhere classified: Secondary | ICD-10-CM | POA: Diagnosis not present

## 2016-06-08 DIAGNOSIS — Z789 Other specified health status: Secondary | ICD-10-CM | POA: Diagnosis not present

## 2016-06-08 DIAGNOSIS — N3944 Nocturnal enuresis: Secondary | ICD-10-CM | POA: Diagnosis not present

## 2016-06-08 DIAGNOSIS — R269 Unspecified abnormalities of gait and mobility: Secondary | ICD-10-CM

## 2016-06-08 DIAGNOSIS — N3945 Continuous leakage: Secondary | ICD-10-CM | POA: Diagnosis not present

## 2016-06-08 NOTE — Patient Instructions (Signed)
Please increase your DDAVP to 2 pills a day.  Let me know how this does for you.  We can try increasing your dose further if needed or stick with 0.2 mg a day We can go up further on your DDAVP if needed up to 0.6

## 2016-06-08 NOTE — Progress Notes (Signed)
Pre visit review using our clinic review tool, if applicable. No additional management support is needed unless otherwise documented below in the visit note. 

## 2016-06-08 NOTE — Progress Notes (Signed)
[facility] [address] [place], [place] [address] [phone]  Date:  06/08/2016   Name:  Matthew Nash   DOB:  07/07/1936   MRN:  [id number]  PCP:  Lamar Blinks, MD    Chief Complaint: Urinary Incontinence   History of Present Illness:  Matthew Nash is a 80 y.o. very pleasant male patient who presents with the following:  Would like to discuss his bladder issues.   In 2005 he noted that he had frequent urination.  He started seeing Dr. Gaynelle Arabian, was dx with enlarged prostate.  He had a TURP procedure in 2005.  He did not have any prostate cancer.  unfortunately his operation did not seem to improve his condition.   He then started seeing Dr. Venia Minks at [facility] urology once or twice.  He noted incontinence symptoms following his surgery and also frequent voiding.   Continued to have symptoms that got worse and worse.   He then saw another urologist Dr. Bertram Savin, started on a medication but he does not recall what it was or it helped.  During this time he developed consistent nocturnal enuresis and would wet the bed at least once a night.  This has been distressing to him and to his wife.  Continued in same state until about 18 months ago [facility] went [facility] Dr. Gaynelle Arabian. He again has an enlarged prostate.  They started flomax but this seemed to make his nighttime leakage worse. He would go though a full depends, also has to sleep on a pad. They were changing the sheets daily.    He started on DDAVP about 3 months ago, he also does a self cath prior to going to bed (he was noted to have excess residual urine in the bladder after voiding). Also a month ago he had a UTI dx by urology- this was treated.  A repeat culture was negative He does feel like the DDAVP is helping him at least some.  He has not noted any adverse effects  He did have left knee surgery on 6/19, is recovering normally  He has noted some  cough and congestion for the last few days.  No fever, does not feel ill but would like me to check his lungs   Patient Active Problem List   Diagnosis Date Noted  . Bladder outlet obstruction 05/02/2016  . OA (osteoarthritis) of knee 05/27/2015  . Diabetes mellitus type II, controlled ([place]) 11/26/2014  . Gastroesophageal reflux disease without esophagitis 11/26/2014  . Arthritis of both knees 11/26/2014  . Absence of bladder continence 11/26/2014  . Hyperlipidemia LDL goal <100 11/26/2014  . Acute medial meniscal tear 07/16/2014  . Lumbosacral spondylosis without myelopathy 02/17/2014  . Spondylosis, lumbosacral 02/17/2014    Past Medical History  Diagnosis Date  . BPH (benign prostatic hyperplasia)     hx s/p turp  . Hyperlipidemia     takes Simvastatin and Tricor daily-under control  . Joint pain     both knees  . Joint swelling     rt knee  . Chronic back pain     spondylosis  . Melanoma ([place])     right hand;basal cell carcinoma  . H/O hiatal hernia   . History of colon polyps   . Diverticulitis   . Neuropathy ([place])   . Prostate infection     currently taking macrobid  . Diarrhea     x 1 today  .  History of chicken pox   . Urinary incontinence   . UTI (lower urinary tract infection)   . Back pain at L4-L5 level   . Arthritis     both knees  . DDD (degenerative disc disease), lumbar   . History of kidney stones   . Diabetes mellitus     takes Metformin and Victoza daily-under control  . Hepatitis     B-with Mono and jaundice 1960  . GERD (gastroesophageal reflux disease)     takes Omeprazole daily-under control  . Hypertension     takes Lisinopril daily-under control  . H/O measles   . Pneumonia     hx of  . Tinnitus   . Peripheral vascular disease ([place])     diabetic neuropathy  . Chronic kidney disease     kidney stones 1979    Past Surgical History  Procedure Laterality Date  . Shoulder arthroscopy w/ rotator cuff repair Left NOV 2010  .  Transurethral resection of prostate  2006  . Cortisone injection    . I&d left knee Left 1958    states 22 times  . C5 and t1 bone chip removed   1986  . Cholecystectomy  1993  . Right shoulder surgery  2012    rotator cuff repair  . Colonoscopy  2011  . Lumbar laminectomy/decompression microdiscectomy Right 02/17/2014    Procedure: RIGHT LUMBAR TWO-THREE LAMINECTOMY;  Surgeon: Charlie Pitter, MD;  Location: [facility];  Service: Neurosurgery;  Laterality: Right;  right   . Knee arthroscopy Right 07/16/2014    Procedure: RIGHT ARTHROSCOPY KNEE WITH Medial and Lateral DEBRIDEMENT and chondroplasty;  Surgeon: Gearlean Alf, MD;  Location: [facility];  Service: Orthopedics;  Laterality: Right;  . Wisdom tooth extraction    . Basal cell carcinoma excision    . Vasectomy  1983  . Tonsillectomy  1942  . Cauterize inner nose  1957  . Total knee arthroplasty Right 05/27/2015    Procedure: RIGHT TOTAL KNEE ARTHROPLASTY;  Surgeon: Gaynelle Arabian, MD;  Location: [facility];  Service: Orthopedics;  Laterality: Right;  . Total knee arthroplasty Left 05/30/2016    Procedure: LEFT TOTAL KNEE ARTHROPLASTY;  Surgeon: Gaynelle Arabian, MD;  Location: [facility];  Service: Orthopedics;  Laterality: Left;    Social History  Substance Use Topics  . Smoking status: Former Smoker    Types: Pipe    Quit date: 07/14/1978  . Smokeless tobacco: Never Used     Comment: quit smoking 1979  . Alcohol Use: Yes     Comment: OCCASIONAL    Family History  Problem Relation Age of Onset  . Heart disease Mother 23    Deceased  . Bladder Cancer Mother   . Prostate cancer Father     Deceased-in 12s  . Healthy Brother   . Skin cancer Brother     "basal cell cancer in the bloodstream"  . Asthma Sister     Deceased  . Healthy Son     #1  . Healthy Daughter     #2  . Obesity Son     #2    Allergies  Allergen Reactions  . Albamycin [Novobiocin] Other (See Comments)    Made me look like a strawberry   . Penicillins Rash  and Other (See Comments)    Has patient had a PCN reaction causing immediate rash, facial/tongue/throat swelling, SOB or lightheadedness with hypotension:  no Has patient had a PCN reaction causing severe rash involving mucus membranes  or skin necrosis: no Has patient had a PCN reaction that required hospitalization no Has patient had a PCN reaction occurring within the last 10 years: no - childhood reaction If all of the above answers are "NO", then may proceed with Cephalosporin use.     Medication list has been reviewed and updated.  Current Outpatient Prescriptions on File Prior to Visit  Medication Sig Dispense Refill  . desmopressin (DDAVP) 0.1 MG tablet Take 0.1 mg by mouth at bedtime.    . fenofibrate (TRICOR) 145 MG tablet Take 1 tablet (145 mg total) by mouth every evening. 90 tablet 1  . FIBER PO Take 3 capsules by mouth 2 (two) times daily.    [name] Kitchen glucose blood (FREESTYLE LITE) test strip Use as instructed to test Blood Glucose twice daily Dx: E11.9 200 each 3  . Insulin Pen Needle (CAREFINE PEN NEEDLES) 31G X 8 MM MISC Use once daily with Victoza injections. 100 each 3  . Lancets (FREESTYLE) lancets 1 each by Other route 2 (two) times daily. Use as instructed 200 each 3  . Liraglutide 18 MG/3ML SOPN Inject 0.3 mLs (1.8 mg total) into the skin daily. 27 mL 1  . lisinopril (PRINIVIL,ZESTRIL) 10 MG tablet Take 1 tablet (10 mg total) by mouth every evening. 90 tablet 1  . metFORMIN (GLUCOPHAGE-XR) 500 MG 24 hr tablet Take 1 tablet (500 mg total) by mouth 2 (two) times daily with a meal. 180 tablet 1  . omeprazole (PRILOSEC) 20 MG capsule Take 1 capsule (20 mg total) by mouth daily. 90 capsule 1  . Probiotic Product (PROBIOTIC PO) Take 1 capsule by mouth daily.    . rivaroxaban (XARELTO) 10 MG TABS tablet Take 1 tablet (10 mg total) by mouth daily with breakfast. 20 tablet 0  . simvastatin (ZOCOR) 10 MG tablet Take 1 tablet (10 mg total) by mouth at bedtime. 90 tablet 1  . tamsulosin  (FLOMAX) 0.4 MG CAPS capsule Take 0.4 mg by mouth daily.      No current facility-administered medications on file prior to visit.    Review of Systems:  As per HPI- otherwise negative.   Physical Examination: Filed Vitals:   06/08/16 1449  BP: 126/54  Pulse: 92  Temp: 98.1 F (36.7 C)   Filed Vitals:   06/08/16 1449  Height: 5\' 11"  (1.803 m)  Weight: 220 lb 9.6 oz (100.064 kg)   Body mass index is 30.78 kg/(m^2). Ideal Body Weight: Weight in (lb) to have BMI = 25: 178.9  GEN: WDWN, NAD, Non-toxic, A & O x 3, overweight, here with his wife today HEENT: Atraumatic, Normocephalic. Neck supple. No masses, No LAD. Ears and Nose: No external deformity. CV: RRR, No M/G/R. No JVD. No thrill. No extra heart sounds. PULM: CTA B, no wheezes, crackles, rhonchi. No retractions. No resp. distress. No accessory muscle use. EXTR: No c/c/e NEURO he is using a walker right now following his knee surgery PSYCH: Normally interactive. Conversant. Not depressed or anxious appearing.  Calm demeanor.    Assessment and Plan: Intermittent self-catheterization of bladder (HCC)  Continuous leakage of urine  Nocturnal enuresis here today to discuss bladder leakage and bedwetting for over 10 years.  He is really frustrated and this is worsening his quality of life.  Discussed in detail with pt and his wife.   DDAVP does seem to be improving his sx so we will increase his dose to 0.2 using the supply he has at home.  He will let me know  how this does for him and we can increase further if needed Offered support and encouragement.   Reassured that his lungs are clear, he will let me know if cough does not resolve   Signed Lamar Blinks, MD

## 2016-06-08 NOTE — Therapy (Signed)
[facility] [address]  [place], [place], [address] Phone: [phone]   Fax:  [phone]  Physical Therapy Treatment  Patient Details  Name: Matthew Nash MRN: [id number] Date of Birth: 1936-03-26 Referring Provider: Dr. Gaynelle Arabian  Encounter Date: 06/08/2016      PT End of Session - 06/08/16 1343    Visit Number 3   Number of Visits 16   Date for PT Re-Evaluation 07/29/16   Authorization Type [employer] - no PTA   PT Start Time 1300   PT Stop Time 1357   PT Time Calculation (min) 57 min   Equipment Utilized During Treatment Gait belt   Activity Tolerance Patient tolerated treatment well   Behavior During Therapy [facility] for tasks assessed/performed      Past Medical History  Diagnosis Date  . BPH (benign prostatic hyperplasia)     hx s/p turp  . Hyperlipidemia     takes Simvastatin and Tricor daily-under control  . Joint pain     both knees  . Joint swelling     rt knee  . Chronic back pain     spondylosis  . Melanoma ([place])     right hand;basal cell carcinoma  . H/O hiatal hernia   . History of colon polyps   . Diverticulitis   . Neuropathy ([place])   . Prostate infection     currently taking macrobid  . Diarrhea     x 1 today  . History of chicken pox   . Urinary incontinence   . UTI (lower urinary tract infection)   . Back pain at L4-L5 level   . Arthritis     both knees  . DDD (degenerative disc disease), lumbar   . History of kidney stones   . Diabetes mellitus     takes Metformin and Victoza daily-under control  . Hepatitis     B-with Mono and jaundice 1960  . GERD (gastroesophageal reflux disease)     takes Omeprazole daily-under control  . Hypertension     takes Lisinopril daily-under control  . H/O measles   . Pneumonia     hx of  . Tinnitus   . Peripheral vascular disease ([place])     diabetic neuropathy  . Chronic kidney disease     kidney stones 1979    Past Surgical  History  Procedure Laterality Date  . Shoulder arthroscopy w/ rotator cuff repair Left NOV 2010  . Transurethral resection of prostate  2006  . Cortisone injection    . I&d left knee Left 1958    states 22 times  . C5 and t1 bone chip removed   1986  . Cholecystectomy  1993  . Right shoulder surgery  2012    rotator cuff repair  . Colonoscopy  2011  . Lumbar laminectomy/decompression microdiscectomy Right 02/17/2014    Procedure: RIGHT LUMBAR TWO-THREE LAMINECTOMY;  Surgeon: Charlie Pitter, MD;  Location: [facility];  Service: Neurosurgery;  Laterality: Right;  right   . Knee arthroscopy Right 07/16/2014    Procedure: RIGHT ARTHROSCOPY KNEE WITH Medial and Lateral DEBRIDEMENT and chondroplasty;  Surgeon: Gearlean Alf, MD;  Location: [facility];  Service: Orthopedics;  Laterality: Right;  . Wisdom tooth extraction    . Basal cell carcinoma excision    . Vasectomy  1983  . Tonsillectomy  1942  . Cauterize inner nose  1957  . Total knee arthroplasty Right 05/27/2015    Procedure:  RIGHT TOTAL KNEE ARTHROPLASTY;  Surgeon: Gaynelle Arabian, MD;  Location: [facility];  Service: Orthopedics;  Laterality: Right;  . Total knee arthroplasty Left 05/30/2016    Procedure: LEFT TOTAL KNEE ARTHROPLASTY;  Surgeon: Gaynelle Arabian, MD;  [facility]: [facility];  Service: Orthopedics;  Laterality: Left;    There were no vitals filed for this visit.      Subjective Assessment - 06/08/16 1302    Subjective isn't doing exercises at home - only quad sets and ankle pumps   Patient Stated Goals improve mobility, stand long enough to go fly fishing   Currently in Pain? Yes   Pain Score 2    Pain Location Knee   Pain Orientation Left   Pain Descriptors / Indicators Aching   Pain Type Surgical pain   Pain Onset 1 to 4 weeks ago   Pain Frequency Intermittent   Aggravating Factors  bending; over exertion   Pain Relieving Factors rest, medication                         [facility] Adult PT Treatment/Exercise -  06/08/16 1306    Ambulation/Gait   Ambulation/Gait Yes   Ambulation/Gait Assistance 4: Min guard   Ambulation Distance (Feet) 180 Feet   Assistive device Straight cane   Gait Pattern Step-to pattern;Decreased dorsiflexion - right;Decreased dorsiflexion - left;Poor foot clearance - left;Poor foot clearance - right;Decreased stance time - left;Decreased step length - right;Decreased hip/knee flexion - left;Antalgic   Gait Comments one episode of L foot catching; pt able to self correct   Knee/Hip Exercises: Aerobic   Nustep L 6 x 8 min   Knee/Hip Exercises: Seated   Long Arc Quad Left;15 reps   Knee/Hip Exercises: Supine   Quad Sets Left;15 reps   Quad Sets Limitations performed prone   Short Arc [employer] Left;15 reps   Straight Leg Raises Left;15 reps   Straight Leg Raises Limitations mild extensor lag present   Knee/Hip Exercises: Sidelying   Hip ABduction Left;15 reps   Hip ABduction Limitations with cues for terminal knee extension and to decrease hip er   Knee/Hip Exercises: Prone   Hamstring Curl 15 reps   Hamstring Curl Limitations LLE   Hip Extension Left;15 reps   Modalities   Modalities Vasopneumatic   Vasopneumatic   Number Minutes Vasopneumatic  15 minutes   Vasopnuematic Location  Knee   Vasopneumatic Pressure Medium   Vasopneumatic Temperature  max cold   Manual Therapy   Manual Therapy Passive ROM   Passive ROM L knee flexion with contract/relax in prone                  PT Short Term Goals - 06/03/16 1115    PT SHORT TERM GOAL #1   Title independent with initial HEP (07/01/16)   Time 4   Period Weeks   Status New   PT SHORT TERM GOAL #2   Title improve L knee AROM 4-95 for improved function and mobilty (07/01/16)   Time 4   Period Weeks   Status New   PT SHORT TERM GOAL #3   Title improve gait velocity to > 1.0 ft/sec for improved function and mobility (07/01/16)   Time 4   Period Weeks   Status New   PT SHORT TERM GOAL #4   Title ambulate  > 150' with LRAD modified independent for improved mobility (07/01/16)   Time 4   Period Weeks   Status New  PT Long Term Goals - 06/03/16 1117    PT LONG TERM GOAL #1   Title independent with advanced HEP (07/29/16)   Time 8   Period Weeks   Status New   PT LONG TERM GOAL #2   Title improve L knee AROM 0-105 for  improved function and mobility (07/29/16)   Time 8   Period Weeks   Status New   PT LONG TERM GOAL #3   Title improve gait velocity to > 1.8 ft/sec for decreased fall risk and improved mobility (07/29/16)   Time 8   Period Weeks   Status New   PT LONG TERM GOAL #4   Title amb > 350' with LRAD on various indoor/outdoor surfaces for improved mobility and ability to go fly fishing (07/29/16)   Time 8   Period Weeks   Status New   PT LONG TERM GOAL #5   Title negotiate single step modified independently with SPC for improved mobility and function (07/29/16)   Time 8   Period Weeks   Status New               Plan - 06/08/16 1343    Clinical Impression Statement Pt tolerated exercise well today; limited compliance with HEP.  Educated and reinforced need to be compliant with HEP to maximize rehab benefit.  Will continue to benefit from PT to maximize function.   PT Next Visit Plan review HEP; aggressive ROM, vaso; work on quad strength and decreasing extensor lag; reinforce need for compliance with HEP      Patient will benefit from skilled therapeutic intervention in order to improve the following deficits and impairments:  Abnormal gait, Decreased range of motion, Decreased strength, Pain, Difficulty walking, Decreased activity tolerance, Decreased mobility, Decreased balance  Visit Diagnosis: Pain in left knee  Stiffness of left knee, not elsewhere classified  Other abnormalities of gait and mobility  Abnormality of gait     Problem List Patient Active Problem List   Diagnosis Date Noted  . Bladder outlet obstruction 05/02/2016  . OA  (osteoarthritis) of knee 05/27/2015  . Diabetes mellitus type II, controlled ([place]) 11/26/2014  . Gastroesophageal reflux disease without esophagitis 11/26/2014  . Arthritis of both knees 11/26/2014  . Absence of bladder continence 11/26/2014  . Hyperlipidemia LDL goal <100 11/26/2014  . Acute medial meniscal tear 07/16/2014  . Lumbosacral spondylosis without myelopathy 02/17/2014  . Spondylosis, lumbosacral 02/17/2014   Laureen Abrahams, PT, DPT 06/08/2016 2:25 PM  [facility] [address]  [address] [place], [place], [address] Phone: [phone]   Fax:  [phone]  Name: Matthew Nash MRN: [id number] Date of Birth: [birth year]

## 2016-06-09 DIAGNOSIS — Z96652 Presence of left artificial knee joint: Secondary | ICD-10-CM | POA: Diagnosis not present

## 2016-06-09 DIAGNOSIS — Z471 Aftercare following joint replacement surgery: Secondary | ICD-10-CM | POA: Diagnosis not present

## 2016-06-13 ENCOUNTER — Emergency Department (HOSPITAL_BASED_OUTPATIENT_CLINIC_OR_DEPARTMENT_OTHER): Payer: Medicare Other

## 2016-06-13 ENCOUNTER — Ambulatory Visit: Payer: Medicare Other | Attending: Orthopedic Surgery | Admitting: Physical Therapy

## 2016-06-13 ENCOUNTER — Encounter (HOSPITAL_BASED_OUTPATIENT_CLINIC_OR_DEPARTMENT_OTHER): Payer: Self-pay | Admitting: *Deleted

## 2016-06-13 ENCOUNTER — Emergency Department (HOSPITAL_BASED_OUTPATIENT_CLINIC_OR_DEPARTMENT_OTHER)
Admission: EM | Admit: 2016-06-13 | Discharge: 2016-06-13 | Disposition: A | Discharge status: Home / Self Care | Payer: Medicare Other | Attending: Emergency Medicine | Admitting: Emergency Medicine

## 2016-06-13 DIAGNOSIS — R2689 Other abnormalities of gait and mobility: Secondary | ICD-10-CM | POA: Diagnosis not present

## 2016-06-13 DIAGNOSIS — M25662 Stiffness of left knee, not elsewhere classified: Secondary | ICD-10-CM | POA: Diagnosis not present

## 2016-06-13 DIAGNOSIS — M1990 Unspecified osteoarthritis, unspecified site: Secondary | ICD-10-CM | POA: Insufficient documentation

## 2016-06-13 DIAGNOSIS — N189 Chronic kidney disease, unspecified: Secondary | ICD-10-CM | POA: Diagnosis not present

## 2016-06-13 DIAGNOSIS — Z7984 Long term (current) use of oral hypoglycemic drugs: Secondary | ICD-10-CM | POA: Diagnosis not present

## 2016-06-13 DIAGNOSIS — Z794 Long term (current) use of insulin: Secondary | ICD-10-CM | POA: Diagnosis not present

## 2016-06-13 DIAGNOSIS — I129 Hypertensive chronic kidney disease with stage 1 through stage 4 chronic kidney disease, or unspecified chronic kidney disease: Secondary | ICD-10-CM | POA: Insufficient documentation

## 2016-06-13 DIAGNOSIS — N390 Urinary tract infection, site not specified: Secondary | ICD-10-CM

## 2016-06-13 DIAGNOSIS — Z79899 Other long term (current) drug therapy: Secondary | ICD-10-CM | POA: Insufficient documentation

## 2016-06-13 DIAGNOSIS — E785 Hyperlipidemia, unspecified: Secondary | ICD-10-CM | POA: Insufficient documentation

## 2016-06-13 DIAGNOSIS — R000 Tachycardia, unspecified: Secondary | ICD-10-CM | POA: Insufficient documentation

## 2016-06-13 DIAGNOSIS — R509 Fever, unspecified: Secondary | ICD-10-CM | POA: Diagnosis not present

## 2016-06-13 DIAGNOSIS — E1151 Type 2 diabetes mellitus with diabetic peripheral angiopathy without gangrene: Secondary | ICD-10-CM | POA: Insufficient documentation

## 2016-06-13 DIAGNOSIS — M25562 Pain in left knee: Secondary | ICD-10-CM | POA: Diagnosis not present

## 2016-06-13 DIAGNOSIS — Z87891 Personal history of nicotine dependence: Secondary | ICD-10-CM | POA: Diagnosis not present

## 2016-06-13 DIAGNOSIS — E1122 Type 2 diabetes mellitus with diabetic chronic kidney disease: Secondary | ICD-10-CM | POA: Diagnosis not present

## 2016-06-13 LAB — URINALYSIS, ROUTINE W REFLEX MICROSCOPIC
BILIRUBIN URINE: NEGATIVE
GLUCOSE, UA: NEGATIVE mg/dL
HGB URINE DIPSTICK: NEGATIVE
Ketones, ur: NEGATIVE mg/dL
Nitrite: POSITIVE — AB
Protein, ur: NEGATIVE mg/dL
SPECIFIC GRAVITY, URINE: 1.02 (ref 1.005–1.030)
pH: 7.5 (ref 5.0–8.0)

## 2016-06-13 LAB — CBC WITH DIFFERENTIAL/PLATELET
Basophils Absolute: 0 10*3/uL (ref 0.0–0.1)
Basophils Relative: 0 %
EOS ABS: 0.1 10*3/uL (ref 0.0–0.7)
EOS PCT: 0 %
HCT: 34 % — ABNORMAL LOW (ref 39.0–52.0)
Hemoglobin: 10.9 g/dL — ABNORMAL LOW (ref 13.0–17.0)
LYMPHS ABS: 1.3 10*3/uL (ref 0.7–4.0)
LYMPHS PCT: 10 %
MCH: 28.2 pg (ref 26.0–34.0)
MCHC: 32.1 g/dL (ref 30.0–36.0)
MCV: 88.1 fL (ref 78.0–100.0)
MONO ABS: 1.1 10*3/uL — AB (ref 0.1–1.0)
MONOS PCT: 8 %
Neutro Abs: 10.8 10*3/uL — ABNORMAL HIGH (ref 1.7–7.7)
Neutrophils Relative %: 82 %
PLATELETS: 411 10*3/uL — AB (ref 150–400)
RBC: 3.86 MIL/uL — ABNORMAL LOW (ref 4.22–5.81)
RDW: 13.3 % (ref 11.5–15.5)
WBC: 13.2 10*3/uL — AB (ref 4.0–10.5)

## 2016-06-13 LAB — COMPREHENSIVE METABOLIC PANEL
ALT: 9 U/L — ABNORMAL LOW (ref 17–63)
ANION GAP: 6 (ref 5–15)
AST: 9 U/L — ABNORMAL LOW (ref 15–41)
Albumin: 3.4 g/dL — ABNORMAL LOW (ref 3.5–5.0)
Alkaline Phosphatase: 49 U/L (ref 38–126)
BUN: 14 mg/dL (ref 6–20)
CHLORIDE: 107 mmol/L (ref 101–111)
CO2: 25 mmol/L (ref 22–32)
Calcium: 9.3 mg/dL (ref 8.9–10.3)
Creatinine, Ser: 1.02 mg/dL (ref 0.61–1.24)
Glucose, Bld: 116 mg/dL — ABNORMAL HIGH (ref 65–99)
Potassium: 3.8 mmol/L (ref 3.5–5.1)
SODIUM: 138 mmol/L (ref 135–145)
Total Bilirubin: 0.7 mg/dL (ref 0.3–1.2)
Total Protein: 7 g/dL (ref 6.5–8.1)

## 2016-06-13 LAB — URINE MICROSCOPIC-ADD ON

## 2016-06-13 MED ORDER — CEPHALEXIN 500 MG PO CAPS: 500.0000 mg | ORAL_CAPSULE | Freq: Four times a day (QID) | ORAL | Status: DC

## 2016-06-13 MED ORDER — CEPHALEXIN 250 MG PO CAPS
500.0000 mg | ORAL_CAPSULE | Freq: Once | ORAL | Status: AC
  Administered 2016-06-13: 500 mg via ORAL
  Filled 2016-06-13: qty 2

## 2016-06-13 MED ORDER — ACETAMINOPHEN 325 MG PO TABS
650.0000 mg | ORAL_TABLET | Freq: Once | ORAL | Status: AC
  Administered 2016-06-13: 650 mg via ORAL
  Filled 2016-06-13: qty 2

## 2016-06-13 NOTE — Therapy (Signed)
[facility] [address]  [place], [place], [address] Phone: [phone]   Fax:  [phone]  Physical Therapy Treatment  Patient Details  Name: Matthew Nash MRN: [id number] Date of Birth: Feb 16, 1936 Referring Provider: Dr. Gaynelle Arabian  Encounter Date: 06/13/2016      PT End of Session - 06/13/16 1425    Visit Number 4   Number of Visits 16   Date for PT Re-Evaluation 07/29/16   Authorization Type [employer] - no PTA   PT Start Time 1342   PT Stop Time 1427   PT Time Calculation (min) 45 min   Activity Tolerance Patient tolerated treatment well   Behavior During Therapy [facility] for tasks assessed/performed      Past Medical History  Diagnosis Date  . BPH (benign prostatic hyperplasia)     hx s/p turp  . Hyperlipidemia     takes Simvastatin and Tricor daily-under control  . Joint pain     both knees  . Joint swelling     rt knee  . Chronic back pain     spondylosis  . Melanoma ([place])     right hand;basal cell carcinoma  . H/O hiatal hernia   . History of colon polyps   . Diverticulitis   . Neuropathy ([place])   . Prostate infection     currently taking macrobid  . Diarrhea     x 1 today  . History of chicken pox   . Urinary incontinence   . UTI (lower urinary tract infection)   . Back pain at L4-L5 level   . Arthritis     both knees  . DDD (degenerative disc disease), lumbar   . History of kidney stones   . Diabetes mellitus     takes Metformin and Victoza daily-under control  . Hepatitis     B-with Mono and jaundice 1960  . GERD (gastroesophageal reflux disease)     takes Omeprazole daily-under control  . Hypertension     takes Lisinopril daily-under control  . H/O measles   . Pneumonia     hx of  . Tinnitus   . Peripheral vascular disease ([place])     diabetic neuropathy  . Chronic kidney disease     kidney stones 1979    Past Surgical History  Procedure Laterality Date  . Shoulder  arthroscopy w/ rotator cuff repair Left NOV 2010  . Transurethral resection of prostate  2006  . Cortisone injection    . I&d left knee Left 1958    states 22 times  . C5 and t1 bone chip removed   1986  . Cholecystectomy  1993  . Right shoulder surgery  2012    rotator cuff repair  . Colonoscopy  2011  . Lumbar laminectomy/decompression microdiscectomy Right 02/17/2014    Procedure: RIGHT LUMBAR TWO-THREE LAMINECTOMY;  Surgeon: Charlie Pitter, MD;  Location: [facility];  Service: Neurosurgery;  Laterality: Right;  right   . Knee arthroscopy Right 07/16/2014    Procedure: RIGHT ARTHROSCOPY KNEE WITH Medial and Lateral DEBRIDEMENT and chondroplasty;  Surgeon: Gearlean Alf, MD;  Location: [facility];  Service: Orthopedics;  Laterality: Right;  . Wisdom tooth extraction    . Basal cell carcinoma excision    . Vasectomy  1983  . Tonsillectomy  1942  . Cauterize inner nose  1957  . Total knee arthroplasty Right 05/27/2015    Procedure: RIGHT TOTAL KNEE ARTHROPLASTY;  Surgeon: Gaynelle Arabian,  MD;  Location: [facility];  Service: Orthopedics;  Laterality: Right;  . Total knee arthroplasty Left 05/30/2016    Procedure: LEFT TOTAL KNEE ARTHROPLASTY;  Surgeon: Gaynelle Arabian, MD;  Location: [facility];  Service: Orthopedics;  Laterality: Left;    There were no vitals filed for this visit.      Subjective Assessment - 06/13/16 1346    Subjective reports compliance with HEP today; not taking pain medications any more and head feel more clear.   Patient Stated Goals improve mobility, stand long enough to go fly fishing   Currently in Pain? No/denies                         [facility] Adult PT Treatment/Exercise - 06/13/16 1347    Knee/Hip Exercises: Aerobic   Nustep L 6 x 8 min   Knee/Hip Exercises: Machines for Strengthening   Cybex Knee Extension 25# 2x10 with focus on LLE doing most of work   Cybex Knee Flexion 25# 2x10   Knee/Hip Exercises: Standing   Hip Flexion Both;15 reps;Knee  bent;Knee straight   Hip Flexion Limitations 2#   Hip Abduction Both;15 reps;Knee straight   Abduction Limitations 2#   Hip Extension Both;15 reps;Knee straight   Extension Limitations 2#   Knee/Hip Exercises: Supine   Quad Sets Left;2 sets;10 reps   Quad Sets Limitations on peanut ball, and x 15 prone   Heel Slides AAROM;10 reps   Heel Slides Limitations with peanut ball and PT overpressure   Manual Therapy   Manual Therapy Passive ROM   Passive ROM L knee flexion with contract/relax in prone                  PT Short Term Goals - 06/03/16 1115    PT SHORT TERM GOAL #1   Title independent with initial HEP (07/01/16)   Time 4   Period Weeks   Status New   PT SHORT TERM GOAL #2   Title improve L knee AROM 4-95 for improved function and mobilty (07/01/16)   Time 4   Period Weeks   Status New   PT SHORT TERM GOAL #3   Title improve gait velocity to > 1.0 ft/sec for improved function and mobility (07/01/16)   Time 4   Period Weeks   Status New   PT SHORT TERM GOAL #4   Title ambulate > 150' with LRAD modified independent for improved mobility (07/01/16)   Time 4   Period Weeks   Status New           PT Long Term Goals - 06/03/16 1117    PT LONG TERM GOAL #1   Title independent with advanced HEP (07/29/16)   Time 8   Period Weeks   Status New   PT LONG TERM GOAL #2   Title improve L knee AROM 0-105 for  improved function and mobility (07/29/16)   Time 8   Period Weeks   Status New   PT LONG TERM GOAL #3   Title improve gait velocity to > 1.8 ft/sec for decreased fall risk and improved mobility (07/29/16)   Time 8   Period Weeks   Status New   PT LONG TERM GOAL #4   Title amb > 350' with LRAD on various indoor/outdoor surfaces for improved mobility and ability to go fly fishing (07/29/16)   Time 8   Period Weeks   Status New   PT LONG TERM GOAL #5  Title negotiate single step modified independently with SPC for improved mobility and function (07/29/16)    Time 8   Period Weeks   Status New               Plan - 06/13/16 1425    Clinical Impression Statement Pt arrives to PT using [facility] today stating "the doctor told me to use it."  Pt with LOB x 2 in clinic and recommended continued use of RW but pt reluctant to PT recommendations.  Otherwise MD pleased with progress.  Tolerated increased weight bearing and strengthening exercises today without increase in pain.   PT Next Visit Plan review HEP; aggressive ROM, vaso; work on quad strength and decreasing extensor lag; reinforce need for compliance with HEP   Consulted and Agree with Plan of Care Patient      Patient will benefit from skilled therapeutic intervention in order to improve the following deficits and impairments:  Abnormal gait, Decreased range of motion, Decreased strength, Pain, Difficulty walking, Decreased activity tolerance, Decreased mobility, Decreased balance  Visit Diagnosis: Pain in left knee  Stiffness of left knee, not elsewhere classified  Other abnormalities of gait and mobility     Problem List Patient Active Problem List   Diagnosis Date Noted  . Bladder outlet obstruction 05/02/2016  . OA (osteoarthritis) of knee 05/27/2015  . Diabetes mellitus type II, controlled ([place]) 11/26/2014  . Gastroesophageal reflux disease without esophagitis 11/26/2014  . Arthritis of both knees 11/26/2014  . Absence of bladder continence 11/26/2014  . Hyperlipidemia LDL goal <100 11/26/2014  . Acute medial meniscal tear 07/16/2014  . Lumbosacral spondylosis without myelopathy 02/17/2014  . Spondylosis, lumbosacral 02/17/2014   Laureen Abrahams, PT, DPT 06/13/2016 2:28 PM  [facility] [address]  [place], [place], [address] Phone: [phone]   Fax:  [phone]  Name: TREA KENWORTHY MRN: [id number] Date of Birth: January 07, 1936

## 2016-06-13 NOTE — ED Notes (Signed)
Fever today. Possible UTI. Urinary frequency. He does self catheterization at night. Recent knee replacement surgery 10 days ago.

## 2016-06-13 NOTE — ED Provider Notes (Signed)
CSN: [id number]     Arrival date & time 06/13/16  1801 History   By signing my name below, I, Matthew Nash, attest that this documentation has been prepared under the direction and in the presence of [name], MD . Electronically Signed: Dyke Nash, Scribe. 06/13/2016. 6:47 PM.   Chief Complaint  Patient presents with  . Fever   The history is provided by the patient. No language interpreter was used.   HPI Comments:  Matthew Nash is a 80 y.o. male with PMHx of BHP who presents to the Emergency Department complaining of sudden onset, gradually improving fever onset today. Pt states he had a temperature of 102 degrees 1.5 hours ago. He reports he has not taken any OTC medicine for fever and fever has improved on its own since being in the ED. He also complains of increased urinary frequency and chills. Pt states he is coughing, but he has had a cough since being hospitalized for knee surgery 2 weeks ago. Per pt, he has difficulty emptying bladder he does self-catheterization at night.  He states he had difficulty removing catheter last night and is unsure why. Pt states he has been voiding approximately ever hour today. Per pt, his last UTI was in 05/17 and was given abx for the infection. He is not currently on abx. Pt denies any new swelling or pain in the knee, difficulty eating or drinking, or hematuria.   Past Medical History  Diagnosis Date  . BPH (benign prostatic hyperplasia)     hx s/p turp  . Hyperlipidemia     takes Simvastatin and Tricor daily-under control  . Joint pain     both knees  . Joint swelling     rt knee  . Chronic back pain     spondylosis  . Melanoma ([place])     right hand;basal cell carcinoma  . H/O hiatal hernia   . History of colon polyps   . Diverticulitis   . Neuropathy ([place])   . Prostate infection     currently taking macrobid  . Diarrhea     x 1 today  . History of chicken pox   . Urinary incontinence   . UTI (lower urinary tract  infection)   . Back pain at L4-L5 level   . Arthritis     both knees  . DDD (degenerative disc disease), lumbar   . History of kidney stones   . Diabetes mellitus     takes Metformin and Victoza daily-under control  . Hepatitis     B-with Mono and jaundice 1960  . GERD (gastroesophageal reflux disease)     takes Omeprazole daily-under control  . Hypertension     takes Lisinopril daily-under control  . H/O measles   . Pneumonia     hx of  . Tinnitus   . Peripheral vascular disease ([place])     diabetic neuropathy  . Chronic kidney disease     kidney stones 1979   Past Surgical History  Procedure Laterality Date  . Shoulder arthroscopy w/ rotator cuff repair Left NOV 2010  . Transurethral resection of prostate  2006  . Cortisone injection    . I&d left knee Left 1958    states 22 times  . C5 and t1 bone chip removed   1986  . Cholecystectomy  1993  . Right shoulder surgery  2012    rotator cuff repair  . Colonoscopy  2011  . Lumbar laminectomy/decompression microdiscectomy Right 02/17/2014  Procedure: RIGHT LUMBAR TWO-THREE LAMINECTOMY;  Surgeon: Charlie Pitter, MD;  Location: [facility];  Service: Neurosurgery;  Laterality: Right;  right   . Knee arthroscopy Right 07/16/2014    Procedure: RIGHT ARTHROSCOPY KNEE WITH Medial and Lateral DEBRIDEMENT and chondroplasty;  Surgeon: Gearlean Alf, MD;  Location: [facility];  Service: Orthopedics;  Laterality: Right;  . Wisdom tooth extraction    . Basal cell carcinoma excision    . Vasectomy  1983  . Tonsillectomy  1942  . Cauterize inner nose  1957  . Total knee arthroplasty Right 05/27/2015    Procedure: RIGHT TOTAL KNEE ARTHROPLASTY;  Surgeon: Gaynelle Arabian, MD;  Location: [facility];  Service: Orthopedics;  Laterality: Right;  . Total knee arthroplasty Left 05/30/2016    Procedure: LEFT TOTAL KNEE ARTHROPLASTY;  Surgeon: Gaynelle Arabian, MD;  Location: [facility];  Service: Orthopedics;  Laterality: Left;   Family History  Problem Relation  Age of Onset  . Heart disease Mother 70    Deceased  . Bladder Cancer Mother   . Prostate cancer Father     Deceased-in 45s  . Healthy Brother   . Skin cancer Brother     "basal cell cancer in the bloodstream"  . Asthma Sister     Deceased  . Healthy Son     #1  . Healthy Daughter     #2  . Obesity Son     #2   Social History  Substance Use Topics  . Smoking status: Former Smoker    Types: Pipe    Quit date: 07/14/1978  . Smokeless tobacco: Never Used     Comment: quit smoking 1979  . Alcohol Use: Yes     Comment: OCCASIONAL    Review of Systems  Constitutional: Negative for appetite change.  Genitourinary: Positive for flank pain. Negative for hematuria.  Musculoskeletal: Negative for joint swelling and arthralgias.  All other systems reviewed and are negative.     Allergies  Albamycin and Penicillins  Home Medications   Prior to Admission medications   Medication Sig Start Date End Date Taking? Authorizing Provider  desmopressin (DDAVP) 0.1 MG tablet Take 0.1 mg by mouth at bedtime.   Yes Historical Provider, MD  fenofibrate (TRICOR) 145 MG tablet Take 1 tablet (145 mg total) by mouth every evening. 05/18/16  Yes Gay Filler Copland, MD  FIBER PO Take 3 capsules by mouth 2 (two) times daily.   Yes Historical Provider, MD  glucose blood (FREESTYLE LITE) test strip Use as instructed to test Blood Glucose twice daily Dx: E11.9 11/11/15  Yes Brunetta Jeans, PA-C  Insulin Pen Needle (CAREFINE PEN NEEDLES) 31G X 8 MM MISC Use once daily with Victoza injections. 05/12/15  Yes Brunetta Jeans, PA-C  Lancets (FREESTYLE) lancets 1 each by Other route 2 (two) times daily. Use as instructed 12/07/15  Yes Brunetta Jeans, PA-C  Liraglutide 18 MG/3ML SOPN Inject 0.3 mLs (1.8 mg total) into the skin daily. 03/23/16  Yes Gay Filler Copland, MD  lisinopril (PRINIVIL,ZESTRIL) 10 MG tablet Take 1 tablet (10 mg total) by mouth every evening. 05/18/16  Yes Gay Filler Copland, MD   metFORMIN (GLUCOPHAGE-XR) 500 MG 24 hr tablet Take 1 tablet (500 mg total) by mouth 2 (two) times daily with a meal. 05/18/16  Yes Gay Filler Copland, MD  PREDNISONE PO Take by mouth.   Yes Historical Provider, MD  rivaroxaban (XARELTO) 10 MG TABS tablet Take 1 tablet (10 mg total) by mouth daily  with breakfast. 05/31/16  Yes Amber Cecilio Asper, PA-C  simvastatin (ZOCOR) 10 MG tablet Take 1 tablet (10 mg total) by mouth at bedtime. 02/07/16  Yes Brunetta Jeans, PA-C  tamsulosin (FLOMAX) 0.4 MG CAPS capsule Take 0.4 mg by mouth daily.    Yes Historical Provider, MD  omeprazole (PRILOSEC) 20 MG capsule Take 1 capsule (20 mg total) by mouth daily. 12/28/15   Brunetta Jeans, PA-C  Probiotic Product (PROBIOTIC PO) Take 1 capsule by mouth daily.    Historical Provider, MD   BP 144/76 mmHg  Pulse 125  Temp(Src) 100.6 F (38.1 C) (Oral)  Resp 18  SpO2 95% Physical Exam  Constitutional: He is oriented to person, place, and time. He appears well-developed and well-nourished. No distress.  HENT:  Head: Normocephalic and atraumatic.  Right Ear: Hearing normal.  Left Ear: Hearing normal.  Nose: Nose normal.  Mouth/Throat: Oropharynx is clear and moist and mucous membranes are normal.  Eyes: Conjunctivae and EOM are normal. Pupils are equal, round, and reactive to light.  Neck: Normal range of motion. Neck supple.  Cardiovascular: Regular rhythm, S1 normal and S2 normal.  Exam reveals no gallop and no friction rub.   No murmur heard. Tachycardia   Pulmonary/Chest: Effort normal and breath sounds normal. No respiratory distress. He has no wheezes. He has no rales. He exhibits no tenderness.  Abdominal: Soft. Normal appearance and bowel sounds are normal. There is no hepatosplenomegaly. There is no tenderness. There is no rebound, no guarding, no tenderness at McBurney's point and negative Murphy's sign. No hernia.  Musculoskeletal: Normal range of motion.  Well healing surgical scar over left knee with  mild swelling and warmth but no draining or erythema over surgical site  Neurological: He is alert and oriented to person, place, and time. He has normal strength. No cranial nerve deficit or sensory deficit. Coordination normal. GCS eye subscore is 4. GCS verbal subscore is 5. GCS motor subscore is 6.  Skin: Skin is warm, dry and intact. No rash noted. No cyanosis.  Psychiatric: He has a normal mood and affect. His speech is normal and behavior is normal. Thought content normal.  Nursing note and vitals reviewed.   ED Course  Procedures  DIAGNOSTIC STUDIES:  Oxygen Saturation is 95% on RA, adequate by my interpretation.    COORDINATION OF CARE:  6:34 PM Will order tylenol, DG chest, CBC, CMP, and urine culture. Discussed treatment plan with pt at bedside and pt agreed to plan.   Labs Review Labs Reviewed  URINALYSIS, ROUTINE W REFLEX MICROSCOPIC (NOT AT [facility]) - Abnormal; Notable for the following:    APPearance CLOUDY (*)    Nitrite POSITIVE (*)    Leukocytes, UA MODERATE (*)    All other components within normal limits  CBC WITH DIFFERENTIAL/PLATELET - Abnormal; Notable for the following:    WBC 13.2 (*)    RBC 3.86 (*)    Hemoglobin 10.9 (*)    HCT 34.0 (*)    Platelets 411 (*)    Neutro Abs 10.8 (*)    Monocytes Absolute 1.1 (*)    All other components within normal limits  COMPREHENSIVE METABOLIC PANEL - Abnormal; Notable for the following:    Glucose, Bld 116 (*)    Albumin 3.4 (*)    AST 9 (*)    ALT 9 (*)    All other components within normal limits  URINE MICROSCOPIC-ADD ON - Abnormal; Notable for the following:    Squamous Epithelial / LPF  0-5 (*)    Bacteria, UA MANY (*)    All other components within normal limits  URINE CULTURE    Imaging Review No results found. I have personally reviewed and evaluated these images and lab results as part of my medical decision-making.   EKG Interpretation None      MDM   Final diagnoses:  UTI (lower urinary  tract infection)    Patient is a 80 year old male presenting today with fever and frequent urination. Patient does have an issue of not completely emptying his bladder and self caths nightly. He denies any abdominal pain, nausea or vomiting. He did recently have a knee replacement 2 weeks ago but there've been no complications with that. He has had no drainage or redness of the knee and is able to bend to over 100. He denies any pain. He has had a cough since leaving the hospital but is not changed in nature. He denies any shortness of breath. Patient initially was tachycardic and febrile here which improved with Tylenol. Labs are consistent with a leukocytosis of 13,000 and UA concerning for a UTI with nitrite and leukocyte positive. Looking on past cultures he has been sensitive to everything except Cipro. Patient was started on Keflex and a urine culture was sent. I personally performed the services described in this documentation, which was scribed in my presence.  The recorded information has been reviewed and considered.      [name], MD 06/13/16 [phone]

## 2016-06-13 NOTE — ED Notes (Signed)
MD at bedside. 

## 2016-06-15 ENCOUNTER — Encounter: Payer: Self-pay | Admitting: Family Medicine

## 2016-06-15 ENCOUNTER — Ambulatory Visit: Payer: Medicare Other | Admitting: Physical Therapy

## 2016-06-15 DIAGNOSIS — M25562 Pain in left knee: Secondary | ICD-10-CM | POA: Diagnosis not present

## 2016-06-15 DIAGNOSIS — R2689 Other abnormalities of gait and mobility: Secondary | ICD-10-CM

## 2016-06-15 DIAGNOSIS — M25662 Stiffness of left knee, not elsewhere classified: Secondary | ICD-10-CM

## 2016-06-15 DIAGNOSIS — N3000 Acute cystitis without hematuria: Secondary | ICD-10-CM

## 2016-06-15 NOTE — Therapy (Signed)
[facility] [address]  [place], [place], [address] Phone: [phone]   Fax:  [phone]  Physical Therapy Treatment  Patient Details  Name: Matthew Nash MRN: [id number] Date of Birth: 1936/02/06 Referring Provider: Dr. Gaynelle Arabian  Encounter Date: 06/15/2016      PT End of Session - 06/15/16 1346    Visit Number 5   Number of Visits 16   Date for PT Re-Evaluation 07/29/16   Authorization Type Tric[employer] - no PTA   PT Start Time 1258   PT Stop Time 1354   PT Time Calculation (min) 56 min   Activity Tolerance Patient tolerated treatment well   Behavior During Therapy [facility] for tasks assessed/performed      Past Medical History  Diagnosis Date  . BPH (benign prostatic hyperplasia)     hx s/p turp  . Hyperlipidemia     takes Simvastatin and Tricor daily-under control  . Joint pain     both knees  . Joint swelling     rt knee  . Chronic back pain     spondylosis  . Melanoma ([place])     right hand;basal cell carcinoma  . H/O hiatal hernia   . History of colon polyps   . Diverticulitis   . Neuropathy ([place])   . Prostate infection     currently taking macrobid  . Diarrhea     x 1 today  . History of chicken pox   . Urinary incontinence   . UTI (lower urinary tract infection)   . Back pain at L4-L5 level   . Arthritis     both knees  . DDD (degenerative disc disease), lumbar   . History of kidney stones   . Diabetes mellitus     takes Metformin and Victoza daily-under control  . Hepatitis     B-with Mono and jaundice 1960  . GERD (gastroesophageal reflux disease)     takes Omeprazole daily-under control  . Hypertension     takes Lisinopril daily-under control  . H/O measles   . Pneumonia     hx of  . Tinnitus   . Peripheral vascular disease ([place])     diabetic neuropathy  . Chronic kidney disease     kidney stones 1979    Past Surgical History  Procedure Laterality Date  . Shoulder  arthroscopy w/ rotator cuff repair Left NOV 2010  . Transurethral resection of prostate  2006  . Cortisone injection    . I&d left knee Left 1958    states 22 times  . C5 and t1 bone chip removed   1986  . Cholecystectomy  1993  . Right shoulder surgery  2012    rotator cuff repair  . Colonoscopy  2011  . Lumbar laminectomy/decompression microdiscectomy Right 02/17/2014    Procedure: RIGHT LUMBAR TWO-THREE LAMINECTOMY;  Surgeon: Charlie Pitter, MD;  Location: [place] [facility];  Service: Neurosurgery;  Laterality: Right;  right   . Knee arthroscopy Right 07/16/2014    Procedure: RIGHT ARTHROSCOPY KNEE WITH Medial and Lateral DEBRIDEMENT and chondroplasty;  Surgeon: Gearlean Alf, MD;  [facility];  Service: Orthopedics;  Laterality: Right;  . Wisdom tooth extraction    . Basal cell carcinoma excision    . Vasectomy  1983  . Tonsillectomy  1942  . Cauterize inner nose  1957  . Total knee arthroplasty Right 05/27/2015    Procedure: RIGHT TOTAL KNEE ARTHROPLASTY;  Surgeon: Gaynelle Arabian,  MD;  [facility];  Service: Orthopedics;  Laterality: Right;  . Total knee arthroplasty Left 05/30/2016    Procedure: LEFT TOTAL KNEE ARTHROPLASTY;  Surgeon: Gaynelle Arabian, MD;  [facility];  Service: Orthopedics;  Laterality: Left;    There were no vitals filed for this visit.      Subjective Assessment - 06/15/16 1300    Subjective went to ED Monday (7/3) and dx with UTI; feels much better today.   Patient Stated Goals improve mobility, stand long enough to go fly fishing   Currently in Pain? Yes   Pain Score 1    Pain Location Knee   Pain Orientation Left   Pain Descriptors / Indicators Aching   Pain Type Surgical pain   Pain Onset 1 to 4 weeks ago   Pain Frequency Intermittent   Aggravating Factors  bending; over exertion   Pain Relieving Factors rest, medication            [facility] PT Assessment - 06/15/16 1347    AROM   Left Knee Extension 2   Left Knee Flexion 100   PROM    Left Knee Flexion 105                     [facility] Adult PT Treatment/Exercise - 06/15/16 1302    Knee/Hip Exercises: Aerobic   Nustep L 6 x 8 min   Knee/Hip Exercises: Machines for Strengthening   Cybex Knee Extension 25# 2x10 with focus on LLE doing most of work   Cybex Knee Flexion 25# 2x10   Knee/Hip Exercises: Standing   Terminal Knee Extension Limitations x15 with ball on wall; with blue theraband x 15; UE support on RW   Forward Step Up Left;20 reps;Hand Hold: 1;Step Height: 4"   Functional Squat 2 sets;10 reps;5 seconds   Functional Squat Limitations with TRX   Knee/Hip Exercises: Supine   Quad Sets Left;15 reps   Quad Sets Limitations on peanut ball   Heel Slides AAROM;Left;15 reps   Heel Slides Limitations with peanut ball and PT overpressure   Vasopneumatic   Number Minutes Vasopneumatic  15 minutes   Vasopnuematic Location  Knee   Vasopneumatic Pressure Medium   Vasopneumatic Temperature  max cold                  PT Short Term Goals - 06/03/16 1115    PT SHORT TERM GOAL #1   Title independent with initial HEP (07/01/16)   Time 4   Period Weeks   Status New   PT SHORT TERM GOAL #2   Title improve L knee AROM 4-95 for improved function and mobilty (07/01/16)   Time 4   Period Weeks   Status New   PT SHORT TERM GOAL #3   Title improve gait velocity to > 1.0 ft/sec for improved function and mobility (07/01/16)   Time 4   Period Weeks   Status New   PT SHORT TERM GOAL #4   Title ambulate > 150' with LRAD modified independent for improved mobility (07/01/16)   Time 4   Period Weeks   Status New           PT Long Term Goals - 06/03/16 1117    PT LONG TERM GOAL #1   Title independent with advanced HEP (07/29/16)   Time 8   Period Weeks   Status New   PT LONG TERM GOAL #2   Title improve L knee AROM 0-105 for  improved function and mobility (07/29/16)   Time 8   Period Weeks   Status New   PT LONG TERM GOAL #3   Title improve gait  velocity to > 1.8 ft/sec for decreased fall risk and improved mobility (07/29/16)   Time 8   Period Weeks   Status New   PT LONG TERM GOAL #4   Title amb > 350' with LRAD on various indoor/outdoor surfaces for improved mobility and ability to go fly fishing (07/29/16)   Time 8   Period Weeks   Status New   PT LONG TERM GOAL #5   Title negotiate single step modified independently with SPC for improved mobility and function (07/29/16)   Time 8   Period Weeks   Status New               Plan - 06/15/16 1346    Clinical Impression Statement Pt arrives today with RW per PT recommendations; and tolerated weight bearing exercises today well without increase in pain.  Continues to have residual swelling so vaso utilized again today to help with edema.  Pt progressing well towards goals.  ROM conitnues to improve.   PT Next Visit Plan aggressive ROM, vaso; work on quad strength and decreasing extensor lag; reinforce need for compliance with HEP   Consulted and Agree with Plan of Care Patient      Patient will benefit from skilled therapeutic intervention in order to improve the following deficits and impairments:  Abnormal gait, Decreased range of motion, Decreased strength, Pain, Difficulty walking, Decreased activity tolerance, Decreased mobility, Decreased balance  Visit Diagnosis: Pain in left knee  Stiffness of left knee, not elsewhere classified  Other abnormalities of gait and mobility     Problem List Patient Active Problem List   Diagnosis Date Noted  . Bladder outlet obstruction 05/02/2016  . OA (osteoarthritis) of knee 05/27/2015  . Diabetes mellitus type II, controlled ([place]) 11/26/2014  . Gastroesophageal reflux disease without esophagitis 11/26/2014  . Arthritis of both knees 11/26/2014  . Absence of bladder continence 11/26/2014  . Hyperlipidemia LDL goal <100 11/26/2014  . Acute medial meniscal tear 07/16/2014  . Lumbosacral spondylosis without myelopathy  02/17/2014  . Spondylosis, lumbosacral 02/17/2014   Laureen Abrahams, PT, DPT 06/15/2016 1:55 PM  [facility] [address]  [place], [place], [address] Phone: [phone]   Fax:  [phone]  Name: Matthew Nash MRN: [id number] Date of Birth: 1936/04/26

## 2016-06-16 LAB — URINE CULTURE: Culture: >=100000 — AB

## 2016-06-17 ENCOUNTER — Telehealth: Payer: Self-pay | Admitting: *Deleted

## 2016-06-17 NOTE — ED Notes (Signed)
Post ED Visit - Positive Culture Follow-up  Culture report reviewed by antimicrobial stewardship pharmacist:  []  Elenor Quinones, Pharm.D. []  Heide Guile, Pharm.D., BCPS [x]  Parks Neptune, Pharm.D. []  Alycia Rossetti, Pharm.D., BCPS []  [place], [place].D., BCPS, AAHIVP []  Legrand Como, Pharm.D., BCPS, AAHIVP []  Milus Glazier, Pharm.D. []  Stephens November, Pharm.D.  Positive urine culture Treated with Cephalexin, organism sensitive to the same and no further patient follow-up is required at this time.  Harlon Flor [facility] 06/17/2016, 9:17 AM

## 2016-06-19 ENCOUNTER — Encounter: Payer: Self-pay | Admitting: Physical Therapy

## 2016-06-20 ENCOUNTER — Ambulatory Visit: Payer: Medicare Other | Admitting: Physical Therapy

## 2016-06-20 VITALS — BP 118/67

## 2016-06-20 DIAGNOSIS — M25662 Stiffness of left knee, not elsewhere classified: Secondary | ICD-10-CM | POA: Diagnosis not present

## 2016-06-20 DIAGNOSIS — M25562 Pain in left knee: Secondary | ICD-10-CM | POA: Diagnosis not present

## 2016-06-20 DIAGNOSIS — R2689 Other abnormalities of gait and mobility: Secondary | ICD-10-CM

## 2016-06-20 NOTE — Therapy (Signed)
[facility] [address]  [place], [place], [address] Phone: [phone]   Fax:  [phone]  Physical Therapy Treatment  Patient Details  Name: Matthew Nash MRN: [id number] Date of Birth: [birth year] Referring Provider: Dr. Gaynelle Arabian  Encounter Date: 06/20/2016      PT End of Session - 06/20/16 1343    Visit Number 6   Number of Visits 16   Date for PT Re-Evaluation 07/29/16   Authorization Type Tricare - no PTA   PT Start Time 1258   PT Stop Time 1343   PT Time Calculation (min) 45 min   Activity Tolerance Patient tolerated treatment well   Behavior During Therapy [facility] for tasks assessed/performed      Past Medical History  Diagnosis Date  . BPH (benign prostatic hyperplasia)     hx s/p turp  . Hyperlipidemia     takes Simvastatin and Tricor daily-under control  . Joint pain     both knees  . Joint swelling     rt knee  . Chronic back pain     spondylosis  . Melanoma ([place])     right hand;basal cell carcinoma  . H/O hiatal hernia   . History of colon polyps   . Diverticulitis   . Neuropathy ([place])   . Prostate infection     currently taking macrobid  . Diarrhea     x 1 today  . History of chicken pox   . Urinary incontinence   . UTI (lower urinary tract infection)   . Back pain at L4-L5 level   . Arthritis     both knees  . DDD (degenerative disc disease), lumbar   . History of kidney stones   . Diabetes mellitus     takes Metformin and Victoza daily-under control  . Hepatitis     B-with Mono and jaundice 1960  . GERD (gastroesophageal reflux disease)     takes Omeprazole daily-under control  . Hypertension     takes Lisinopril daily-under control  . H/O measles   . Pneumonia     hx of  . Tinnitus   . Peripheral vascular disease ([place])     diabetic neuropathy  . Chronic kidney disease     kidney stones 1979    Past Surgical History  Procedure Laterality Date  . Shoulder  arthroscopy w/ rotator cuff repair Left NOV 2010  . Transurethral resection of prostate  2006  . Cortisone injection    . I&d left knee Left 1958    states 22 times  . C5 and t1 bone chip removed   1986  . Cholecystectomy  1993  . Right shoulder surgery  2012    rotator cuff repair  . Colonoscopy  2011  . Lumbar laminectomy/decompression microdiscectomy Right 02/17/2014    Procedure: RIGHT LUMBAR TWO-THREE LAMINECTOMY;  Surgeon: Charlie Pitter, MD;  Location: [place] [facility];  Service: Neurosurgery;  Laterality: Right;  right   . Knee arthroscopy Right 07/16/2014    Procedure: RIGHT ARTHROSCOPY KNEE WITH Medial and Lateral DEBRIDEMENT and chondroplasty;  Surgeon: Gearlean Alf, MD;  [facility];  Service: Orthopedics;  Laterality: Right;  . Wisdom tooth extraction    . Basal cell carcinoma excision    . Vasectomy  1983  . Tonsillectomy  1942  . Cauterize inner nose  1957  . Total knee arthroplasty Right 05/27/2015    Procedure: RIGHT TOTAL KNEE ARTHROPLASTY;  Surgeon: Gaynelle Arabian,  MD;  [facility];  Service: Orthopedics;  Laterality: Right;  . Total knee arthroplasty Left 05/30/2016    Procedure: LEFT TOTAL KNEE ARTHROPLASTY;  Surgeon: Gaynelle Arabian, MD;  [facility];  Service: Orthopedics;  Laterality: Left;    Filed Vitals:   06/20/16 1259  BP: 118/67        Subjective Assessment - 06/20/16 1259    Subjective has returned to doing most chores   How long can you stand comfortably? 15 min   How long can you walk comfortably? 15 min   Patient Stated Goals improve mobility, stand long enough to go fly fishing   Currently in Pain? Yes   Pain Score 3    Pain Location Knee   Pain Orientation Left;Medial   Pain Descriptors / Indicators Aching   Pain Type Surgical pain   Pain Onset 1 to 4 weeks ago   Pain Frequency Intermittent   Aggravating Factors  bending, over exertion   Pain Relieving Factors rest, medication            [facility] PT Assessment - 06/20/16 1321     AROM   Left Knee Extension 2   Left Knee Flexion 105   PROM   Left Knee Extension 0   Left Knee Flexion 110                     [facility] Adult PT Treatment/Exercise - 06/20/16 1302    Knee/Hip Exercises: Aerobic   Nustep L7 x 8 min   Knee/Hip Exercises: Machines for Strengthening   Cybex Knee Extension LLE only 15# 2x10   Cybex Knee Flexion LLE only 15# 2x10   Cybex Leg Press LLE only 20# x15   Knee/Hip Exercises: Standing   Other Standing Knee Exercises sidestepping, backwards and monster walk with blue theraband along counter with UE support   Manual Therapy   Manual Therapy Passive ROM   Passive ROM L knee flexion and extension                  PT Short Term Goals - 06/03/16 1115    PT SHORT TERM GOAL #1   Title independent with initial HEP (07/01/16)   Time 4   Period Weeks   Status New   PT SHORT TERM GOAL #2   Title improve L knee AROM 4-95 for improved function and mobilty (07/01/16)   Time 4   Period Weeks   Status New   PT SHORT TERM GOAL #3   Title improve gait velocity to > 1.0 ft/sec for improved function and mobility (07/01/16)   Time 4   Period Weeks   Status New   PT SHORT TERM GOAL #4   Title ambulate > 150' with LRAD modified independent for improved mobility (07/01/16)   Time 4   Period Weeks   Status New           PT Long Term Goals - 06/03/16 1117    PT LONG TERM GOAL #1   Title independent with advanced HEP (07/29/16)   Time 8   Period Weeks   Status New   PT LONG TERM GOAL #2   Title improve L knee AROM 0-105 for  improved function and mobility (07/29/16)   Time 8   Period Weeks   Status New   PT LONG TERM GOAL #3   Title improve gait velocity to > 1.8 ft/sec for decreased fall risk and improved mobility (07/29/16)   Time 8  Period Weeks   Status New   PT LONG TERM GOAL #4   Title amb > 350' with LRAD on various indoor/outdoor surfaces for improved mobility and ability to go fly fishing (07/29/16)   Time 8    Period Weeks   Status New   PT LONG TERM GOAL #5   Title negotiate single step modified independently with SPC for improved mobility and function (07/29/16)   Time 8   Period Weeks   Status New               Plan - 06/20/16 1343    Clinical Impression Statement Pt with improved ROM today and progressing well.  Will continue to need balance and weight bearing strengthening   PT Next Visit Plan weight bearing exercises   Consulted and Agree with Plan of Care Patient      Patient will benefit from skilled therapeutic intervention in order to improve the following deficits and impairments:  Abnormal gait, Decreased range of motion, Decreased strength, Pain, Difficulty walking, Decreased activity tolerance, Decreased mobility, Decreased balance  Visit Diagnosis: Pain in left knee  Stiffness of left knee, not elsewhere classified  Other abnormalities of gait and mobility     Problem List Patient Active Problem List   Diagnosis Date Noted  . Bladder outlet obstruction 05/02/2016  . OA (osteoarthritis) of knee 05/27/2015  . Diabetes mellitus type II, controlled ([place]) 11/26/2014  . Gastroesophageal reflux disease without esophagitis 11/26/2014  . Arthritis of both knees 11/26/2014  . Absence of bladder continence 11/26/2014  . Hyperlipidemia LDL goal <100 11/26/2014  . Acute medial meniscal tear 07/16/2014  . Lumbosacral spondylosis without myelopathy 02/17/2014  . Spondylosis, lumbosacral 02/17/2014   Laureen Abrahams, PT, DPT 06/20/2016 1:45 PM  [facility] [address]  [place], [place], [address] Phone: [phone]   Fax:  [phone]  Name: Matthew Nash MRN: [id number] Date of Birth: [birth year]

## 2016-06-21 ENCOUNTER — Encounter: Payer: Self-pay | Admitting: Family Medicine

## 2016-06-21 DIAGNOSIS — N3944 Nocturnal enuresis: Secondary | ICD-10-CM

## 2016-06-21 MED ORDER — DESMOPRESSIN ACETATE 0.2 MG PO TABS: 0.2000 mg | ORAL_TABLET | Freq: Every day | ORAL | Status: DC

## 2016-06-22 ENCOUNTER — Ambulatory Visit: Payer: Medicare Other | Admitting: Physical Therapy

## 2016-06-22 DIAGNOSIS — M25662 Stiffness of left knee, not elsewhere classified: Secondary | ICD-10-CM

## 2016-06-22 DIAGNOSIS — M25562 Pain in left knee: Secondary | ICD-10-CM

## 2016-06-22 DIAGNOSIS — R2689 Other abnormalities of gait and mobility: Secondary | ICD-10-CM

## 2016-06-22 NOTE — Therapy (Signed)
[facility] [address]  [place], [place], [address] Phone: [phone]   Fax:  [phone]  Physical Therapy Treatment  Patient Details  Name: Matthew Nash MRN: [id number] Date of Birth: 1936-02-11 Referring Provider: Dr. Gaynelle Arabian  Encounter Date: 06/22/2016      PT End of Session - 06/22/16 1345    Visit Number 7   Number of Visits 16   Date for PT Re-Evaluation 07/29/16   Authorization Type T[employer] - no PTA   PT Start Time 1258   PT Stop Time 1356   PT Time Calculation (min) 58 min   Equipment Utilized During Treatment Gait belt   Activity Tolerance Patient tolerated treatment well   Behavior During Therapy [facility] for tasks assessed/performed      Past Medical History  Diagnosis Date  . BPH (benign prostatic hyperplasia)     hx s/p turp  . Hyperlipidemia     takes Simvastatin and Tricor daily-under control  . Joint pain     both knees  . Joint swelling     rt knee  . Chronic back pain     spondylosis  . Melanoma ([place])     right hand;basal cell carcinoma  . H/O hiatal hernia   . History of colon polyps   . Diverticulitis   . Neuropathy ([place])   . Prostate infection     currently taking macrobid  . Diarrhea     x 1 today  . History of chicken pox   . Urinary incontinence   . UTI (lower urinary tract infection)   . Back pain at L4-L5 level   . Arthritis     both knees  . DDD (degenerative disc disease), lumbar   . History of kidney stones   . Diabetes mellitus     takes Metformin and Victoza daily-under control  . Hepatitis     B-with Mono and jaundice 1960  . GERD (gastroesophageal reflux disease)     takes Omeprazole daily-under control  . Hypertension     takes Lisinopril daily-under control  . H/O measles   . Pneumonia     hx of  . Tinnitus   . Peripheral vascular disease ([place])     diabetic neuropathy  . Chronic kidney disease     kidney stones 1979    Past Surgical  History  Procedure Laterality Date  . Shoulder arthroscopy w/ rotator cuff repair Left NOV 2010  . Transurethral resection of prostate  2006  . Cortisone injection    . I&d left knee Left 1958    states 22 times  . C5 and t1 bone chip removed   1986  . Cholecystectomy  1993  . Right shoulder surgery  2012    rotator cuff repair  . Colonoscopy  2011  . Lumbar laminectomy/decompression microdiscectomy Right 02/17/2014    Procedure: RIGHT LUMBAR TWO-THREE LAMINECTOMY;  Surgeon: Charlie Pitter, MD;  Location: [place] [facility];  Service: Neurosurgery;  Laterality: Right;  right   . Knee arthroscopy Right 07/16/2014    Procedure: RIGHT ARTHROSCOPY KNEE WITH Medial and Lateral DEBRIDEMENT and chondroplasty;  Surgeon: Gearlean Alf, MD;  [facility];  Service: Orthopedics;  Laterality: Right;  . Wisdom tooth extraction    . Basal cell carcinoma excision    . Vasectomy  1983  . Tonsillectomy  1942  . Cauterize inner nose  1957  . Total knee arthroplasty Right 05/27/2015    Procedure:  RIGHT TOTAL KNEE ARTHROPLASTY;  Surgeon: Ollen GrossFrank [name], MD;  [facility];  Service: Orthopedics;  Laterality: Right;  . Total knee arthroplasty Left 05/30/2016    Procedure: LEFT TOTAL KNEE ARTHROPLASTY;  Surgeon: Ollen GrossFrank [name], MD;  [facility];  Service: Orthopedics;  Laterality: Left;    There were no vitals filed for this visit.      Subjective Assessment - 06/22/16 1303    Subjective knee feels ok; still having some pain on the medial knee   Patient Stated Goals improve mobility, stand long enough to go fly fishing   Currently in Pain? Yes   Pain Score 2    Pain Location Knee   Pain Orientation Left;Medial   Pain Descriptors / Indicators Aching   Pain Type Surgical pain   Pain Onset 1 to 4 weeks ago   Pain Frequency Intermittent   Aggravating Factors  bending, over exertion   Pain Relieving Factors rest, meds                         [facility] Adult PT Treatment/Exercise -  06/22/16 1304    Knee/Hip Exercises: Stretches   Quad Stretch Left;5 reps;30 seconds   Quad Stretch Limitations prone with strap   Knee/Hip Exercises: Aerobic   Nustep L7 x 8 min   Knee/Hip Exercises: Machines for Strengthening   Cybex Knee Extension LLE only 15# 2x10   Cybex Knee Flexion LLE only 15# 2x10   Cybex Leg Press LLE only 20# x10   Knee/Hip Exercises: Standing   Lateral Step Up Left;20 reps;Hand Hold: 2;Step Height: 4"   Lateral Step Up Limitations holding poles   Forward Step Up Left;20 reps;Hand Hold: 2;Step Height: 4"   Forward Step Up Limitations holding onto poles   Knee/Hip Exercises: Seated   Sit to Sand 10 reps;without UE support   Knee/Hip Exercises: Prone   Hamstring Curl 10 reps   Hamstring Curl Limitations 5#   Straight Leg Raises Limitations prone TKE with 5# x 10   Vasopneumatic   Number Minutes Vasopneumatic  10 minutes  stopped early as pt needed to use restroom   Vasopnuematic Location  Knee   Vasopneumatic Pressure Medium   Vasopneumatic Temperature  max cold                  PT Short Term Goals - 06/22/16 1346    PT SHORT TERM GOAL #1   Title independent with initial HEP (07/01/16)   Status Achieved   PT SHORT TERM GOAL #2   Title improve L knee AROM 4-95 for improved function and mobilty (07/01/16)   Status Achieved   PT SHORT TERM GOAL #3   Title improve gait velocity to > 1.0 ft/sec for improved function and mobility (07/01/16)   Status On-going   PT SHORT TERM GOAL #4   Title ambulate > 150' with LRAD modified independent for improved mobility (07/01/16)   Status On-going           PT Long Term Goals - 06/03/16 1117    PT LONG TERM GOAL #1   Title independent with advanced HEP (07/29/16)   Time 8   Period Weeks   Status New   PT LONG TERM GOAL #2   Title improve L knee AROM 0-105 for  improved function and mobility (07/29/16)   Time 8   Period Weeks   Status New   PT LONG TERM GOAL #3   Title improve gait velocity  to >  1.8 ft/sec for decreased fall risk and improved mobility (07/29/16)   Time 8   Period Weeks   Status New   PT LONG TERM GOAL #4   Title amb > 350' with LRAD on various indoor/outdoor surfaces for improved mobility and ability to go fly fishing (07/29/16)   Time 8   Period Weeks   Status New   PT LONG TERM GOAL #5   Title negotiate single step modified independently with SPC for improved mobility and function (07/29/16)   Time 8   Period Weeks   Status New               Plan - 06/22/16 1346    Clinical Impression Statement Pt with increased pain with strengthening exercises today so vaso used at end of session.  Tolerated weight bearing exercises well.     PT Next Visit Plan weight bearing exercises      Patient will benefit from skilled therapeutic intervention in order to improve the following deficits and impairments:  Abnormal gait, Decreased range of motion, Decreased strength, Pain, Difficulty walking, Decreased activity tolerance, Decreased mobility, Decreased balance  Visit Diagnosis: Pain in left knee  Stiffness of left knee, not elsewhere classified  Other abnormalities of gait and mobility     Problem List Patient Active Problem List   Diagnosis Date Noted  . Bladder outlet obstruction 05/02/2016  . OA (osteoarthritis) of knee 05/27/2015  . Diabetes mellitus type II, controlled ([place]) 11/26/2014  . Gastroesophageal reflux disease without esophagitis 11/26/2014  . Arthritis of both knees 11/26/2014  . Absence of bladder continence 11/26/2014  . Hyperlipidemia LDL goal <100 11/26/2014  . Acute medial meniscal tear 07/16/2014  . Lumbosacral spondylosis without myelopathy 02/17/2014  . Spondylosis, lumbosacral 02/17/2014   Laureen Abrahams, PT, DPT 06/22/2016 1:57 PM  [facility] [address]  [place], [place], [address] Phone: [phone]   Fax:  [phone]  Name: TRENTYN EKHOLM MRN: [id number] Date of Birth: 07/05/1936

## 2016-06-27 ENCOUNTER — Ambulatory Visit: Payer: Medicare Other | Admitting: Physical Therapy

## 2016-06-27 DIAGNOSIS — M25662 Stiffness of left knee, not elsewhere classified: Secondary | ICD-10-CM | POA: Diagnosis not present

## 2016-06-27 DIAGNOSIS — R2689 Other abnormalities of gait and mobility: Secondary | ICD-10-CM

## 2016-06-27 DIAGNOSIS — M25562 Pain in left knee: Secondary | ICD-10-CM

## 2016-06-27 NOTE — Therapy (Signed)
[facility] [address]  [place], [place], [address] Phone: [phone]   Fax:  [phone]  Physical Therapy Treatment  Patient Details  Name: Matthew Nash MRN: [id number] Date of Birth: 24-Jan-1936 Referring Provider: Dr. Gaynelle Arabian  Encounter Date: 06/27/2016      PT End of Session - 06/27/16 1351    Visit Number 8   Number of Visits 16   Date for PT Re-Evaluation 07/29/16   Authorization Type [employer] - no PTA   PT Start Time 1259   PT Stop Time 1350   PT Time Calculation (min) 51 min   Equipment Utilized During Treatment Gait belt   Activity Tolerance Patient tolerated treatment well   Behavior During Therapy [facility] for tasks assessed/performed      Past Medical History  Diagnosis Date  . BPH (benign prostatic hyperplasia)     hx s/p turp  . Hyperlipidemia     takes Simvastatin and Tricor daily-under control  . Joint pain     both knees  . Joint swelling     rt knee  . Chronic back pain     spondylosis  . Melanoma ([place])     right hand;basal cell carcinoma  . H/O hiatal hernia   . History of colon polyps   . Diverticulitis   . Neuropathy ([place])   . Prostate infection     currently taking macrobid  . Diarrhea     x 1 today  . History of chicken pox   . Urinary incontinence   . UTI (lower urinary tract infection)   . Back pain at L4-L5 level   . Arthritis     both knees  . DDD (degenerative disc disease), lumbar   . History of kidney stones   . Diabetes mellitus     takes Metformin and Victoza daily-under control  . Hepatitis     B-with Mono and jaundice 1960  . GERD (gastroesophageal reflux disease)     takes Omeprazole daily-under control  . Hypertension     takes Lisinopril daily-under control  . H/O measles   . Pneumonia     hx of  . Tinnitus   . Peripheral vascular disease ([place])     diabetic neuropathy  . Chronic kidney disease     kidney stones 1979    Past Surgical  History  Procedure Laterality Date  . Shoulder arthroscopy w/ rotator cuff repair Left NOV 2010  . Transurethral resection of prostate  2006  . Cortisone injection    . I&d left knee Left 1958    states 22 times  . C5 and t1 bone chip removed   1986  . Cholecystectomy  1993  . Right shoulder surgery  2012    rotator cuff repair  . Colonoscopy  2011  . Lumbar laminectomy/decompression microdiscectomy Right 02/17/2014    Procedure: RIGHT LUMBAR TWO-THREE LAMINECTOMY;  Surgeon: Charlie Pitter, MD;  Location: [place] [facility];  Service: Neurosurgery;  Laterality: Right;  right   . Knee arthroscopy Right 07/16/2014    Procedure: RIGHT ARTHROSCOPY KNEE WITH Medial and Lateral DEBRIDEMENT and chondroplasty;  Surgeon: Gearlean Alf, MD;  Location: [facility];  Service: Orthopedics;  Laterality: Right;  . Wisdom tooth extraction    . Basal cell carcinoma excision    . Vasectomy  1983  . Tonsillectomy  1942  . Cauterize inner nose  1957  . Total knee arthroplasty Right 05/27/2015    Procedure:  RIGHT TOTAL KNEE ARTHROPLASTY;  Surgeon: Gaynelle Arabian, MD;  Location: [facility];  Service: Orthopedics;  Laterality: Right;  . Total knee arthroplasty Left 05/30/2016    Procedure: LEFT TOTAL KNEE ARTHROPLASTY;  Surgeon: Gaynelle Arabian, MD;  Location: [facility];  Service: Orthopedics;  Laterality: Left;    There were no vitals filed for this visit.      Subjective Assessment - 06/27/16 1303    Subjective "I did some of them" - referring to HEP   Pertinent History see EPIC snapshot;    How long can you sit comfortably? 4 hours   How long can you stand comfortably? 15 min   How long can you walk comfortably? 15 min   Patient Stated Goals improve mobility, stand long enough to go fly fishing   Currently in Pain? Yes   Pain Score 1    Pain Location Knee   Pain Orientation Left;Medial   Pain Descriptors / Indicators Aching   Pain Type Surgical pain   Pain Onset 1 to 4 weeks ago   Pain Frequency Intermittent             [facility] PT Assessment - 06/27/16 1307    AROM   Left Knee Extension 1   Left Knee Flexion 107                     [facility] Adult PT Treatment/Exercise - 06/27/16 1307    Ambulation/Gait   Ambulation/Gait Yes   Ambulation/Gait Assistance 4: Min guard   Ambulation Distance (Feet) 700 Feet   Assistive device Straight cane   Gait Pattern Step-to pattern;Decreased dorsiflexion - right;Decreased dorsiflexion - left;Poor foot clearance - left;Poor foot clearance - right;Decreased stance time - left;Decreased step length - right;Decreased hip/knee flexion - left;Antalgic   Ambulation Surface Level;Unlevel;Indoor;Outdoor;Paved;Grass   Gait Comments LOB x 1 when stepping back onto pavement from grass; pt self corrected but minguard A provided   Knee/Hip Exercises: Aerobic   Nustep L7 x 8 min   Knee/Hip Exercises: Machines for Strengthening   Cybex Knee Extension LLE only 15# 2x10   Cybex Knee Flexion LLE only 15# 2x10   Knee/Hip Exercises: Standing   Other Standing Knee Exercises standing hip 4-way with red theraband x 10 bil with bil UE pole support                  PT Short Term Goals - 06/27/16 1353    PT SHORT TERM GOAL #1   Title independent with initial HEP (07/01/16)   Status Achieved   PT SHORT TERM GOAL #2   Title improve L knee AROM 4-95 for improved function and mobilty (07/01/16)   Status Achieved   PT SHORT TERM GOAL #3   Title improve gait velocity to > 1.0 ft/sec for improved function and mobility (07/01/16)   Status On-going   PT SHORT TERM GOAL #4   Title ambulate > 150' with LRAD modified independent for improved mobility (07/01/16)   Status Achieved           PT Long Term Goals - 06/27/16 1354    PT LONG TERM GOAL #1   Title independent with advanced HEP (07/29/16)   Status On-going   PT LONG TERM GOAL #2   Title improve L knee AROM 0-105 for  improved function and mobility (07/29/16)   Status Partially Met   PT LONG TERM GOAL #3    Title improve gait velocity to > 1.8 ft/sec for decreased fall risk  and improved mobility (07/29/16)   Status On-going   PT LONG TERM GOAL #4   Title amb > 350' with LRAD on various indoor/outdoor surfaces for improved mobility and ability to go fly fishing (07/29/16)   Status Achieved   PT LONG TERM GOAL #5   Title negotiate single step modified independently with SPC for improved mobility and function (07/29/16)   Status On-going               Plan - 06/27/16 1351    Clinical Impression Statement Pt is progressing well with PT and hopeful for d/c from the MD this week.  Overall pt is progressing well; still has some balance difficulty on unlevel ground but this is somewhat baseline due to neuropathy.  Will plan to finish assessing goals next visit and continue per MD recommendations.   PT Next Visit Plan finish assessing goals; see what MD says      Patient will benefit from skilled therapeutic intervention in order to improve the following deficits and impairments:  Abnormal gait, Decreased range of motion, Decreased strength, Pain, Difficulty walking, Decreased activity tolerance, Decreased mobility, Decreased balance  Visit Diagnosis: Pain in left knee  Stiffness of left knee, not elsewhere classified  Other abnormalities of gait and mobility     Problem List Patient Active Problem List   Diagnosis Date Noted  . Bladder outlet obstruction 05/02/2016  . OA (osteoarthritis) of knee 05/27/2015  . Diabetes mellitus type II, controlled ([place]) 11/26/2014  . Gastroesophageal reflux disease without esophagitis 11/26/2014  . Arthritis of both knees 11/26/2014  . Absence of bladder continence 11/26/2014  . Hyperlipidemia LDL goal <100 11/26/2014  . Acute medial meniscal tear 07/16/2014  . Lumbosacral spondylosis without myelopathy 02/17/2014  . Spondylosis, lumbosacral 02/17/2014   Laureen Abrahams, PT, DPT 06/27/2016 1:58 PM  [facility] [address]  [place], [place], [address] Phone: [phone]   Fax:  [phone]  Name: SHERMAR FRIEDLAND MRN: [id number] Date of Birth: 08-10-1936

## 2016-06-29 ENCOUNTER — Ambulatory Visit: Payer: Medicare Other | Admitting: Physical Therapy

## 2016-06-29 DIAGNOSIS — M25662 Stiffness of left knee, not elsewhere classified: Secondary | ICD-10-CM

## 2016-06-29 DIAGNOSIS — M25562 Pain in left knee: Secondary | ICD-10-CM

## 2016-06-29 DIAGNOSIS — R2689 Other abnormalities of gait and mobility: Secondary | ICD-10-CM

## 2016-06-29 NOTE — Therapy (Addendum)
[facility] [address]  [place], [place], [address] Phone: [phone]   Fax:  [phone]  Physical Therapy Treatment  Patient Details  Name: Matthew Nash MRN: [id number] Date of Birth: 09-25-1936 Referring Provider: Dr. Gaynelle Arabian  Encounter Date: 06/29/2016      PT End of Session - 06/29/16 1427    Visit Number 9   Number of Visits 16   Date for PT Re-Evaluation 07/29/16   Authorization Type [employer] - no PTA   PT Start Time 1300   PT Stop Time 1342   PT Time Calculation (min) 42 min   Equipment Utilized During Treatment Gait belt   Activity Tolerance Patient tolerated treatment well   Behavior During Therapy [facility] for tasks assessed/performed      Past Medical History  Diagnosis Date  . BPH (benign prostatic hyperplasia)     hx s/p turp  . Hyperlipidemia     takes Simvastatin and Tricor daily-under control  . Joint pain     both knees  . Joint swelling     rt knee  . Chronic back pain     spondylosis  . Melanoma ([place])     right hand;basal cell carcinoma  . H/O hiatal hernia   . History of colon polyps   . Diverticulitis   . Neuropathy ([place])   . Prostate infection     currently taking macrobid  . Diarrhea     x 1 today  . History of chicken pox   . Urinary incontinence   . UTI (lower urinary tract infection)   . Back pain at L4-L5 level   . Arthritis     both knees  . DDD (degenerative disc disease), lumbar   . History of kidney stones   . Diabetes mellitus     takes Metformin and Victoza daily-under control  . Hepatitis     B-with Mono and jaundice 1960  . GERD (gastroesophageal reflux disease)     takes Omeprazole daily-under control  . Hypertension     takes Lisinopril daily-under control  . H/O measles   . Pneumonia     hx of  . Tinnitus   . Peripheral vascular disease ([place])     diabetic neuropathy  . Chronic kidney disease     kidney stones 1979    Past Surgical  History  Procedure Laterality Date  . Shoulder arthroscopy w/ rotator cuff repair Left NOV 2010  . Transurethral resection of prostate  2006  . Cortisone injection    . I&d left knee Left 1958    states 22 times  . C5 and t1 bone chip removed   1986  . Cholecystectomy  1993  . Right shoulder surgery  2012    rotator cuff repair  . Colonoscopy  2011  . Lumbar laminectomy/decompression microdiscectomy Right 02/17/2014    Procedure: RIGHT LUMBAR TWO-THREE LAMINECTOMY;  Surgeon: Charlie Pitter, MD;  Location: [facility] [facility];  Service: Neurosurgery;  Laterality: Right;  right   . Knee arthroscopy Right 07/16/2014    Procedure: RIGHT ARTHROSCOPY KNEE WITH Medial and Lateral DEBRIDEMENT and chondroplasty;  Surgeon: Gearlean Alf, MD;  Location: [facility];  Service: Orthopedics;  Laterality: Right;  . Wisdom tooth extraction    . Basal cell carcinoma excision    . Vasectomy  1983  . Tonsillectomy  1942  . Cauterize inner nose  1957  . Total knee arthroplasty Right 05/27/2015    Procedure:  RIGHT TOTAL KNEE ARTHROPLASTY;  Surgeon: Gaynelle Arabian, MD;  Location: [facility];  Service: Orthopedics;  Laterality: Right;  . Total knee arthroplasty Left 05/30/2016    Procedure: LEFT TOTAL KNEE ARTHROPLASTY;  Surgeon: Gaynelle Arabian, MD;  [facility]: [facility];  Service: Orthopedics;  Laterality: Left;    There were no vitals filed for this visit.      Subjective Assessment - 06/29/16 1303    Subjective feels great   Patient Stated Goals improve mobility, stand long enough to go fly fishing   Currently in Pain? No/denies            [facility] PT Assessment - 06/29/16 1305    Observation/Other Assessments   Focus on Therapeutic Outcomes (FOTO)  60 (40% limited)   AROM   Left Knee Extension 0   Left Knee Flexion 115   PROM   Left Knee Extension 0   Left Knee Flexion 115                     [facility] Adult PT Treatment/Exercise - 06/29/16 1305    Ambulation/Gait   Ambulation/Gait Assistance 6:  Modified independent (Device/Increase time)   Gait velocity 3.17 ft/sec  10.34 sec   Stairs Yes   Stairs Assistance 5: Supervision   Stairs Assistance Details (indicate cue type and reason) pt reports he as 2-3" step to enter home and uses cane and door for UE support   Stair Management Technique One rail Right;With cane;Alternating pattern   Number of Stairs 3   Height of Stairs 8   Knee/Hip Exercises: Stretches   Art gallery manager reps;30 seconds   Quad Stretch Limitations prone with strap   Knee/Hip Exercises: Aerobic   Nustep L7 x 8 min   Knee/Hip Exercises: Standing   Hip Flexion Both;15 reps;Knee bent;Knee straight   Hip Flexion Limitations 4#   Hip Abduction Both;15 reps;Knee straight   Abduction Limitations 4#   Hip Extension Both;15 reps;Knee straight   Extension Limitations 4#   Knee/Hip Exercises: Supine   Heel Slides AAROM;Left;10 reps   Heel Slides Limitations with strap                  PT Short Term Goals - 06/29/16 1327    PT SHORT TERM GOAL #1   Title independent with initial HEP (07/01/16)   Status Achieved   PT SHORT TERM GOAL #2   Title improve L knee AROM 4-95 for improved function and mobilty (07/01/16)   Status Achieved   PT SHORT TERM GOAL #3   Title improve gait velocity to > 1.0 ft/sec for improved function and mobility (07/01/16)   Status Achieved   PT SHORT TERM GOAL #4   Title ambulate > 150' with LRAD modified independent for improved mobility (07/01/16)   Status Achieved           PT Long Term Goals - 06/29/16 1327    PT LONG TERM GOAL #1   Title independent with advanced HEP (07/29/16)   Status Achieved   PT LONG TERM GOAL #2   Title improve L knee AROM 0-105 for  improved function and mobility (07/29/16)   Status Achieved   PT LONG TERM GOAL #3   Title improve gait velocity to > 1.8 ft/sec for decreased fall risk and improved mobility (07/29/16)   Status Achieved   PT LONG TERM GOAL #4   Title amb > 350' with LRAD on  various indoor/outdoor surfaces for improved mobility and ability to  go fly fishing (07/29/16)   Status Achieved   PT LONG TERM GOAL #5   Title negotiate single step modified independently with SPC for improved mobility and function (07/29/16)   Baseline met with cane and rail   Status Partially Met               Plan - 07/23/2016 1427    Clinical Impression Statement Pt has met all LTGs and anticipate d/c after MD appointment 06/30/16.  Pt still has some high level balance deficits which are likely baseline and pt has agreed to stop fly fishing as he knows it puts him at a high fall risk.  Will plan to continue or d/c depending on MD visit.   PT Next Visit Plan d/c v/s renew pending MD visit; will need new g code   Consulted and Agree with Plan of Care Patient      Patient will benefit from skilled therapeutic intervention in order to improve the following deficits and impairments:  Abnormal gait, Decreased range of motion, Decreased strength, Pain, Difficulty walking, Decreased activity tolerance, Decreased mobility, Decreased balance  Visit Diagnosis: Pain in left knee  Stiffness of left knee, not elsewhere classified  Other abnormalities of gait and mobility       G-Codes - Jul 23, 2016 1429    Functional Assessment Tool Used FOTO 40% limited   Functional Limitation Mobility: Walking and moving around   Mobility: Walking and Moving Around Goal Status [phone]) At least 40 percent but less than 60 percent impaired, limited or restricted   Mobility: Walking and Moving Around Discharge Status [phone]) At least 40 percent but less than 60 percent impaired, limited or restricted      Problem List Patient Active Problem List   Diagnosis Date Noted  . Bladder outlet obstruction 05/02/2016  . OA (osteoarthritis) of knee 05/27/2015  . Diabetes mellitus type II, controlled ([place]) 11/26/2014  . Gastroesophageal reflux disease without esophagitis 11/26/2014  . Arthritis of both knees  11/26/2014  . Absence of bladder continence 11/26/2014  . Hyperlipidemia LDL goal <100 11/26/2014  . Acute medial meniscal tear 07/16/2014  . Lumbosacral spondylosis without myelopathy 02/17/2014  . Spondylosis, lumbosacral 02/17/2014   Laureen Abrahams, PT, DPT 07/23/16 2:30 PM  [facility] [address]  [place], [place], [address] Phone: [phone]   Fax:  [phone]  Name: SAGAN MASELLI MRN: [id number] Date of Birth: 11-01-1936        PHYSICAL THERAPY DISCHARGE SUMMARY  Visits from Start of Care: 9  Current functional level related to goals / functional outcomes: See above   Remaining deficits: Baseline balance deficits due to neuropathy; recommended to use [facility]   Education / Equipment: HEP  Plan: Patient agrees to discharge.  Patient goals were met. Patient is being discharged due to meeting the stated rehab goals.  ?????    Laureen Abrahams, PT, DPT 06/30/2016 11:00 AM  [facility] [place], [place] [address]  [phone] (office) [phone] (fax)

## 2016-06-30 ENCOUNTER — Other Ambulatory Visit: Payer: Medicare Other

## 2016-06-30 DIAGNOSIS — N3000 Acute cystitis without hematuria: Secondary | ICD-10-CM

## 2016-06-30 DIAGNOSIS — Z96652 Presence of left artificial knee joint: Secondary | ICD-10-CM | POA: Diagnosis not present

## 2016-06-30 DIAGNOSIS — Z471 Aftercare following joint replacement surgery: Secondary | ICD-10-CM | POA: Diagnosis not present

## 2016-07-03 LAB — URINE CULTURE

## 2016-07-04 ENCOUNTER — Other Ambulatory Visit: Payer: Self-pay | Admitting: Emergency Medicine

## 2016-07-04 ENCOUNTER — Encounter: Payer: Self-pay | Admitting: Family Medicine

## 2016-07-04 DIAGNOSIS — E119 Type 2 diabetes mellitus without complications: Secondary | ICD-10-CM

## 2016-07-05 MED ORDER — TAMSULOSIN HCL 0.4 MG PO CAPS: 0.4000 mg | ORAL_CAPSULE | Freq: Every day | ORAL | 3 refills | Status: DC

## 2016-07-05 MED ORDER — OMEPRAZOLE 20 MG PO CPDR: 20.0000 mg | DELAYED_RELEASE_CAPSULE | Freq: Every day | ORAL | 3 refills | Status: DC

## 2016-07-05 MED ORDER — INSULIN PEN NEEDLE 31G X 8 MM MISC: 3 refills | Status: DC

## 2016-07-06 ENCOUNTER — Encounter: Payer: Self-pay | Admitting: Family Medicine

## 2016-07-06 MED ORDER — LEVOFLOXACIN 500 MG PO TABS: 250.0000 mg | ORAL_TABLET | Freq: Every day | ORAL | 0 refills | Status: DC

## 2016-07-06 NOTE — Telephone Encounter (Signed)
Recent urine culture showed a low growth of enterococcus which is susceptible to levaquin.  Recent normal qtc of 410, normal kidney function.  He is allergic to pcn so will treat with levaquin 250/day for 10 days

## 2016-07-22 ENCOUNTER — Encounter: Payer: Self-pay | Admitting: Family Medicine

## 2016-07-25 ENCOUNTER — Encounter: Payer: Self-pay | Admitting: Family Medicine

## 2016-07-27 ENCOUNTER — Encounter: Payer: Self-pay | Admitting: Family Medicine

## 2016-07-27 ENCOUNTER — Ambulatory Visit (INDEPENDENT_AMBULATORY_CARE_PROVIDER_SITE_OTHER): Payer: Medicare Other | Admitting: Family Medicine

## 2016-07-27 VITALS — BP 130/68 | HR 82 | Temp 98.1°F | Ht 71.0 in | Wt 222.6 lb

## 2016-07-27 DIAGNOSIS — N3281 Overactive bladder: Secondary | ICD-10-CM | POA: Diagnosis not present

## 2016-07-27 DIAGNOSIS — N3944 Nocturnal enuresis: Secondary | ICD-10-CM | POA: Diagnosis not present

## 2016-07-27 LAB — POC URINALSYSI DIPSTICK (AUTOMATED)
Bilirubin, UA: NEGATIVE
Glucose, UA: NEGATIVE
Ketones, UA: NEGATIVE
Leukocytes, UA: NEGATIVE
NITRITE UA: NEGATIVE
PH UA: 6
Protein, UA: NEGATIVE
RBC UA: NEGATIVE
UROBILINOGEN UA: NEGATIVE

## 2016-07-27 NOTE — Progress Notes (Signed)
Pre visit review using our clinic review tool, if applicable. No additional management support is needed unless otherwise documented below in the visit note. 

## 2016-07-27 NOTE — Progress Notes (Signed)
[place] at [facility] [address], [place], [place] [phone]  Date:  07/27/2016   Name:  Matthew Nash   DOB:  27-Sep-1936   MRN:  [id number]  PCP:  Lamar Blinks, MD    Chief Complaint: Overactive bladder (Pt here for overactive bladder)   History of Present Illness:  Matthew Nash is a 80 y.o. very pleasant male patient who presents with the following:  Seen by myself last in June for his bladder incontinence and nocturnal enuresis. We had increased his DDAVP from 0.1 mg to 0.2 at our last visit.  This helped temporarily and in fact for a couple of weeks he was sleeping dry. However now his sx are back, he is waking up around 0330 and having to change his depends, he will change them again at around 0500.  He will sometimes still wet the sheets.  This is causing distress for the pt and his wife.      He takes his flomax in the am and the DDAVP at night. He will self cath twice daily- AM and PM.  During the day when he voids, he may pass an estimated 100- 150 ml, with a weak stream. When he really had to pee after evening TV he will pass more urine.  He also notes that in the evening hours he will seem to have increased urine production and will urinate every hour or more until he does to bed.   He does have some leakage of urine during the day- however not like what he has at night.  He wears a "guard" during the day to ensure he stays dry, but does not need diapers like he needs at night.   No dysuria, hematuria, belly pain, back pain, fever, chills "overall I feel good, I just have this one problem."   Patient Active Problem List   Diagnosis Date Noted  . Bladder outlet obstruction 05/02/2016  . OA (osteoarthritis) of knee 05/27/2015  . Diabetes mellitus type II, controlled ([place]) 11/26/2014  . Gastroesophageal reflux disease without esophagitis 11/26/2014  . Arthritis of both knees 11/26/2014  . Absence of  bladder continence 11/26/2014  . Hyperlipidemia LDL goal <100 11/26/2014  . Acute medial meniscal tear 07/16/2014  . Lumbosacral spondylosis without myelopathy 02/17/2014  . Spondylosis, lumbosacral 02/17/2014    Past Medical History:  Diagnosis Date  . Arthritis    both knees  . Back pain at L4-L5 level   . BPH (benign prostatic hyperplasia)    hx s/p turp  . Chronic back pain    spondylosis  . Chronic kidney disease    kidney stones 1979  . DDD (degenerative disc disease), lumbar   . Diabetes mellitus    takes Metformin and Victoza daily-under control  . Diarrhea    x 1 today  . Diverticulitis   . GERD (gastroesophageal reflux disease)    takes Omeprazole daily-under control  . H/O hiatal hernia   . H/O measles   . Hepatitis    B-with Mono and jaundice 1960  . History of chicken pox   . History of colon polyps   . History of kidney stones   . Hyperlipidemia    takes Simvastatin and Tricor daily-under control  . Hypertension    takes Lisinopril daily-under control  . Joint pain    both knees  . Joint swelling    rt knee  . Melanoma ([place])  right hand;basal cell carcinoma  . Neuropathy ([place])   . Peripheral vascular disease ([place])    diabetic neuropathy  . Pneumonia    hx of  . Prostate infection    currently taking macrobid  . Tinnitus   . Urinary incontinence   . UTI (lower urinary tract infection)     Past Surgical History:  Procedure Laterality Date  . BASAL CELL CARCINOMA EXCISION    . C5 and T1 bone chip removed   1986  . [place]  . CHOLECYSTECTOMY  1993  . COLONOSCOPY  2011  . cortisone injection    . I&D left knee Left 1958   states 22 times  . KNEE ARTHROSCOPY Right 07/16/2014   Procedure: RIGHT ARTHROSCOPY KNEE WITH Medial and Lateral DEBRIDEMENT and chondroplasty;  Surgeon: Gearlean Alf, MD;  Location: [facility];  Service: Orthopedics;  Laterality: Right;  . LUMBAR LAMINECTOMY/DECOMPRESSION MICRODISCECTOMY Right 02/17/2014    Procedure: RIGHT LUMBAR TWO-THREE LAMINECTOMY;  Surgeon: Charlie Pitter, MD;  Location: [place] [facility];  Service: Neurosurgery;  Laterality: Right;  right   . right shoulder surgery  2012   rotator cuff repair  . SHOULDER ARTHROSCOPY W/ ROTATOR CUFF REPAIR Left NOV 2010  . TONSILLECTOMY  1942  . TOTAL KNEE ARTHROPLASTY Right 05/27/2015   Procedure: RIGHT TOTAL KNEE ARTHROPLASTY;  Surgeon: Gaynelle Arabian, MD;  Location: [facility];  Service: Orthopedics;  Laterality: Right;  . TOTAL KNEE ARTHROPLASTY Left 05/30/2016   Procedure: LEFT TOTAL KNEE ARTHROPLASTY;  Surgeon: Gaynelle Arabian, MD;  Location: [facility];  Service: Orthopedics;  Laterality: Left;  . TRANSURETHRAL RESECTION OF PROSTATE  2006  . VASECTOMY  1983  . WISDOM TOOTH EXTRACTION      Social History  Substance Use Topics  . Smoking status: Former Smoker    Types: Pipe    Quit date: 07/14/1978  . Smokeless tobacco: Never Used     Comment: quit smoking 1979  . Alcohol use Yes     Comment: OCCASIONAL    Family History  Problem Relation Age of Onset  . Heart disease Mother 61    Deceased  . Bladder Cancer Mother   . Prostate cancer Father     Deceased-in 22s  . Healthy Brother   . Skin cancer Brother     "basal cell cancer in the bloodstream"  . Asthma Sister     Deceased  . Healthy Son     #1  . Healthy Daughter     #2  . Obesity Son     #2    Allergies  Allergen Reactions  . Albamycin [Novobiocin] Other (See Comments)    Made me look like a strawberry   . Penicillins Rash and Other (See Comments)    Has patient had a PCN reaction causing immediate rash, facial/tongue/throat swelling, SOB or lightheadedness with hypotension:  no Has patient had a PCN reaction causing severe rash involving mucus membranes or skin necrosis: no Has patient had a PCN reaction that required hospitalization no Has patient had a PCN reaction occurring within the last 10 years: no - childhood reaction If all of the above answers are "NO", then may  proceed with Cephalosporin use.     Medication list has been reviewed and updated.  Current Outpatient Prescriptions on File Prior to Visit  Medication Sig Dispense Refill  . desmopressin (DDAVP) 0.2 MG tablet Take 1 tablet (0.2 mg total) by mouth at bedtime. 90 tablet 3  . fenofibrate (TRICOR) 145  MG tablet Take 1 tablet (145 mg total) by mouth every evening. 90 tablet 1  . FIBER PO Take 3 capsules by mouth 2 (two) times daily.    [name] Kitchen glucose blood (FREESTYLE LITE) test strip Use as instructed to test Blood Glucose twice daily Dx: E11.9 200 each 3  . Insulin Pen Needle (CAREFINE PEN NEEDLES) 31G X 8 MM MISC Use once daily with Victoza injections. 100 each 3  . Lancets (FREESTYLE) lancets 1 each by Other route 2 (two) times daily. Use as instructed 200 each 3  . Liraglutide 18 MG/3ML SOPN Inject 0.3 mLs (1.8 mg total) into the skin daily. 27 mL 1  . lisinopril (PRINIVIL,ZESTRIL) 10 MG tablet Take 1 tablet (10 mg total) by mouth every evening. 90 tablet 1  . metFORMIN (GLUCOPHAGE-XR) 500 MG 24 hr tablet Take 1 tablet (500 mg total) by mouth 2 (two) times daily with a meal. 180 tablet 1  . omeprazole (PRILOSEC) 20 MG capsule Take 1 capsule (20 mg total) by mouth daily. 90 capsule 3  . Probiotic Product (PROBIOTIC PO) Take 1 capsule by mouth daily.    . simvastatin (ZOCOR) 10 MG tablet Take 1 tablet (10 mg total) by mouth at bedtime. 90 tablet 1  . tamsulosin (FLOMAX) 0.4 MG CAPS capsule Take 1 capsule (0.4 mg total) by mouth daily. 90 capsule 3   No current facility-administered medications on file prior to visit.     Review of Systems:  As per HPI- otherwise negative. Creat clearance is 68   Physical Examination: Vitals:   07/27/16 1610  BP: 130/68  Pulse: 82  Temp: 98.1 F (36.7 C)   Vitals:   07/27/16 1610  Weight: 222 lb 9.6 oz (101 kg)  Height: 5\' 11"  (1.803 m)   Body mass index is 31.05 kg/m. Ideal Body Weight: Weight in (lb) to have BMI = 25: 178.9  GEN: WDWN, NAD,  Non-toxic, A & O x 3, overweight, looks well HEENT: Atraumatic, Normocephalic. Neck supple. No masses, No LAD. Ears and Nose: No external deformity. CV: RRR, No M/G/R. No JVD. No thrill. No extra heart sounds. PULM: CTA B, no wheezes, crackles, rhonchi. No retractions. No resp. distress. No accessory muscle use. ABD: S, NT, ND, +BS. No rebound. No HSM. EXTR: No c/c/e NEURO Normal gait.  PSYCH: Normally interactive. Conversant. Not depressed or anxious appearing.  Calm demeanor.   Results for orders placed or performed in visit on 07/27/16  POCT Urinalysis Dipstick (Automated)  Result Value Ref Range   Color, UA Yellow    Clarity, UA Clear    Glucose, UA negative    Bilirubin, UA negative    Ketones, UA negative    Spec Grav, UA >=1.030    Blood, UA negative    pH, UA 6.0    Protein, UA negative    Urobilinogen, UA negative    Nitrite, UA negative    Leukocytes, UA Negative Negative    Assessment and Plan: Overactive bladder - Plan: POCT Urinalysis Dipstick (Automated)  Nocturnal enuresis  Here today to discuss his recurrent and long term issue with nocturnal enuresis and some urinary incont during the day as well.  This is a very frustrating issue for him and I wish I had a solution that would provide relief.  He can go up on his DDVAP, so we will try increasing to 0.3 mg.  At this time I do not see any sign of a bladder infection.  He will let me know how  he does with this increase   Signed Lamar Blinks, MD

## 2016-07-27 NOTE — Patient Instructions (Addendum)
Let's increase your DDAVP to 0.3 mg at bedtime.  Please let me know how this does for you!  I am sorry that you continue to have this issue with your urinary symptoms- I know that this is very frustrating for you

## 2016-08-04 DIAGNOSIS — Z96652 Presence of left artificial knee joint: Secondary | ICD-10-CM | POA: Diagnosis not present

## 2016-08-04 DIAGNOSIS — Z471 Aftercare following joint replacement surgery: Secondary | ICD-10-CM | POA: Diagnosis not present

## 2016-08-16 ENCOUNTER — Encounter: Payer: Self-pay | Admitting: Family Medicine

## 2016-08-16 DIAGNOSIS — Z961 Presence of intraocular lens: Secondary | ICD-10-CM | POA: Diagnosis not present

## 2016-08-16 DIAGNOSIS — E119 Type 2 diabetes mellitus without complications: Secondary | ICD-10-CM | POA: Diagnosis not present

## 2016-08-16 DIAGNOSIS — H35372 Puckering of macula, left eye: Secondary | ICD-10-CM | POA: Diagnosis not present

## 2016-08-16 DIAGNOSIS — I10 Essential (primary) hypertension: Secondary | ICD-10-CM | POA: Diagnosis not present

## 2016-08-16 DIAGNOSIS — Z5181 Encounter for therapeutic drug level monitoring: Secondary | ICD-10-CM

## 2016-08-16 MED ORDER — SIMVASTATIN 10 MG PO TABS: 10.0000 mg | ORAL_TABLET | Freq: Every day | ORAL | 1 refills | Status: DC

## 2016-08-18 ENCOUNTER — Encounter: Payer: Self-pay | Admitting: Family Medicine

## 2016-08-18 ENCOUNTER — Other Ambulatory Visit (INDEPENDENT_AMBULATORY_CARE_PROVIDER_SITE_OTHER): Payer: Medicare Other

## 2016-08-18 DIAGNOSIS — Z5181 Encounter for therapeutic drug level monitoring: Secondary | ICD-10-CM

## 2016-08-18 LAB — BASIC METABOLIC PANEL
BUN: 22 mg/dL (ref 6–23)
CHLORIDE: 108 meq/L (ref 96–112)
CO2: 27 mEq/L (ref 19–32)
Calcium: 9.1 mg/dL (ref 8.4–10.5)
Creatinine, Ser: 1.09 mg/dL (ref 0.40–1.50)
GFR: 69.47 mL/min (ref >60.00)
GLUCOSE: 148 mg/dL — AB (ref 70–99)
POTASSIUM: 3.9 meq/L (ref 3.5–5.1)
SODIUM: 140 meq/L (ref 135–145)

## 2016-08-19 ENCOUNTER — Other Ambulatory Visit: Payer: Self-pay | Admitting: Ophthalmology

## 2016-08-19 DIAGNOSIS — H4912 Fourth [trochlear] nerve palsy, left eye: Secondary | ICD-10-CM

## 2016-08-29 ENCOUNTER — Ambulatory Visit
Admission: RE | Admit: 2016-08-29 | Discharge: 2016-08-29 | Disposition: A | Discharge status: Home / Self Care | Payer: Medicare Other | Source: Ambulatory Visit | Attending: Ophthalmology | Admitting: Ophthalmology

## 2016-08-29 DIAGNOSIS — H4912 Fourth [trochlear] nerve palsy, left eye: Secondary | ICD-10-CM

## 2016-08-29 DIAGNOSIS — H532 Diplopia: Secondary | ICD-10-CM | POA: Diagnosis not present

## 2016-08-29 MED ORDER — GADOBENATE DIMEGLUMINE 529 MG/ML IV SOLN
20.0000 mL | Freq: Once | INTRAVENOUS | Status: AC | PRN
  Administered 2016-08-29: 20 mL via INTRAVENOUS

## 2016-09-01 ENCOUNTER — Ambulatory Visit (INDEPENDENT_AMBULATORY_CARE_PROVIDER_SITE_OTHER): Payer: Medicare Other | Admitting: Behavioral Health

## 2016-09-01 DIAGNOSIS — Z23 Encounter for immunization: Secondary | ICD-10-CM

## 2016-09-01 NOTE — Progress Notes (Addendum)
Pre visit review using our clinic review tool, if applicable. No additional management support is needed unless otherwise documented below in the visit note.  Patient in clinic today for Influenza vaccination. IM given in Right Deltoid. Patient tolerated injection well.

## 2016-09-02 ENCOUNTER — Encounter: Payer: Self-pay | Admitting: Family Medicine

## 2016-09-14 ENCOUNTER — Other Ambulatory Visit: Payer: Self-pay | Admitting: Family Medicine

## 2016-09-16 ENCOUNTER — Telehealth: Payer: Self-pay | Admitting: Emergency Medicine

## 2016-09-16 MED ORDER — LIRAGLUTIDE 18 MG/3ML ~~LOC~~ SOPN: 1.8000 mg | PEN_INJECTOR | Freq: Every day | SUBCUTANEOUS | 1 refills | Status: DC

## 2016-09-16 NOTE — Telephone Encounter (Signed)
Received refill request for VICTOZA PEN INJECTOR 3X3ML 6MG /ML. Last office visit 07/27/16. This medication is no longer on pt's med list. Will it be ok to refill? Please advise.

## 2016-09-20 ENCOUNTER — Other Ambulatory Visit: Payer: Self-pay | Admitting: Emergency Medicine

## 2016-09-22 ENCOUNTER — Encounter: Payer: Self-pay | Admitting: Family Medicine

## 2016-09-22 ENCOUNTER — Ambulatory Visit (INDEPENDENT_AMBULATORY_CARE_PROVIDER_SITE_OTHER): Payer: Medicare Other | Admitting: Family Medicine

## 2016-09-22 VITALS — BP 114/70 | HR 95 | Temp 98.3°F | Ht 71.0 in | Wt 223.2 lb

## 2016-09-22 DIAGNOSIS — E119 Type 2 diabetes mellitus without complications: Secondary | ICD-10-CM | POA: Diagnosis not present

## 2016-09-22 DIAGNOSIS — Z789 Other specified health status: Secondary | ICD-10-CM | POA: Diagnosis not present

## 2016-09-22 LAB — HEMOGLOBIN A1C: HEMOGLOBIN A1C: 6.4 % (ref 4.6–6.5)

## 2016-09-22 NOTE — Progress Notes (Signed)
Pre visit review using our clinic review tool, if applicable. No additional management support is needed unless otherwise documented below in the visit note. 

## 2016-09-22 NOTE — Patient Instructions (Addendum)
It was great to see you today as always- I'll be in touch with your A1c results I am sorry that nighttime urine leakage continues to be such a bother for you.  As we discussed, you are far from alone in this problem and it is not do to anything that you are doing wrong.    You might try a bedwetting alarm marketed for kids to see if this will help you wake up when you start to urinate- I am not sure if this will help but it could be worth trying.   There is also the idea of a condom catheter and bag to keep you dry.  Let me know if I can help you in any way

## 2016-09-22 NOTE — Progress Notes (Signed)
[place] at [facility] [address] [place],  [address] [phone]  Date:  09/22/2016   Name:  Matthew Nash   DOB:  08/14/1936   MRN:  [id number]  PCP:  Lamar Blinks, MD    Chief Complaint: Follow-up (Pt here to f/u on visit bladder )   History of Present Illness:  Matthew Nash is a 80 y.o. very pleasant male patient who presents with the following:  Last seen by myself in August with the following HPI:  Seen by myself last in June for his bladder incontinence and nocturnal enuresis. We had increased his DDAVP from 0.1 mg to 0.2 at our last visit.  This helped temporarily and in fact for a couple of weeks he was sleeping dry. However now his sx are back, he is waking up around 0330 and having to change his depends, he will change them again at around 0500.  He will sometimes still wet the sheets.  This is causing distress for the pt and his wife.      He takes his flomax in the am and the DDAVP at night. He will self cath twice daily- AM and PM.  During the day when he voids, he may pass an estimated 100- 150 ml, with a weak stream. When he really had to pee after evening TV he will pass more urine.  He also notes that in the evening hours he will seem to have increased urine production and will urinate every hour or more until he does to bed.   He does have some leakage of urine during the day- however not like what he has at night.  He wears a "guard" during the day to ensure he stays dry, but does not need diapers like he needs at night.   No dysuria, hematuria, belly pain, back pain, fever, chills "overall I feel good, I just have this one problem."   He is now using self cath in the early am- he will wet his depends overnight still. He will urinate every 2-3 hours during the day, more frequently towards the evening hours.  However no matter what he does he seems to produce a lot of urine at night, and will soak a  diaper 2-3x.  He will not wake up when he needs to go, but will wake up wet.  He He is taking flomax in the am and the DDAVP at night.   He continues to sometimes have overflow of his depends at night- this can cause him to wet his pajamas or even the whole bed.   He stops drinking fluids around 5- 6 pm.   He does not drink any alcohol or soda.   His DM is very well controlled- he is on victoza am and metformin BID.   He plans to visit [facility] for a [employer] reunion in a few months  Lab Results  Component Value Date   HGBA1C 6.3 05/18/2016   He does wear bilateral AFO for foot drop  Patient Active Problem List   Diagnosis Date Noted  . Bladder outlet obstruction 05/02/2016  . OA (osteoarthritis) of knee 05/27/2015  . Diabetes mellitus type II, controlled ([place]) 11/26/2014  . Gastroesophageal reflux disease without esophagitis 11/26/2014  . Arthritis of both knees 11/26/2014  . Absence of bladder continence 11/26/2014  . Hyperlipidemia LDL goal <100 11/26/2014  . Acute medial meniscal tear 07/16/2014  . Lumbosacral spondylosis without myelopathy 02/17/2014  .  Spondylosis, lumbosacral 02/17/2014    Past Medical History:  Diagnosis Date  . Arthritis    both knees  . Back pain at L4-L5 level   . BPH (benign prostatic hyperplasia)    hx s/p turp  . Chronic back pain    spondylosis  . Chronic kidney disease    kidney stones 1979  . DDD (degenerative disc disease), lumbar   . Diabetes mellitus    takes Metformin and Victoza daily-under control  . Diarrhea    x 1 today  . Diverticulitis   . GERD (gastroesophageal reflux disease)    takes Omeprazole daily-under control  . H/O hiatal hernia   . H/O measles   . Hepatitis    B-with Mono and jaundice 1960  . History of chicken pox   . History of colon polyps   . History of kidney stones   . Hyperlipidemia    takes Simvastatin and Tricor daily-under control  . Hypertension    takes Lisinopril daily-under control  .  Joint pain    both knees  . Joint swelling    rt knee  . Melanoma ([place])    right hand;basal cell carcinoma  . Neuropathy ([place])   . Peripheral vascular disease ([place])    diabetic neuropathy  . Pneumonia    hx of  . Prostate infection    currently taking macrobid  . Tinnitus   . Urinary incontinence   . UTI (lower urinary tract infection)     Past Surgical History:  Procedure Laterality Date  . BASAL CELL CARCINOMA EXCISION    . C5 and T1 bone chip removed   1986  . [place]  . CHOLECYSTECTOMY  1993  . COLONOSCOPY  2011  . cortisone injection    . I&D left knee Left 1958   states 22 times  . KNEE ARTHROSCOPY Right 07/16/2014   Procedure: RIGHT ARTHROSCOPY KNEE WITH Medial and Lateral DEBRIDEMENT and chondroplasty;  Surgeon: Gearlean Alf, MD;  [facility]: [facility];  Service: Orthopedics;  Laterality: Right;  . LUMBAR LAMINECTOMY/DECOMPRESSION MICRODISCECTOMY Right 02/17/2014   Procedure: RIGHT LUMBAR TWO-THREE LAMINECTOMY;  Surgeon: Charlie Pitter, MD;  Location: [place] [facility];  Service: Neurosurgery;  Laterality: Right;  right   . right shoulder surgery  2012   rotator cuff repair  . SHOULDER ARTHROSCOPY W/ ROTATOR CUFF REPAIR Left NOV 2010  . TONSILLECTOMY  1942  . TOTAL KNEE ARTHROPLASTY Right 05/27/2015   Procedure: RIGHT TOTAL KNEE ARTHROPLASTY;  Surgeon: Gaynelle Arabian, MD;  [facility];  Service: Orthopedics;  Laterality: Right;  . TOTAL KNEE ARTHROPLASTY Left 05/30/2016   Procedure: LEFT TOTAL KNEE ARTHROPLASTY;  Surgeon: Gaynelle Arabian, MD;  [facility];  Service: Orthopedics;  Laterality: Left;  . TRANSURETHRAL RESECTION OF PROSTATE  2006  . VASECTOMY  1983  . WISDOM TOOTH EXTRACTION      Social History  Substance Use Topics  . Smoking status: Former Smoker    Types: Pipe    Quit date: 07/14/1978  . Smokeless tobacco: Never Used     Comment: quit smoking 1979  . Alcohol use Yes     Comment: OCCASIONAL    Family History  Problem Relation  Age of Onset  . Heart disease Mother 92    Deceased  . Bladder Cancer Mother   . Prostate cancer Father     Deceased-in 71s  . Healthy Brother   . Skin cancer Brother     "basal cell cancer in  the bloodstream"  . Asthma Sister     Deceased  . Healthy Son     #1  . Healthy Daughter     #2  . Obesity Son     #2    Allergies  Allergen Reactions  . Albamycin [Novobiocin] Other (See Comments)    Made me look like a strawberry   . Penicillins Rash and Other (See Comments)    Has patient had a PCN reaction causing immediate rash, facial/tongue/throat swelling, SOB or lightheadedness with hypotension:  no Has patient had a PCN reaction causing severe rash involving mucus membranes or skin necrosis: no Has patient had a PCN reaction that required hospitalization no Has patient had a PCN reaction occurring within the last 10 years: no - childhood reaction If all of the above answers are "NO", then may proceed with Cephalosporin use.     Medication list has been reviewed and updated.  Current Outpatient Prescriptions on File Prior to Visit  Medication Sig Dispense Refill  . desmopressin (DDAVP) 0.2 MG tablet Take 1 tablet (0.2 mg total) by mouth at bedtime. 90 tablet 3  . fenofibrate (TRICOR) 145 MG tablet Take 1 tablet (145 mg total) by mouth every evening. 90 tablet 1  . FIBER PO Take 3 capsules by mouth 2 (two) times daily.    [name] Kitchen glucose blood (FREESTYLE LITE) test strip Use as instructed to test Blood Glucose twice daily Dx: E11.9 200 each 3  . Insulin Pen Needle (CAREFINE PEN NEEDLES) 31G X 8 MM MISC Use once daily with Victoza injections. 100 each 3  . Lancets (FREESTYLE) lancets 1 each by Other route 2 (two) times daily. Use as instructed 200 each 3  . Liraglutide 18 MG/3ML SOPN Inject 0.3 mLs (1.8 mg total) into the skin daily. 27 mL 1  . lisinopril (PRINIVIL,ZESTRIL) 10 MG tablet Take 1 tablet (10 mg total) by mouth every evening. 90 tablet 1  . metFORMIN (GLUCOPHAGE-XR) 500  MG 24 hr tablet Take 1 tablet (500 mg total) by mouth 2 (two) times daily with a meal. 180 tablet 1  . omeprazole (PRILOSEC) 20 MG capsule Take 1 capsule (20 mg total) by mouth daily. 90 capsule 3  . Probiotic Product (PROBIOTIC PO) Take 1 capsule by mouth daily.    . simvastatin (ZOCOR) 10 MG tablet Take 1 tablet (10 mg total) by mouth at bedtime. 90 tablet 1  . tamsulosin (FLOMAX) 0.4 MG CAPS capsule Take 1 capsule (0.4 mg total) by mouth daily. 90 capsule 3   No current facility-administered medications on file prior to visit.     Review of Systems:  As per HPI- otherwise negative.   Physical Examination:  Blood pressure 114/70, pulse 95, temperature 98.3 F (36.8 C), temperature source Oral, height 5\' 11"  (1.803 m), weight 223 lb 3.2 oz (101.2 kg), SpO2 94 %. Ideal Body Weight:    GEN: WDWN, NAD, Non-toxic, A & O x 3, overweight, looks well otherwise  HEENT: Atraumatic, Normocephalic. Neck supple. No masses, No LAD. Ears and Nose: No external deformity. CV: RRR, No M/G/R. No JVD. No thrill. No extra heart sounds. PULM: CTA B, no wheezes, crackles, rhonchi. No retractions. No resp. distress. No accessory muscle use. ABD: S, NT, ND EXTR: No c/c/e NEURO Normal gait.  PSYCH: Normally interactive. Conversant. Not depressed or anxious appearing.  Calm demeanor.  Looks well, wearing bilateral AFOs  Foot exam today  Assessment and Plan: Controlled type 2 diabetes mellitus without complication, without long-term current use of  insulin ([place]) - Plan: Hemoglobin A1C  Intermittent self-catheterization of bladder  He continues to be tormented by nighttime enuresis. He has tried many different things to try and address this issue.  Currently he is taking flomax in the am and DDAVP at night, and wearing a diaper to bed.  He is somewhat interested in trying a bed alarm meant to kids to see if he can re-train himself to wake up when he needs to void.  This is certainly worth a try He also  plans to re-visit things with his urologist to see if they have anything to add Will check his A1c today  Signed Lamar Blinks, MD

## 2016-09-24 ENCOUNTER — Encounter: Payer: Self-pay | Admitting: Family Medicine

## 2016-10-03 ENCOUNTER — Encounter: Payer: Self-pay | Admitting: Family Medicine

## 2016-10-03 DIAGNOSIS — G479 Sleep disorder, unspecified: Secondary | ICD-10-CM

## 2016-10-04 DIAGNOSIS — H5022 Vertical strabismus, left eye: Secondary | ICD-10-CM | POA: Diagnosis not present

## 2016-10-20 ENCOUNTER — Encounter: Payer: Self-pay | Admitting: Family Medicine

## 2016-10-20 ENCOUNTER — Ambulatory Visit (INDEPENDENT_AMBULATORY_CARE_PROVIDER_SITE_OTHER): Payer: Medicare Other | Admitting: Family Medicine

## 2016-10-20 VITALS — BP 136/72 | HR 81 | Temp 98.0°F | Ht 71.0 in | Wt 223.8 lb

## 2016-10-20 DIAGNOSIS — L03032 Cellulitis of left toe: Secondary | ICD-10-CM

## 2016-10-20 DIAGNOSIS — G63 Polyneuropathy in diseases classified elsewhere: Secondary | ICD-10-CM | POA: Diagnosis not present

## 2016-10-20 DIAGNOSIS — G629 Polyneuropathy, unspecified: Secondary | ICD-10-CM | POA: Insufficient documentation

## 2016-10-20 MED ORDER — DOXYCYCLINE HYCLATE 100 MG PO CAPS: 100.0000 mg | ORAL_CAPSULE | Freq: Two times a day (BID) | ORAL | 0 refills | Status: DC

## 2016-10-20 NOTE — Progress Notes (Signed)
[place] [facility] [address] [place], [place] [address] [phone]  Date:  10/20/2016   Name:  Matthew Nash   DOB:  Apr 13, 1936   MRN:  [id number]  PCP:  Lamar Blinks, MD    Chief Complaint: Nail Problem (c/o toenail coming off on left big toe and second toe on the right. Pt did a lot of walking while in [place] )   History of Present Illness:  Matthew Nash is a 80 y.o. very pleasant male patient who presents with the following: History of DM, arthritis, peripheral neuropathy He returned a week ago from a trip to [place]- he did a lot of walking on this trip but did not notice any trauma or pain in his toes. He noted a problem with his left great toenail when he got home from his trip; about one week ago it seemed to be red around the base, and this has gotten worse.  There is now drainage from under the nail and the nail seems loose.  In addition, the 2nd toe of the right foot caught in his jeans 3 days ago and pulled nearly 100% off.  He is not having any pain from either of these toes No fever, malaise, body aches  Lab Results  Component Value Date   HGBA1C 6.4 09/22/2016   His DM has been under very good control He has poor sensation to his feet but does not have any pain  Patient Active Problem List   Diagnosis Date Noted  . Bladder outlet obstruction 05/02/2016  . OA (osteoarthritis) of knee 05/27/2015  . Diabetes mellitus type II, controlled ([place]) 11/26/2014  . Gastroesophageal reflux disease without esophagitis 11/26/2014  . Arthritis of both knees 11/26/2014  . Absence of bladder continence 11/26/2014  . Hyperlipidemia LDL goal <100 11/26/2014  . Acute medial meniscal tear 07/16/2014  . Lumbosacral spondylosis without myelopathy 02/17/2014  . Spondylosis, lumbosacral 02/17/2014    Past Medical History:  Diagnosis Date  . Arthritis    both knees  . Back pain at L4-L5 level   . BPH (benign  prostatic hyperplasia)    hx s/p turp  . Chronic back pain    spondylosis  . Chronic kidney disease    kidney stones 1979  . DDD (degenerative disc disease), lumbar   . Diabetes mellitus    takes Metformin and Victoza daily-under control  . Diarrhea    x 1 today  . Diverticulitis   . GERD (gastroesophageal reflux disease)    takes Omeprazole daily-under control  . H/O hiatal hernia   . H/O measles   . Hepatitis    B-with Mono and jaundice 1960  . History of chicken pox   . History of colon polyps   . History of kidney stones   . Hyperlipidemia    takes Simvastatin and Tricor daily-under control  . Hypertension    takes Lisinopril daily-under control  . Joint pain    both knees  . Joint swelling    rt knee  . Melanoma ([place])    right hand;basal cell carcinoma  . Neuropathy ([place])   . Peripheral vascular disease ([place])    diabetic neuropathy  . Pneumonia    hx of  . Prostate infection    currently taking macrobid  . Tinnitus   . Urinary incontinence   . UTI (lower urinary tract infection)     Past Surgical History:  Procedure Laterality Date  .  BASAL CELL CARCINOMA EXCISION    . C5 and T1 bone chip removed   1986  . [place]  . CHOLECYSTECTOMY  1993  . COLONOSCOPY  2011  . cortisone injection    . I&D left knee Left 1958   states 22 times  . KNEE ARTHROSCOPY Right 07/16/2014   Procedure: RIGHT ARTHROSCOPY KNEE WITH Medial and Lateral DEBRIDEMENT and chondroplasty;  Surgeon: Gearlean Alf, MD;  Location: [facility];  Service: Orthopedics;  Laterality: Right;  . LUMBAR LAMINECTOMY/DECOMPRESSION MICRODISCECTOMY Right 02/17/2014   Procedure: RIGHT LUMBAR TWO-THREE LAMINECTOMY;  Surgeon: Charlie Pitter, MD;  Location: [facility];  Service: Neurosurgery;  Laterality: Right;  right   . right shoulder surgery  2012   rotator cuff repair  . SHOULDER ARTHROSCOPY W/ ROTATOR CUFF REPAIR Left NOV 2010  . TONSILLECTOMY  1942  . TOTAL KNEE ARTHROPLASTY Right  05/27/2015   Procedure: RIGHT TOTAL KNEE ARTHROPLASTY;  Surgeon: Gaynelle Arabian, MD;  Location: [facility];  Service: Orthopedics;  Laterality: Right;  . TOTAL KNEE ARTHROPLASTY Left 05/30/2016   Procedure: LEFT TOTAL KNEE ARTHROPLASTY;  Surgeon: Gaynelle Arabian, MD;  Location: [facility];  Service: Orthopedics;  Laterality: Left;  . TRANSURETHRAL RESECTION OF PROSTATE  2006  . VASECTOMY  1983  . WISDOM TOOTH EXTRACTION      Social History  Substance Use Topics  . Smoking status: Former Smoker    Types: Pipe    Quit date: 07/14/1978  . Smokeless tobacco: Never Used     Comment: quit smoking 1979  . Alcohol use Yes     Comment: OCCASIONAL    Family History  Problem Relation Age of Onset  . Heart disease Mother 58    Deceased  . Bladder Cancer Mother   . Prostate cancer Father     Deceased-in 63s  . Healthy Brother   . Skin cancer Brother     "basal cell cancer in the bloodstream"  . Asthma Sister     Deceased  . Healthy Son     #1  . Healthy Daughter     #2  . Obesity Son     #2    Allergies  Allergen Reactions  . Albamycin [Novobiocin] Other (See Comments)    Made me look like a strawberry   . Penicillins Rash and Other (See Comments)    Has patient had a PCN reaction causing immediate rash, facial/tongue/throat swelling, SOB or lightheadedness with hypotension:  no Has patient had a PCN reaction causing severe rash involving mucus membranes or skin necrosis: no Has patient had a PCN reaction that required hospitalization no Has patient had a PCN reaction occurring within the last 10 years: no - childhood reaction If all of the above answers are "NO", then may proceed with Cephalosporin use.     Medication list has been reviewed and updated.  Current Outpatient Prescriptions on File Prior to Visit  Medication Sig Dispense Refill  . desmopressin (DDAVP) 0.2 MG tablet Take 1 tablet (0.2 mg total) by mouth at bedtime. 90 tablet 3  . fenofibrate (TRICOR) 145 MG tablet Take 1  tablet (145 mg total) by mouth every evening. 90 tablet 1  . FIBER PO Take 3 capsules by mouth 2 (two) times daily.    [name] Kitchen glucose blood (FREESTYLE LITE) test strip Use as instructed to test Blood Glucose twice daily Dx: E11.9 200 each 3  . Insulin Pen Needle (CAREFINE PEN NEEDLES) 31G X 8 MM MISC Use once  daily with Victoza injections. 100 each 3  . Lancets (FREESTYLE) lancets 1 each by Other route 2 (two) times daily. Use as instructed 200 each 3  . Liraglutide 18 MG/3ML SOPN Inject 0.3 mLs (1.8 mg total) into the skin daily. 27 mL 1  . lisinopril (PRINIVIL,ZESTRIL) 10 MG tablet Take 1 tablet (10 mg total) by mouth every evening. 90 tablet 1  . metFORMIN (GLUCOPHAGE-XR) 500 MG 24 hr tablet Take 1 tablet (500 mg total) by mouth 2 (two) times daily with a meal. 180 tablet 1  . omeprazole (PRILOSEC) 20 MG capsule Take 1 capsule (20 mg total) by mouth daily. 90 capsule 3  . Probiotic Product (PROBIOTIC PO) Take 1 capsule by mouth daily.    . simvastatin (ZOCOR) 10 MG tablet Take 1 tablet (10 mg total) by mouth at bedtime. 90 tablet 1  . tamsulosin (FLOMAX) 0.4 MG CAPS capsule Take 1 capsule (0.4 mg total) by mouth daily. 90 capsule 3   No current facility-administered medications on file prior to visit.     Review of Systems:  As per HPI- otherwise negative.   Physical Examination: Vitals:   10/20/16 1650  BP: 136/72  Pulse: 81  Temp: 98 F (36.7 C)   Vitals:   10/20/16 1650  Weight: 223 lb 12.8 oz (101.5 kg)  Height: 5\' 11"  (1.803 m)   Body mass index is 31.21 kg/m. Ideal Body Weight: Weight in (lb) to have BMI = 25: 178.9  GEN: WDWN, NAD, Non-toxic, A & O x 3, looks well HEENT: Atraumatic, Normocephalic. Neck supple. No masses, No LAD. Ears and Nose: No external deformity. CV: RRR, No M/G/R. No JVD. No thrill. No extra heart sounds. PULM: CTA B, no wheezes, crackles, rhonchi. No retractions. No resp. distress. No accessory muscle use. EXTR: No c/c/e NEURO Normal gait.   PSYCH: Normally interactive. Conversant. Not depressed or anxious appearing.  Calm demeanor.  Left great toe: there is mild inflammation around the base of the nail, and the distal nail appears loose with purulent drainage.  Pressed down on the nail and expressed a large amount of fluid, with some blood and pus.  Took a culture.  No heat or redness Right 2nd toe: the nail is nearly avulsed but is still attached at the base and is not easily removed  Culture taken of fluid from left great toe  Assessment and Plan: Paronychia of great toe, left - Plan: WOUND CULTURE, doxycycline (VIBRAMYCIN) 100 MG capsule, Ambulatory referral to Podiatry  Here today with a complicated paronychia of the left great toe and a nearly avulsed nail on the right.  He has DM and poor peripheral sensation.  I feel that he needs to see podiatry to have these nails removed.  Will have him seen tomorrow- start on doxycycline in the meantime    Signed Lamar Blinks, MD

## 2016-10-20 NOTE — Patient Instructions (Signed)
I took a culture of the material from your toe- I will let you know what this shows! Start on the doxycycline antibiotic twice a day for 10 days- take with food and water We will get you in to see podiatry asap If any roadblocks or worsening please let me know

## 2016-10-20 NOTE — Progress Notes (Signed)
Pre visit review using our clinic review tool, if applicable. No additional management support is needed unless otherwise documented below in the visit note. 

## 2016-10-21 ENCOUNTER — Encounter: Payer: Self-pay | Admitting: Family Medicine

## 2016-10-21 ENCOUNTER — Telehealth: Payer: Self-pay | Admitting: Family Medicine

## 2016-10-21 DIAGNOSIS — L02612 Cutaneous abscess of left foot: Secondary | ICD-10-CM | POA: Diagnosis not present

## 2016-10-21 DIAGNOSIS — M79675 Pain in left toe(s): Secondary | ICD-10-CM | POA: Diagnosis not present

## 2016-10-21 DIAGNOSIS — E1140 Type 2 diabetes mellitus with diabetic neuropathy, unspecified: Secondary | ICD-10-CM | POA: Diagnosis not present

## 2016-10-21 DIAGNOSIS — L03032 Cellulitis of left toe: Secondary | ICD-10-CM | POA: Diagnosis not present

## 2016-10-21 DIAGNOSIS — L03031 Cellulitis of right toe: Secondary | ICD-10-CM | POA: Diagnosis not present

## 2016-10-21 NOTE — Telephone Encounter (Signed)
Called pt- they did end up seeing podiatry today They removed both nails He is doing fine

## 2016-10-21 NOTE — Telephone Encounter (Signed)
Caller name: Relationship to patient: Self Can be reached: [phone]  Pharmacy:  Reason for call: FYI: Patient called to inform provider that he could not get in to see Podiatry today because they are overbooked due to a provider being out.

## 2016-10-23 LAB — WOUND CULTURE
Gram Stain: NONE SEEN
Gram Stain: NONE SEEN

## 2016-10-24 ENCOUNTER — Encounter: Payer: Self-pay | Admitting: Family Medicine

## 2016-10-26 ENCOUNTER — Encounter: Payer: Self-pay | Admitting: Family Medicine

## 2016-11-08 ENCOUNTER — Encounter: Payer: Self-pay | Admitting: Family Medicine

## 2016-11-08 DIAGNOSIS — E785 Hyperlipidemia, unspecified: Secondary | ICD-10-CM

## 2016-11-08 DIAGNOSIS — E119 Type 2 diabetes mellitus without complications: Secondary | ICD-10-CM

## 2016-11-08 DIAGNOSIS — E1140 Type 2 diabetes mellitus with diabetic neuropathy, unspecified: Secondary | ICD-10-CM | POA: Diagnosis not present

## 2016-11-08 DIAGNOSIS — L03032 Cellulitis of left toe: Secondary | ICD-10-CM | POA: Diagnosis not present

## 2016-11-08 MED ORDER — LISINOPRIL 10 MG PO TABS: 10.0000 mg | ORAL_TABLET | Freq: Every evening | ORAL | 3 refills | Status: DC

## 2016-11-08 MED ORDER — FENOFIBRATE 145 MG PO TABS
145.0000 mg | ORAL_TABLET | Freq: Every evening | ORAL | 3 refills | Status: DC
Start: 2016-11-08 — End: 2016-11-11

## 2016-11-10 ENCOUNTER — Encounter: Payer: Self-pay | Admitting: Family Medicine

## 2016-11-11 ENCOUNTER — Other Ambulatory Visit: Payer: Self-pay | Admitting: Emergency Medicine

## 2016-11-11 DIAGNOSIS — E785 Hyperlipidemia, unspecified: Secondary | ICD-10-CM

## 2016-11-11 DIAGNOSIS — E119 Type 2 diabetes mellitus without complications: Secondary | ICD-10-CM

## 2016-11-11 MED ORDER — LISINOPRIL 10 MG PO TABS: 10.0000 mg | ORAL_TABLET | Freq: Every evening | ORAL | 3 refills | Status: DC

## 2016-11-11 MED ORDER — FENOFIBRATE 145 MG PO TABS: 145.0000 mg | ORAL_TABLET | Freq: Every evening | ORAL | 3 refills | Status: DC

## 2016-11-17 ENCOUNTER — Other Ambulatory Visit: Payer: Self-pay | Admitting: Family Medicine

## 2016-11-17 DIAGNOSIS — E119 Type 2 diabetes mellitus without complications: Secondary | ICD-10-CM

## 2016-11-30 DIAGNOSIS — M2042 Other hammer toe(s) (acquired), left foot: Secondary | ICD-10-CM | POA: Diagnosis not present

## 2016-11-30 DIAGNOSIS — E1140 Type 2 diabetes mellitus with diabetic neuropathy, unspecified: Secondary | ICD-10-CM | POA: Diagnosis not present

## 2016-12-01 ENCOUNTER — Encounter: Payer: Self-pay | Admitting: Neurology

## 2016-12-01 ENCOUNTER — Ambulatory Visit (INDEPENDENT_AMBULATORY_CARE_PROVIDER_SITE_OTHER): Payer: Medicare Other | Admitting: Neurology

## 2016-12-01 VITALS — BP 123/80 | HR 82 | Resp 20 | Ht 71.0 in | Wt 223.0 lb

## 2016-12-01 DIAGNOSIS — R0683 Snoring: Secondary | ICD-10-CM

## 2016-12-01 DIAGNOSIS — N3944 Nocturnal enuresis: Secondary | ICD-10-CM

## 2016-12-01 DIAGNOSIS — E669 Obesity, unspecified: Secondary | ICD-10-CM

## 2016-12-01 DIAGNOSIS — G4750 Parasomnia, unspecified: Secondary | ICD-10-CM

## 2016-12-01 DIAGNOSIS — R351 Nocturia: Secondary | ICD-10-CM | POA: Diagnosis not present

## 2016-12-01 NOTE — Patient Instructions (Signed)
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 This means, that you have a tendency to act out your dreams in your sleep. The frequency of this problem is highly variable and may range from night to night episodes to going days and weeks without any problems. This condition may result in sleep disruption, and therefore daytime symptoms such as lack of energy, problems focusing and concentrating and daytime sleepiness. It may result in self injury if you flail your arms and legs and even roll out of bed but also injury to your bed partner if you accidentally grab, or punch or hit your bed partner. Making your sleep environment safe for you and your bed partner is important. We do not actually know what causes this condition. It can be at times linked to either neurological conditions, including neuro degenerative diseases such as Parkinson's disease (PD). We do not understand fully how RBD and Parkinson's disease or related and why RBD may occur at times many years before Parkinson's disease develops and some cases. Having RBD certainly does not mean he will go on to develop Parkinson's disease. Nevertheless, there may be up to 40% correlation between RBD and PD.    There are thankfully no obvious signs of parkinsonism on your exam, we will continue to monitor.  Based on your symptoms and your exam I believe you are at risk for obstructive sleep apnea or OSA, and I think we should proceed with a sleep study to determine whether you do or do not have OSA and how severe it is. If you have more than mild OSA, I want you to consider treatment with CPAP. Please remember, the risks and ramifications of moderate to severe obstructive sleep apnea or OSA are: Cardiovascular disease, including congestive heart failure, stroke, difficult to control hypertension, arrhythmias, and even type 2 diabetes has been linked to untreated OSA. Sleep apnea causes disruption of sleep and sleep deprivation in most cases,  which, in turn, can cause recurrent headaches, problems with memory, mood, concentration, focus, and vigilance. Most people with untreated sleep apnea report excessive daytime sleepiness, which can affect their ability to drive. Please do not drive if you feel sleepy.   I will likely see you back after your sleep study to go over the test results and where to go from there. We will call you after your sleep study to advise about the results (most likely, you will hear from Beverlee Nims, my nurse) and to set up an appointment at the time, as necessary.    Our sleep lab administrative assistant, Arrie Aran will meet with you or call you to schedule your sleep study. If you don't hear back from her by next week please feel free to call her at [phone]. This is her direct line and please leave a message with your phone number to call back if you get the voicemail box. She will call back as soon as possible.
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
## 2016-12-14 ENCOUNTER — Ambulatory Visit (INDEPENDENT_AMBULATORY_CARE_PROVIDER_SITE_OTHER): Payer: Medicare Other | Admitting: Neurology

## 2016-12-14 DIAGNOSIS — R9431 Abnormal electrocardiogram [ECG] [EKG]: Secondary | ICD-10-CM

## 2016-12-14 DIAGNOSIS — G4750 Parasomnia, unspecified: Secondary | ICD-10-CM | POA: Diagnosis not present

## 2016-12-14 DIAGNOSIS — G4733 Obstructive sleep apnea (adult) (pediatric): Secondary | ICD-10-CM

## 2016-12-14 DIAGNOSIS — G4761 Periodic limb movement disorder: Secondary | ICD-10-CM

## 2016-12-14 DIAGNOSIS — G4720 Circadian rhythm sleep disorder, unspecified type: Secondary | ICD-10-CM

## 2016-12-19 DIAGNOSIS — N401 Enlarged prostate with lower urinary tract symptoms: Secondary | ICD-10-CM | POA: Diagnosis not present

## 2016-12-19 DIAGNOSIS — R351 Nocturia: Secondary | ICD-10-CM | POA: Diagnosis not present

## 2016-12-19 DIAGNOSIS — R3916 Straining to void: Secondary | ICD-10-CM | POA: Diagnosis not present

## 2016-12-19 DIAGNOSIS — R3912 Poor urinary stream: Secondary | ICD-10-CM | POA: Diagnosis not present

## 2016-12-20 ENCOUNTER — Encounter: Payer: Self-pay | Admitting: Family Medicine

## 2016-12-22 ENCOUNTER — Encounter: Payer: Self-pay | Admitting: Family Medicine

## 2016-12-22 DIAGNOSIS — N320 Bladder-neck obstruction: Secondary | ICD-10-CM

## 2016-12-22 NOTE — Addendum Note (Signed)
Addended by: Star Age on: 12/22/2016 07:02 PM   Modules accepted: Orders

## 2016-12-22 NOTE — Procedures (Signed)
PATIENT'S NAME:  Matthew Nash, Wheatley DOB:      Jan 02, 1936      MR#:    [id number]     DATE OF RECORDING: 12/14/2016 REFERRING M.D.:  Lamar Blinks MD Study Performed:   Baseline Polysomnogram HISTORY:  81 year old man with a history of osteoarthritis, type 2 diabetes, reflux disease, hyperlipidemia, lumbosacral spondylitis with low back pain, diverticulosis, kidney stones, hypertension, peripheral neuropathy, foot drop with bilateral AFO, and obesity, who reports snoring and infrequent episodes of dream enactments for the past 1-1/2 to maybe even 2 years. He has urinary retention, nocturnal enuresis, and having to self-catheterize each afternoon and night before bedtime. The patient endorsed the Epworth Sleepiness Scale at 1/24 points. The patient's weight 223 pounds with a height of 71 (inches), resulting in a BMI of 31.2 kg/m2. The patient's neck circumference measured 17.4 inches.  CURRENT MEDICATIONS: Desmorpression, Doxycycoine, Fenofibrate, L-Methylfoate, Liraglutide, Lisinopril, Metformin, Omeprazole, Simvastatin and Tamsulosin   PROCEDURE:  This is a multichannel digital polysomnogram utilizing the Somnostar 11.2 system.  Electrodes and sensors were applied and monitored per AASM Specifications.   EEG, EOG, Chin and Limb EMG, were sampled at 200 Hz.  ECG, Snore and Nasal Pressure, Thermal Airflow, Respiratory Effort, CPAP Flow and Pressure, Oximetry was sampled at 50 Hz. Digital video and audio were recorded.      BASELINE STUDY  Lights Out was at 22:24 and Lights On at 05:04.  Total recording time (TRT) was 400 minutes, with a total sleep time (TST) of  285 minutes.   The patient's sleep latency was 47.5 minutes, which is prolonged.  REM latency was 203 minutes, which is prolonged.  The sleep efficiency was 71.3 %, which is mildly reduced.     SLEEP ARCHITECTURE: WASO (Wake after sleep onset) was 72 minutes with mild to moderate sleep fragmentation noted.  There were 20 minutes in Stage N1, 221.5  minutes Stage N2, 0 minutes Stage N3 and 43.5 minutes in Stage REM.  The percentage of Stage N1 was 7.%, Stage N2 was 77.7%, which is increased, Stage N3 was absent and Stage R (REM sleep) was 15.3%, which is mildly reduced.   The arousals were noted as: 31 were spontaneous, 20 were associated with PLMs, 81 were associated with respiratory events.    Audio and video analysis did not show any abnormal or unusual movements, behaviors, phonations or vocalizations. In particular, no evidence of acting out of REM sleep was noted, no significant increase in phasic muscle tone during REM sleep noted.  The patient took 1 bathroom break, had one episode of enuresis. Intermittent mild to moderate snoring was noted.The EKG showed occasional PVCs.   RESPIRATORY ANALYSIS:  There were a total of 81 respiratory events:  34 obstructive apneas, 0 central apneas and 0 mixed apneas with a total of 34 apneas and an apnea index (AI) of 7.2 /hour. There were 47 hypopneas with a hypopnea index of 9.9 /hour. The patient also had 0 respiratory event related arousals (RERAs).      The total APNEA/HYPOPNEA INDEX (AHI) was 17.1/hour and the total RESPIRATORY DISTURBANCE INDEX was 17.1 /hour.  21 events occurred in REM sleep and 52 events in NREM. The REM AHI was 29. /hour, versus a non-REM AHI of 14.9. The patient spent 28.5 minutes of total sleep time in the supine position and 257 minutes in non-supine.. The supine AHI was 40.0 versus a non-supine AHI of 14.5.  OXYGEN SATURATION & C02:  The Wake baseline 02 saturation was 94%, with  the lowest being 81%. Time spent below 89% saturation equaled 243 minutes.  PERIODIC LIMB MOVEMENTS:   The patient had a total of 389 Periodic Limb Movements.  The Periodic Limb Movement (PLM) index was 81.9 and the PLM Arousal index was 4.2/hour.  Post-study, the patient indicated that sleep was the same as usual.   IMPRESSION:  1. Obstructive Sleep Apnea (OSA) 2. Periodic Limb Movement  Disorder (PLMD) 3. Nonspecific abnormal EKG 4. Dysfunctions associated with sleep stages or arousal from sleep  RECOMMENDATIONS:  1. This overnight polysomnogram demonstrates moderate to severe obstructive sleep apnea with an AHI of 17.1/hour in total, REM AHI of 29/hour and supine AHI of 40/hour, O2 nadir of 81%. Treatment options include positive airway pressure and given the patient's medical history and sleep related complaints, it is recommended, that he return for a full night CPAP titration study for proper treatment settings and mask fitting. Other treatment options may include: avoidance of the supine sleep position, weight loss, an oral appliance (aka dental device, custom made by a specialized dentist usually), or upper airway or jaw surgery (not usually first line treatments).  2. The patient should be cautioned not to drive, work at heights, or operate dangerous or heavy equipment when tired or sleepy. Review and reiteration of good sleep hygiene measures should be pursued with any patient. 3. Please note that untreated obstructive sleep apnea carries additional perioperative morbidity. Patients with significant obstructive sleep apnea should receive perioperative PAP therapy and the surgeons and particularly the anesthesiologist should be informed of the diagnosis and the severity of the sleep disordered breathing. 4. The study showed occasional PVCs on single lead EKG; clinical correlation is recommended and consultation with cardiology may be feasible.  5. This study shows sleep fragmentation and abnormal sleep stage percentages; these are nonspecific findings and per se do not signify an intrinsic sleep disorder or a cause for the patient's sleep-related symptoms. Causes include (but are not limited to) the first night effect of the sleep study, circadian rhythm disturbances, medication effect or an underlying mood disorder or medical problem.  6. Severe PLMs (periodic limb movements of  sleep) were noted during the study with mild arousals; clinical correlation is recommended.  7. The patient will be seen in follow-up by Dr. Rexene Alberts at [facility] for discussion of the test results and further management strategies. The referring provider will be notified of the test results.   I certify that I have reviewed the entire raw data recording prior to the issuance of this report in accordance with the Standards of Accreditation of the American Academy of Sleep Medicine (AASM)    Star Age, MD, PhD Diplomat, American Board of Psychiatry and Neurology (Neurology and Sleep Medicine)

## 2016-12-22 NOTE — Progress Notes (Signed)
Patient referred by Dr. Lorelei Pont, seen by me on 12/01/16, diagnostic PSG on 12/14/16.    Please call and notify the patient that the recent sleep study did confirm the diagnosis of moderate obstructive sleep apnea and severe leg movements in sleep, and I recommend treatment for his OSA in the form of CPAP. This will require a repeat sleep study for proper titration and mask fitting. Please explain to patient and arrange for a CPAP titration study. I have placed an order in the chart. Thanks, and please route to [facility] for scheduling next sleep study.  Star Age, MD, PhD [facility])

## 2016-12-26 ENCOUNTER — Telehealth: Payer: Self-pay

## 2016-12-26 NOTE — Telephone Encounter (Signed)
-----   Message from Star Age, MD sent at 12/22/2016  7:02 PM EST ----- Patient referred by Dr. Lorelei Pont, seen by me on 12/01/16, diagnostic PSG on 12/14/16.    Please call and notify the patient that the recent sleep study did confirm the diagnosis of moderate obstructive sleep apnea and severe leg movements in sleep, and I recommend treatment for his OSA in the form of CPAP. This will require a repeat sleep study for proper titration and mask fitting. Please explain to patient and arrange for a CPAP titration study. I have placed an order in the chart. Thanks, and please route to [facility] for scheduling next sleep study.  Star Age, MD, PhD [facility])

## 2016-12-26 NOTE — Telephone Encounter (Signed)
I spoke to patient. He does not want to do titration study or start CPAP. I made a f/u appt to see Dr. Rexene Alberts.  Please cancel titration study for now.

## 2016-12-28 ENCOUNTER — Other Ambulatory Visit: Payer: Self-pay | Admitting: Family Medicine

## 2016-12-28 DIAGNOSIS — M1712 Unilateral primary osteoarthritis, left knee: Secondary | ICD-10-CM

## 2017-01-02 ENCOUNTER — Other Ambulatory Visit: Payer: Self-pay | Admitting: Emergency Medicine

## 2017-01-02 DIAGNOSIS — R350 Frequency of micturition: Secondary | ICD-10-CM | POA: Diagnosis not present

## 2017-01-02 DIAGNOSIS — N320 Bladder-neck obstruction: Secondary | ICD-10-CM | POA: Diagnosis not present

## 2017-01-02 DIAGNOSIS — N390 Urinary tract infection, site not specified: Secondary | ICD-10-CM | POA: Diagnosis not present

## 2017-01-02 DIAGNOSIS — N400 Enlarged prostate without lower urinary tract symptoms: Secondary | ICD-10-CM | POA: Diagnosis not present

## 2017-01-02 DIAGNOSIS — R32 Unspecified urinary incontinence: Secondary | ICD-10-CM | POA: Diagnosis not present

## 2017-01-12 ENCOUNTER — Encounter: Payer: Self-pay | Admitting: Neurology

## 2017-01-12 ENCOUNTER — Ambulatory Visit (INDEPENDENT_AMBULATORY_CARE_PROVIDER_SITE_OTHER): Payer: Medicare Other | Admitting: Neurology

## 2017-01-12 VITALS — BP 132/70 | HR 78 | Resp 18 | Ht 71.0 in | Wt 230.0 lb

## 2017-01-12 DIAGNOSIS — G4750 Parasomnia, unspecified: Secondary | ICD-10-CM | POA: Diagnosis not present

## 2017-01-12 DIAGNOSIS — G4752 REM sleep behavior disorder: Secondary | ICD-10-CM | POA: Diagnosis not present

## 2017-01-12 DIAGNOSIS — G4733 Obstructive sleep apnea (adult) (pediatric): Secondary | ICD-10-CM | POA: Diagnosis not present

## 2017-01-12 DIAGNOSIS — G4761 Periodic limb movement disorder: Secondary | ICD-10-CM
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
## 2017-01-12 NOTE — Patient Instructions (Addendum)
For your dream enactments, we will try Melatonin at night before sleep: take 5 mg 2 hours before your bedtime. You can go up to 10 mg if needed. It is over the counter and comes in pill form, chewable form and spray, if you prefer.     For your leg movements in sleep, we may consider medication down the road.   For your moderate obstructive sleep apnea, I want you to consider using a CPAP machine. Please remember, the risks and ramifications of moderate to severe obstructive sleep apnea or OSA are: Cardiovascular disease, including congestive heart failure, stroke, difficult to control hypertension, arrhythmias, and even type 2 diabetes has been linked to untreated OSA. Sleep apnea causes disruption of sleep and sleep deprivation in most cases, which, in turn, can cause recurrent headaches, problems with memory, mood, concentration, focus, and vigilance. Most people with untreated sleep apnea report excessive daytime sleepiness, which can affect their ability to drive. Please do not drive if you feel sleepy.

## 2017-01-16 DIAGNOSIS — R339 Retention of urine, unspecified: Secondary | ICD-10-CM | POA: Diagnosis not present

## 2017-01-17 ENCOUNTER — Telehealth: Payer: Self-pay | Admitting: Family Medicine

## 2017-01-17 MED ORDER — OSELTAMIVIR PHOSPHATE 75 MG PO CAPS: 75.0000 mg | ORAL_CAPSULE | Freq: Every day | ORAL | 0 refills | Status: DC

## 2017-01-17 NOTE — Telephone Encounter (Signed)
Melissa-- please advise in PCP's absence? 

## 2017-01-17 NOTE — Telephone Encounter (Signed)
Relation to [id number] Call back number: [phone]  Pharmacy: [facility] [place], [place], [place]   Reason for call:  Patient states Davonn Hammontree (Spouse) was seen by Debbrah Alar, NP yesterday and patient was dx with the flu, patient requesting Tamiflu, please advise

## 2017-01-17 NOTE — Telephone Encounter (Signed)
Left detailed message on home# and to call if any questions. 

## 2017-01-17 NOTE — Telephone Encounter (Signed)
rx has been sent 

## 2017-01-27 ENCOUNTER — Other Ambulatory Visit: Payer: Self-pay | Admitting: Physician Assistant

## 2017-01-27 DIAGNOSIS — E119 Type 2 diabetes mellitus without complications: Secondary | ICD-10-CM

## 2017-01-27 DIAGNOSIS — R32 Unspecified urinary incontinence: Secondary | ICD-10-CM | POA: Diagnosis not present

## 2017-01-27 DIAGNOSIS — N390 Urinary tract infection, site not specified: Secondary | ICD-10-CM | POA: Diagnosis not present

## 2017-01-27 DIAGNOSIS — R339 Retention of urine, unspecified: Secondary | ICD-10-CM | POA: Diagnosis not present

## 2017-01-28 NOTE — Telephone Encounter (Signed)
Will defer further refills of patient's medications to PCP  

## 2017-02-02 ENCOUNTER — Ambulatory Visit (INDEPENDENT_AMBULATORY_CARE_PROVIDER_SITE_OTHER): Payer: Medicare Other | Admitting: Family Medicine

## 2017-02-02 VITALS — BP 110/68 | Temp 98.1°F | Ht 71.0 in | Wt 232.0 lb

## 2017-02-02 DIAGNOSIS — G63 Polyneuropathy in diseases classified elsewhere: Secondary | ICD-10-CM

## 2017-02-02 DIAGNOSIS — R2689 Other abnormalities of gait and mobility: Secondary | ICD-10-CM | POA: Diagnosis not present

## 2017-02-02 DIAGNOSIS — D62 Acute posthemorrhagic anemia: Secondary | ICD-10-CM

## 2017-02-02 DIAGNOSIS — E119 Type 2 diabetes mellitus without complications: Secondary | ICD-10-CM

## 2017-02-02 DIAGNOSIS — G479 Sleep disorder, unspecified: Secondary | ICD-10-CM | POA: Diagnosis not present

## 2017-02-02 NOTE — Progress Notes (Signed)
[place] [facility] [address] [place], [place] [address] [id number] [phone]  Date:  02/02/2017   Name:  Matthew Nash   DOB:  05-25-1936   MRN:  [id number]  PCP:  Lamar Blinks, MD    Chief Complaint: No chief complaint on file.   History of Present Illness:  Matthew Nash is a 81 y.o. very pleasant male patient who presents with the following:  Last seen by myself in November with a paronychia Also with history of DM, GERD, overflow incont  He had a sleep study and has been told that he would benefit from a CPAP machine.    His neurologist and urologist have recommended that he get this machine- he is still thinking about it but is learning towards following their advice.  His wife is also encouraging him to do this.   He needs to have a CPAP titration which he can schedule with [facility]  Lab Results  Component Value Date   HGBA1C 6.4 09/22/2016   He has noted a fine, bilateral hand tremor for the last few weeks- it can interfere with him turning the pages of his newspaper but otherwise is not very bothersome.   More concerning, admits that he seems to stumble and fall easily.  This has been the case for a couple of years but is getting worse.  He has fallen several times- fortunately he has not gotten hurt.  His wife Matthew Nash is with him today and is concerned.  She is afraid to let him go out alone in case he falls He notes no LOC, but states than if he leans forward such as to clean the litter box he may fall forward.  Otherwise if standing straight up he will tend to fall backwards  He does suffer from peripheral neuropathy but he does not feel that this has gotten any worse He has DM which has been under good control with metformin  He also has controled HTN- in fact his BP is on the low side today BP Readings from Last 3 Encounters:  02/02/17 110/68  01/12/17 132/70  12/01/16 123/80    Lab Results  Component  Value Date   HGBA1C 6.4 09/22/2016     BP Readings from Last 3 Encounters:  02/02/17 110/68  01/12/17 132/70  12/01/16 123/80     Patient Active Problem List   Diagnosis Date Noted  . Peripheral neuropathy ([place]) 10/20/2016  . Bladder outlet obstruction 05/02/2016  . OA (osteoarthritis) of knee 05/27/2015  . Diabetes mellitus type II, controlled ([place]) 11/26/2014  . Gastroesophageal reflux disease without esophagitis 11/26/2014  . Arthritis of both knees 11/26/2014  . Absence of bladder continence 11/26/2014  . Hyperlipidemia LDL goal <100 11/26/2014  . Acute medial meniscal tear 07/16/2014  . Lumbosacral spondylosis without myelopathy 02/17/2014  . Spondylosis, lumbosacral 02/17/2014    Past Medical History:  Diagnosis Date  . Arthritis    both knees  . Back pain at L4-L5 level   . BPH (benign prostatic hyperplasia)    hx s/p turp  . Chronic back pain    spondylosis  . Chronic kidney disease    kidney stones 1979  . DDD (degenerative disc disease), lumbar   . Diabetes mellitus    takes Metformin and Victoza daily-under control  . Diarrhea    x 1 today  . Diverticulitis   . GERD (gastroesophageal reflux disease)    takes Omeprazole daily-under control  .  H/O hiatal hernia   . H/O measles   . Hepatitis    B-with Mono and jaundice 1960  . History of chicken pox   . History of colon polyps   . History of kidney stones   . Hyperlipidemia    takes Simvastatin and Tricor daily-under control  . Hypertension    takes Lisinopril daily-under control  . Joint pain    both knees  . Joint swelling    rt knee  . Melanoma ([place])    right hand;basal cell carcinoma  . Neuropathy ([place])   . Peripheral vascular disease ([place])    diabetic neuropathy  . Pneumonia    hx of  . Prostate infection    currently taking macrobid  . Tinnitus   . Urinary incontinence   . UTI (lower urinary tract infection)     Past Surgical History:  Procedure Laterality Date  . BASAL CELL  CARCINOMA EXCISION    . C5 and T1 bone chip removed   1986  . [place]  . CHOLECYSTECTOMY  1993  . COLONOSCOPY  2011  . cortisone injection    . I&D left knee Left 1958   states 22 times  . KNEE ARTHROSCOPY Right 07/16/2014   Procedure: RIGHT ARTHROSCOPY KNEE WITH Medial and Lateral DEBRIDEMENT and chondroplasty;  Surgeon: Gearlean Alf, MD;  Location: [facility];  Service: Orthopedics;  Laterality: Right;  . LUMBAR LAMINECTOMY/DECOMPRESSION MICRODISCECTOMY Right 02/17/2014   Procedure: RIGHT LUMBAR TWO-THREE LAMINECTOMY;  Surgeon: Charlie Pitter, MD;  Location: [facility];  Service: Neurosurgery;  Laterality: Right;  right   . right shoulder surgery  2012   rotator cuff repair  . SHOULDER ARTHROSCOPY W/ ROTATOR CUFF REPAIR Left NOV 2010  . TONSILLECTOMY  1942  . TOTAL KNEE ARTHROPLASTY Right 05/27/2015   Procedure: RIGHT TOTAL KNEE ARTHROPLASTY;  Surgeon: Gaynelle Arabian, MD;  Location: [facility];  Service: Orthopedics;  Laterality: Right;  . TOTAL KNEE ARTHROPLASTY Left 05/30/2016   Procedure: LEFT TOTAL KNEE ARTHROPLASTY;  Surgeon: Gaynelle Arabian, MD;  Location: [facility];  Service: Orthopedics;  Laterality: Left;  . TRANSURETHRAL RESECTION OF PROSTATE  2006  . VASECTOMY  1983  . WISDOM TOOTH EXTRACTION      Social History  Substance Use Topics  . Smoking status: Former Smoker    Types: Pipe    Quit date: 07/14/1978  . Smokeless tobacco: Never Used     Comment: quit smoking 1979  . Alcohol use Yes     Comment: OCCASIONAL    Family History  Problem Relation Age of Onset  . Heart disease Mother 87    Deceased  . Bladder Cancer Mother   . Prostate cancer Father     Deceased-in 28s  . Healthy Brother   . Skin cancer Brother     "basal cell cancer in the bloodstream"  . Asthma Sister     Deceased  . Healthy Son     #1  . Healthy Daughter     #2  . Obesity Son     #2    Allergies  Allergen Reactions  . Albamycin [Novobiocin] Other (See Comments)    Made me look  like a strawberry   . Penicillins Rash and Other (See Comments)    Has patient had a PCN reaction causing immediate rash, facial/tongue/throat swelling, SOB or lightheadedness with hypotension:  no Has patient had a PCN reaction causing severe rash involving mucus membranes or skin necrosis: no Has patient  had a PCN reaction that required hospitalization no Has patient had a PCN reaction occurring within the last 10 years: no - childhood reaction If all of the above answers are "NO", then may proceed with Cephalosporin use.     Medication list has been reviewed and updated.  Current Outpatient Prescriptions on File Prior to Visit  Medication Sig Dispense Refill  . celecoxib (CELEBREX) 200 MG capsule TAKE 1 CAPSULE TWICE A DAY 180 capsule 2  . fenofibrate (TRICOR) 145 MG tablet Take 1 tablet (145 mg total) by mouth every evening. 90 tablet 3  . FIBER PO Take 3 capsules by mouth 2 (two) times daily.    [name] Kitchen glucose blood (FREESTYLE LITE) test strip Use as instructed to test Blood Glucose twice daily Dx: E11.9 200 each 3  . Insulin Pen Needle (CAREFINE PEN NEEDLES) 31G X 8 MM MISC Use once daily with Victoza injections. 100 each 3  . L-Methylfolate-Algae-B12-B6 (METANX PO) Take by mouth.    . Lancets (FREESTYLE) lancets USE 1 LANCET TWICE A DAY AS INSTRUCTED 200 each 3  . Liraglutide 18 MG/3ML SOPN Inject 0.3 mLs (1.8 mg total) into the skin daily. 27 mL 1  . lisinopril (PRINIVIL,ZESTRIL) 10 MG tablet Take 1 tablet (10 mg total) by mouth every evening. 90 tablet 3  . metFORMIN (GLUCOPHAGE-XR) 500 MG 24 hr tablet TAKE 1 TABLET TWICE A DAY WITH MEALS 180 tablet 1  . omeprazole (PRILOSEC) 20 MG capsule Take 1 capsule (20 mg total) by mouth daily. 90 capsule 3  . oseltamivir (TAMIFLU) 75 MG capsule Take 1 capsule (75 mg total) by mouth daily. 10 capsule 0  . Probiotic Product (PROBIOTIC PO) Take 1 capsule by mouth daily.    . simvastatin (ZOCOR) 10 MG tablet Take 1 tablet (10 mg total) by mouth at  bedtime. 90 tablet 1  . UNABLE TO FIND Med Name: Urivarx    . tamsulosin (FLOMAX) 0.4 MG CAPS capsule Take 1 capsule (0.4 mg total) by mouth daily. (Patient not taking: Reported on 02/02/2017) 90 capsule 3   No current facility-administered medications on file prior to visit.     Review of Systems:  As per HPI- otherwise negative.   Physical Examination: Vitals:   02/02/17 1604  BP: 110/68  Temp: 98.1 F (36.7 C)   Vitals:   02/02/17 1604  Weight: 232 lb (105.2 kg)  Height: 5\' 11"  (1.803 m)   Body mass index is 32.36 kg/m. Ideal Body Weight: Weight in (lb) to have BMI = 25: 178.9  GEN: WDWN, NAD, Non-toxic, A & O x 3, tall, large build,.  Looks well HEENT: Atraumatic, Normocephalic. Neck supple. No masses, No LAD.  Bilateral TM wnl, oropharynx normal.  PEERL,EOMI.   Ears and Nose: No external deformity. CV: RRR, No M/G/R. No JVD. No thrill. No extra heart sounds. PULM: CTA B, no wheezes, crackles, rhonchi. No retractions. No resp. distress. No accessory muscle use. EXTR: No c/c/e NEURO Normal gait.   Very subtle intention tremor of his hands but is nearly invisible.  Did fine on finger to nose testing.  On romberg testing he started to fall over backwards when I caught him PSYCH: Normally interactive. Conversant. Not depressed or anxious appearing.  Calm demeanor.    Assessment and Plan:  Controlled type 2 diabetes mellitus without complication, without long-term current use of insulin ([place]) - Plan: Hemoglobin A1c, Comprehensive metabolic panel  Sleep disturbance  Polyneuropathy associated with underlying disease ([place])  Balance disorder  Acute blood loss  anemia - Plan: CBC   Here today to discus a couple of concerns He does not want to do his A1c today ("too soon after the holidays") but will come back for A1c and CBC soon - need to recheck his hg following his knee surgery Encouraged him to follow through with his CPAP titration Message to his current  neurologist- I am not sure if she only does sleep med or if she can help him with his balance disturbance as well.  Will follow-up with him when I know more He would like to try and decrease medication- he can cut his lisinopril in half   See patient instructions for more details.     Signed Lamar Blinks, MD

## 2017-02-02 NOTE — Patient Instructions (Addendum)
It was very good to see you today- take care and please come in for labs in about 2 months I am concerned about your falls and balance problem- I will touch base with Dr. Rexene Alberts to see if this is something that she can help [place] with. I agree that keeping your phone on you or having a "life alert" button is a good idea Please do plan to have the CPAP titration study- however this is not an emergency and can certainly wait until after Michele's knee operation   It is fine if you want to try taking just 1/2 (5 mg) of your lisinopril for your BP

## 2017-02-07 ENCOUNTER — Encounter: Payer: Self-pay | Admitting: Family Medicine

## 2017-02-08 ENCOUNTER — Encounter: Payer: Self-pay | Admitting: Family Medicine

## 2017-02-08 MED ORDER — GLUCOSE BLOOD VI STRP: ORAL_STRIP | 12 refills | Status: DC

## 2017-02-17 ENCOUNTER — Other Ambulatory Visit: Payer: Self-pay | Admitting: Emergency Medicine

## 2017-02-17 ENCOUNTER — Encounter: Payer: Self-pay | Admitting: Family Medicine

## 2017-02-17 ENCOUNTER — Encounter: Payer: Self-pay | Admitting: Neurology

## 2017-02-17 DIAGNOSIS — E119 Type 2 diabetes mellitus without complications: Secondary | ICD-10-CM

## 2017-02-17 MED ORDER — LISINOPRIL 5 MG PO TABS: 5.0000 mg | ORAL_TABLET | Freq: Every evening | ORAL | 3 refills | Status: DC

## 2017-02-17 MED ORDER — SIMVASTATIN 10 MG PO TABS: 10.0000 mg | ORAL_TABLET | Freq: Every day | ORAL | 1 refills | Status: DC

## 2017-02-22 DIAGNOSIS — N390 Urinary tract infection, site not specified: Secondary | ICD-10-CM | POA: Diagnosis not present

## 2017-02-22 DIAGNOSIS — R32 Unspecified urinary incontinence: Secondary | ICD-10-CM | POA: Diagnosis not present

## 2017-03-01 ENCOUNTER — Other Ambulatory Visit (INDEPENDENT_AMBULATORY_CARE_PROVIDER_SITE_OTHER): Payer: Medicare Other

## 2017-03-01 ENCOUNTER — Encounter: Payer: Self-pay | Admitting: Family Medicine

## 2017-03-01 DIAGNOSIS — M6281 Muscle weakness (generalized): Secondary | ICD-10-CM | POA: Diagnosis not present

## 2017-03-01 DIAGNOSIS — E119 Type 2 diabetes mellitus without complications: Secondary | ICD-10-CM | POA: Diagnosis not present

## 2017-03-01 DIAGNOSIS — D62 Acute posthemorrhagic anemia: Secondary | ICD-10-CM

## 2017-03-01 DIAGNOSIS — M2042 Other hammer toe(s) (acquired), left foot: Secondary | ICD-10-CM | POA: Diagnosis not present

## 2017-03-01 DIAGNOSIS — E1140 Type 2 diabetes mellitus with diabetic neuropathy, unspecified: Secondary | ICD-10-CM | POA: Diagnosis not present

## 2017-03-01 LAB — CBC
HEMATOCRIT: 40.6 % (ref 39.0–52.0)
HEMOGLOBIN: 13.2 g/dL (ref 13.0–17.0)
MCHC: 32.4 g/dL (ref 30.0–36.0)
MCV: 86.6 fl (ref 78.0–100.0)
Platelets: 247 10*3/uL (ref 150.0–400.0)
RBC: 4.69 Mil/uL (ref 4.22–5.81)
RDW: 14.4 % (ref 11.5–15.5)
WBC: 5.8 10*3/uL (ref 4.0–10.5)

## 2017-03-01 LAB — COMPREHENSIVE METABOLIC PANEL
ALK PHOS: 37 U/L — AB (ref 39–117)
ALT: 20 U/L (ref 0–53)
AST: 12 U/L (ref 0–37)
Albumin: 4.1 g/dL (ref 3.5–5.2)
BILIRUBIN TOTAL: 0.5 mg/dL (ref 0.2–1.2)
BUN: 25 mg/dL — ABNORMAL HIGH (ref 6–23)
CALCIUM: 10 mg/dL (ref 8.4–10.5)
CO2: 29 meq/L (ref 19–32)
Chloride: 107 mEq/L (ref 96–112)
Creatinine, Ser: 1.09 mg/dL (ref 0.40–1.50)
GFR: 69.38 mL/min (ref >60.00)
Glucose, Bld: 115 mg/dL — ABNORMAL HIGH (ref 70–99)
POTASSIUM: 4.6 meq/L (ref 3.5–5.1)
Sodium: 141 mEq/L (ref 135–145)
Total Protein: 6.7 g/dL (ref 6.0–8.3)

## 2017-03-01 LAB — HEMOGLOBIN A1C: HEMOGLOBIN A1C: 6.6 % — AB (ref 4.6–6.5)

## 2017-03-03 ENCOUNTER — Telehealth: Payer: Self-pay | Admitting: Family Medicine

## 2017-03-03 NOTE — Telephone Encounter (Signed)
Called patient to schedule awv. Patient's phone is currently not working. Will attempt to call patient at a later time.

## 2017-03-06 ENCOUNTER — Telehealth: Payer: Self-pay | Admitting: Neurology

## 2017-03-06 DIAGNOSIS — R9431 Abnormal electrocardiogram [ECG] [EKG]: Secondary | ICD-10-CM

## 2017-03-06 DIAGNOSIS — G4733 Obstructive sleep apnea (adult) (pediatric): Secondary | ICD-10-CM

## 2017-03-06 DIAGNOSIS — G4761 Periodic limb movement disorder: Secondary | ICD-10-CM

## 2017-03-06 DIAGNOSIS — G4720 Circadian rhythm sleep disorder, unspecified type: Secondary | ICD-10-CM

## 2017-03-06 NOTE — Telephone Encounter (Signed)
Patient called ready to schedule his CPAP titration and I don't see an order for one.  I did schedule an appointment but I need an order to attach.  Thank you

## 2017-03-06 NOTE — Telephone Encounter (Signed)
Ordered cpap study on 12/22/16, but reordered today, showed up as duplicated for me. Don't know, why it didn't show show up for Dawn, but was visible under procedures for me. Don't know how to route orders.

## 2017-03-08 DIAGNOSIS — N3941 Urge incontinence: Secondary | ICD-10-CM | POA: Diagnosis not present

## 2017-03-08 DIAGNOSIS — R32 Unspecified urinary incontinence: Secondary | ICD-10-CM | POA: Diagnosis not present

## 2017-03-10 ENCOUNTER — Encounter: Payer: Self-pay | Admitting: Family Medicine

## 2017-03-12 ENCOUNTER — Ambulatory Visit (INDEPENDENT_AMBULATORY_CARE_PROVIDER_SITE_OTHER): Payer: Medicare Other | Admitting: Neurology

## 2017-03-12 DIAGNOSIS — G4733 Obstructive sleep apnea (adult) (pediatric): Secondary | ICD-10-CM | POA: Diagnosis not present

## 2017-03-12 DIAGNOSIS — G4761 Periodic limb movement disorder: Secondary | ICD-10-CM

## 2017-03-12 DIAGNOSIS — G4720 Circadian rhythm sleep disorder, unspecified type: Secondary | ICD-10-CM

## 2017-03-12 DIAGNOSIS — R9431 Abnormal electrocardiogram [ECG] [EKG]: Secondary | ICD-10-CM

## 2017-03-21 ENCOUNTER — Telehealth: Payer: Self-pay

## 2017-03-21 ENCOUNTER — Other Ambulatory Visit: Payer: Self-pay | Admitting: Family Medicine

## 2017-03-21 NOTE — Addendum Note (Signed)
Addended by: Star Age on: 03/21/2017 08:11 AM   Modules accepted: Orders

## 2017-03-21 NOTE — Telephone Encounter (Signed)
-----   Message from Star Age, MD sent at 03/21/2017  8:11 AM EDT ----- Patient referred by Dr. Lorelei Pont, seen by me on 12/01/16, diagnostic PSG on 12/14/16, cpap study on 03/12/17.   Please call and inform patient that I have entered an order for treatment with positive airway pressure (PAP) treatment of obstructive sleep apnea (OSA). He did well during the latest sleep study with CPAP. We will, therefore, arrange for a machine for home use through a DME (durable medical equipment) company of His choice; and I will see the patient back in follow-up in about 10 weeks. Please also explain to the patient that I will be looking out for compliance data, which can be downloaded from the machine (stored on an SD card, that is inserted in the machine) or via remote access through a modem, that is built into the machine. At the time of the followup appointment we will discuss sleep study results and how it is going with PAP treatment at home. Please advise patient to bring His machine at the time of the first FU visit, even though this is cumbersome. Bringing the machine for every visit after that will likely not be needed, but often helps for the first visit to troubleshoot if needed. Please re-enforce the importance of compliance with treatment and the need for [place] to monitor compliance data - often an insurance requirement and actually good feedback for the patient as far as how they are doing.  Also remind patient, that any interim PAP machine or mask issues should be first addressed with the DME company, as they can often help better with technical and mask fit issues. Please ask if patient has a preference regarding DME company.  Please also make sure, the patient has a follow-up appointment with me in about 10 weeks from the setup date, thanks.  Once you have spoken to the patient - and faxed/routed report to PCP and referring MD (if other than PCP), you can close this encounter, thanks,   Star Age, MD,  PhD [facility] ([place])

## 2017-03-21 NOTE — Progress Notes (Signed)
Patient referred by Dr. Lorelei Pont, seen by me on 12/01/16, diagnostic PSG on 12/14/16, cpap study on 03/12/17.   Please call and inform patient that I have entered an order for treatment with positive airway pressure (PAP) treatment of obstructive sleep apnea (OSA). He did well during the latest sleep study with CPAP. We will, therefore, arrange for a machine for home use through a DME (durable medical equipment) company of His choice; and I will see the patient back in follow-up in about 10 weeks. Please also explain to the patient that I will be looking out for compliance data, which can be downloaded from the machine (stored on an SD card, that is inserted in the machine) or via remote access through a modem, that is built into the machine. At the time of the followup appointment we will discuss sleep study results and how it is going with PAP treatment at home. Please advise patient to bring His machine at the time of the first FU visit, even though this is cumbersome. Bringing the machine for every visit after that will likely not be needed, but often helps for the first visit to troubleshoot if needed. Please re-enforce the importance of compliance with treatment and the need for [place] to monitor compliance data - often an insurance requirement and actually good feedback for the patient as far as how they are doing.  Also remind patient, that any interim PAP machine or mask issues should be first addressed with the DME company, as they can often help better with technical and mask fit issues. Please ask if patient has a preference regarding DME company.  Please also make sure, the patient has a follow-up appointment with me in about 10 weeks from the setup date, thanks.  Once you have spoken to the patient - and faxed/routed report to PCP and referring MD (if other than PCP), you can close this encounter, thanks,   Star Age, MD, PhD [facility] ([place])

## 2017-03-21 NOTE — Telephone Encounter (Signed)
I called pt. I advised pt that Dr. Rexene Alberts reviewed their sleep study results and found that pt did well on cpap. Dr. Rexene Alberts recommends that pt start a cpap at home. I reviewed PAP compliance expectations with the pt. Pt is agreeable to starting a CPAP. I advised pt that an order will be sent to a DME, [facility], per pt's request, and [facility] will call the pt within about one week after they file with the pt's insurance. [facility] will show the pt how to use the machine, fit for masks, and troubleshoot the CPAP if needed. A follow up appt was made for insurance purposes with Dr. Rexene Alberts on June 27th, 2018 at 11:30am. Pt has an appt on 04/12/2017 to discuss balance with Dr. Rexene Alberts as well. Pt verbalized understanding to arrive 15 minutes early and bring their CPAP. A letter with all of this information in it will be mailed to the pt as a reminder. I verified with the pt that the address we have on file is correct. I advised pt that Dr. Lorelei Pont will be sent a copy of these results. Pt verbalized understanding of results. Pt had no questions at this time but was encouraged to call back if questions arise.

## 2017-03-21 NOTE — Procedures (Signed)
PATIENT'S NAME:  Matthew Nash, Eichel DOB:      04-Jan-1936      MR#:    [id number]     DATE OF RECORDING: 03/12/2017 REFERRING M.D.:  Lamar Blinks MD Study Performed:   CPAP  Titration HISTORY: 81 year old right-handed gentleman with an underlying medical history of osteoarthritis, type 2 diabetes, reflux disease, hyperlipidemia, lumbosacral spondylosis with low back pain, diverticulosis, kidney stones, hypertension, peripheral neuropathy, foot drop with bilateral AFO, and obesity, who returns for full night CPAP titration to treat his OSA. His baseline PSG from 12/14/16 showed moderate to severe obstructive sleep apnea with an AHI of 17.1/hour in total, REM AHI of 29/hour and supine AHI of 40/hour, O2 nadir of 81%. The patient's weight 230 pounds with a height of 71 (inches), resulting in a BMI of 32.1 kg/m2. The patient's neck circumference measured 17.5 inches.  CURRENT MEDICATIONS: Desmorpression, Doxycycoine, Fenofibrate, L-Methylfoate, Liraglutide, Lisinopril, Metformin, Omeprazole, Simvastatin and Tamsulosin  PROCEDURE:  This is a multichannel digital polysomnogram utilizing the SomnoStar 11.2 system.  Electrodes and sensors were applied and monitored per AASM Specifications.   EEG, EOG, Chin and Limb EMG, were sampled at 200 Hz.  ECG, Snore and Nasal Pressure, Thermal Airflow, Respiratory Effort, CPAP Flow and Pressure, Oximetry was sampled at 50 Hz. Digital video and audio were recorded.      The patient was fitted with a medium Simplus FFM due to mouth venting noted in REM sleep. First interface tried were nasal pillows.  CPAP was initiated at 5 cmH20 with heated humidity per AASM standards and pressure was advanced to 15 cmH20 because of hypopneas, apneas and desaturations and snoring.  At a PAP pressure of 15 cmH20, there was a reduction of the AHI to 0/hour with supine REM sleep achieved and O2 nadir of 89%.   Lights Out was at 21:13 and Lights On at 05:22. Total recording time (TRT) was 489  minutes, with a total sleep time (TST) of 393 minutes. The patient's sleep latency was 19 minutes, REM latency was 92.5 minutes, which is normal.  The sleep efficiency was 80.4 %.    SLEEP ARCHITECTURE: WASO (Wake after sleep onset)  was 86 minutes with moderate sleep fragmentation noted.  There were 42 minutes in Stage N1, 246.5 minutes Stage N2, 0 minutes Stage N3 and 104.5 minutes in Stage REM.  The percentage of Stage N1 was 10.7%, which is increased, Stage N2 was 62.7%, which is increased, Stage N3 was absent and Stage R (REM sleep) was 26.6%, which is mildly increased.   The arousals were noted as: 25 were spontaneous, 45 were associated with PLMs, 161 were associated with respiratory events.  Audio and video analysis did not show any abnormal or unusual movements, behaviors, phonations or vocalizations.  The patient took 1 bathroom break. The EKG showed occasional PVCs.   RESPIRATORY ANALYSIS:  There was a total of 161 respiratory events: 0 obstructive apneas, 0 central apneas and 0 mixed apneas with a total of 0 apneas and an apnea index (AI) of 0 /hour. There were 161 hypopneas with a hypopnea index of 24.6/hour. The patient also had 0 respiratory event related arousals (RERAs).      The total APNEA/HYPOPNEA INDEX  (AHI) was 24.6 /hour and the total RESPIRATORY DISTURBANCE INDEX was 24.6 .hour  17 events occurred in REM sleep and 144 events in NREM. The REM AHI was 9.8 /hour versus a non-REM AHI of 29.9 /hour.  The patient spent 323 minutes of total sleep time in  the supine position and 70 minutes in non-supine. The supine AHI was 25.4, versus a non-supine AHI of 20.6.  OXYGEN SATURATION & C02:  The baseline 02 saturation was 89%, with the lowest being 83%. Time spent below 89% saturation equaled 147 minutes.  PERIODIC LIMB MOVEMENTS: The patient had a total of 325 Periodic Limb Movements. The Periodic Limb Movement (PLM) index was 49.6 and the PLM Arousal index was 6.9 /hour.     Post-study, the patient indicated that sleep was the same as usual.   DIAGNOSIS 1. Obstructive Sleep Apnea  2. Periodic Limb Movement Disorder  3. Non-specific abnormal EKG   4. Dysfunctions associated with sleep stages or arousal from sleep    PLANS/RECOMMENDATIONS: 1. This study demonstrates resolution of the patient's obstructive sleep apnea with CPAP therapy. I will, therefore, start the patient on home CPAP treatment at a pressure of 15 cm via medium FFM with heated humidity. The patient should be reminded to be fully compliant with PAP therapy to improve sleep related symptoms and decrease long term cardiovascular risks. The patient should be reminded, that it may take up to 3 months to get fully used to using PAP with all planned sleep. The earlier full compliance is achieved, the better long term compliance tends to be. Please note that untreated obstructive sleep apnea carries additional perioperative morbidity. Patients with significant obstructive sleep apnea should receive perioperative PAP therapy and the surgeons and particularly the anesthesiologist should be informed of the diagnosis and the severity of the sleep disordered breathing. 2. The patient should be cautioned not to drive, work at heights, or operate dangerous or heavy equipment when tired or sleepy. Review and reiteration of good sleep hygiene measures should be pursued with any patient. 3. The study showed occasional PVCs on single lead EKG; clinical correlation is recommended and consultation with cardiology may be feasible.  4. This study shows sleep fragmentation and abnormal sleep stage percentages; these are nonspecific findings and per se do not signify an intrinsic sleep disorder or a cause for the patient's sleep-related symptoms. Causes include (but are not limited to) the first night effect of the sleep study, circadian rhythm disturbances, medication effect or an underlying mood disorder or medical problem.  5.  Severe PLMs (periodic limb movements of sleep) were noted during the study with mild arousals; clinical correlation is recommended.  6. The patient will be seen in follow-up by Dr. Rexene Alberts at [facility] for discussion of the test results and further management strategies. The referring provider will be notified of the test results.      I certify that I have reviewed the entire raw data recording prior to the issuance of this report in accordance with the Standards of Accreditation of the American Academy of Sleep Medicine (AASM)       Star Age, MD, PhD Diplomat, American Board of Psychiatry and Neurology (Neurology and Sleep Medicine)

## 2017-03-22 DIAGNOSIS — N3941 Urge incontinence: Secondary | ICD-10-CM | POA: Diagnosis not present

## 2017-03-22 HISTORY — PX: BOTOX INJECTION: SHX5754

## 2017-03-29 LAB — HM DIABETES EYE EXAM

## 2017-04-03 ENCOUNTER — Telehealth: Payer: Self-pay | Admitting: Neurology

## 2017-04-03 NOTE — Telephone Encounter (Signed)
Pt said he is having problems with the mask. Please call

## 2017-04-03 NOTE — Telephone Encounter (Signed)
His AHI for 7 days of usage is 13/hour, and leak too high. Best thing to do is meet with [facility]. Please encourage patient to do that next, too early to make pressure adjustment, likely mask related.

## 2017-04-03 NOTE — Telephone Encounter (Signed)
I called pt. He advises me that he has tightened the straps on his mask as tight as they will go, but when his cpap reaches 15 cm H2O, it "blows off my face." He was advised to call [facility] to discuss the possibility of a new mask, but he wanted to know if Dr. Rexene Alberts could reduce the pressure on the cpap. Pt was just set up on cpap on April 16th, 2018, so there is only 7 days worth of data to review. Printed therapy report for Dr. Rexene Alberts.

## 2017-04-04 NOTE — Telephone Encounter (Signed)
I called pt. I advised him that the leak is high and the best thing to do is meet with [facility] to discuss a new mask since it is too early to make a pressure adjustment. Pt is agreeable to this and has called [facility].

## 2017-04-06 DIAGNOSIS — R32 Unspecified urinary incontinence: Secondary | ICD-10-CM | POA: Diagnosis not present

## 2017-04-07 NOTE — Progress Notes (Signed)
Pre visit review using our clinic review tool, if applicable. No additional management support is needed unless otherwise documented below in the visit note. 

## 2017-04-07 NOTE — Progress Notes (Signed)
Subjective:   Matthew Nash is a 81 y.o. male who presents for Medicare Annual/Subsequent preventive examination.  Review of Systems:  No ROS.  Medicare Wellness Visit. Cardiac Risk Factors include: advanced age (>51men, >4 women);diabetes mellitus;male gender;sedentary lifestyle Sleep patterns: Wears CPAP.  Sleeps 6-8 hrs/night. Home Safety/Smoke Alarms:  Feels safe in home. Smoke alarms in place.  Living environment; residence and Firearm Safety: Lives with wife. Guns not discussed. Seat Belt Safety/Bike Helmet: Wears seat belt.   Counseling:   Eye Exam- 03/29/17.Wears trifocals.Dr.Jones @[facility]. Dr.Beavis for cataracts. Dental-Dr.Hartzell every 6 months.  Male:   CCS- pt declines PSA-  Lab Results  Component Value Date   PSA 1.36 02/15/2016        Objective:    Vitals: BP 122/76 (BP Location: Right Arm, Patient Position: Sitting, Cuff Size: Normal)   Pulse 75   Ht 5\' 11"  (1.803 m)   Wt 229 lb (103.9 kg)   SpO2 96%   BMI 31.94 kg/m   Body mass index is 31.94 kg/m.  Tobacco History  Smoking Status  . Former Smoker  . Types: Pipe  . Quit date: 07/14/1978  Smokeless Tobacco  . Never Used    Comment: quit smoking 1979     Counseling given: No   Past Medical History:  Diagnosis Date  . Arthritis    both knees  . Back pain at L4-L5 level   . BPH (benign prostatic hyperplasia)    hx s/p turp  . Chronic back pain    spondylosis  . Chronic kidney disease    kidney stones 1979  . DDD (degenerative disc disease), lumbar   . Diabetes mellitus    takes Metformin and Victoza daily-under control  . Diarrhea    x 1 today  . Diverticulitis   . GERD (gastroesophageal reflux disease)    takes Omeprazole daily-under control  . H/O hiatal hernia   . H/O measles   . Hepatitis    B-with Mono and jaundice 1960  . History of chicken pox   . History of colon polyps   . History of kidney stones   . Hyperlipidemia    takes Simvastatin and Tricor daily-under  control  . Hypertension    takes Lisinopril daily-under control  . Joint pain    both knees  . Joint swelling    rt knee  . Melanoma ([place])    right hand;basal cell carcinoma  . Neuropathy   . Peripheral vascular disease ([place])    diabetic neuropathy  . Pneumonia    hx of  . Prostate infection    currently taking macrobid  . Tinnitus   . Urinary incontinence   . UTI (lower urinary tract infection)    Past Surgical History:  Procedure Laterality Date  . BASAL CELL CARCINOMA EXCISION    . BOTOX INJECTION  03/22/2017   In the bladder for nightly incontinence.  . C5 and T1 bone chip removed   1986  . [place]  . CHOLECYSTECTOMY  1993  . COLONOSCOPY  2011  . cortisone injection    . EYE SURGERY     cataract sx 2017 bilateral  . I&D left knee Left 1958   states 22 times  . KNEE ARTHROSCOPY Right 07/16/2014   Procedure: RIGHT ARTHROSCOPY KNEE WITH Medial and Lateral DEBRIDEMENT and chondroplasty;  Surgeon: Gearlean Alf, MD;  Location: [facility];  Service: Orthopedics;  Laterality: Right;  . LUMBAR LAMINECTOMY/DECOMPRESSION MICRODISCECTOMY Right 02/17/2014  Procedure: RIGHT LUMBAR TWO-THREE LAMINECTOMY;  Surgeon: Charlie Pitter, MD;  Location: [facility];  Service: Neurosurgery;  Laterality: Right;  right   . right shoulder surgery  2012   rotator cuff repair  . SHOULDER ARTHROSCOPY W/ ROTATOR CUFF REPAIR Left NOV 2010  . TONSILLECTOMY  1942  . TOTAL KNEE ARTHROPLASTY Right 05/27/2015   Procedure: RIGHT TOTAL KNEE ARTHROPLASTY;  Surgeon: Gaynelle Arabian, MD;  Location: [facility];  Service: Orthopedics;  Laterality: Right;  . TOTAL KNEE ARTHROPLASTY Left 05/30/2016   Procedure: LEFT TOTAL KNEE ARTHROPLASTY;  Surgeon: Gaynelle Arabian, MD;  Location: [facility];  Service: Orthopedics;  Laterality: Left;  . TRANSURETHRAL RESECTION OF PROSTATE  2006  . VASECTOMY  1983  . WISDOM TOOTH EXTRACTION     Family History  Problem Relation Age of Onset  . Heart disease Mother 27     Deceased  . Bladder Cancer Mother   . Prostate cancer Father     Deceased-in 73s  . Healthy Brother   . Skin cancer Brother     "basal cell cancer in the bloodstream"  . Asthma Sister     Deceased  . Healthy Son     #1  . Healthy Daughter     #2  . Obesity Son     #2   History  Sexual Activity  . Sexual activity: No    Outpatient Encounter Prescriptions as of 04/10/2017  Medication Sig  . celecoxib (CELEBREX) 200 MG capsule TAKE 1 CAPSULE TWICE A DAY  . cephALEXin (KEFLEX) 500 MG capsule   . fenofibrate (TRICOR) 145 MG tablet Take 1 tablet (145 mg total) by mouth every evening.  [name] Kitchen FIBER PO Take 3 capsules by mouth 2 (two) times daily.  [name] Kitchen glucose blood (FREESTYLE LITE) test strip Use as instructed to test Blood Glucose twice daily Dx: E11.9  . Insulin Pen Needle (CAREFINE PEN NEEDLES) 31G X 8 MM MISC Use once daily with Victoza injections.  [name] Kitchen L-Methylfolate-Algae-B12-B6 (METANX PO) Take by mouth.  . Lancets (FREESTYLE) lancets USE 1 LANCET TWICE A DAY AS INSTRUCTED  . lisinopril (PRINIVIL,ZESTRIL) 5 MG tablet Take 1 tablet (5 mg total) by mouth every evening.  . metFORMIN (GLUCOPHAGE-XR) 500 MG 24 hr tablet TAKE 1 TABLET TWICE A DAY WITH MEALS  . omeprazole (PRILOSEC) 20 MG capsule Take 1 capsule (20 mg total) by mouth daily.  . Probiotic Product (PROBIOTIC PO) Take 1 capsule by mouth daily.  . simvastatin (ZOCOR) 10 MG tablet Take 1 tablet (10 mg total) by mouth at bedtime.  [name] Kitchen VICTOZA 18 MG/3ML SOPN INJECT 1.8 MG (0.3 ML) UNDER THE SKIN DAILY  . tamsulosin (FLOMAX) 0.4 MG CAPS capsule Take 1 capsule (0.4 mg total) by mouth daily. (Patient not taking: Reported on 02/02/2017)  . UNABLE TO FIND Med Name: Urivarx   No facility-administered encounter medications on file as of 04/10/2017.     Activities of Daily Living In your present state of health, do you have any difficulty performing the following activities: 04/10/2017 05/30/2016  Hearing? N Y  Vision? N N  Difficulty  concentrating or making decisions? N N  Walking or climbing stairs? Y Y  Dressing or bathing? N N  Doing errands, shopping? N N  Preparing Food and eating ? N -  Using the Toilet? N -  In the past six months, have you accidently leaked urine? Y -  Do you have problems with loss of bowel control? N -  Managing your Medications? N -  Managing your Finances? N -  Housekeeping or managing your Housekeeping? N -  Some recent data might be hidden    Patient Care Team: Darreld Mclean, MD as PCP - General (Family Medicine) Gaynelle Arabian, MD as Consulting Physician (Orthopedic Surgery) Deirdre Pippins, PA-C as Physician Assistant (Dermatology) Laurence Aly, OD (Optometry) Carolan Clines, MD as Consulting Physician (Urology)   Assessment:    Physical assessment deferred to PCP.  Exercise Activities and Dietary recommendations Current Exercise Habits: The patient does not participate in regular exercise at present, Exercise limited by: orthopedic condition(s)   Diet (meal preparation, eat out, water intake, caffeinated beverages, dairy products, fruits and vegetables): in general, a "healthy" diet       Goals    . Continue fishing      Fall Risk Fall Risk  04/10/2017 05/18/2016 02/15/2016 03/20/2015  Falls in the past year? Yes No Yes Yes  Number falls in past yr: 1 - 1 2 or more  Injury with Fall? No - Yes No  Risk Factor Category  - - High Fall Risk -  Risk for fall due to : Impaired balance/gait - Impaired balance/gait;Impaired vision Impaired balance/gait  Risk for fall due to (comments): - - Up to use the bathroom lost balance -  Follow up Education provided;Falls prevention discussed - Falls prevention discussed -   Depression Screen PHQ 2/9 Scores 04/10/2017 05/18/2016 02/15/2016  PHQ - 2 Score 0 0 0  Exception Documentation - - Patient refusal    Cognitive Function Ad8 score reviewed for issues:  Issues making decisions:NO  Less interest in hobbies /  activities:no  Repeats questions, stories (family complaining):no  Trouble using ordinary gadgets (microwave, computer, phone):no  Forgets the month or year: no  Mismanaging finances: no  Remembering appts:no  Daily problems with thinking and/or memory:no Ad8 score is=0            Immunization History  Administered Date(s) Administered  . Influenza, High Dose Seasonal PF 09/01/2016  . Influenza,inj,Quad PF,36+ Mos 08/20/2015  . Influenza-Unspecified 09/11/2014  . Pneumococcal Conjugate-13 05/13/2015  . Pneumococcal Polysaccharide-23 12/12/1998  . Td 02/15/2016   Screening Tests Health Maintenance  Topic Date Due  . PNA vac Low Risk Adult (2 of 2 - PPSV23) 05/12/2016  . INFLUENZA VACCINE  07/12/2017  . FOOT EXAM  07/12/2017  . HEMOGLOBIN A1C  09/01/2017  . OPHTHALMOLOGY EXAM  03/29/2018  . TETANUS/TDAP  02/14/2026      Plan:     Follow up with PCP as directed.  Continue to eat heart healthy diet (full of fruits, vegetables, whole grains, lean protein, water--limit salt, fat, and sugar intake) and increase physical activity as tolerated.  Continue doing brain stimulating activities (puzzles, reading, adult coloring books, staying active) to keep memory sharp.    I have personally reviewed and noted the following in the patient's chart:   . Medical and social history . Use of alcohol, tobacco or illicit drugs  . Current medications and supplements . Functional ability and status . Nutritional status . Physical activity . Advanced directives . List of other physicians . Vitals . Screenings to include cognitive, depression, and falls . Referrals and appointments  In addition, I have reviewed and discussed with patient certain preventive protocols, quality metrics, and best practice recommendations. A written personalized care plan for preventive services as well as general preventive health recommendations were provided to patient.     Naaman Plummer  [place], [place]  04/10/2017

## 2017-04-10 ENCOUNTER — Encounter: Payer: Self-pay | Admitting: *Deleted

## 2017-04-10 ENCOUNTER — Ambulatory Visit (INDEPENDENT_AMBULATORY_CARE_PROVIDER_SITE_OTHER): Payer: Medicare Other | Admitting: *Deleted

## 2017-04-10 VITALS — BP 122/76 | HR 75 | Ht 71.0 in | Wt 229.0 lb

## 2017-04-10 DIAGNOSIS — Z0000 Encounter for general adult medical examination without abnormal findings: Secondary | ICD-10-CM

## 2017-04-10 NOTE — Patient Instructions (Signed)
Matthew Nash , Thank you for taking time to come for your Medicare Wellness Visit. I appreciate your ongoing commitment to your health goals. Please review the following plan we discussed and let me know if I can assist you in the future.   Continue to eat heart healthy diet (full of fruits, vegetables, whole grains, lean protein, water--limit salt, fat, and sugar intake) and increase physical activity as tolerated.  Continue doing brain stimulating activities (puzzles, reading, adult coloring books, staying active) to keep memory sharp.   Health Maintenance, Male A healthy lifestyle and preventive care is important for your health and wellness. Ask your health care provider about what schedule of regular examinations is right for you. What should I know about weight and diet?  Eat a Healthy Diet  Eat plenty of vegetables, fruits, whole grains, low-fat dairy products, and lean protein.  Do not eat a lot of foods high in solid fats, added sugars, or salt. Maintain a Healthy Weight  Regular exercise can help you achieve or maintain a healthy weight. You should:  Do at least 150 minutes of exercise each week. The exercise should increase your heart rate and make you sweat (moderate-intensity exercise).  Do strength-training exercises at least twice a week. Watch Your Levels of Cholesterol and Blood Lipids  Have your blood tested for lipids and cholesterol every 5 years starting at 79 years of age. If you are at high risk for heart disease, you should start having your blood tested when you are 79 years old. You may need to have your cholesterol levels checked more often if:  Your lipid or cholesterol levels are high.  You are older than 79 years of age.  You are at high risk for heart disease. What should I know about cancer screening? Many types of cancers can be detected early and may often be prevented. Lung Cancer  You should be screened every year for lung cancer if:  You are a  current smoker who has smoked for at least 30 years.  You are a former smoker who has quit within the past 15 years.  Talk to your health care provider about your screening options, when you should start screening, and how often you should be screened. Colorectal Cancer  Routine colorectal cancer screening usually begins at 79 years of age and should be repeated every 5-10 years until you are 79 years old. You may need to be screened more often if early forms of precancerous polyps or small growths are found. Your health care provider may recommend screening at an earlier age if you have risk factors for colon cancer.  Your health care provider may recommend using home test kits to check for hidden blood in the stool.  A small camera at the end of a tube can be used to examine your colon (sigmoidoscopy or colonoscopy). This checks for the earliest forms of colorectal cancer. Prostate and Testicular Cancer  Depending on your age and overall health, your health care provider may do certain tests to screen for prostate and testicular cancer.  Talk to your health care provider about any symptoms or concerns you have about testicular or prostate cancer. Skin Cancer  Check your skin from head to toe regularly.  Tell your health care provider about any new moles or changes in moles, especially if:  There is a change in a mole's size, shape, or color.  You have a mole that is larger than a pencil eraser.  Always use sunscreen. Apply  sunscreen liberally and repeat throughout the day.  Protect yourself by wearing long sleeves, pants, a wide-brimmed hat, and sunglasses when outside. What should I know about heart disease, diabetes, and high blood pressure?  If you are 97-30 years of age, have your blood pressure checked every 3-5 years. If you are 20 years of age or older, have your blood pressure checked every year. You should have your blood pressure measured twice-once when you are at a  hospital or clinic, and once when you are not at a hospital or clinic. Record the average of the two measurements. To check your blood pressure when you are not at a hospital or clinic, you can use:  An automated blood pressure machine at a pharmacy.  A home blood pressure monitor.  Talk to your health care provider about your target blood pressure.  If you are between 64-36 years old, ask your health care provider if you should take aspirin to prevent heart disease.  Have regular diabetes screenings by checking your fasting blood sugar level.  If you are at a normal weight and have a low risk for diabetes, have this test once every three years after the age of 21.  If you are overweight and have a high risk for diabetes, consider being tested at a younger age or more often.  A one-time screening for abdominal aortic aneurysm (AAA) by ultrasound is recommended for men aged 63-75 years who are current or former smokers. What should I know about preventing infection? Hepatitis B  If you have a higher risk for hepatitis B, you should be screened for this virus. Talk with your health care provider to find out if you are at risk for hepatitis B infection. Hepatitis C  Blood testing is recommended for:  Everyone born from [birth year] through 1963.  Anyone with known risk factors for hepatitis C. Sexually Transmitted Diseases (STDs)  You should be screened each year for STDs including gonorrhea and chlamydia if:  You are sexually active and are younger than 79 years of age.  You are older than 79 years of age and your health care provider tells you that you are at risk for this type of infection.  Your sexual activity has changed since you were last screened and you are at an increased risk for chlamydia or gonorrhea. Ask your health care provider if you are at risk.  Talk with your health care provider about whether you are at high risk of being infected with HIV. Your health care provider may  recommend a prescription medicine to help prevent HIV infection. What else can I do?  Schedule regular health, dental, and eye exams.  Stay current with your vaccines (immunizations).  Do not use any tobacco products, such as cigarettes, chewing tobacco, and e-cigarettes. If you need help quitting, ask your health care provider.  Limit alcohol intake to no more than 2 drinks per day. One drink equals 12 ounces of beer, 5 ounces of wine, or 1 ounces of hard liquor.  Do not use street drugs.  Do not share needles.  Ask your health care provider for help if you need support or information about quitting drugs.  Tell your health care provider if you often feel depressed.  Tell your health care provider if you have ever been abused or do not feel safe at home. This information is not intended to replace advice given to you by your health care provider. Make sure you discuss any questions you have with your  health care provider. Document Released: 05/26/2008 Document Revised: 07/27/2016 Document Reviewed: 09/01/2015 Elsevier Interactive Patient Education  2017 [employer].   These are the goals we discussed: Goals    . Continue fishing       This is a list of the screening recommended for you and due dates:  Health Maintenance  Topic Date Due  . Pneumonia vaccines (2 of 2 - PPSV23) 05/12/2016  . Flu Shot  07/12/2017  . Complete foot exam   07/12/2017  . Hemoglobin A1C  09/01/2017  . Eye exam for diabetics  03/29/2018  . Tetanus Vaccine  02/14/2026

## 2017-04-12 ENCOUNTER — Encounter: Payer: Self-pay | Admitting: Neurology

## 2017-04-12 ENCOUNTER — Ambulatory Visit (INDEPENDENT_AMBULATORY_CARE_PROVIDER_SITE_OTHER): Payer: Medicare Other | Admitting: Neurology

## 2017-04-12 VITALS — BP 132/68 | HR 78 | Resp 18 | Ht 70.0 in | Wt 228.0 lb

## 2017-04-12 DIAGNOSIS — G6289 Other specified polyneuropathies: Secondary | ICD-10-CM | POA: Diagnosis not present

## 2017-04-12 DIAGNOSIS — Z789 Other specified health status: Secondary | ICD-10-CM | POA: Diagnosis not present

## 2017-04-12 DIAGNOSIS — Z9181 History of falling: Secondary | ICD-10-CM

## 2017-04-12 DIAGNOSIS — R2689 Other abnormalities of gait and mobility: Secondary | ICD-10-CM

## 2017-04-12 DIAGNOSIS — G4733 Obstructive sleep apnea (adult) (pediatric): Secondary | ICD-10-CM

## 2017-04-12 DIAGNOSIS — Z9989 Dependence on other enabling machines and devices: Secondary | ICD-10-CM

## 2017-04-12 NOTE — Addendum Note (Signed)
Addended by: Star Age on: 04/12/2017 01:17 PM   Modules accepted: Orders
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
## 2017-04-12 NOTE — Patient Instructions (Addendum)
I believe you have a gait disorder, which likely is due to a combination of things: normal aging, degenerative arthritis of your back, neuropathy, arthritis and knee replacements.   Remember to drink plenty of fluid, eat healthy meals and do not skip any meals. Try to eat protein with a every meal and eat a healthy snack such as fruit or nuts in between meals. Try to keep a regular sleep-wake schedule and try to exercise daily, particularly in the form of walking, 20-30 minutes a day, if you can. Change positions slowly and you should start use your cane for safety. You may need to use your walker.   We do not have to do additional testing for your balance and gait issues. We know you have neuropathy, degenerative back disease, you have had an EMG and nerve conduction velocity test, which is an electrical nerve and muscle test, in the past.   You have had a recent brain scan.   Bending over and turning quickly may put you at fall risk.  Please remember to turn slowly, no bending down to pick up something heavy. No heavy lifting, be extra careful at night and first thing in the morning. Also, be careful in the Bathroom and the kitchen.  Make light enough to see and de-clutter house floors.   We will try autoPAP for 30 days, mask refit for your sleep apnea. We will have you come back for a day time appointment in the sleep lab to work with Shirlean Mylar, our sleep lab manager for a mask fitting and desensitization if needed.

## 2017-04-14 ENCOUNTER — Telehealth: Payer: Self-pay

## 2017-04-14 DIAGNOSIS — G4733 Obstructive sleep apnea (adult) (pediatric): Secondary | ICD-10-CM

## 2017-04-14 DIAGNOSIS — Z9989 Dependence on other enabling machines and devices: Principal | ICD-10-CM

## 2017-04-14 NOTE — Telephone Encounter (Signed)
Will reduce pressure to 11 cm and see how it goes, thank you Shirlean Mylar for working with him. He is certainly trying, which I commend him for.

## 2017-04-14 NOTE — Telephone Encounter (Signed)
Patient came to sleep lab for a mask fitting. He currently uses the Airfit F20 med and has been fiited with three different full face masks. I hooked him up to cpap of 15 which he is on at home and all full face mask leaked or had a whistling sound. With the shape of his face, there is not a mask for him at this pressure. I decreased him to 11 and it it worked much better. I also tried him on Auto and he liked that also. Will you script for cpap of 11 or Auto 5-15 and check download. This may be the only way to get him compliant. Sent him home F&:P simplus medium to use when settings are changed. Thanks

## 2017-04-17 NOTE — Telephone Encounter (Signed)
Order sent to [facility] °

## 2017-04-20 NOTE — Telephone Encounter (Signed)
I spoke to patient and he states that [facility] just changed it back to 11 and everything is set.

## 2017-04-20 NOTE — Telephone Encounter (Signed)
Pt called said he rec'd RX to lower pressure on machine to 11. Yesterday he picked up test machine and it is set for 15. Why is the pressure different on new machine? Please call to advise.

## 2017-04-20 NOTE — Telephone Encounter (Signed)
Please see appt 5/2 vs phone note on 5/4. Do you want patient to try trial of auto pap or stay on CPAP at 11?

## 2017-04-20 NOTE — Telephone Encounter (Signed)
Please see my order for CPAP of 11 cm.  Not sure what the question is.

## 2017-04-20 NOTE — Telephone Encounter (Signed)
I sent a message to [facility] to have them check on this. I advised that patient does not needs Auto-PAP trial, he just needs pressure changed to 11.

## 2017-04-20 NOTE — Telephone Encounter (Signed)
Shelby Dubin, RN; Hebert Soho, it does look like we set his unit to 11 on 5/8. And we did provide him a 30 day auto pap trial with settings of 7-15 on 5/9 and he is scheduled to return on 6/11.   I will get in touch with our RT group to have the auto-trial cancelled and ensure his unit is set at 11. Thanks.   Angie

## 2017-04-27 NOTE — Telephone Encounter (Signed)
Order placed for CPAP FFM F20 Airtouch

## 2017-04-27 NOTE — Addendum Note (Signed)
Addended by: Star Age on: 04/27/2017 03:58 PM   Modules accepted: Orders

## 2017-04-27 NOTE — Telephone Encounter (Signed)
Received this email from [place]:  Barnet Glasgow, [employer], RN  Cc: Hebert Soho,   Pt has called and spoken to our RT dept. He isn't happy with his current mask and he really wants to try the memory foam cushion. He is very very frustrated. We told him that we would reach out to Dr. Rexene Alberts to see if he agrees to specific order for the F20 AirTouch (memory Foam) cushion.   If he agrees please entered order for this and let me know. Thanks.   Angie

## 2017-05-01 NOTE — Telephone Encounter (Signed)
Confirmed that order was received by [facility]

## 2017-05-04 DIAGNOSIS — R32 Unspecified urinary incontinence: Secondary | ICD-10-CM | POA: Diagnosis not present

## 2017-05-24 ENCOUNTER — Other Ambulatory Visit: Payer: Self-pay | Admitting: Family Medicine

## 2017-05-24 DIAGNOSIS — E119 Type 2 diabetes mellitus without complications: Secondary | ICD-10-CM

## 2017-06-01 ENCOUNTER — Encounter: Payer: Self-pay | Admitting: Family Medicine

## 2017-06-01 ENCOUNTER — Ambulatory Visit (INDEPENDENT_AMBULATORY_CARE_PROVIDER_SITE_OTHER): Payer: Medicare Other | Admitting: Family Medicine

## 2017-06-01 VITALS — BP 128/72 | HR 85 | Temp 98.1°F | Ht 70.0 in | Wt 229.4 lb

## 2017-06-01 DIAGNOSIS — E785 Hyperlipidemia, unspecified: Secondary | ICD-10-CM

## 2017-06-01 DIAGNOSIS — E119 Type 2 diabetes mellitus without complications: Secondary | ICD-10-CM | POA: Diagnosis not present

## 2017-06-01 DIAGNOSIS — I10 Essential (primary) hypertension: Secondary | ICD-10-CM | POA: Diagnosis not present

## 2017-06-01 DIAGNOSIS — N320 Bladder-neck obstruction: Secondary | ICD-10-CM | POA: Diagnosis not present

## 2017-06-01 NOTE — Patient Instructions (Signed)
Finish up your victoza, and then we will stop this medication and try increasing your metformin to 1000 twice daily.  Increase to 1 am and 2 pm for a week, and then 2 twice a day However if your sugars start going up significantly let me know and I will rx Trulicity for you   Please see me for a recheck and fasting labs in about 3 months.

## 2017-06-01 NOTE — Progress Notes (Signed)
[place] [facility] [place], [place], [place] [address] [phone]  Date:  06/01/2017   Name:  Matthew Nash   DOB:  [birth year]   MRN:  [id number]  PCP:  Darreld Mclean, MD    Chief Complaint: Follow-up ([facility] will no longer supply Victoza. )   History of Present Illness:  Matthew Nash is a 81 y.o. very pleasant male patient who presents with the following:  Here today for a follow-up visit History of DM, hyperlipidemia These conditions have been most recently controlled with tricor, victoza 18mg  daily, zocor, lisinopril, metformin 500 BID  Recently got a notice that victoza will no longer be covered by his insurance plan.  trulicity is an alternative, but he wonders if we might just increase his metformin given his recent very good A1c He does check his glucose BID so he is able to monitor this closely   Lab Results  Component Value Date   HGBA1C 6.6 (H) 03/01/2017   BP Readings from Last 3 Encounters:  06/01/17 128/72  04/12/17 132/68  04/10/17 122/76   Dr. Venia Minks injected botox into his bladder recently and his urinary sx are much better- he is very pleased  His last cholesterol check was a year ago  He had cereal this am, [employer] for lunch- he would prefer to wait and check his lipids at our next viist    Patient Active Problem List   Diagnosis Date Noted  . Peripheral neuropathy 10/20/2016  . Bladder outlet obstruction 05/02/2016  . OA (osteoarthritis) of knee 05/27/2015  . Diabetes mellitus type II, controlled ([place]) 11/26/2014  . Gastroesophageal reflux disease without esophagitis 11/26/2014  . Arthritis of both knees 11/26/2014  . Absence of bladder continence 11/26/2014  . Hyperlipidemia LDL goal <100 11/26/2014  . Acute medial meniscal tear 07/16/2014  . Lumbosacral spondylosis without myelopathy 02/17/2014  . Spondylosis, lumbosacral 02/17/2014    Past Medical History:   Diagnosis Date  . Arthritis    both knees  . Back pain at L4-L5 level   . BPH (benign prostatic hyperplasia)    hx s/p turp  . Chronic back pain    spondylosis  . Chronic kidney disease    kidney stones 1979  . DDD (degenerative disc disease), lumbar   . Diabetes mellitus    takes Metformin and Victoza daily-under control  . Diarrhea    x 1 today  . Diverticulitis   . GERD (gastroesophageal reflux disease)    takes Omeprazole daily-under control  . H/O hiatal hernia   . H/O measles   . Hepatitis    B-with Mono and jaundice 1960  . History of chicken pox   . History of colon polyps   . History of kidney stones   . Hyperlipidemia    takes Simvastatin and Tricor daily-under control  . Hypertension    takes Lisinopril daily-under control  . Joint pain    both knees  . Joint swelling    rt knee  . Melanoma ([place])    right hand;basal cell carcinoma  . Neuropathy   . Peripheral vascular disease ([place])    diabetic neuropathy  . Pneumonia    hx of  . Prostate infection    currently taking macrobid  . Tinnitus   . Urinary incontinence   . UTI (lower urinary tract infection)     Past Surgical History:  Procedure Laterality Date  . BASAL  CELL CARCINOMA EXCISION    . BOTOX INJECTION  03/22/2017   In the bladder for nightly incontinence.  . C5 and T1 bone chip removed   1986  . [place]  . CHOLECYSTECTOMY  1993  . COLONOSCOPY  2011  . cortisone injection    . EYE SURGERY     cataract sx 2017 bilateral  . I&D left knee Left 1958   states 22 times  . KNEE ARTHROSCOPY Right 07/16/2014   Procedure: RIGHT ARTHROSCOPY KNEE WITH Medial and Lateral DEBRIDEMENT and chondroplasty;  Surgeon: Gearlean Alf, MD;  Location: [facility];  Service: Orthopedics;  Laterality: Right;  . LUMBAR LAMINECTOMY/DECOMPRESSION MICRODISCECTOMY Right 02/17/2014   Procedure: RIGHT LUMBAR TWO-THREE LAMINECTOMY;  Surgeon: Charlie Pitter, MD;  Location: [facility];  Service: Neurosurgery;   Laterality: Right;  right   . right shoulder surgery  2012   rotator cuff repair  . SHOULDER ARTHROSCOPY W/ ROTATOR CUFF REPAIR Left NOV 2010  . TONSILLECTOMY  1942  . TOTAL KNEE ARTHROPLASTY Right 05/27/2015   Procedure: RIGHT TOTAL KNEE ARTHROPLASTY;  Surgeon: Gaynelle Arabian, MD;  Location: [facility];  Service: Orthopedics;  Laterality: Right;  . TOTAL KNEE ARTHROPLASTY Left 05/30/2016   Procedure: LEFT TOTAL KNEE ARTHROPLASTY;  Surgeon: Gaynelle Arabian, MD;  Location: [facility];  Service: Orthopedics;  Laterality: Left;  . TRANSURETHRAL RESECTION OF PROSTATE  2006  . VASECTOMY  1983  . WISDOM TOOTH EXTRACTION      Social History  Substance Use Topics  . Smoking status: Former Smoker    Types: Pipe    Quit date: 07/14/1978  . Smokeless tobacco: Never Used     Comment: quit smoking 1979  . Alcohol use Yes     Comment: OCCASIONAL    Family History  Problem Relation Age of Onset  . Heart disease Mother 87       Deceased  . Bladder Cancer Mother   . Prostate cancer Father        Deceased-in 54s  . Healthy Brother   . Skin cancer Brother        "basal cell cancer in the bloodstream"  . Asthma Sister        Deceased  . Healthy Son        #1  . Healthy Daughter        #2  . Obesity Son        #2    Allergies  Allergen Reactions  . Albamycin [Novobiocin] Other (See Comments)    Made me look like a strawberry   . Penicillins Rash and Other (See Comments)    Has patient had a PCN reaction causing immediate rash, facial/tongue/throat swelling, SOB or lightheadedness with hypotension:  no Has patient had a PCN reaction causing severe rash involving mucus membranes or skin necrosis: no Has patient had a PCN reaction that required hospitalization no Has patient had a PCN reaction occurring within the last 10 years: no - childhood reaction If all of the above answers are "NO", then may proceed with Cephalosporin use.     Medication list has been reviewed and updated.  Current  Outpatient Prescriptions on File Prior to Visit  Medication Sig Dispense Refill  . CALCIUM-MAGNESIUM-VITAMIN D PO Take by mouth.    . celecoxib (CELEBREX) 200 MG capsule TAKE 1 CAPSULE TWICE A DAY 180 capsule 2  . cephALEXin (KEFLEX) 500 MG capsule     . fenofibrate (TRICOR) 145 MG tablet Take 1 tablet (  145 mg total) by mouth every evening. 90 tablet 3  . FIBER PO Take 3 capsules by mouth 2 (two) times daily.    [name] Kitchen glucose blood (FREESTYLE LITE) test strip Use as instructed to test Blood Glucose twice daily Dx: E11.9 200 each 12  . Insulin Pen Needle (CAREFINE PEN NEEDLES) 31G X 8 MM MISC Use once daily with Victoza injections. 100 each 3  . L-Methylfolate-Algae-B12-B6 (METANX PO) Take by mouth.    . Lancets (FREESTYLE) lancets USE 1 LANCET TWICE A DAY AS INSTRUCTED 200 each 3  . lisinopril (PRINIVIL,ZESTRIL) 5 MG tablet Take 1 tablet (5 mg total) by mouth every evening. 90 tablet 3  . metFORMIN (GLUCOPHAGE-XR) 500 MG 24 hr tablet TAKE 1 TABLET TWICE A DAY WITH MEALS 180 tablet 1  . Multiple Vitamin (MULTIVITAMIN) tablet Take 1 tablet by mouth daily.    [name] Kitchen omeprazole (PRILOSEC) 20 MG capsule Take 1 capsule (20 mg total) by mouth daily. 90 capsule 3  . Probiotic Product (PROBIOTIC PO) Take 1 capsule by mouth daily.    . simvastatin (ZOCOR) 10 MG tablet Take 1 tablet (10 mg total) by mouth at bedtime. 90 tablet 1  . VICTOZA 18 MG/3ML SOPN INJECT 1.8 MG (0.3 ML) UNDER THE SKIN DAILY 27 mL 1   No current facility-administered medications on file prior to visit.     Review of Systems:  As per HPI- otherwise negative. No fever, chills, CP, SOB< nausea, vomiting, diarrhea, or rash  Physical Examination: Vitals:   06/01/17 1457  BP: 128/72  Pulse: 85  Temp: 98.1 F (36.7 C)   Vitals:   06/01/17 1457  Weight: 229 lb 6.4 oz (104.1 kg)  Height: 5\' 10"  (1.778 m)   Body mass index is 32.92 kg/m. Ideal Body Weight: Weight in (lb) to have BMI = 25: 173.9  GEN: WDWN, NAD, Non-toxic, A & O x  3, central obesity, looks well HEENT: Atraumatic, Normocephalic. Neck supple. No masses, No LAD. Ears and Nose: No external deformity. CV: RRR, No M/G/R. No JVD. No thrill. No extra heart sounds. PULM: CTA B, no wheezes, crackles, rhonchi. No retractions. No resp. distress. No accessory muscle use. EXTR: No c/c/e NEURO Normal gait.  PSYCH: Normally interactive. Conversant. Not depressed or anxious appearing.  Calm demeanor.    Assessment and Plan: Controlled type 2 diabetes mellitus without complication, without long-term current use of insulin ([place])  Dyslipidemia  Bladder outlet obstruction  Essential hypertension  [place] is planning to stop victoza as it will no longer be covered. Can sub trulicity, but he may do well with no GLP-1 and this would also save money.  His GFR is fine so we are ok to go up on his metformin He plans to finish the victoza he has and then stop use,. Will then titrate up his metformin to 1000 BID He will let me know if his glucose is getting too high on this regimen and in that case we can add trulicity Asked him to see me in about 3 months for fasting labs and recheck   Signed Lamar Blinks, MD

## 2017-06-07 ENCOUNTER — Ambulatory Visit: Payer: Self-pay | Admitting: Neurology

## 2017-06-12 ENCOUNTER — Telehealth: Payer: Self-pay | Admitting: Physical Therapy

## 2017-06-12 DIAGNOSIS — R296 Repeated falls: Secondary | ICD-10-CM

## 2017-06-12 NOTE — Telephone Encounter (Signed)
Dr. Lorelei Pont, Mr. Yamada called me with some questions regarding his AFOs and shoes.  Upon further discussion he mentioned he has had at least 3-4 falls in the past 3 months. I feel would benefit from a PT evaluation for balance and gait instability.   If you agree, please place an order in [facility] in [place].  Thank you, Laureen Abrahams, PT, DPT 06/12/17 11:12 AM

## 2017-06-12 NOTE — Telephone Encounter (Signed)
Patient requesting a call back form Faustino Congress, PT. Did not specify the reason for calling before call ended. Call patient to advise.

## 2017-06-12 NOTE — Telephone Encounter (Signed)
Returned pt's call.  He had questions regarding his AFOs about wear time and shoes.  Educated on locations to purchase shoes that are familiar with AFOs and that the orthotics are something he will likely need to wear long-term as it's unlikely he will regain dorsiflexion strength.   Further discussion indicated pt has reported at least 3 falls in 3 months, and limited ability to get up from floor after a fall.  Pt willing to return to PT, and agreeable to requesting referral.  Will send request to Dr. Lorelei Pont.  Laureen Abrahams, PT, DPT 06/12/17 11:08 AM

## 2017-06-13 ENCOUNTER — Encounter: Payer: Self-pay | Admitting: Neurology

## 2017-06-13 ENCOUNTER — Ambulatory Visit (INDEPENDENT_AMBULATORY_CARE_PROVIDER_SITE_OTHER): Payer: Medicare Other | Admitting: Neurology

## 2017-06-13 VITALS — BP 124/71 | HR 85 | Ht 71.0 in | Wt 229.0 lb

## 2017-06-13 DIAGNOSIS — G4733 Obstructive sleep apnea (adult) (pediatric): Secondary | ICD-10-CM

## 2017-06-13 DIAGNOSIS — Z9989 Dependence on other enabling machines and devices: Secondary | ICD-10-CM

## 2017-06-13 NOTE — Telephone Encounter (Signed)
Yes.  He wants to go across the hall.  The department is [facility].   Thanks so much! Colletta [place]

## 2017-06-13 NOTE — Telephone Encounter (Signed)
Hi Matthew Nash- I would be glad to!  However I am not familiar with the "[facility]" workque.  Do I just place a PT order? Thanks!  [place]
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
## 2017-06-13 NOTE — Patient Instructions (Signed)
Please continue using your CPAP regularly. While your insurance requires that you use CPAP at least 4 hours each night on 70% of the nights, I recommend, that you not skip any nights and use it throughout the night if you can. Getting used to CPAP and staying with the treatment long term does take time and patience and discipline. Untreated obstructive sleep apnea when it is moderate to severe can have an adverse impact on cardiovascular health and raise her risk for heart disease, arrhythmias, hypertension, congestive heart failure, stroke and diabetes. Untreated obstructive sleep apnea causes sleep disruption, nonrestorative sleep, and sleep deprivation. This can have an impact on your day to day functioning and cause daytime sleepiness and impairment of cognitive function, memory loss, mood disturbance, and problems focussing. Using CPAP regularly can improve these symptoms.  We have your sleep apnea about 50% improved, which is better than where we were in May.

## 2017-06-15 ENCOUNTER — Other Ambulatory Visit: Payer: Self-pay | Admitting: Family Medicine

## 2017-06-15 IMAGING — MR MR HEAD WO/W CM
15 of 19 series · 31 of 48 positions shown · IV contrast (multihance)
Comparison: None.

CLINICAL DATA: Left fourth cranial nerve palsy.  Diplopia

EXAM:
MRI HEAD AND ORBITS WITHOUT AND WITH CONTRAST
TECHNIQUE: Multiplanar, multiecho pulse sequences of the brain and surrounding
structures were obtained without and with intravenous contrast.
Multiplanar, multiecho pulse sequences of the orbits and surrounding
structures were obtained including fat saturation techniques, before
and after intravenous contrast administration.
CONTRAST:  20mL MULTIHANCE GADOBENATE DIMEGLUMINE 529 MG/ML IV SOLN

[Series 6: T1 · sagittal · 4.0mm · 0.75mm/px · 1 of 25 slices shown (1 of 3)]
[im 1/25]
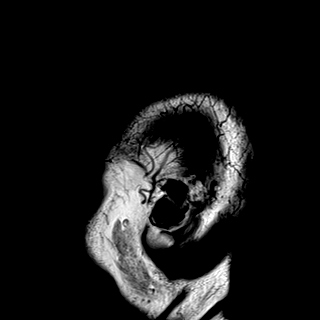

[Series 7: DWI · axial · 3.0mm · 1.44mm/px · z∈[-47,+90]mm · 3 of 80 slices shown (1 of 4)]
[im 1/80]
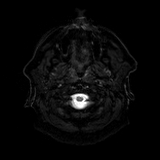
[im 40/80]
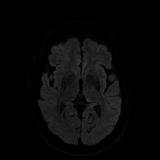
[im 80/80]
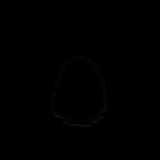

[Series 8: DWI · axial · 3.0mm · 1.44mm/px · 1 of 39 slices shown (2 of 4)]
[im 1/39]
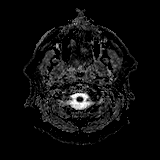

[Series 9: DWI · coronal · 5.0mm · 1.44mm/px · 2 of 60 slices shown (3 of 4)]
[im 1/60]
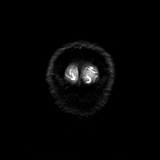
[im 60/60]
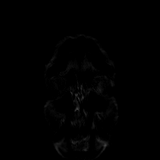

[Series 10: DWI · coronal · 5.0mm · 1.44mm/px · 2 of 30 slices shown (4 of 4)]
[im 1/30]
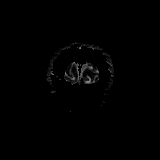
[im 30/30]
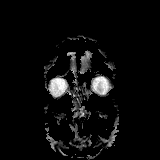

[Series 11: T2 · axial · 4.0mm · 0.36mm/px · 1 of 27 slices shown]
[im 1/27]
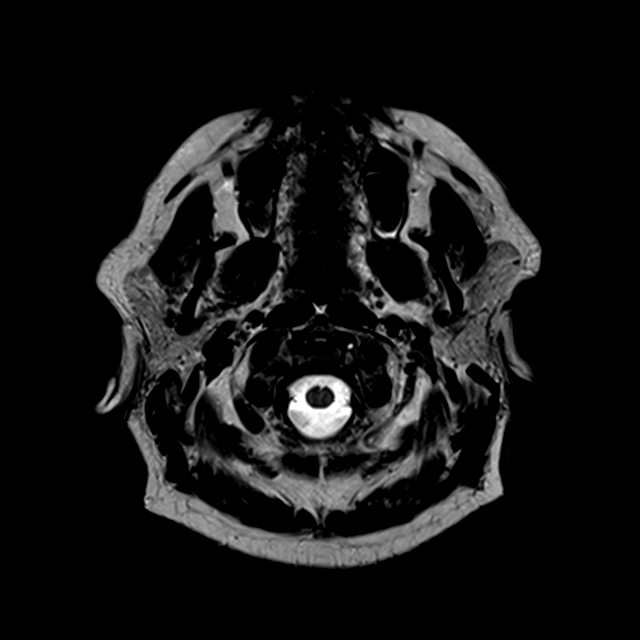

[Series 12: FLAIR · axial · 4.0mm · 0.72mm/px · 1 of 27 slices shown]
[im 1/27]
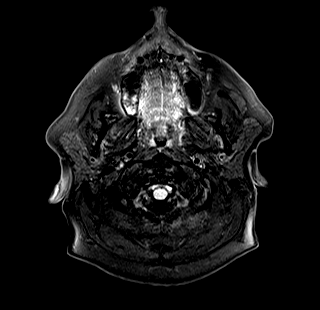

[Series 15: T1 · axial · 1.0mm · 0.90mm/px · z∈[-53,+87]mm · 7 of 144 slices shown (2 of 3)]
[im 1/144]
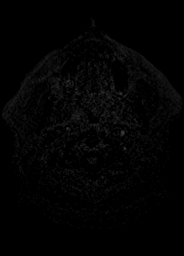
[im 24/144]
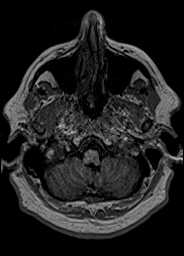
[im 48/144]
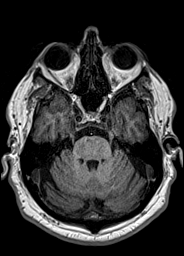
[im 72/144]
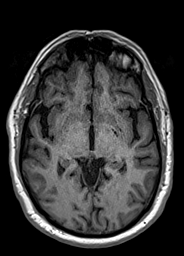
[im 96/144]
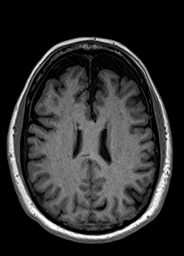
[im 120/144]
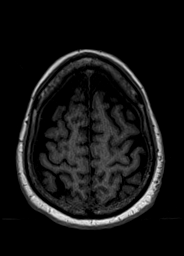
[im 144/144]
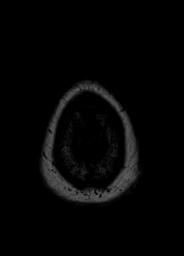

[Series 16: T2 fat-sat · axial · 2.5mm · 0.25mm/px · z∈[-50,+28]mm · 2 of 30 slices shown (1 of 3)]
[im 1/30]
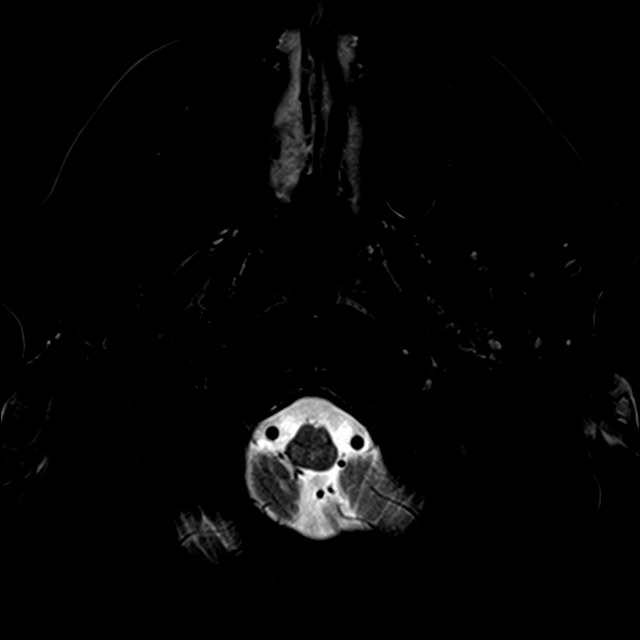
[im 30/30]
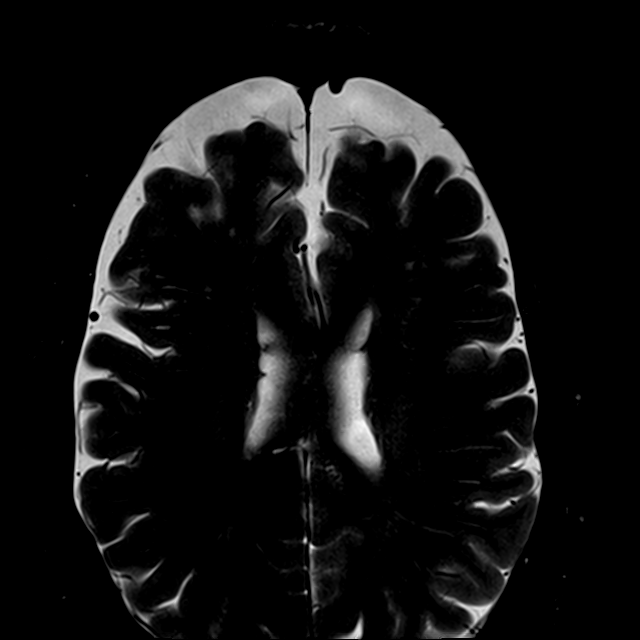

[Series 17: T2 fat-sat · coronal · 2.5mm · 0.25mm/px · 2 of 30 slices shown (2 of 3)]
[im 1/30]
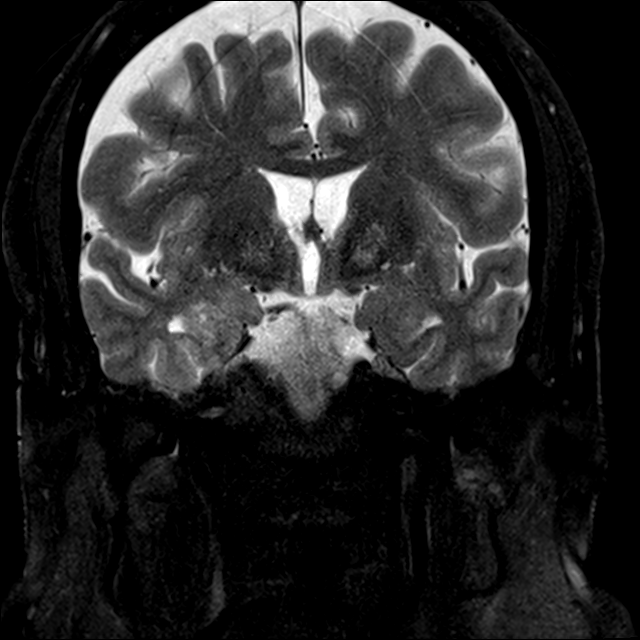
[im 30/30]
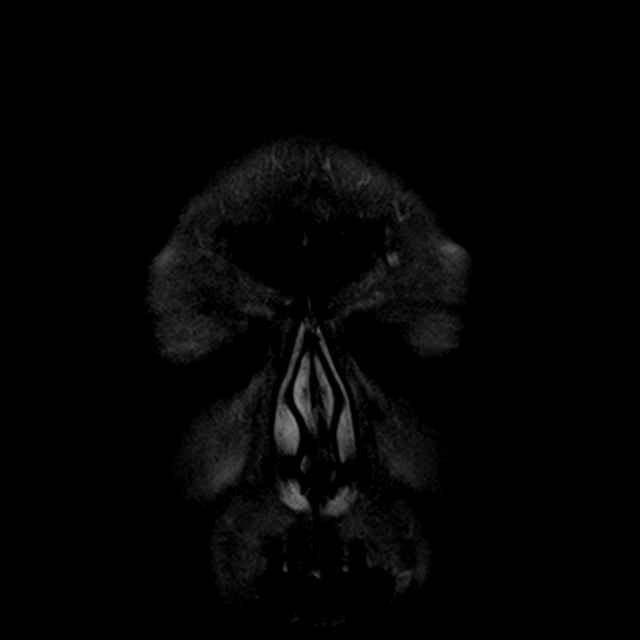

[Series 18: T1 · axial · 2.5mm · 0.25mm/px · 1 of 30 slices shown (3 of 3)]
[im 1/30]
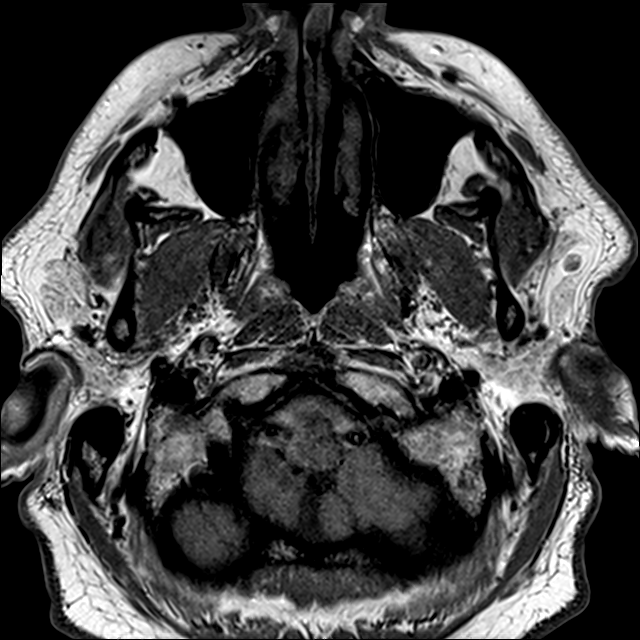

[Series 20: T2 fat-sat · coronal · 2.5mm · 0.25mm/px · 2 of 30 slices shown (3 of 3)]
[im 1/30]
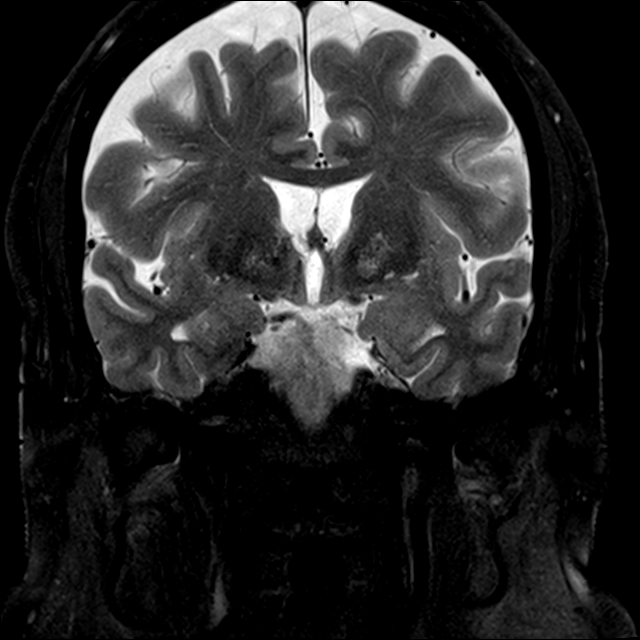
[im 30/30]
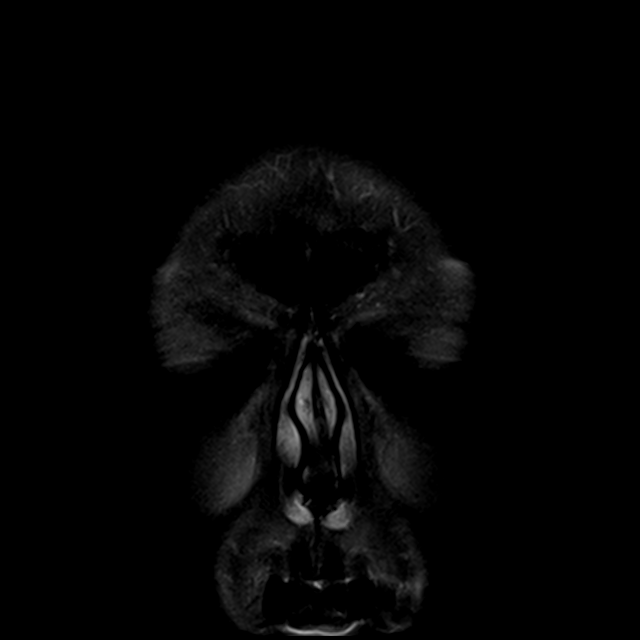

[Series 22: T1 post-contrast · coronal · 2.5mm · 0.50mm/px · 2 of 30 slices shown (1 of 2)]
[im 1/30]
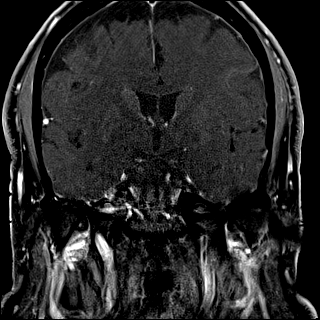
[im 30/30]
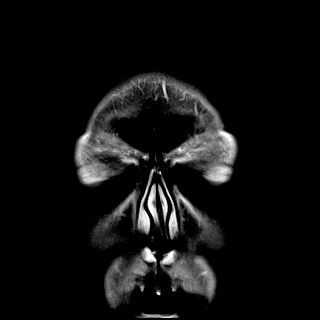

[Series 23: T2 post-contrast · coronal · 4.5mm · 0.36mm/px · 2 of 30 slices shown]
[im 1/30]
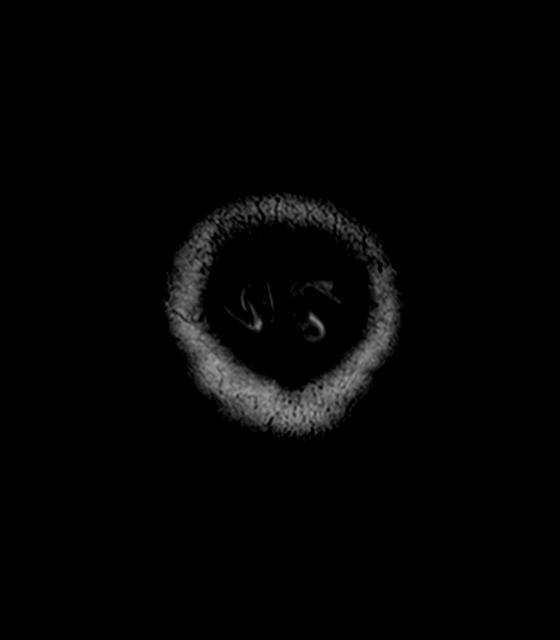
[im 30/30]
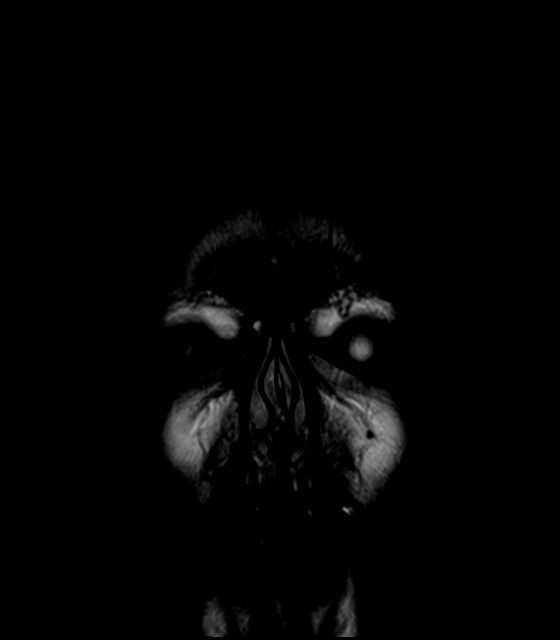

[Series 24: T1 post-contrast · coronal · 4.5mm · 0.90mm/px · 2 of 30 slices shown (2 of 2)]
[im 1/30]
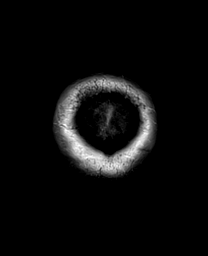
[im 30/30]
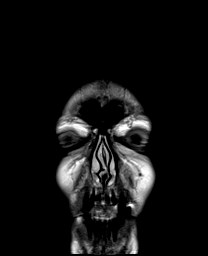

[31 of 48 positions shown; findings below may reference images not displayed]

FINDINGS: MRI HEAD FINDINGS

Brain: Generalized atrophy most prominent in the frontal lobes.
Negative for hydrocephalus. Negative for acute infarct. Mild chronic
microvascular ischemic change in the white matter. Negative for
hemorrhage or mass.

Postcontrast imaging demonstrates normal enhancement. No enhancing
mass lesion.

Brainstem and cerebellum normal.

Vascular: Normal flow voids.

Skull and upper cervical spine: Normal marrow signal.

Other: None

MRI ORBITS FINDINGS

Orbits: Bilateral lens replacement. Globe is symmetric in size and
contour and appears normal. Extraocular muscles normal. Orbital fat
normal. Optic nerve normal in caliber and signal bilaterally. No
orbital mass lesion. Orbital apex normal. Optic canal normal. Optic
chiasm and optic nerves normal bilaterally. Normal cavernous sinus
bilaterally.

Visualized sinuses: Mild mucosal edema in the ethmoid sinuses
bilaterally.

Soft tissues: No soft tissue swelling.
IMPRESSION: No acute intracranial abnormality.

Normal MRI of the orbit.

No cause for fourth cranial nerve palsy identified.

## 2017-06-21 ENCOUNTER — Ambulatory Visit: Payer: Medicare Other | Attending: Family Medicine | Admitting: Physical Therapy

## 2017-06-21 DIAGNOSIS — R296 Repeated falls: Secondary | ICD-10-CM

## 2017-06-21 DIAGNOSIS — R2689 Other abnormalities of gait and mobility: Secondary | ICD-10-CM | POA: Diagnosis not present

## 2017-06-21 DIAGNOSIS — R2681 Unsteadiness on feet: Secondary | ICD-10-CM | POA: Diagnosis not present

## 2017-06-21 NOTE — Therapy (Signed)
[facility] [address]  [place], [place], [address] Phone: [phone]   Fax:  [phone]  Physical Therapy Evaluation  Patient Details  Name: Matthew Nash MRN: [id number] Date of Birth: 05/18/1936 Referring Provider: Dr. Lamar Blinks  Encounter Date: 06/21/2017      PT End of Session - 06/21/17 1514    Visit Number 1   Number of Visits 16   Date for PT Re-Evaluation 08/16/17   Authorization Type Medicare/Tricare   PT Start Time 1021   PT Stop Time 1100   PT Time Calculation (min) 39 min   Activity Tolerance Patient tolerated treatment well   Behavior During Therapy [facility] for tasks assessed/performed      Past Medical History:  Diagnosis Date  . Arthritis    both knees  . Back pain at L4-L5 level   . BPH (benign prostatic hyperplasia)    hx s/p turp  . Chronic back pain    spondylosis  . Chronic kidney disease    kidney stones 1979  . DDD (degenerative disc disease), lumbar   . Diabetes mellitus    takes Metformin and Victoza daily-under control  . Diarrhea    x 1 today  . Diverticulitis   . GERD (gastroesophageal reflux disease)    takes Omeprazole daily-under control  . H/O hiatal hernia   . H/O measles   . Hepatitis    B-with Mono and jaundice 1960  . History of chicken pox   . History of colon polyps   . History of kidney stones   . Hyperlipidemia    takes Simvastatin and Tricor daily-under control  . Hypertension    takes Lisinopril daily-under control  . Joint pain    both knees  . Joint swelling    rt knee  . Melanoma ([place])    right hand;basal cell carcinoma  . Neuropathy   . Peripheral vascular disease ([place])    diabetic neuropathy  . Pneumonia    hx of  . Prostate infection    currently taking macrobid  . Tinnitus   . Urinary incontinence   . UTI (lower urinary tract infection)     Past Surgical History:  Procedure Laterality Date  . BASAL CELL CARCINOMA EXCISION     . BOTOX INJECTION  03/22/2017   In the bladder for nightly incontinence.  . C5 and T1 bone chip removed   1986  . [place]  . CHOLECYSTECTOMY  1993  . COLONOSCOPY  2011  . cortisone injection    . EYE SURGERY     cataract sx 2017 bilateral  . I&D left knee Left 1958   states 22 times  . KNEE ARTHROSCOPY Right 07/16/2014   Procedure: RIGHT ARTHROSCOPY KNEE WITH Medial and Lateral DEBRIDEMENT and chondroplasty;  Surgeon: Gearlean Alf, MD;  Location: [facility];  Service: Orthopedics;  Laterality: Right;  . LUMBAR LAMINECTOMY/DECOMPRESSION MICRODISCECTOMY Right 02/17/2014   Procedure: RIGHT LUMBAR TWO-THREE LAMINECTOMY;  Surgeon: Charlie Pitter, MD;  Location: [place] [facility];  Service: Neurosurgery;  Laterality: Right;  right   . right shoulder surgery  2012   rotator cuff repair  . SHOULDER ARTHROSCOPY W/ ROTATOR CUFF REPAIR Left NOV 2010  . TONSILLECTOMY  1942  . TOTAL KNEE ARTHROPLASTY Right 05/27/2015   Procedure: RIGHT TOTAL KNEE ARTHROPLASTY;  Surgeon: Gaynelle Arabian, MD;  [facility];  Service: Orthopedics;  Laterality: Right;  . TOTAL KNEE ARTHROPLASTY Left 05/30/2016  Procedure: LEFT TOTAL KNEE ARTHROPLASTY;  Surgeon: Gaynelle Arabian, MD;  [facility];  Service: Orthopedics;  Laterality: Left;  . TRANSURETHRAL RESECTION OF PROSTATE  2006  . VASECTOMY  1983  . WISDOM TOOTH EXTRACTION      There were no vitals filed for this visit.       Subjective Assessment - 06/21/17 1023    Subjective Patient reporting reduced balance. Fell yesterday morning (backwards) - getting out of pajamas - able to get off floor after ~10 minutes. Able to turn on stomach, get onto knees and power up. Reports falling 1x/month. Looses balance everyday - able to catch himself with wall/furniture. Has been in B AFO for over 1 year - wears daily - feels like the heels of his shoes are wearing out due to a lot of pressure at heels. Reports he does not do any exercises from prior PT  sessions. Uses cane for outdoors and in the community.    Pertinent History DM 2 (controlled), HTN (controlled), neuropathy   Patient Stated Goals improve balance   Currently in Pain? No/denies   Pain Score 0-No pain            [facility] PT Assessment - 06/21/17 1031      Assessment   Medical Diagnosis Frequent falls   Referring Provider Dr. Lamar Blinks   Next MD Visit --  Fall 2018   Prior Therapy yes     Precautions   Precautions None     Restrictions   Weight Bearing Restrictions No     Balance Screen   Has the patient fallen in the past 6 months Yes   How many times? ~5   Has the patient had a decrease in activity level because of a fear of falling?  Yes   Is the patient reluctant to leave their home because of a fear of falling?  No     Home Environment   Living Environment Private residence   Living Arrangements Spouse/significant other   Type of [place] to enter   Entrance Stairs-Number of Steps 1   Additional Comments 2 steps to get into bed     Prior Function   Level of Independence Independent   Vocation Retired     Associate Professor   Overall Cognitive Status Within Functional Limits for tasks assessed     Observation/Other Assessments   Focus on Therapeutic Outcomes (FOTO)  Neuro: 54 (46% limited, predicted 42% limited)     Sensation   Light Touch Appears Intact   Additional Comments reports neuropathy at B great toes     Coordination   Gross Motor Movements are Fluid and Coordinated Yes     Posture/Postural Control   Posture/Postural Control Postural limitations   Postural Limitations Rounded Shoulders;Forward head     ROM / Strength   AROM / PROM / Strength AROM;Strength     AROM   Overall AROM  Within functional limits for tasks performed   Overall AROM Comments B LE     Strength   Overall Strength Comments Grossly 4/5 to 4+/5 at B LE   Strength Assessment Site Hip;Knee   Right/Left Hip Right;Left   Right/Left Knee  Right;Left     Ambulation/Gait   Ambulation/Gait Yes   Ambulation/Gait Assistance 6: Modified independent (Device/Increase time)   Ambulation Distance (Feet) 100 Feet   Assistive device Straight cane   Gait Pattern Decreased stride length;Trunk flexed;Poor foot clearance - left;Poor foot clearance - right  Ambulation Surface Level;Indoor   Gait velocity 2.39 ft/sec   Gait Comments unsteadiness with tendency to rush further increasing his fall risk     Balance   Balance Assessed Yes     Standardized Balance Assessment   Standardized Balance Assessment Berg Balance Test;Timed Up and Go Test;Five Times Sit to Stand   Five times sit to stand comments  14.75     Berg Balance Test   Sit to Stand Able to stand  independently using hands   Standing Unsupported Able to stand 2 minutes with supervision   Sitting with Back Unsupported but Feet Supported on Floor or Stool Able to sit safely and securely 2 minutes   Stand to Sit Controls descent by using hands   Transfers Able to transfer safely, definite need of hands   Standing Unsupported with Eyes Closed Able to stand 3 seconds   Standing Ubsupported with Feet Together Able to place feet together independently but unable to hold for 30 seconds   From Standing, Reach Forward with Outstretched Arm Can reach forward >5 cm safely (2")   From Standing Position, Pick up Object from Floor Unable to try/needs assist to keep balance   From Standing Position, Turn to Look Behind Over each Shoulder Needs supervision when turning   Turn 360 Degrees Needs close supervision or verbal cueing   Standing Unsupported, Alternately Place Feet on Step/Stool Able to complete >2 steps/needs minimal assist   Standing Unsupported, One Foot in [employer] balance while stepping or standing   Standing on One Leg Tries to lift leg/unable to hold 3 seconds but remains standing independently   Total Score 26     Timed Up and Go Test   Normal TUG (seconds) 12.87    Manual TUG (seconds) 17.75   Cognitive TUG (seconds) 13.21            Objective measurements completed on examination: See above findings.                  PT Education - 06/21/17 1514    Education provided Yes   Education Details exam findings, POC   Person(s) Educated Patient   Methods Demonstration;Explanation   Comprehension Verbalized understanding;Need further instruction          PT Short Term Goals - 06/21/17 1519      PT SHORT TERM GOAL #1   Title patient to be independent with HEP (all STGs to be met by 07/19/17)   Status New     PT SHORT TERM GOAL #2   Title Patient to improve Berg to 32/56 demonstrating improved balance   Status New           PT Long Term Goals - 06/21/17 1527      PT LONG TERM GOAL #1   Title patient to be independent with advanced HEP (all goals to be met by 08/16/17)   Status New     PT LONG TERM GOAL #2   Title Patient to improve Berg to 44/56 demonstrating reduced fall risk    Status New     PT LONG TERM GOAL #3   Title Patient to improve gait velocity to >/= 2.62 ft/sec with no instability or LOB    Status New     PT LONG TERM GOAL #4   Title Patient to demonstrate ability to ambulate on various indoor/outdoor surfaces without LOB for improved functional mobility    Status New     PT LONG TERM GOAL #5  Title Patient to report no falls for greater than 2 weeks    Status New                Plan - 06/21/17 1514    Clinical Impression Statement Mr. Chamberlin is a pleasant 81 y/o male presenting to OPPT today regarding primary complaints of poor balance with recurrent falls within the home. Patient reports he looses his balance on a daily basis but is able to catch himself on walls and furniture, but does have at least one fall to ground level per month. Patient ambulating with both walking stick and use of B AFOs with poor safety awareness with gait and tendency to rush further increasing his fall risk. Berg  Balance, TUG, and 5x sit to stand assessed today with all scores placing patient in a high fall risk category. Patient with 1 major LOB today while attempting to retrieve item from floor requiring Mod A from PT to ensure patient did not fall and return patient to upright posturing. Patient to beneift from PT to address falls risk for hopeful reduction in number of falls/episodes of LOB.    Clinical Presentation Evolving   Clinical Presentation due to: frequent falls, DM2 with neuropathy, HTN, use of B AFO for gait   Clinical Decision Making Moderate   Rehab Potential Good   PT Frequency 2x / week   PT Duration 8 weeks   PT Treatment/Interventions ADLs/Self Care Home Management;Cryotherapy;Electrical Stimulation;Moist Heat;Ultrasound;Neuromuscular re-education;Balance training;Therapeutic exercise;Therapeutic activities;Functional mobility training;Stair training;Gait training;Patient/family education;Vasopneumatic Device;Taping   Consulted and Agree with Plan of Care Patient      Patient will benefit from skilled therapeutic intervention in order to improve the following deficits and impairments:  Abnormal gait, Decreased balance, Decreased mobility, Difficulty walking  Visit Diagnosis: Repeated falls - Plan: PT plan of care cert/re-cert  Unsteadiness on feet - Plan: PT plan of care cert/re-cert  Other abnormalities of gait and mobility - Plan: PT plan of care cert/re-cert      G-Codes - 39/03/00 1530    Functional Assessment Tool Used (Outpatient Only) FOTO: 54 (46% limited) + clinical judgement + high frequency of falls   Functional Limitation Mobility: Walking and moving around   Mobility: Walking and Moving Around Current Status ([id number]) At least 60 percent but less than 80 percent impaired, limited or restricted   Mobility: Walking and Moving Around Goal Status [phone]) At least 40 percent but less than 60 percent impaired, limited or restricted       Problem List Patient Active  Problem List   Diagnosis Date Noted  . Peripheral neuropathy 10/20/2016  . Bladder outlet obstruction 05/02/2016  . OA (osteoarthritis) of knee 05/27/2015  . Diabetes mellitus type II, controlled ([place]) 11/26/2014  . Gastroesophageal reflux disease without esophagitis 11/26/2014  . Arthritis of both knees 11/26/2014  . Absence of bladder continence 11/26/2014  . Hyperlipidemia LDL goal <100 11/26/2014  . Acute medial meniscal tear 07/16/2014  . Lumbosacral spondylosis without myelopathy 02/17/2014  . Spondylosis, lumbosacral 02/17/2014     Lanney Gins, PT, DPT 06/21/17 3:34 PM   [facility] [address]  [place], [place], [address] Phone: [phone]   Fax:  [phone]  Name: Matthew Nash MRN: [id number] Date of Birth: 09-Jan-1936

## 2017-06-27 ENCOUNTER — Ambulatory Visit: Payer: Medicare Other | Admitting: Physical Therapy

## 2017-06-27 DIAGNOSIS — R2681 Unsteadiness on feet: Secondary | ICD-10-CM | POA: Diagnosis not present

## 2017-06-27 DIAGNOSIS — R2689 Other abnormalities of gait and mobility: Secondary | ICD-10-CM

## 2017-06-27 DIAGNOSIS — R296 Repeated falls: Secondary | ICD-10-CM

## 2017-06-27 NOTE — Therapy (Signed)
[facility] [address]  [place], [place], [address] Phone: [phone]   Fax:  [phone]  Physical Therapy Treatment  Patient Details  Name: Matthew Nash MRN: [id number] Date of Birth: 10/25/1936 Referring Provider: Dr. Lamar Blinks  Encounter Date: 06/27/2017      PT End of Session - 06/27/17 1449    Visit Number 2   Number of Visits 16   Date for PT Re-Evaluation 08/16/17   Authorization Type Medicare/Tricare   PT Start Time 1449   PT Stop Time 1529   PT Time Calculation (min) 40 min   Activity Tolerance Patient tolerated treatment well   Behavior During Therapy [facility] for tasks assessed/performed      Past Medical History:  Diagnosis Date  . Arthritis    both knees  . Back pain at L4-L5 level   . BPH (benign prostatic hyperplasia)    hx s/p turp  . Chronic back pain    spondylosis  . Chronic kidney disease    kidney stones 1979  . DDD (degenerative disc disease), lumbar   . Diabetes mellitus    takes Metformin and Victoza daily-under control  . Diarrhea    x 1 today  . Diverticulitis   . GERD (gastroesophageal reflux disease)    takes Omeprazole daily-under control  . H/O hiatal hernia   . H/O measles   . Hepatitis    B-with Mono and jaundice 1960  . History of chicken pox   . History of colon polyps   . History of kidney stones   . Hyperlipidemia    takes Simvastatin and Tricor daily-under control  . Hypertension    takes Lisinopril daily-under control  . Joint pain    both knees  . Joint swelling    rt knee  . Melanoma ([place])    right hand;basal cell carcinoma  . Neuropathy   . Peripheral vascular disease ([place])    diabetic neuropathy  . Pneumonia    hx of  . Prostate infection    currently taking macrobid  . Tinnitus   . Urinary incontinence   . UTI (lower urinary tract infection)     Past Surgical History:  Procedure Laterality Date  . BASAL CELL CARCINOMA EXCISION     . BOTOX INJECTION  03/22/2017   In the bladder for nightly incontinence.  . C5 and T1 bone chip removed   1986  . [place]  . CHOLECYSTECTOMY  1993  . COLONOSCOPY  2011  . cortisone injection    . EYE SURGERY     cataract sx 2017 bilateral  . I&D left knee Left 1958   states 22 times  . KNEE ARTHROSCOPY Right 07/16/2014   Procedure: RIGHT ARTHROSCOPY KNEE WITH Medial and Lateral DEBRIDEMENT and chondroplasty;  Surgeon: Gearlean Alf, MD;  [facility];  Service: Orthopedics;  Laterality: Right;  . LUMBAR LAMINECTOMY/DECOMPRESSION MICRODISCECTOMY Right 02/17/2014   Procedure: RIGHT LUMBAR TWO-THREE LAMINECTOMY;  Surgeon: Charlie Pitter, MD;  Location: [place] [facility];  Service: Neurosurgery;  Laterality: Right;  right   . right shoulder surgery  2012   rotator cuff repair  . SHOULDER ARTHROSCOPY W/ ROTATOR CUFF REPAIR Left NOV 2010  . TONSILLECTOMY  1942  . TOTAL KNEE ARTHROPLASTY Right 05/27/2015   Procedure: RIGHT TOTAL KNEE ARTHROPLASTY;  Surgeon: Gaynelle Arabian, MD;  [facility];  Service: Orthopedics;  Laterality: Right;  . TOTAL KNEE ARTHROPLASTY Left 05/30/2016  Procedure: LEFT TOTAL KNEE ARTHROPLASTY;  Surgeon: Gaynelle Arabian, MD;  [facility];  Service: Orthopedics;  Laterality: Left;  . TRANSURETHRAL RESECTION OF PROSTATE  2006  . VASECTOMY  1983  . WISDOM TOOTH EXTRACTION      There were no vitals filed for this visit.      Subjective Assessment - 06/27/17 1455    Subjective No further falls since initial eval but admits to stumbling where he has had to catch himself.   Pertinent History DM 2 (controlled), HTN (controlled), neuropathy   Patient Stated Goals improve balance   Currently in Pain? No/denies                         [facility] Adult PT Treatment/Exercise - 06/27/17 1449      Exercises   Exercises Knee/Hip     Knee/Hip Exercises: Aerobic   Nustep lvl 6 x 6' (LE only)             Balance Exercises -  06/27/17 1449      OTAGO PROGRAM   Head Movements Standing;5 reps   Neck Movements Standing;5 reps   Back Extension Standing;5 reps  back to counter top   Trunk Movements Standing;5 reps   Ankle Movements --  deferred   Knee Extensor --  15 reps   Knee Flexor --  15 reps   Hip ABductor --  15 reps   Ankle Plantorflexors --  deferred   Ankle Dorsiflexors --  deferred   Knee Bends 10 reps, support   Walking and Turning Around Assistive device   Sideways Walking No assistive device   Tandem Stance 10 seconds, support   Tandem Walk Support   One Leg Stand 10 seconds, support   Heel Walking --  deferred   Toe Walk --  deferred   Heel Toe Walking Backward --  Level C = Support   Sit to Stand --  10 reps, one support   Stair Walking --  deferred           PT Education - 06/27/17 1529    Education provided Yes   Education Details [employer] Program - desired HEP activities highlighted in yellow - Pt instructed to complete program daily on days not attending PT.   Person(s) Educated Patient   Methods Explanation;Demonstration;Handout   Comprehension Verbalized understanding;Returned demonstration;Need further instruction          PT Short Term Goals - 06/27/17 1503      PT SHORT TERM GOAL #1   Title patient to be independent with HEP (all STGs to be met by 07/19/17)   Status On-going     PT SHORT TERM GOAL #2   Title Patient to improve Berg to 32/56 demonstrating improved balance   Status On-going           PT Long Term Goals - 06/27/17 1505      PT LONG TERM GOAL #1   Title patient to be independent with advanced HEP (all goals to be met by 08/16/17)   Status On-going     PT LONG TERM GOAL #2   Title Patient to improve Berg to 44/56 demonstrating reduced fall risk    Status On-going     PT LONG TERM GOAL #3   Title Patient to improve gait velocity to >/= 2.62 ft/sec with no instability or LOB    Status On-going     PT LONG TERM GOAL #4  Title Patient to demonstrate ability to ambulate on various indoor/outdoor surfaces without LOB for improved functional mobility    Status On-going     PT LONG TERM GOAL #5   Title Patient to report no falls for greater than 2 weeks    Status On-going               Plan - 06/27/17 1505    Clinical Impression Statement Introduced [place] fall prevention program with modifications made to defer activities involving ankle ROM due to presence of B AFOs and stairs as pt does not have stairs at home. All activities completed at supported level with pt experiencing occasioanl minor LOB for which he was able to safely self-correct. Pt instructed to complete program daily on days not attending PT.   Rehab Potential Good   PT Treatment/Interventions ADLs/Self Care Home Management;Cryotherapy;Electrical Stimulation;Moist Heat;Ultrasound;Neuromuscular re-education;Balance training;Therapeutic exercise;Therapeutic activities;Functional mobility training;Stair training;Gait training;Patient/family education;Vasopneumatic Device;Taping   Consulted and Agree with Plan of Care Patient      Patient will benefit from skilled therapeutic intervention in order to improve the following deficits and impairments:  Abnormal gait, Decreased balance, Decreased mobility, Difficulty walking  Visit Diagnosis: Repeated falls  Unsteadiness on feet  Other abnormalities of gait and mobility     Problem List Patient Active Problem List   Diagnosis Date Noted  . Peripheral neuropathy 10/20/2016  . Bladder outlet obstruction 05/02/2016  . OA (osteoarthritis) of knee 05/27/2015  . Diabetes mellitus type II, controlled ([place]) 11/26/2014  . Gastroesophageal reflux disease without esophagitis 11/26/2014  . Arthritis of both knees 11/26/2014  . Absence of bladder continence 11/26/2014  . Hyperlipidemia LDL goal <100 11/26/2014  . Acute medial meniscal tear 07/16/2014  . Lumbosacral spondylosis without  myelopathy 02/17/2014  . Spondylosis, lumbosacral 02/17/2014    Percival Spanish, PT, MPT 06/27/2017, 6:15 PM  [facility] [address]  [address] [place], [place], [address] Phone: [phone]   Fax:  [phone]  Name: Matthew Nash MRN: [id number] Date of Birth: [birth year]

## 2017-06-29 ENCOUNTER — Ambulatory Visit: Payer: Medicare Other | Admitting: Physical Therapy

## 2017-06-29 DIAGNOSIS — R2681 Unsteadiness on feet: Secondary | ICD-10-CM | POA: Diagnosis not present

## 2017-06-29 DIAGNOSIS — R296 Repeated falls: Secondary | ICD-10-CM

## 2017-06-29 DIAGNOSIS — R2689 Other abnormalities of gait and mobility: Secondary | ICD-10-CM

## 2017-06-29 NOTE — Therapy (Signed)
[facility] [address]  [place], [place], [address] Phone: [phone]   Fax:  [phone]  Physical Therapy Treatment  Patient Details  Name: Matthew Nash MRN: [id number] Date of Birth: 1936-05-27 Referring Provider: Dr. Lamar Blinks  Encounter Date: 06/29/2017      PT End of Session - 06/29/17 1410    Visit Number 3   Number of Visits 16   Date for PT Re-Evaluation 08/16/17   Authorization Type Medicare/Tricare   PT Start Time 1405   PT Stop Time 1445   PT Time Calculation (min) 40 min   Activity Tolerance Patient tolerated treatment well   Behavior During Therapy [facility] for tasks assessed/performed      Past Medical History:  Diagnosis Date  . Arthritis    both knees  . Back pain at L4-L5 level   . BPH (benign prostatic hyperplasia)    hx s/p turp  . Chronic back pain    spondylosis  . Chronic kidney disease    kidney stones 1979  . DDD (degenerative disc disease), lumbar   . Diabetes mellitus    takes Metformin and Victoza daily-under control  . Diarrhea    x 1 today  . Diverticulitis   . GERD (gastroesophageal reflux disease)    takes Omeprazole daily-under control  . H/O hiatal hernia   . H/O measles   . Hepatitis    B-with Mono and jaundice 1960  . History of chicken pox   . History of colon polyps   . History of kidney stones   . Hyperlipidemia    takes Simvastatin and Tricor daily-under control  . Hypertension    takes Lisinopril daily-under control  . Joint pain    both knees  . Joint swelling    rt knee  . Melanoma ([place])    right hand;basal cell carcinoma  . Neuropathy   . Peripheral vascular disease ([place])    diabetic neuropathy  . Pneumonia    hx of  . Prostate infection    currently taking macrobid  . Tinnitus   . Urinary incontinence   . UTI (lower urinary tract infection)     Past Surgical History:  Procedure Laterality Date  . BASAL CELL CARCINOMA EXCISION     . BOTOX INJECTION  03/22/2017   In the bladder for nightly incontinence.  . C5 and T1 bone chip removed   1986  . [place]  . CHOLECYSTECTOMY  1993  . COLONOSCOPY  2011  . cortisone injection    . EYE SURGERY     cataract sx 2017 bilateral  . I&D left knee Left 1958   states 22 times  . KNEE ARTHROSCOPY Right 07/16/2014   Procedure: RIGHT ARTHROSCOPY KNEE WITH Medial and Lateral DEBRIDEMENT and chondroplasty;  Surgeon: Gearlean Alf, MD;  Location: [facility];  Service: Orthopedics;  Laterality: Right;  . LUMBAR LAMINECTOMY/DECOMPRESSION MICRODISCECTOMY Right 02/17/2014   Procedure: RIGHT LUMBAR TWO-THREE LAMINECTOMY;  Surgeon: Charlie Pitter, MD;  Location: [facility];  Service: Neurosurgery;  Laterality: Right;  right   . right shoulder surgery  2012   rotator cuff repair  . SHOULDER ARTHROSCOPY W/ ROTATOR CUFF REPAIR Left NOV 2010  . TONSILLECTOMY  1942  . TOTAL KNEE ARTHROPLASTY Right 05/27/2015   Procedure: RIGHT TOTAL KNEE ARTHROPLASTY;  Surgeon: Gaynelle Arabian, MD;  [facility];  Service: Orthopedics;  Laterality: Right;  . TOTAL KNEE ARTHROPLASTY Left 05/30/2016  Procedure: LEFT TOTAL KNEE ARTHROPLASTY;  Surgeon: Gaynelle Arabian, MD;  [facility];  Service: Orthopedics;  Laterality: Left;  . TRANSURETHRAL RESECTION OF PROSTATE  2006  . VASECTOMY  1983  . WISDOM TOOTH EXTRACTION      There were no vitals filed for this visit.      Subjective Assessment - 06/29/17 1408    Subjective no falls this week - does report unsteadiness daily   Pertinent History DM 2 (controlled), HTN (controlled), neuropathy   Patient Stated Goals improve balance   Currently in Pain? No/denies   Pain Score 0-No pain                         [facility] Adult PT Treatment/Exercise - 06/29/17 1411      Neuro Re-ed    Neuro Re-ed Details  Narrow BOS with bean bag tossing 1 x 12 each side; beach ball taps x 4 minutes with CGA-Min A for balance; SL stance with LE on  pebble 4 x 10-20 sec each LE; standing alternating toe taps on 6" step x 20 each     Knee/Hip Exercises: Aerobic   Nustep lvl 6 x 8 minutes (LE only)     Knee/Hip Exercises: Seated   Long Arc Quad Right;Left;15 reps;Weights   Long Arc Quad Weight 3 lbs.                  PT Short Term Goals - 06/27/17 1503      PT SHORT TERM GOAL #1   Title patient to be independent with HEP (all STGs to be met by 07/19/17)   Status On-going     PT SHORT TERM GOAL #2   Title Patient to improve Berg to 32/56 demonstrating improved balance   Status On-going           PT Long Term Goals - 06/27/17 1505      PT LONG TERM GOAL #1   Title patient to be independent with advanced HEP (all goals to be met by 08/16/17)   Status On-going     PT LONG TERM GOAL #2   Title Patient to improve Berg to 44/56 demonstrating reduced fall risk    Status On-going     PT LONG TERM GOAL #3   Title Patient to improve gait velocity to >/= 2.62 ft/sec with no instability or LOB    Status On-going     PT LONG TERM GOAL #4   Title Patient to demonstrate ability to ambulate on various indoor/outdoor surfaces without LOB for improved functional mobility    Status On-going     PT LONG TERM GOAL #5   Title Patient to report no falls for greater than 2 weeks    Status On-going               Plan - 06/29/17 1410    Clinical Impression Statement Patient doing well today with no reported flals, but does report continued unsteadiness. patient curious about addition of LE weights during exercise with PT instructing patient to begin at 1# and work upwards with safety and comfort being key prior to progressing weight. Patient doing well with reaching outside of BOS today with good stepping strategy. WIll continue to progress towards goals.    PT Treatment/Interventions ADLs/Self Care Home Management;Cryotherapy;Electrical Stimulation;Moist Heat;Ultrasound;Neuromuscular re-education;Balance training;Therapeutic  exercise;Therapeutic activities;Functional mobility training;Stair training;Gait training;Patient/family education;Vasopneumatic Device;Taping   Consulted and Agree with Plan of Care Patient  Patient will benefit from skilled therapeutic intervention in order to improve the following deficits and impairments:  Abnormal gait, Decreased balance, Decreased mobility, Difficulty walking  Visit Diagnosis: Repeated falls  Unsteadiness on feet  Other abnormalities of gait and mobility     Problem List Patient Active Problem List   Diagnosis Date Noted  . Peripheral neuropathy 10/20/2016  . Bladder outlet obstruction 05/02/2016  . OA (osteoarthritis) of knee 05/27/2015  . Diabetes mellitus type II, controlled ([place]) 11/26/2014  . Gastroesophageal reflux disease without esophagitis 11/26/2014  . Arthritis of both knees 11/26/2014  . Absence of bladder continence 11/26/2014  . Hyperlipidemia LDL goal <100 11/26/2014  . Acute medial meniscal tear 07/16/2014  . Lumbosacral spondylosis without myelopathy 02/17/2014  . Spondylosis, lumbosacral 02/17/2014     Lanney Gins, PT, DPT 06/29/17 3:06 PM   [facility] [address]  [place], [place], [address] Phone: [phone]   Fax:  [phone]  Name: Matthew Nash MRN: [id number] Date of Birth: 01-11-1936

## 2017-07-03 ENCOUNTER — Encounter (HOSPITAL_BASED_OUTPATIENT_CLINIC_OR_DEPARTMENT_OTHER): Payer: Self-pay | Admitting: *Deleted

## 2017-07-03 ENCOUNTER — Emergency Department (HOSPITAL_BASED_OUTPATIENT_CLINIC_OR_DEPARTMENT_OTHER): Payer: Medicare Other

## 2017-07-03 ENCOUNTER — Telehealth: Payer: Self-pay | Admitting: Family Medicine

## 2017-07-03 ENCOUNTER — Emergency Department (HOSPITAL_BASED_OUTPATIENT_CLINIC_OR_DEPARTMENT_OTHER)
Admission: EM | Admit: 2017-07-03 | Discharge: 2017-07-03 | Disposition: A | Discharge status: Home / Self Care | Payer: Medicare Other | Attending: Emergency Medicine | Admitting: Emergency Medicine

## 2017-07-03 DIAGNOSIS — Z85828 Personal history of other malignant neoplasm of skin: Secondary | ICD-10-CM | POA: Diagnosis not present

## 2017-07-03 DIAGNOSIS — E1122 Type 2 diabetes mellitus with diabetic chronic kidney disease: Secondary | ICD-10-CM | POA: Diagnosis not present

## 2017-07-03 DIAGNOSIS — Z87891 Personal history of nicotine dependence: Secondary | ICD-10-CM | POA: Diagnosis not present

## 2017-07-03 DIAGNOSIS — Z7984 Long term (current) use of oral hypoglycemic drugs: Secondary | ICD-10-CM | POA: Diagnosis not present

## 2017-07-03 DIAGNOSIS — N189 Chronic kidney disease, unspecified: Secondary | ICD-10-CM | POA: Insufficient documentation

## 2017-07-03 DIAGNOSIS — I129 Hypertensive chronic kidney disease with stage 1 through stage 4 chronic kidney disease, or unspecified chronic kidney disease: Secondary | ICD-10-CM | POA: Diagnosis not present

## 2017-07-03 DIAGNOSIS — R079 Chest pain, unspecified: Secondary | ICD-10-CM | POA: Diagnosis not present

## 2017-07-03 DIAGNOSIS — R350 Frequency of micturition: Secondary | ICD-10-CM | POA: Diagnosis present

## 2017-07-03 DIAGNOSIS — N3000 Acute cystitis without hematuria: Secondary | ICD-10-CM | POA: Insufficient documentation

## 2017-07-03 DIAGNOSIS — Z79899 Other long term (current) drug therapy: Secondary | ICD-10-CM | POA: Diagnosis not present

## 2017-07-03 LAB — URINALYSIS, ROUTINE W REFLEX MICROSCOPIC
BILIRUBIN URINE: NEGATIVE
Glucose, UA: NEGATIVE mg/dL
Hgb urine dipstick: NEGATIVE
KETONES UR: NEGATIVE mg/dL
NITRITE: NEGATIVE
Protein, ur: NEGATIVE mg/dL
Specific Gravity, Urine: 1.024 (ref 1.005–1.030)
pH: 7 (ref 5.0–8.0)

## 2017-07-03 LAB — COMPREHENSIVE METABOLIC PANEL
ALK PHOS: 39 U/L (ref 38–126)
ALT: 24 U/L (ref 17–63)
AST: 18 U/L (ref 15–41)
Albumin: 4.1 g/dL (ref 3.5–5.0)
Anion gap: 9 (ref 5–15)
BILIRUBIN TOTAL: 1.2 mg/dL (ref 0.3–1.2)
BUN: 20 mg/dL (ref 6–20)
CALCIUM: 9.6 mg/dL (ref 8.9–10.3)
CHLORIDE: 105 mmol/L (ref 101–111)
CO2: 25 mmol/L (ref 22–32)
CREATININE: 1.19 mg/dL (ref 0.61–1.24)
GFR, EST NON AFRICAN AMERICAN: 56 mL/min — AB (ref >60)
Glucose, Bld: 123 mg/dL — ABNORMAL HIGH (ref 65–99)
Potassium: 4 mmol/L (ref 3.5–5.1)
Sodium: 139 mmol/L (ref 135–145)
TOTAL PROTEIN: 7.5 g/dL (ref 6.5–8.1)

## 2017-07-03 LAB — URINALYSIS, MICROSCOPIC (REFLEX)

## 2017-07-03 LAB — CBC WITH DIFFERENTIAL/PLATELET
BASOS ABS: 0 10*3/uL (ref 0.0–0.1)
BASOS PCT: 0 %
EOS ABS: 0 10*3/uL (ref 0.0–0.7)
EOS PCT: 0 %
HCT: 40 % (ref 39.0–52.0)
HEMOGLOBIN: 13.2 g/dL (ref 13.0–17.0)
Lymphocytes Relative: 6 %
Lymphs Abs: 0.9 10*3/uL (ref 0.7–4.0)
MCH: 28.6 pg (ref 26.0–34.0)
MCHC: 33 g/dL (ref 30.0–36.0)
MCV: 86.8 fL (ref 78.0–100.0)
Monocytes Absolute: 1.2 10*3/uL — ABNORMAL HIGH (ref 0.1–1.0)
Monocytes Relative: 8 %
NEUTROS PCT: 86 %
Neutro Abs: 12.3 10*3/uL — ABNORMAL HIGH (ref 1.7–7.7)
PLATELETS: 203 10*3/uL (ref 150–400)
RBC: 4.61 MIL/uL (ref 4.22–5.81)
RDW: 14.7 % (ref 11.5–15.5)
WBC: 14.4 10*3/uL — AB (ref 4.0–10.5)

## 2017-07-03 LAB — I-STAT CG4 LACTIC ACID, ED: LACTIC ACID, VENOUS: 1.51 mmol/L (ref 0.5–1.9)

## 2017-07-03 MED ORDER — ACETAMINOPHEN 325 MG PO TABS
ORAL_TABLET | ORAL | Status: AC
  Filled 2017-07-03: qty 2

## 2017-07-03 MED ORDER — ACETAMINOPHEN 325 MG PO TABS
650.0000 mg | ORAL_TABLET | Freq: Once | ORAL | Status: AC
  Administered 2017-07-03: 650 mg via ORAL

## 2017-07-03 MED ORDER — DEXTROSE 5 % IV SOLN
2.0000 g | Freq: Once | INTRAVENOUS | Status: AC
  Administered 2017-07-03: 2 g via INTRAVENOUS
  Filled 2017-07-03: qty 2

## 2017-07-03 MED ORDER — SODIUM CHLORIDE 0.9 % IV BOLUS (SEPSIS)
500.0000 mL | Freq: Once | INTRAVENOUS | Status: AC
  Administered 2017-07-03: 17:00:00 via INTRAVENOUS

## 2017-07-03 MED ORDER — SULFAMETHOXAZOLE-TRIMETHOPRIM 800-160 MG PO TABS: 1.0000 | ORAL_TABLET | Freq: Two times a day (BID) | ORAL | 0 refills | Status: AC

## 2017-07-03 MED ORDER — IBUPROFEN 400 MG PO TABS
400.0000 mg | ORAL_TABLET | Freq: Once | ORAL | Status: AC
  Administered 2017-07-03: 400 mg via ORAL
  Filled 2017-07-03: qty 1

## 2017-07-03 MED ORDER — AZTREONAM 1 G IJ SOLR
INTRAMUSCULAR | Status: AC
  Filled 2017-07-03: qty 2

## 2017-07-03 MED ORDER — DEXTROSE 5 % IV SOLN: 1.0000 g | Freq: Three times a day (TID) | INTRAVENOUS | Status: DC

## 2017-07-03 MED ORDER — LEVOFLOXACIN IN D5W 750 MG/150ML IV SOLN
750.0000 mg | Freq: Once | INTRAVENOUS | Status: AC
  Administered 2017-07-03: 750 mg via INTRAVENOUS
  Filled 2017-07-03: qty 150

## 2017-07-03 MED ORDER — ACETAMINOPHEN 500 MG PO TABS: 1000.0000 mg | ORAL_TABLET | Freq: Once | ORAL | Status: DC

## 2017-07-03 MED ORDER — CEPHALEXIN 500 MG PO CAPS: 500.0000 mg | ORAL_CAPSULE | Freq: Two times a day (BID) | ORAL | 0 refills | Status: DC

## 2017-07-03 MED ORDER — LEVOFLOXACIN IN D5W 750 MG/150ML IV SOLN: 750.0000 mg | INTRAVENOUS | Status: DC

## 2017-07-03 NOTE — ED Notes (Signed)
ED Provider at bedside. 

## 2017-07-03 NOTE — ED Notes (Signed)
Patient transported to X-ray 

## 2017-07-03 NOTE — Progress Notes (Signed)
Pharmacy Antibiotic Note Matthew Nash is a 81 y.o. male admitted on 07/03/2017 with UTI.  Pharmacy has been consulted for Azactam and Levaquin dosing.  Plan: 1. Azactam 1 gram IV every 8 hours 2. Levaquin 750 mg IV every 24 hours 3. If IV antibiotics continue would consider use of cephalosporin as pt has tolerated in past  Height: 5\' 11"  (180.3 cm) Weight: 227 lb (103 kg) IBW/kg (Calculated) : 75.3  Temp (24hrs), Avg:99.7 F (37.6 C), Min:99.7 F (37.6 C), Max:99.7 F (37.6 C)  No results for input(s): WBC, CREATININE, LATICACIDVEN, VANCOTROUGH, VANCOPEAK, VANCORANDOM, GENTTROUGH, GENTPEAK, GENTRANDOM, TOBRATROUGH, TOBRAPEAK, TOBRARND, AMIKACINPEAK, AMIKACINTROU, AMIKACIN in the last 168 hours.  CrCl cannot be calculated (Patient's most recent lab result is older than the maximum 21 days allowed.).    Allergies  Allergen Reactions  . Albamycin [Novobiocin] Other (See Comments)    Made me look like a strawberry   . Penicillins Rash and Other (See Comments)    Has patient had a PCN reaction causing immediate rash, facial/tongue/throat swelling, SOB or lightheadedness with hypotension:  no Has patient had a PCN reaction causing severe rash involving mucus membranes or skin necrosis: no Has patient had a PCN reaction that required hospitalization no Has patient had a PCN reaction occurring within the last 10 years: no - childhood reaction If all of the above answers are "NO", then may proceed with Cephalosporin use.     Thank you for allowing pharmacy to be a part of this patient's care.  Vincenza Hews, PharmD, BCPS 07/03/2017, 4:14 PM

## 2017-07-03 NOTE — Telephone Encounter (Signed)
Pt called in to schedule an apt with PCP. Pt says that he checked his temp: in his ear it reads 104.3, under his tongue it reads 101. Pt says that he think that he have a possible UTI or bladder infection. I offered to schedule pt with a different provider due to his PCP being out of the office. Pt declined and stated that he will go to the ER.

## 2017-07-03 NOTE — ED Provider Notes (Addendum)
[facility] DEPT [facility] Provider Note   CSN: [id number] Arrival date & time: 07/03/17  1428  By signing my name below, I, Matthew Nash, attest that this documentation has been prepared under the direction and in the presence of physician practitioner, Malvin Johns, MD. Electronically Signed: [name], Scribe. 07/03/2017. 3:32 PM.  History   Chief Complaint Chief Complaint  Patient presents with  . Urinary Frequency    fever   The history is provided by the patient. No language interpreter was used.    HPI Comments: Matthew Nash is a 81 y.o. male with PMHx including BPH, CKD, DM, HTN, and recurrent UTI's who presents to the Emergency Department complaining of persistent urinary frequency and bladder pressure beginning yesterday. Patient has self-catheterized twice daily since a transurethral prostate resection in 2006 and cannot urinate on his own. Patient uses a 44 [place]. He states that his bladder feels full currently and notes that he catheterizes around this time each day. He reports some acute fatigue along with intermittent fevers (tmax 101.5 orally) and chills beginning today. There are no alleviating factors noted. He states that he will typically have a UTI once every 3-4 months. Patient does note that he received a Botox injection in the bladder several years ago to help with urinary incontinence and has not received any additional injections since then. He denies dysuria, nausea, vomiting, cough, congestion, rhinorrhea, rashes, skin wounds, sore throat, or any other associated symptoms.  Past Medical History:  Diagnosis Date  . Arthritis    both knees  . Back pain at L4-L5 level   . BPH (benign prostatic hyperplasia)    hx s/p turp  . Chronic back pain    spondylosis  . Chronic kidney disease    kidney stones 1979  . DDD (degenerative disc disease), lumbar   . Diabetes mellitus    takes Metformin and Victoza daily-under control  . Diarrhea    x 1 today  .  Diverticulitis   . GERD (gastroesophageal reflux disease)    takes Omeprazole daily-under control  . H/O hiatal hernia   . H/O measles   . Hepatitis    B-with Mono and jaundice 1960  . History of chicken pox   . History of colon polyps   . History of kidney stones   . Hyperlipidemia    takes Simvastatin and Tricor daily-under control  . Hypertension    takes Lisinopril daily-under control  . Joint pain    both knees  . Joint swelling    rt knee  . Melanoma ([place])    right hand;basal cell carcinoma  . Neuropathy   . Peripheral vascular disease ([place])    diabetic neuropathy  . Pneumonia    hx of  . Prostate infection    currently taking macrobid  . Tinnitus   . Urinary incontinence   . UTI (lower urinary tract infection)     Patient Active Problem List   Diagnosis Date Noted  . Peripheral neuropathy 10/20/2016  . Bladder outlet obstruction 05/02/2016  . OA (osteoarthritis) of knee 05/27/2015  . Diabetes mellitus type II, controlled ([place]) 11/26/2014  . Gastroesophageal reflux disease without esophagitis 11/26/2014  . Arthritis of both knees 11/26/2014  . Absence of bladder continence 11/26/2014  . Hyperlipidemia LDL goal <100 11/26/2014  . Acute medial meniscal tear 07/16/2014  . Lumbosacral spondylosis without myelopathy 02/17/2014  . Spondylosis, lumbosacral 02/17/2014    Past Surgical History:  Procedure Laterality Date  . BASAL CELL CARCINOMA EXCISION    .  BOTOX INJECTION  03/22/2017   In the bladder for nightly incontinence.  . C5 and T1 bone chip removed   1986  . [place]  . CHOLECYSTECTOMY  1993  . COLONOSCOPY  2011  . cortisone injection    . EYE SURGERY     cataract sx 2017 bilateral  . I&D left knee Left 1958   states 22 times  . KNEE ARTHROSCOPY Right 07/16/2014   Procedure: RIGHT ARTHROSCOPY KNEE WITH Medial and Lateral DEBRIDEMENT and chondroplasty;  Surgeon: Gearlean Alf, MD;  [facility];  Service: Orthopedics;   Laterality: Right;  . LUMBAR LAMINECTOMY/DECOMPRESSION MICRODISCECTOMY Right 02/17/2014   Procedure: RIGHT LUMBAR TWO-THREE LAMINECTOMY;  Surgeon: Charlie Pitter, MD;  Location: [facility];  Service: Neurosurgery;  Laterality: Right;  right   . right shoulder surgery  2012   rotator cuff repair  . SHOULDER ARTHROSCOPY W/ ROTATOR CUFF REPAIR Left NOV 2010  . TONSILLECTOMY  1942  . TOTAL KNEE ARTHROPLASTY Right 05/27/2015   Procedure: RIGHT TOTAL KNEE ARTHROPLASTY;  Surgeon: Gaynelle Arabian, MD;  [facility];  Service: Orthopedics;  Laterality: Right;  . TOTAL KNEE ARTHROPLASTY Left 05/30/2016   Procedure: LEFT TOTAL KNEE ARTHROPLASTY;  Surgeon: Gaynelle Arabian, MD;  [facility];  Service: Orthopedics;  Laterality: Left;  . TRANSURETHRAL RESECTION OF PROSTATE  2006  . VASECTOMY  1983  . WISDOM TOOTH EXTRACTION         Home Medications    Prior to Admission medications   Medication Sig Start Date End Date Taking? Authorizing Provider  Probiotic Product (PROBIOTIC ADVANCED PO) Take by mouth.   Yes [provider]  CALCIUM-MAGNESIUM-VITAMIN D PO Take by mouth.    [provider]  celecoxib (CELEBREX) 200 MG capsule TAKE 1 CAPSULE TWICE A DAY 01/02/17   Copland, Gay Filler, MD  cephALEXin (KEFLEX) 500 MG capsule Take 1 capsule (500 mg total) by mouth 2 (two) times daily. 07/03/17   Malvin Johns, MD  fenofibrate (TRICOR) 145 MG tablet Take 1 tablet (145 mg total) by mouth every evening. 11/11/16   Copland, Gay Filler, MD  FIBER PO Take 3 capsules by mouth 2 (two) times daily.    [provider]  glucose blood (FREESTYLE LITE) test strip Use as instructed to test Blood Glucose twice daily Dx: E11.9 02/08/17   Copland, Gay Filler, MD  Insulin Pen Needle (CAREFINE PEN NEEDLES) 31G X 8 MM MISC Use once daily with Victoza injections. 07/05/16   Copland, Gay Filler, MD  Lancets (FREESTYLE) lancets USE 1 LANCET TWICE A DAY AS INSTRUCTED 01/29/17   Copland, Gay Filler, MD    lisinopril (PRINIVIL,ZESTRIL) 5 MG tablet Take 1 tablet (5 mg total) by mouth every evening. 02/17/17   Copland, Gay Filler, MD  metFORMIN (GLUCOPHAGE-XR) 500 MG 24 hr tablet TAKE 1 TABLET TWICE A DAY WITH MEALS 05/25/17   Copland, Gay Filler, MD  Multiple Vitamin (MULTIVITAMIN) tablet Take 1 tablet by mouth daily.    [provider]  omeprazole (PRILOSEC) 20 MG capsule TAKE 1 CAPSULE DAILY 06/15/17   Copland, Gay Filler, MD  Probiotic Product (PROBIOTIC PO) Take 1 capsule by mouth daily.    [provider]  simvastatin (ZOCOR) 10 MG tablet Take 1 tablet (10 mg total) by mouth at bedtime. 02/17/17   Copland, Gay Filler, MD    Family History Family History  Problem Relation Age of Onset  . Heart disease Mother 42  Deceased  . Bladder Cancer Mother   . Prostate cancer Father        Deceased-in 13s  . Healthy Brother   . Skin cancer Brother        "basal cell cancer in the bloodstream"  . Asthma Sister        Deceased  . Healthy Son        #1  . Healthy Daughter        #2  . Obesity Son        #2    Social History Social History  Substance Use Topics  . Smoking status: Former Smoker    Types: Pipe    Quit date: 07/14/1978  . Smokeless tobacco: Never Used     Comment: quit smoking 1979  . Alcohol use Yes     Comment: OCCASIONAL     Allergies   Albamycin [novobiocin] and Penicillins   Review of Systems Review of Systems  Constitutional: Positive for chills, fatigue and fever. Negative for diaphoresis.  HENT: Negative for congestion, rhinorrhea, sneezing and sore throat.   Eyes: Negative.   Respiratory: Negative for cough, chest tightness and shortness of breath.   Cardiovascular: Negative for chest pain and leg swelling.  Gastrointestinal: Positive for abdominal pain ("bladder pressure"). Negative for blood in stool, diarrhea, nausea and vomiting.  Genitourinary: Positive for difficulty urinating (baseline), dysuria and frequency. Negative for flank pain  and hematuria.  Musculoskeletal: Negative for arthralgias and back pain.  Skin: Negative for rash.  Neurological: Negative for dizziness, speech difficulty, weakness, numbness and headaches.   Physical Exam Updated Vital Signs BP 120/65   Pulse 92   Temp (!) 100.4 F (38 C) (Oral)   Resp 13   Ht _0  (1.803 m)   Wt 103 kg (227 lb)   SpO2 94%   BMI 31.66 kg/m   Physical Exam  Constitutional: He is oriented to person, place, and time. He appears well-developed and well-nourished.  HENT:  Head: Normocephalic and atraumatic.  Eyes: Pupils are equal, round, and reactive to light.  Neck: Normal range of motion. Neck supple.  Cardiovascular: Regular rhythm and normal heart sounds.  Tachycardia present.   Pulmonary/Chest: Effort normal and breath sounds normal. No respiratory distress. He has no wheezes. He has no rales. He exhibits no tenderness.  Abdominal: Soft. Bowel sounds are normal. There is no tenderness. There is no rebound and no guarding.  Musculoskeletal: Normal range of motion. He exhibits no edema.  Lymphadenopathy:    He has no cervical adenopathy.  Neurological: He is alert and oriented to person, place, and time.  Skin: Skin is warm and dry. No rash noted.  Psychiatric: He has a normal mood and affect.   ED Treatments / Results  Labs (all labs ordered are listed, but only abnormal results are displayed) Labs Reviewed  URINALYSIS, ROUTINE W REFLEX MICROSCOPIC - Abnormal; Notable for the following:       Result Value   Leukocytes, UA SMALL (*)    All other components within normal limits  COMPREHENSIVE METABOLIC PANEL - Abnormal; Notable for the following:    Glucose, Bld 123 (*)    GFR calc non Af Amer 56 (*)    All other components within normal limits  CBC WITH DIFFERENTIAL/PLATELET - Abnormal; Notable for the following:    WBC 14.4 (*)    Neutro Abs 12.3 (*)    Monocytes Absolute 1.2 (*)    All other components within normal limits  URINALYSIS,  MICROSCOPIC (REFLEX) - Abnormal; Notable for the following:    Bacteria, UA FEW (*)    Squamous Epithelial / LPF 0-5 (*)    All other components within normal limits  CULTURE, BLOOD (ROUTINE X 2)  CULTURE, BLOOD (ROUTINE X 2)  URINE CULTURE  I-STAT CG4 LACTIC ACID, ED  I-STAT CG4 LACTIC ACID, ED    EKG  EKG Interpretation  Date/Time:  Monday July 03 2017 15:50:10 EDT Ventricular Rate:  110 PR Interval:    QRS Duration: 94 QT Interval:  340 QTC Calculation: 460 R Axis:   33 Text Interpretation:  Sinus tachycardia Ventricular premature complex Low voltage, precordial leads Borderline ST depression, lateral leads Baseline wander in lead(s) III V6 SINCE LAST TRACING HEART RATE HAS INCREASED Confirmed by Malvin Johns [phone]) on 07/03/2017 3:59:21 PM       Radiology Dg Chest 2 View  Result Date: 07/03/2017 CLINICAL DATA:  SHOB, fever, no chest pain. Extensive history. Concern for sepsis. EXAM: CHEST  2 VIEW COMPARISON:  Chest x-ray dated 06/13/2016. FINDINGS: Heart size and mediastinal contours are normal. Stable mild scarring/atelectasis at each lung base. No new lung findings. No pleural effusion or pneumothorax seen. No acute or suspicious osseous finding. IMPRESSION: No active cardiopulmonary disease. No evidence of pneumonia or pulmonary edema. Electronically Signed   By: Franki Cabot M.D.   On: 07/03/2017 15:49    Procedures Procedures (including critical care time)  DIAGNOSTIC STUDIES: Oxygen Saturation is 95% on RA, adequate by my interpretation.    COORDINATION OF CARE: 3:29 PM Discussed treatment plan with pt at bedside and pt agreed to plan.  Medications Ordered in ED Medications  aztreonam (AZACTAM) 1 g in dextrose 5 % 50 mL IVPB (not administered)  levofloxacin (LEVAQUIN) IVPB 750 mg (not administered)  aztreonam (AZACTAM) 1 g injection (  Not Given 07/03/17 1645)  acetaminophen (TYLENOL) tablet 1,000 mg (not administered)  levofloxacin (LEVAQUIN) IVPB 750 mg (0  mg Intravenous Stopped 07/03/17 1941)  aztreonam (AZACTAM) 2 g in dextrose 5 % 50 mL IVPB (0 g Intravenous Stopped 07/03/17 1724)  sodium chloride 0.9 % bolus 500 mL (0 mLs Intravenous Stopped 07/03/17 1755)  acetaminophen (TYLENOL) tablet 650 mg (650 mg Oral Given 07/03/17 1651)  ibuprofen (ADVIL,MOTRIN) tablet 400 mg (400 mg Oral Given 07/03/17 1755)     Initial Impression / Assessment and Plan / ED Course  I have reviewed the triage vital signs and the nursing notes.  Pertinent labs & imaging results that were available during my care of the patient were reviewed by me and considered in my medical decision making (see chart for details).     Patient presents with fever, chills and urinary symptoms. He does have evidence of urinary tract infection. His white blood cell count is elevated. He is febrile. He met initial signs of sepsis given his tachycardia and fever. However his lactate is normal. His tachycardia resolved with ibuprofen and Tylenol. He is alert and oriented and feeling better in the ED after treatment. He was given initial antibiotics for possible sepsis including Levaquin and Azactam. Given his current state, I feel that he can have a trial of outpatient treatment. He is comfortable with this plan. He doesn't want to be admitted at this time. I reviewed his prior cultures. I will start him on bactrim. He is on daily prophylactic dose of Keflex.  He was discharged home in good condition. I did advise him to return for any worsening symptoms which she is agreeable to. He  states he only lives a mile from here. He has a follow-up appointment with his urologist next week. Strict return precautions were given.  Final Clinical Impressions(s) / ED Diagnoses   Final diagnoses:  Acute cystitis without hematuria    New Prescriptions New Prescriptions   CEPHALEXIN (KEFLEX) 500 MG CAPSULE    Take 1 capsule (500 mg total) by mouth 2 (two) times daily.   I personally performed the services  described in this documentation, which was scribed in my presence.  The recorded information has been reviewed and considered.    Malvin Johns, MD 07/03/17 [place]    Malvin Johns, MD 07/03/17 2007

## 2017-07-03 NOTE — ED Triage Notes (Signed)
Pt c/o freq urination and pressure and fever x 1 day

## 2017-07-04 ENCOUNTER — Ambulatory Visit: Payer: Medicare Other | Admitting: Physical Therapy

## 2017-07-05 LAB — URINE CULTURE

## 2017-07-06 ENCOUNTER — Ambulatory Visit: Payer: Medicare Other | Admitting: Physical Therapy

## 2017-07-06 DIAGNOSIS — R2689 Other abnormalities of gait and mobility: Secondary | ICD-10-CM

## 2017-07-06 DIAGNOSIS — R296 Repeated falls: Secondary | ICD-10-CM

## 2017-07-06 DIAGNOSIS — R2681 Unsteadiness on feet: Secondary | ICD-10-CM

## 2017-07-06 NOTE — Telephone Encounter (Signed)
Post ED Visit - Positive Culture Follow-up: Successful Patient Follow-Up  Culture assessed and recommendations reviewed by: []  Elenor Quinones, Pharm.D. []  Heide Guile, Pharm.D., BCPS AQ-ID [x]  Parks Neptune, Pharm.D., BCPS []  Alycia Rossetti, Pharm.D., BCPS []  [place], [place].D., BCPS, AAHIVP []  Legrand Como, Pharm.D., BCPS, AAHIVP []  Salome Arnt, PharmD, BCPS []  Dimitri Ped, PharmD, BCPS []  Vincenza Hews, PharmD, BCPS  Positive urine culture  []  Patient discharged without antimicrobial prescription and treatment is now indicated [x]  Organism is resistant to prescribed ED discharge antimicrobial []  Patient with positive blood cultures  Changes discussed with ED provider: McDonald PA New antibiotic prescription patient will stop Bactrim, start Levofloxacin 250mg  po daily x 1 week, pt will continue Keflex daily prescribed by Dr. Kirby Funk to [place] patient, 07/06/17 1355   Hazle Nordmann 07/06/2017, 1:53 PM

## 2017-07-06 NOTE — Therapy (Signed)
[facility] [address]  [place], [place], [address] Phone: [phone]   Fax:  [phone]  Physical Therapy Treatment  Patient Details  Name: Matthew Nash MRN: [id number] Date of Birth: 07-Jul-1936 Referring Provider: Dr. Lamar Blinks  Encounter Date: 07/06/2017      PT End of Session - 07/06/17 1641    Visit Number 4   Number of Visits 16   Date for PT Re-Evaluation 08/16/17   Authorization Type Medicare/Tricare   PT Start Time 1451   PT Stop Time 1531   PT Time Calculation (min) 40 min   Activity Tolerance Patient tolerated treatment well   Behavior During Therapy [facility] for tasks assessed/performed      Past Medical History:  Diagnosis Date  . Arthritis    both knees  . Back pain at L4-L5 level   . BPH (benign prostatic hyperplasia)    hx s/p turp  . Chronic back pain    spondylosis  . Chronic kidney disease    kidney stones 1979  . DDD (degenerative disc disease), lumbar   . Diabetes mellitus    takes Metformin and Victoza daily-under control  . Diarrhea    x 1 today  . Diverticulitis   . GERD (gastroesophageal reflux disease)    takes Omeprazole daily-under control  . H/O hiatal hernia   . H/O measles   . Hepatitis    B-with Mono and jaundice 1960  . History of chicken pox   . History of colon polyps   . History of kidney stones   . Hyperlipidemia    takes Simvastatin and Tricor daily-under control  . Hypertension    takes Lisinopril daily-under control  . Joint pain    both knees  . Joint swelling    rt knee  . Melanoma ([place])    right hand;basal cell carcinoma  . Neuropathy   . Peripheral vascular disease ([place])    diabetic neuropathy  . Pneumonia    hx of  . Prostate infection    currently taking macrobid  . Tinnitus   . Urinary incontinence   . UTI (lower urinary tract infection)     Past Surgical History:  Procedure Laterality Date  . BASAL CELL CARCINOMA EXCISION     . BOTOX INJECTION  03/22/2017   In the bladder for nightly incontinence.  . C5 and T1 bone chip removed   1986  . [place]  . CHOLECYSTECTOMY  1993  . COLONOSCOPY  2011  . cortisone injection    . EYE SURGERY     cataract sx 2017 bilateral  . I&D left knee Left 1958   states 22 times  . KNEE ARTHROSCOPY Right 07/16/2014   Procedure: RIGHT ARTHROSCOPY KNEE WITH Medial and Lateral DEBRIDEMENT and chondroplasty;  Surgeon: Gearlean Alf, MD;  [facility];  Service: Orthopedics;  Laterality: Right;  . LUMBAR LAMINECTOMY/DECOMPRESSION MICRODISCECTOMY Right 02/17/2014   Procedure: RIGHT LUMBAR TWO-THREE LAMINECTOMY;  Surgeon: Charlie Pitter, MD;  Location: [place] [facility];  Service: Neurosurgery;  Laterality: Right;  right   . right shoulder surgery  2012   rotator cuff repair  . SHOULDER ARTHROSCOPY W/ ROTATOR CUFF REPAIR Left NOV 2010  . TONSILLECTOMY  1942  . TOTAL KNEE ARTHROPLASTY Right 05/27/2015   Procedure: RIGHT TOTAL KNEE ARTHROPLASTY;  Surgeon: Gaynelle Arabian, MD;  [facility];  Service: Orthopedics;  Laterality: Right;  . TOTAL KNEE ARTHROPLASTY Left 05/30/2016  Procedure: LEFT TOTAL KNEE ARTHROPLASTY;  Surgeon: Gaynelle Arabian, MD;  [facility];  Service: Orthopedics;  Laterality: Left;  . TRANSURETHRAL RESECTION OF PROSTATE  2006  . VASECTOMY  1983  . WISDOM TOOTH EXTRACTION      There were no vitals filed for this visit.      Subjective Assessment - 07/06/17 1634    Subjective Patient reporting he went to ED for UTI; reports some things are not going well but does not wish to discuss.    Pertinent History DM 2 (controlled), HTN (controlled), neuropathy   Patient Stated Goals improve balance   Currently in Pain? No/denies   Pain Score 0-No pain                         [facility] Adult PT Treatment/Exercise - 07/06/17 0001      Neuro Re-ed    Neuro Re-ed Details  beach ball taps - stepping outside BOS x 3-4 minutes; narrow BOS on  foam 2 x 30 sec; tandem stance 1 x 60 sec each foot; SL stance with opposite foot on pebble 2 x 30 sec each foot; navigating environment and reaching for cones x 10 cones;     Knee/Hip Exercises: Aerobic   Nustep L4 x 7 min                  PT Short Term Goals - 06/27/17 1503      PT SHORT TERM GOAL #1   Title patient to be independent with HEP (all STGs to be met by 07/19/17)   Status On-going     PT SHORT TERM GOAL #2   Title Patient to improve Berg to 32/56 demonstrating improved balance   Status On-going           PT Long Term Goals - 06/27/17 1505      PT LONG TERM GOAL #1   Title patient to be independent with advanced HEP (all goals to be met by 08/16/17)   Status On-going     PT LONG TERM GOAL #2   Title Patient to improve Berg to 44/56 demonstrating reduced fall risk    Status On-going     PT LONG TERM GOAL #3   Title Patient to improve gait velocity to >/= 2.62 ft/sec with no instability or LOB    Status On-going     PT LONG TERM GOAL #4   Title Patient to demonstrate ability to ambulate on various indoor/outdoor surfaces without LOB for improved functional mobility    Status On-going     PT LONG TERM GOAL #5   Title Patient to report no falls for greater than 2 weeks    Status On-going               Plan - 07/06/17 1642    Clinical Impression Statement patient today with recent visit to ED due to UTI - continues to feel bad overall. Multiple rest breaks needed today as patient was seemingly out of breath. O2 sats checked multiples times throughout session with O2 remaining at 94-97% and HR 91-102. Will continue to progress as patient tolerates.     PT Treatment/Interventions ADLs/Self Care Home Management;Cryotherapy;Electrical Stimulation;Moist Heat;Ultrasound;Neuromuscular re-education;Balance training;Therapeutic exercise;Therapeutic activities;Functional mobility training;Stair training;Gait training;Patient/family education;Vasopneumatic  Device;Taping   Consulted and Agree with Plan of Care Patient      Patient will benefit from skilled therapeutic intervention in order to improve the following deficits and impairments:  Abnormal gait,  Decreased balance, Decreased mobility, Difficulty walking  Visit Diagnosis: Repeated falls  Unsteadiness on feet  Other abnormalities of gait and mobility     Problem List Patient Active Problem List   Diagnosis Date Noted  . Peripheral neuropathy 10/20/2016  . Bladder outlet obstruction 05/02/2016  . OA (osteoarthritis) of knee 05/27/2015  . Diabetes mellitus type II, controlled ([place]) 11/26/2014  . Gastroesophageal reflux disease without esophagitis 11/26/2014  . Arthritis of both knees 11/26/2014  . Absence of bladder continence 11/26/2014  . Hyperlipidemia LDL goal <100 11/26/2014  . Acute medial meniscal tear 07/16/2014  . Lumbosacral spondylosis without myelopathy 02/17/2014  . Spondylosis, lumbosacral 02/17/2014     Lanney Gins, PT, DPT 07/06/17 4:48 PM   [facility] [address]  [place], [place], [address] Phone: [phone]   Fax:  [phone]  Name: Matthew Nash MRN: [id number] Date of Birth: 1936-02-12

## 2017-07-06 NOTE — Progress Notes (Signed)
ED Antimicrobial Stewardship Positive Culture Follow Up   Matthew Nash is an 81 y.o. male who presented to [facility] on 07/03/2017 with a chief complaint of  Chief Complaint  Patient presents with  . Urinary Frequency    fever    Recent Results (from the past 720 hour(s))  Urine culture     Status: Abnormal   Collection Time: 07/03/17  3:20 PM  Result Value Ref Range Status   Specimen Description URINE, CATHETERIZED  Final   Special Requests NONE  Final   Culture 60,000 COLONIES/mL PSEUDOMONAS AERUGINOSA (A)  Final   Report Status 07/05/2017 FINAL  Final   Organism ID, Bacteria PSEUDOMONAS AERUGINOSA (A)  Final      Susceptibility   Pseudomonas aeruginosa - MIC*    CEFTAZIDIME 4 SENSITIVE Sensitive     CIPROFLOXACIN <=0.25 SENSITIVE Sensitive     GENTAMICIN <=1 SENSITIVE Sensitive     IMIPENEM 2 SENSITIVE Sensitive     PIP/TAZO 8 SENSITIVE Sensitive     CEFEPIME 2 SENSITIVE Sensitive     * 60,000 COLONIES/mL PSEUDOMONAS AERUGINOSA  Blood Culture (routine x 2)     Status: None (Preliminary result)   Collection Time: 07/03/17  4:20 PM  Result Value Ref Range Status   Specimen Description BLOOD BLOOD RIGHT ARM  Final   Special Requests   Final    BOTTLES DRAWN AEROBIC AND ANAEROBIC Blood Culture adequate volume   Culture   Final    NO GROWTH 2 DAYS Performed at [facility], [address], [place], [place] [address]    Report Status PENDING  Incomplete  Blood Culture (routine x 2)     Status: None (Preliminary result)   Collection Time: 07/03/17  4:35 PM  Result Value Ref Range Status   Specimen Description BLOOD BLOOD LEFT ARM  Final   Special Requests   Final    BOTTLES DRAWN AEROBIC AND ANAEROBIC Blood Culture adequate volume   Culture   Final    NO GROWTH 2 DAYS Performed at [facility], [place] [address], [place], [place] [address]    Report Status PENDING  Incomplete    [x]  Treated with cephalexin, bactrim, organism resistant to prescribed  antimicrobial []  Patient discharged originally without antimicrobial agent and treatment is now indicated  New antibiotic prescription: levofloxacin 250 mg qd x 1 week  ED Provider: Joline Maxcy, PA-C  Matthew Nash 07/06/2017, 8:27 AM Infectious Diseases Pharmacist Phone# [phone]

## 2017-07-08 LAB — CULTURE, BLOOD (ROUTINE X 2)
CULTURE: NO GROWTH
Culture: NO GROWTH
Special Requests: ADEQUATE
Special Requests: ADEQUATE

## 2017-07-11 ENCOUNTER — Ambulatory Visit: Payer: Medicare Other | Admitting: Physical Therapy

## 2017-07-11 DIAGNOSIS — R296 Repeated falls: Secondary | ICD-10-CM

## 2017-07-11 DIAGNOSIS — R2681 Unsteadiness on feet: Secondary | ICD-10-CM

## 2017-07-11 DIAGNOSIS — R2689 Other abnormalities of gait and mobility: Secondary | ICD-10-CM | POA: Diagnosis not present

## 2017-07-11 NOTE — Therapy (Signed)
[facility] [address]  [place], [place], [address] Phone: [phone]   Fax:  [phone]  Physical Therapy Treatment  Patient Details  Name: Matthew Nash MRN: [id number] Date of Birth: August 16, 1936 Referring Provider: Dr. Lamar Blinks  Encounter Date: 07/11/2017      PT End of Session - 07/11/17 1134    Visit Number 5   Number of Visits 16   Date for PT Re-Evaluation 08/16/17   Authorization Type Medicare/Tricare   PT Start Time 1016   PT Stop Time 1102   PT Time Calculation (min) 46 min   Activity Tolerance Patient tolerated treatment well   Behavior During Therapy [facility] for tasks assessed/performed      Past Medical History:  Diagnosis Date  . Arthritis    both knees  . Back pain at L4-L5 level   . BPH (benign prostatic hyperplasia)    hx s/p turp  . Chronic back pain    spondylosis  . Chronic kidney disease    kidney stones 1979  . DDD (degenerative disc disease), lumbar   . Diabetes mellitus    takes Metformin and Victoza daily-under control  . Diarrhea    x 1 today  . Diverticulitis   . GERD (gastroesophageal reflux disease)    takes Omeprazole daily-under control  . H/O hiatal hernia   . H/O measles   . Hepatitis    B-with Mono and jaundice 1960  . History of chicken pox   . History of colon polyps   . History of kidney stones   . Hyperlipidemia    takes Simvastatin and Tricor daily-under control  . Hypertension    takes Lisinopril daily-under control  . Joint pain    both knees  . Joint swelling    rt knee  . Melanoma ([place])    right hand;basal cell carcinoma  . Neuropathy   . Peripheral vascular disease ([place])    diabetic neuropathy  . Pneumonia    hx of  . Prostate infection    currently taking macrobid  . Tinnitus   . Urinary incontinence   . UTI (lower urinary tract infection)     Past Surgical History:  Procedure Laterality Date  . BASAL CELL CARCINOMA EXCISION     . BOTOX INJECTION  03/22/2017   In the bladder for nightly incontinence.  . C5 and T1 bone chip removed   1986  . [place]  . CHOLECYSTECTOMY  1993  . COLONOSCOPY  2011  . cortisone injection    . EYE SURGERY     cataract sx 2017 bilateral  . I&D left knee Left 1958   states 22 times  . KNEE ARTHROSCOPY Right 07/16/2014   Procedure: RIGHT ARTHROSCOPY KNEE WITH Medial and Lateral DEBRIDEMENT and chondroplasty;  Surgeon: Gearlean Alf, MD;  [facility];  Service: Orthopedics;  Laterality: Right;  . LUMBAR LAMINECTOMY/DECOMPRESSION MICRODISCECTOMY Right 02/17/2014   Procedure: RIGHT LUMBAR TWO-THREE LAMINECTOMY;  Surgeon: Charlie Pitter, MD;  Location: [place] NEURO [facility];  Service: Neurosurgery;  Laterality: Right;  right   . right shoulder surgery  2012   rotator cuff repair  . SHOULDER ARTHROSCOPY W/ ROTATOR CUFF REPAIR Left NOV 2010  . TONSILLECTOMY  1942  . TOTAL KNEE ARTHROPLASTY Right 05/27/2015   Procedure: RIGHT TOTAL KNEE ARTHROPLASTY;  Surgeon: Gaynelle Arabian, MD;  [facility];  Service: Orthopedics;  Laterality: Right;  . TOTAL KNEE ARTHROPLASTY Left 05/30/2016  Procedure: LEFT TOTAL KNEE ARTHROPLASTY;  Surgeon: Gaynelle Arabian, MD;  [facility];  Service: Orthopedics;  Laterality: Left;  . TRANSURETHRAL RESECTION OF PROSTATE  2006  . VASECTOMY  1983  . WISDOM TOOTH EXTRACTION      There were no vitals filed for this visit.      Subjective Assessment - 07/11/17 1133    Subjective feeling better today; wants to work on reaching/walking without cane   Pertinent History DM 2 (controlled), HTN (controlled), neuropathy   Patient Stated Goals improve balance   Currently in Pain? No/denies   Pain Score 0-No pain                         [facility] Adult PT Treatment/Exercise - 07/11/17 0001      Neuro Re-ed    Neuro Re-ed Details  red mat: 6 cone knock overs/sit ups with side stepping with each foot, 6 cone taps with side stepping each  foot; reaching high/low and placing cones in bag simulating home environment x 10;      Knee/Hip Exercises: Aerobic   Nustep L5 x 10 minutes     Knee/Hip Exercises: Seated   Other Seated Knee/Hip Exercises BATCA row - 20# x 15 reps   Sit to Sand 10 reps  1 UE support, VC to sit edge of chair                  PT Short Term Goals - 06/27/17 1503      PT SHORT TERM GOAL #1   Title patient to be independent with HEP (all STGs to be met by 07/19/17)   Status On-going     PT SHORT TERM GOAL #2   Title Patient to improve Berg to 32/56 demonstrating improved balance   Status On-going           PT Long Term Goals - 06/27/17 1505      PT LONG TERM GOAL #1   Title patient to be independent with advanced HEP (all goals to be met by 08/16/17)   Status On-going     PT LONG TERM GOAL #2   Title Patient to improve Berg to 44/56 demonstrating reduced fall risk    Status On-going     PT LONG TERM GOAL #3   Title Patient to improve gait velocity to >/= 2.62 ft/sec with no instability or LOB    Status On-going     PT LONG TERM GOAL #4   Title Patient to demonstrate ability to ambulate on various indoor/outdoor surfaces without LOB for improved functional mobility    Status On-going     PT LONG TERM GOAL #5   Title Patient to report no falls for greater than 2 weeks    Status On-going               Plan - 07/11/17 1134    Clinical Impression Statement Patient feeling better today - reporting more "normal" HR throughout session with no shortness of breath, as compared to last session. Patient and PT discussing activities that are difficult in the home with trash collection and emptying cat litter of most difficult tasks. PT able to simulate task within session with patient doing well with VC to slow movements and be purposeful during activity. Patient to continue to progress balance and general mobility at upcoming visits.    PT Treatment/Interventions ADLs/Self Care Home  Management;Cryotherapy;Electrical Stimulation;Moist Heat;Ultrasound;Neuromuscular re-education;Balance training;Therapeutic exercise;Therapeutic activities;Functional mobility training;Stair training;Gait training;Patient/family  education;Vasopneumatic Device;Taping   Consulted and Agree with Plan of Care Patient      Patient will benefit from skilled therapeutic intervention in order to improve the following deficits and impairments:  Abnormal gait, Decreased balance, Decreased mobility, Difficulty walking  Visit Diagnosis: Repeated falls  Unsteadiness on feet  Other abnormalities of gait and mobility     Problem List Patient Active Problem List   Diagnosis Date Noted  . Peripheral neuropathy 10/20/2016  . Bladder outlet obstruction 05/02/2016  . OA (osteoarthritis) of knee 05/27/2015  . Diabetes mellitus type II, controlled ([place]) 11/26/2014  . Gastroesophageal reflux disease without esophagitis 11/26/2014  . Arthritis of both knees 11/26/2014  . Absence of bladder continence 11/26/2014  . Hyperlipidemia LDL goal <100 11/26/2014  . Acute medial meniscal tear 07/16/2014  . Lumbosacral spondylosis without myelopathy 02/17/2014  . Spondylosis, lumbosacral 02/17/2014     Lanney Gins, PT, DPT 07/11/17 11:41 AM   [facility] [address]  [place], [place], [address] Phone: [phone]   Fax:  [phone]  Name: Matthew Nash MRN: [id number] Date of Birth: February 04, 1936

## 2017-07-12 DIAGNOSIS — R32 Unspecified urinary incontinence: Secondary | ICD-10-CM | POA: Diagnosis not present

## 2017-07-13 ENCOUNTER — Ambulatory Visit: Payer: Medicare Other | Attending: Family Medicine | Admitting: Physical Therapy

## 2017-07-13 DIAGNOSIS — R296 Repeated falls: Secondary | ICD-10-CM

## 2017-07-13 DIAGNOSIS — R2689 Other abnormalities of gait and mobility: Secondary | ICD-10-CM | POA: Diagnosis not present

## 2017-07-13 DIAGNOSIS — R2681 Unsteadiness on feet: Secondary | ICD-10-CM | POA: Diagnosis not present

## 2017-07-13 NOTE — Therapy (Signed)
[facility] [address]  [place], [place], [address] Phone: [phone]   Fax:  [phone]  Physical Therapy Treatment  Patient Details  Name: Matthew Nash MRN: [id number] Date of Birth: 1936/02/12 Referring Provider: Dr. Lamar Blinks  Encounter Date: 07/13/2017      PT End of Session - 07/13/17 1355    Visit Number 6   Number of Visits 16   Date for PT Re-Evaluation 08/16/17   Authorization Type Medicare/Tricare   PT Start Time 1353   PT Stop Time 1435   PT Time Calculation (min) 42 min   Activity Tolerance Patient tolerated treatment well   Behavior During Therapy [facility] for tasks assessed/performed      Past Medical History:  Diagnosis Date  . Arthritis    both knees  . Back pain at L4-L5 level   . BPH (benign prostatic hyperplasia)    hx s/p turp  . Chronic back pain    spondylosis  . Chronic kidney disease    kidney stones 1979  . DDD (degenerative disc disease), lumbar   . Diabetes mellitus    takes Metformin and Victoza daily-under control  . Diarrhea    x 1 today  . Diverticulitis   . GERD (gastroesophageal reflux disease)    takes Omeprazole daily-under control  . H/O hiatal hernia   . H/O measles   . Hepatitis    B-with Mono and jaundice 1960  . History of chicken pox   . History of colon polyps   . History of kidney stones   . Hyperlipidemia    takes Simvastatin and Tricor daily-under control  . Hypertension    takes Lisinopril daily-under control  . Joint pain    both knees  . Joint swelling    rt knee  . Melanoma ([place])    right hand;basal cell carcinoma  . Neuropathy   . Peripheral vascular disease ([place])    diabetic neuropathy  . Pneumonia    hx of  . Prostate infection    currently taking macrobid  . Tinnitus   . Urinary incontinence   . UTI (lower urinary tract infection)     Past Surgical History:  Procedure Laterality Date  . BASAL CELL CARCINOMA EXCISION     . BOTOX INJECTION  03/22/2017   In the bladder for nightly incontinence.  . C5 and T1 bone chip removed   1986  . [place]  . CHOLECYSTECTOMY  1993  . COLONOSCOPY  2011  . cortisone injection    . EYE SURGERY     cataract sx 2017 bilateral  . I&D left knee Left 1958   states 22 times  . KNEE ARTHROSCOPY Right 07/16/2014   Procedure: RIGHT ARTHROSCOPY KNEE WITH Medial and Lateral DEBRIDEMENT and chondroplasty;  Surgeon: Gearlean Alf, MD;  [facility];  Service: Orthopedics;  Laterality: Right;  . LUMBAR LAMINECTOMY/DECOMPRESSION MICRODISCECTOMY Right 02/17/2014   Procedure: RIGHT LUMBAR TWO-THREE LAMINECTOMY;  Surgeon: Charlie Pitter, MD;  Location: [place] [facility];  Service: Neurosurgery;  Laterality: Right;  right   . right shoulder surgery  2012   rotator cuff repair  . SHOULDER ARTHROSCOPY W/ ROTATOR CUFF REPAIR Left NOV 2010  . TONSILLECTOMY  1942  . TOTAL KNEE ARTHROPLASTY Right 05/27/2015   Procedure: RIGHT TOTAL KNEE ARTHROPLASTY;  Surgeon: Gaynelle Arabian, MD;  [facility];  Service: Orthopedics;  Laterality: Right;  . TOTAL KNEE ARTHROPLASTY Left 05/30/2016  Procedure: LEFT TOTAL KNEE ARTHROPLASTY;  Surgeon: Gaynelle Arabian, MD;  [facility];  Service: Orthopedics;  Laterality: Left;  . TRANSURETHRAL RESECTION OF PROSTATE  2006  . VASECTOMY  1983  . WISDOM TOOTH EXTRACTION      There were no vitals filed for this visit.      Subjective Assessment - 07/13/17 1354    Subjective feeling well today   Pertinent History DM 2 (controlled), HTN (controlled), neuropathy   Patient Stated Goals improve balance   Currently in Pain? No/denies   Pain Score 0-No pain                         [facility] Adult PT Treatment/Exercise - 07/13/17 1356      Neuro Re-ed    Neuro Re-ed Details  alternating ball kicks; side stepping over foam rollers 2 x 5 each way;      Exercises   Exercises Knee/Hip;Shoulder     Knee/Hip Exercises: Aerobic    Nustep L5 x 10 minutes     Knee/Hip Exercises: Standing   Forward Step Up Both;1 set;15 reps;Hand Hold: 0;Step Height: 6"   Forward Step Up Limitations fwd step up and down to simulate curbs     Shoulder Exercises: Seated   Row Strengthening;Both;20 reps;Theraband   Theraband Level (Shoulder Row) Level 3 (Green)   Other Seated Exercises bicep curls 4# x 20 reps                  PT Short Term Goals - 06/27/17 1503      PT SHORT TERM GOAL #1   Title patient to be independent with HEP (all STGs to be met by 07/19/17)   Status On-going     PT SHORT TERM GOAL #2   Title Patient to improve Berg to 32/56 demonstrating improved balance   Status On-going           PT Long Term Goals - 06/27/17 1505      PT LONG TERM GOAL #1   Title patient to be independent with advanced HEP (all goals to be met by 08/16/17)   Status On-going     PT LONG TERM GOAL #2   Title Patient to improve Berg to 44/56 demonstrating reduced fall risk    Status On-going     PT LONG TERM GOAL #3   Title Patient to improve gait velocity to >/= 2.62 ft/sec with no instability or LOB    Status On-going     PT LONG TERM GOAL #4   Title Patient to demonstrate ability to ambulate on various indoor/outdoor surfaces without LOB for improved functional mobility    Status On-going     PT LONG TERM GOAL #5   Title Patient to report no falls for greater than 2 weeks    Status On-going               Plan - 07/13/17 1355    Clinical Impression Statement Patient doing well today with all balance and strengthening activitesi of UE - as this is one of patient primary complaints. Very poor safety awareness throughout session with patient continuously rushing through activities only further increasing his fall risk as well as tendency to reach for floor for items requiring Min A to maintain balance. Patient today with tendency to backwards step/retripulse during standing activities, of which he reports is  primary mechanism of falls. Will continue to progress towards goals.    PT Treatment/Interventions  ADLs/Self Care Home Management;Cryotherapy;Electrical Stimulation;Moist Heat;Ultrasound;Neuromuscular re-education;Balance training;Therapeutic exercise;Therapeutic activities;Functional mobility training;Stair training;Gait training;Patient/family education;Vasopneumatic Device;Taping   Consulted and Agree with Plan of Care Patient      Patient will benefit from skilled therapeutic intervention in order to improve the following deficits and impairments:  Abnormal gait, Decreased balance, Decreased mobility, Difficulty walking  Visit Diagnosis: Repeated falls  Unsteadiness on feet  Other abnormalities of gait and mobility     Problem List Patient Active Problem List   Diagnosis Date Noted  . Peripheral neuropathy 10/20/2016  . Bladder outlet obstruction 05/02/2016  . OA (osteoarthritis) of knee 05/27/2015  . Diabetes mellitus type II, controlled ([place]) 11/26/2014  . Gastroesophageal reflux disease without esophagitis 11/26/2014  . Arthritis of both knees 11/26/2014  . Absence of bladder continence 11/26/2014  . Hyperlipidemia LDL goal <100 11/26/2014  . Acute medial meniscal tear 07/16/2014  . Lumbosacral spondylosis without myelopathy 02/17/2014  . Spondylosis, lumbosacral 02/17/2014     Lanney Gins, PT, DPT 07/13/17 2:39 PM   [facility] [address]  [place], [place], [address] Phone: [phone]   Fax:  [phone]  Name: Matthew Nash MRN: [id number] Date of Birth: 05-17-1936

## 2017-07-17 ENCOUNTER — Ambulatory Visit: Payer: Medicare Other | Admitting: Physical Therapy

## 2017-07-17 DIAGNOSIS — R2681 Unsteadiness on feet: Secondary | ICD-10-CM

## 2017-07-17 DIAGNOSIS — R2689 Other abnormalities of gait and mobility: Secondary | ICD-10-CM | POA: Diagnosis not present

## 2017-07-17 DIAGNOSIS — R296 Repeated falls: Secondary | ICD-10-CM | POA: Diagnosis not present

## 2017-07-17 NOTE — Therapy (Signed)
Matthew Nash [address]  [place], [place], [address] Phone: [phone]   Fax:  [phone]  Physical Therapy Treatment  Patient Details  Name: Matthew Nash MRN: [id number] Date of Birth: 08-01-1936 Referring Provider: Dr. Lamar Blinks  Encounter Date: 07/17/2017      PT End of Session - 07/17/17 1402    Visit Number 7   Number of Visits 16   Date for PT Re-Evaluation 08/16/17   Authorization Type Medicare/Tricare   PT Start Time 1359   PT Stop Time 1441   PT Time Calculation (min) 42 min   Activity Tolerance Patient tolerated treatment well   Behavior During Therapy [facility] for tasks assessed/performed      Past Medical History:  Diagnosis Date  . Arthritis    both knees  . Back pain at L4-L5 level   . BPH (benign prostatic hyperplasia)    hx s/p turp  . Chronic back pain    spondylosis  . Chronic kidney disease    kidney stones 1979  . DDD (degenerative disc disease), lumbar   . Diabetes mellitus    takes Metformin and Victoza daily-under control  . Diarrhea    x 1 today  . Diverticulitis   . GERD (gastroesophageal reflux disease)    takes Omeprazole daily-under control  . H/O hiatal hernia   . H/O measles   . Hepatitis    B-with Mono and jaundice 1960  . History of chicken pox   . History of colon polyps   . History of kidney stones   . Hyperlipidemia    takes Simvastatin and Tricor daily-under control  . Hypertension    takes Lisinopril daily-under control  . Joint pain    both knees  . Joint swelling    rt knee  . Melanoma ([place])    right hand;basal cell carcinoma  . Neuropathy   . Peripheral vascular disease ([place])    diabetic neuropathy  . Pneumonia    hx of  . Prostate infection    currently taking macrobid  . Tinnitus   . Urinary incontinence   . UTI (lower urinary tract infection)     Past Surgical History:  Procedure Laterality Date  . BASAL CELL CARCINOMA EXCISION     . BOTOX INJECTION  03/22/2017   In the bladder for nightly incontinence.  . C5 and T1 bone chip removed   1986  . [place]  . CHOLECYSTECTOMY  1993  . COLONOSCOPY  2011  . cortisone injection    . EYE SURGERY     cataract sx 2017 bilateral  . I&D left knee Left 1958   states 22 times  . KNEE ARTHROSCOPY Right 07/16/2014   Procedure: RIGHT ARTHROSCOPY KNEE WITH Medial and Lateral DEBRIDEMENT and chondroplasty;  Surgeon: Gearlean Alf, MD;  Location: [facility];  Service: Orthopedics;  Laterality: Right;  . LUMBAR LAMINECTOMY/DECOMPRESSION MICRODISCECTOMY Right 02/17/2014   Procedure: RIGHT LUMBAR TWO-THREE LAMINECTOMY;  Surgeon: Charlie Pitter, MD;  Location: [place] [facility];  Service: Neurosurgery;  Laterality: Right;  right   . right shoulder surgery  2012   rotator cuff repair  . SHOULDER ARTHROSCOPY W/ ROTATOR CUFF REPAIR Left NOV 2010  . TONSILLECTOMY  1942  . TOTAL KNEE ARTHROPLASTY Right 05/27/2015   Procedure: RIGHT TOTAL KNEE ARTHROPLASTY;  Surgeon: Gaynelle Arabian, MD;  Location: [facility];  Service: Orthopedics;  Laterality: Right;  . TOTAL KNEE ARTHROPLASTY Left 05/30/2016  Procedure: LEFT TOTAL KNEE ARTHROPLASTY;  Surgeon: Gaynelle Arabian, MD;  Location: [facility];  Service: Orthopedics;  Laterality: Left;  . TRANSURETHRAL RESECTION OF PROSTATE  2006  . VASECTOMY  1983  . WISDOM TOOTH EXTRACTION      There were no vitals filed for this visit.      Subjective Assessment - 07/17/17 1402    Subjective no new complaints   Pertinent History DM 2 (controlled), HTN (controlled), neuropathy   Patient Stated Goals improve balance   Currently in Pain? No/denies   Pain Score 0-No pain                         [facility] Adult PT Treatment/Exercise - 07/17/17 1403      Neuro Re-ed    Neuro Re-ed Details  stepping up and over aerobic step x 12 reps; standing with manual perturbations by PT - heavy use of cane; sit to stand on Air[employer] - tendency to retropulse due to  heavy knee extension with weight shift in heels      Knee/Hip Exercises: Aerobic   Nustep L5 x 10 minutes - BLE only     Knee/Hip Exercises: Machines for Strengthening   Cybex Knee Extension 35# B LE 2 x 10 reps     Knee/Hip Exercises: Seated   Other Seated Knee/Hip Exercises tband row - green x 15; modified chest press - green tband x 15 each UE                  PT Short Term Goals - 06/27/17 1503      PT SHORT TERM GOAL #1   Title patient to be independent with HEP (all STGs to be met by 07/19/17)   Status On-going     PT SHORT TERM GOAL #2   Title Patient to improve Berg to 32/56 demonstrating improved balance   Status On-going           PT Long Term Goals - 06/27/17 1505      PT LONG TERM GOAL #1   Title patient to be independent with advanced HEP (all goals to be met by 08/16/17)   Status On-going     PT LONG TERM GOAL #2   Title Patient to improve Berg to 44/56 demonstrating reduced fall risk    Status On-going     PT LONG TERM GOAL #3   Title Patient to improve gait velocity to >/= 2.62 ft/sec with no instability or LOB    Status On-going     PT LONG TERM GOAL #4   Title Patient to demonstrate ability to ambulate on various indoor/outdoor surfaces without LOB for improved functional mobility    Status On-going     PT LONG TERM GOAL #5   Title Patient to report no falls for greater than 2 weeks    Status On-going               Plan - 07/17/17 1402    Clinical Impression Statement Mr. Munter doing well today - reports he has a PCP appt next week where he would like to discuss shortness of breath. Patient doing well with all strengthening and balance tasks today with hevay VC to reduce weight shift and heavy knee extension upon rising for hopeful reduced retropulsion increasing his fall risk with fair/good carryover. Will continue to progress towards goals.    PT Treatment/Interventions ADLs/Self Care Home Management;Cryotherapy;Electrical  Stimulation;Moist Heat;Ultrasound;Neuromuscular re-education;Balance training;Therapeutic exercise;Therapeutic activities;Functional mobility training;Stair  training;Gait training;Patient/family education;Vasopneumatic Device;Taping   Consulted and Agree with Plan of Care Patient      Patient will benefit from skilled therapeutic intervention in order to improve the following deficits and impairments:  Abnormal gait, Decreased balance, Decreased mobility, Difficulty walking  Visit Diagnosis: Repeated falls  Unsteadiness on feet  Other abnormalities of gait and mobility     Problem List Patient Active Problem List   Diagnosis Date Noted  . Peripheral neuropathy 10/20/2016  . Bladder outlet obstruction 05/02/2016  . OA (osteoarthritis) of knee 05/27/2015  . Diabetes mellitus type II, controlled ([place]) 11/26/2014  . Gastroesophageal reflux disease without esophagitis 11/26/2014  . Arthritis of both knees 11/26/2014  . Absence of bladder continence 11/26/2014  . Hyperlipidemia LDL goal <100 11/26/2014  . Acute medial meniscal tear 07/16/2014  . Lumbosacral spondylosis without myelopathy 02/17/2014  . Spondylosis, lumbosacral 02/17/2014    Lanney Gins, PT, DPT 07/17/17 4:24 PM   [facility] [address]  [place], [place], [address] Phone: [phone]   Fax:  [phone]  Name: SAVERIO KADER MRN: [id number] Date of Birth: [birth year]

## 2017-07-23 NOTE — Progress Notes (Signed)
[facility] [place], [place], [place] [address] [phone]  Date:  07/24/2017   Name:  Matthew Nash   DOB:  05-May-1936   MRN:  [id number]  PCP:  Darreld Mclean, MD    Chief Complaint: Shortness of Breath (pt. reports worsening shortness of breath for the past 1-2 months now; also he has some concern w/ changes in vocal cords)   History of Present Illness:  Matthew Nash is a 81 y.o. very pleasant male patient who presents with the following:  History of DM, degenerative joint disease, GERD Here today with concern of voice changes over the last 3-4 weeks He has noted his voice has been squeaky or may go in and out He will have some mucus in his throat at times but otherwise has not had any URI sx He has not noted any GERD Never had any voice change in the past  No cough or fever since he had his UTI  He did used to smoke, but quit in the 1970s.  He did not smoke for long He is not a heavy alcohol drinker at all  He also notes that his breath does not seem to be quite as easy for the last 2-3 months - he does not feel SOB, but feels like it can be harder to take a deep breath in No difficulty swallowing, foods are not getting stuck  O2 sat in June was 93%  He did have a CXR in the ER last month which was normal  Pulse Readings from Last 3 Encounters:  07/24/17 66  07/03/17 92  06/13/17 85   BP Readings from Last 3 Encounters:  07/24/17 112/64  07/03/17 120/65  06/13/17 124/71    Wt Readings from Last 3 Encounters:  07/24/17 231 lb 3.2 oz (104.9 kg)  07/03/17 227 lb (103 kg)  06/13/17 229 lb (103.9 kg)    Lab Results  Component Value Date   HGBA1C 6.6 (H) 03/01/2017   Needs an A1c and foot exam today also  He is using melatonin for sleep- he is able to get to sleep ok, but will wake up around 1 or 2 am.  He is then able to get back to sleep after an hour or so; he may get up and play on  his computer until he feels sleepy  He feels like melatonin does not make much of a difference and would like to know if he can stop using it- yes, he can stop.   He is using metformin 1000 BID for his DM and has been under fine control as above Her GFR is over 50, ok to continue metfomrin Patient Active Problem List   Diagnosis Date Noted  . Peripheral neuropathy 10/20/2016  . Bladder outlet obstruction 05/02/2016  . OA (osteoarthritis) of knee 05/27/2015  . Diabetes mellitus type II, controlled ([place]) 11/26/2014  . Gastroesophageal reflux disease without esophagitis 11/26/2014  . Arthritis of both knees 11/26/2014  . Absence of bladder continence 11/26/2014  . Hyperlipidemia LDL goal <100 11/26/2014  . Acute medial meniscal tear 07/16/2014  . Lumbosacral spondylosis without myelopathy 02/17/2014  . Spondylosis, lumbosacral 02/17/2014    Past Medical History:  Diagnosis Date  . Arthritis    both knees  . Back pain at L4-L5 level   . BPH (benign prostatic hyperplasia)    hx s/p turp  . Chronic back pain    spondylosis  .  Chronic kidney disease    kidney stones 1979  . DDD (degenerative disc disease), lumbar   . Diabetes mellitus    takes Metformin and Victoza daily-under control  . Diarrhea    x 1 today  . Diverticulitis   . GERD (gastroesophageal reflux disease)    takes Omeprazole daily-under control  . H/O hiatal hernia   . H/O measles   . Hepatitis    B-with Mono and jaundice 1960  . History of chicken pox   . History of colon polyps   . History of kidney stones   . Hyperlipidemia    takes Simvastatin and Tricor daily-under control  . Hypertension    takes Lisinopril daily-under control  . Joint pain    both knees  . Joint swelling    rt knee  . Melanoma ([place])    right hand;basal cell carcinoma  . Neuropathy   . Peripheral vascular disease ([place])    diabetic neuropathy  . Pneumonia    hx of  . Prostate infection    currently taking macrobid  . Tinnitus    . Urinary incontinence   . UTI (lower urinary tract infection)     Past Surgical History:  Procedure Laterality Date  . BASAL CELL CARCINOMA EXCISION    . BOTOX INJECTION  03/22/2017   In the bladder for nightly incontinence.  . C5 and T1 bone chip removed   1986  . [place]  . CHOLECYSTECTOMY  1993  . COLONOSCOPY  2011  . cortisone injection    . EYE SURGERY     cataract sx 2017 bilateral  . I&D left knee Left 1958   states 22 times  . KNEE ARTHROSCOPY Right 07/16/2014   Procedure: RIGHT ARTHROSCOPY KNEE WITH Medial and Lateral DEBRIDEMENT and chondroplasty;  Surgeon: Gearlean Alf, MD;  Location: [facility];  Service: Orthopedics;  Laterality: Right;  . LUMBAR LAMINECTOMY/DECOMPRESSION MICRODISCECTOMY Right 02/17/2014   Procedure: RIGHT LUMBAR TWO-THREE LAMINECTOMY;  Surgeon: Charlie Pitter, MD;  Location: [place] [facility];  Service: Neurosurgery;  Laterality: Right;  right   . right shoulder surgery  2012   rotator cuff repair  . SHOULDER ARTHROSCOPY W/ ROTATOR CUFF REPAIR Left NOV 2010  . TONSILLECTOMY  1942  . TOTAL KNEE ARTHROPLASTY Right 05/27/2015   Procedure: RIGHT TOTAL KNEE ARTHROPLASTY;  Surgeon: Gaynelle Arabian, MD;  [facility];  Service: Orthopedics;  Laterality: Right;  . TOTAL KNEE ARTHROPLASTY Left 05/30/2016   Procedure: LEFT TOTAL KNEE ARTHROPLASTY;  Surgeon: Gaynelle Arabian, MD;  Location: [facility];  Service: Orthopedics;  Laterality: Left;  . TRANSURETHRAL RESECTION OF PROSTATE  2006  . VASECTOMY  1983  . WISDOM TOOTH EXTRACTION      Social History  Substance Use Topics  . Smoking status: Former Smoker    Types: Pipe    Quit date: 07/14/1978  . Smokeless tobacco: Never Used     Comment: quit smoking 1979  . Alcohol use Yes     Comment: OCCASIONAL    Family History  Problem Relation Age of Onset  . Heart disease Mother 46       Deceased  . Bladder Cancer Mother   . Prostate cancer Father        Deceased-in 27s  . Healthy Brother   . Skin  cancer Brother        "basal cell cancer in the bloodstream"  . Asthma Sister        Deceased  . Healthy  Son        #1  . Healthy Daughter        #2  . Obesity Son        #2    Allergies  Allergen Reactions  . Albamycin [Novobiocin] Other (See Comments)    Made me look like a strawberry   . Penicillins Rash and Other (See Comments)    Has patient had a PCN reaction causing immediate rash, facial/tongue/throat swelling, SOB or lightheadedness with hypotension:  no Has patient had a PCN reaction causing severe rash involving mucus membranes or skin necrosis: no Has patient had a PCN reaction that required hospitalization no Has patient had a PCN reaction occurring within the last 10 years: no - childhood reaction If all of the above answers are "NO", then may proceed with Cephalosporin use.     Medication list has been reviewed and updated.  Current Outpatient Prescriptions on File Prior to Visit  Medication Sig Dispense Refill  . CALCIUM-MAGNESIUM-VITAMIN D PO Take by mouth.    . celecoxib (CELEBREX) 200 MG capsule TAKE 1 CAPSULE TWICE A DAY 180 capsule 2  . fenofibrate (TRICOR) 145 MG tablet Take 1 tablet (145 mg total) by mouth every evening. 90 tablet 3  . FIBER PO Take 3 capsules by mouth 2 (two) times daily.    [name] Kitchen glucose blood (FREESTYLE LITE) test strip Use as instructed to test Blood Glucose twice daily Dx: E11.9 200 each 12  . Insulin Pen Needle (CAREFINE PEN NEEDLES) 31G X 8 MM MISC Use once daily with Victoza injections. 100 each 3  . Lancets (FREESTYLE) lancets USE 1 LANCET TWICE A DAY AS INSTRUCTED 200 each 3  . lisinopril (PRINIVIL,ZESTRIL) 5 MG tablet Take 1 tablet (5 mg total) by mouth every evening. 90 tablet 3  . Multiple Vitamin (MULTIVITAMIN) tablet Take 1 tablet by mouth daily.    [name] Kitchen omeprazole (PRILOSEC) 20 MG capsule TAKE 1 CAPSULE DAILY 90 capsule 3  . Probiotic Product (PROBIOTIC ADVANCED PO) Take by mouth.    . Probiotic Product (PROBIOTIC PO) Take 1  capsule by mouth daily.    . simvastatin (ZOCOR) 10 MG tablet Take 1 tablet (10 mg total) by mouth at bedtime. 90 tablet 1   No current facility-administered medications on file prior to visit.     Review of Systems:  As per HPI- otherwise negative.   Physical Examination: Vitals:   07/24/17 1305  BP: 112/64  Pulse: 66  Temp: 98.3 F (36.8 C)  SpO2: 92%   Vitals:   07/24/17 1305  Weight: 231 lb 3.2 oz (104.9 kg)  Height: 5\' 11"  (1.803 m)   Body mass index is 32.25 kg/m. Ideal Body Weight: Weight in (lb) to have BMI = 25: 178.9  GEN: WDWN, NAD, Non-toxic, A & O x 3, overweight, looks well HEENT: Atraumatic, Normocephalic. Neck supple. No masses, No LAD.  Bilateral TM wnl, oropharynx normal.  PEERL,EOMI.   Ears and Nose: No external deformity. CV: RRR, No M/G/R. No JVD. No thrill. No extra heart sounds. PULM: CTA B, no wheezes, crackles, rhonchi. No retractions. No resp. distress. No accessory muscle use. ABD: S, NT, ND EXTR: No c/c/e NEURO Normal gait for pt- wears bilateral AFO braces PSYCH: Normally interactive. Conversant. Not depressed or anxious appearing.  Calm demeanor.  His voice is a bit different than is normal for him- will go in and out and sounds squeaky- not so much hoarse however   Assessment and Plan: Controlled type 2 diabetes  mellitus without complication, without long-term current use of insulin ([place]) - Plan: metFORMIN (GLUCOPHAGE) 1000 MG tablet, Hemoglobin A1c  Gait instability  Change in voice - Plan: Ambulatory referral to ENT  Dyslipidemia - Plan: Lipid panel  Here today with a couple of concerns Refilled his metformin today He plans to come in for an A1c and fasting lipids next month- I ordered these as future labs for him today Discussed his voice concerns- he would like to see an ENT doctor for a possible scope to make sure all is well.  His wife is very concerned and he quips "a nagging wife will save your life." will refer him  today  Will follow-up pending his future labs   Signed Lamar Blinks, MD

## 2017-07-24 ENCOUNTER — Encounter: Payer: Self-pay | Admitting: Family Medicine

## 2017-07-24 ENCOUNTER — Ambulatory Visit (INDEPENDENT_AMBULATORY_CARE_PROVIDER_SITE_OTHER): Payer: Medicare Other | Admitting: Family Medicine

## 2017-07-24 ENCOUNTER — Ambulatory Visit: Payer: Medicare Other | Admitting: Physical Therapy

## 2017-07-24 VITALS — BP 112/64 | HR 66 | Temp 98.3°F | Ht 71.0 in | Wt 231.2 lb

## 2017-07-24 DIAGNOSIS — R499 Unspecified voice and resonance disorder: Secondary | ICD-10-CM | POA: Diagnosis not present

## 2017-07-24 DIAGNOSIS — E119 Type 2 diabetes mellitus without complications: Secondary | ICD-10-CM

## 2017-07-24 DIAGNOSIS — R2681 Unsteadiness on feet: Secondary | ICD-10-CM

## 2017-07-24 DIAGNOSIS — R296 Repeated falls: Secondary | ICD-10-CM | POA: Diagnosis not present

## 2017-07-24 DIAGNOSIS — E785 Hyperlipidemia, unspecified: Secondary | ICD-10-CM

## 2017-07-24 DIAGNOSIS — R2689 Other abnormalities of gait and mobility: Secondary | ICD-10-CM | POA: Diagnosis not present

## 2017-07-24 MED ORDER — METFORMIN HCL 1000 MG PO TABS: 1000.0000 mg | ORAL_TABLET | Freq: Two times a day (BID) | ORAL | 3 refills | Status: DC

## 2017-07-24 NOTE — Patient Instructions (Signed)
It was great to see you today as always!  We will set you up to see an ENT doctor- let me know if you do not hear about an appointment soon I also refilled your metformin today

## 2017-07-24 NOTE — Therapy (Addendum)
[facility] [address]  [place], [place], [address] Phone: [phone]   Fax:  [phone]  Physical Therapy Treatment  Patient Details  Name: Matthew Nash MRN: [id number] Date of Birth: December 13, 1935 Referring Provider: Dr. Lamar Blinks  Encounter Date: 07/24/2017      PT End of Session - 07/24/17 1352    Visit Number 8   Number of Visits 16   Date for PT Re-Evaluation 08/16/17   Authorization Type Medicare/Tricare   PT Start Time 1350   PT Stop Time 1435   PT Time Calculation (min) 45 min   Activity Tolerance Patient tolerated treatment well   Behavior During Therapy [facility] for tasks assessed/performed      Past Medical History:  Diagnosis Date  . Arthritis    both knees  . Back pain at L4-L5 level   . BPH (benign prostatic hyperplasia)    hx s/p turp  . Chronic back pain    spondylosis  . Chronic kidney disease    kidney stones 1979  . DDD (degenerative disc disease), lumbar   . Diabetes mellitus    takes Metformin and Victoza daily-under control  . Diarrhea    x 1 today  . Diverticulitis   . GERD (gastroesophageal reflux disease)    takes Omeprazole daily-under control  . H/O hiatal hernia   . H/O measles   . Hepatitis    B-with Mono and jaundice 1960  . History of chicken pox   . History of colon polyps   . History of kidney stones   . Hyperlipidemia    takes Simvastatin and Tricor daily-under control  . Hypertension    takes Lisinopril daily-under control  . Joint pain    both knees  . Joint swelling    rt knee  . Melanoma ([place])    right hand;basal cell carcinoma  . Neuropathy   . Peripheral vascular disease ([place])    diabetic neuropathy  . Pneumonia    hx of  . Prostate infection    currently taking macrobid  . Tinnitus   . Urinary incontinence   . UTI (lower urinary tract infection)     Past Surgical History:  Procedure Laterality Date  . BASAL CELL CARCINOMA EXCISION     . BOTOX INJECTION  03/22/2017   In the bladder for nightly incontinence.  . C5 and T1 bone chip removed   1986  . [place]  . CHOLECYSTECTOMY  1993  . COLONOSCOPY  2011  . cortisone injection    . EYE SURGERY     cataract sx 2017 bilateral  . I&D left knee Left 1958   states 22 times  . KNEE ARTHROSCOPY Right 07/16/2014   Procedure: RIGHT ARTHROSCOPY KNEE WITH Medial and Lateral DEBRIDEMENT and chondroplasty;  Surgeon: Gearlean Alf, MD;  Location: [facility];  Service: Orthopedics;  Laterality: Right;  . LUMBAR LAMINECTOMY/DECOMPRESSION MICRODISCECTOMY Right 02/17/2014   Procedure: RIGHT LUMBAR TWO-THREE LAMINECTOMY;  Surgeon: Charlie Pitter, MD;  Location: [place] [facility];  Service: Neurosurgery;  Laterality: Right;  right   . right shoulder surgery  2012   rotator cuff repair  . SHOULDER ARTHROSCOPY W/ ROTATOR CUFF REPAIR Left NOV 2010  . TONSILLECTOMY  1942  . TOTAL KNEE ARTHROPLASTY Right 05/27/2015   Procedure: RIGHT TOTAL KNEE ARTHROPLASTY;  Surgeon: Gaynelle Arabian, MD;  Location: [facility];  Service: Orthopedics;  Laterality: Right;  . TOTAL KNEE ARTHROPLASTY Left 05/30/2016  Procedure: LEFT TOTAL KNEE ARTHROPLASTY;  Surgeon: Gaynelle Arabian, MD;  [facility];  Service: Orthopedics;  Laterality: Left;  . TRANSURETHRAL RESECTION OF PROSTATE  2006  . VASECTOMY  1983  . WISDOM TOOTH EXTRACTION      There were no vitals filed for this visit.      Subjective Assessment - 07/24/17 1351    Subjective feeling well - did a lot of steps over recent trip   Pertinent History DM 2 (controlled), HTN (controlled), neuropathy   Patient Stated Goals improve balance   Currently in Pain? No/denies   Pain Score 0-No pain            [facility] PT Assessment - 07/24/17 0001      Observation/Other Assessments   Focus on Therapeutic Outcomes (FOTO)  Neuro: 60 (40% limited)     Ambulation/Gait   Gait velocity 2.67 ft/sec     Berg Balance Test   Sit to Stand Able to stand   independently using hands   Standing Unsupported Able to stand safely 2 minutes   Sitting with Back Unsupported but Feet Supported on Floor or Stool Able to sit safely and securely 2 minutes   Stand to Sit Controls descent by using hands   Transfers Able to transfer safely, definite need of hands   Standing Unsupported with Eyes Closed Able to stand 10 seconds with supervision   Standing Ubsupported with Feet Together Able to place feet together independently and stand for 1 minute with supervision   From Standing, Reach Forward with Outstretched Arm Can reach forward >5 cm safely (2")   From Standing Position, Pick up Object from [place] to pick up shoe, needs supervision   From Standing Position, Turn to Look Behind Over each Shoulder Turn sideways only but maintains balance   Turn 360 Degrees Able to turn 360 degrees safely but slowly   Standing Unsupported, Alternately Place Feet on Step/Stool Able to complete >2 steps/needs minimal assist   Standing Unsupported, One Foot in Front Able to take small step independently and hold 30 seconds   Standing on One Leg Tries to lift leg/unable to hold 3 seconds but remains standing independently   Total Score 36                     [facility] Adult PT Treatment/Exercise - 07/24/17 1353      Ambulation/Gait   Gait velocity 2.67 ft/sec     Neuro Re-ed    Neuro Re-ed Details  reaching for cones above eye level and from floor x 20; sit to stand with AirEx x 12     Knee/Hip Exercises: Aerobic   Nustep L5 x 10 minutes - BLE only     Knee/Hip Exercises: Machines for Strengthening   Cybex Knee Extension 35# B LE 2 x 10 reps                  PT Short Term Goals - 07/24/17 1629      PT SHORT TERM GOAL #1   Title patient to be independent with HEP (all STGs to be met by 07/19/17)   Status Achieved     PT SHORT TERM GOAL #2   Title Patient to improve Berg to 32/56 demonstrating improved balance   Status Achieved            PT Long Term Goals - 07/24/17 1438      PT LONG TERM GOAL #1   Title patient to be  independent with advanced HEP (all goals to be met by 08/16/17)   Status Achieved     PT LONG TERM GOAL #2   Title Patient to improve Berg to 44/56 demonstrating reduced fall risk    Status Not Met     PT LONG TERM GOAL #3   Title Patient to improve gait velocity to >/= 2.62 ft/sec with no instability or LOB    Status Achieved     PT LONG TERM GOAL #4   Title Patient to demonstrate ability to ambulate on various indoor/outdoor surfaces without LOB for improved functional mobility    Status Partially Met     PT LONG TERM GOAL #5   Title Patient to report no falls for greater than 2 weeks    Status Achieved               Plan - 12-Aug-2017 1352    Clinical Impression Statement Patient doing well today - no new complaints or reports of falls. Patient demonstrating much improved safety with reaching for obects both above eye level as well as at floor level with no LOB noted. Does require some VC to ensure good sit to stnad pattern with VC to reduced tendency to lock knees when rising. Patient and PT discussing 30 day hold today as both feel as patient has plateaued with treatment with understanding on need to continue to practive balance (OTAGO program) at home independently. Will gladly see patient in the future for any other needs.    PT Treatment/Interventions ADLs/Self Care Home Management;Cryotherapy;Electrical Stimulation;Moist Heat;Ultrasound;Neuromuscular re-education;Balance training;Therapeutic exercise;Therapeutic activities;Functional mobility training;Stair training;Gait training;Patient/family education;Vasopneumatic Device;Taping   Consulted and Agree with Plan of Care Patient      Patient will benefit from skilled therapeutic intervention in order to improve the following deficits and impairments:  Abnormal gait, Decreased balance, Decreased mobility, Difficulty walking  Visit  Diagnosis: Repeated falls  Unsteadiness on feet  Other abnormalities of gait and mobility       G-Codes - 08/12/2017 1633    Functional Assessment Tool Used (Outpatient Only) FOTO: 60 (40% limited + clinical judgement   Functional Limitation Mobility: Walking and moving around   Mobility: Walking and Moving Around Goal Status [phone]) At least 40 percent but less than 60 percent impaired, limited or restricted   Mobility: Walking and Moving Around Discharge Status [phone]) At least 40 percent but less than 60 percent impaired, limited or restricted      Problem List Patient Active Problem List   Diagnosis Date Noted  . Peripheral neuropathy 10/20/2016  . Bladder outlet obstruction 05/02/2016  . OA (osteoarthritis) of knee 05/27/2015  . Diabetes mellitus type II, controlled ([place]) 11/26/2014  . Gastroesophageal reflux disease without esophagitis 11/26/2014  . Arthritis of both knees 11/26/2014  . Absence of bladder continence 11/26/2014  . Hyperlipidemia LDL goal <100 11/26/2014  . Acute medial meniscal tear 07/16/2014  . Lumbosacral spondylosis without myelopathy 02/17/2014  . Spondylosis, lumbosacral 02/17/2014     Lanney Gins, PT, DPT 2017/08/12 4:36 PM   PHYSICAL THERAPY DISCHARGE SUMMARY  Visits from Start of Care: 8  Current functional level related to goals / functional outcomes: See above   Remaining deficits: See above   Education / Equipment: HEP, OTAGO  Plan: Patient agrees to discharge.  Patient goals were partially met. Patient is being discharged due to being pleased with the current functional level.  ?????    Lanney Gins, PT, DPT 07/26/17 3:27 PM   [facility] [address]  [place], [place], [address] Phone: [phone]   Fax:  [phone]  Name: MOOSA BUECHE MRN: [id number] Date of Birth: 16-Jan-1936

## 2017-07-26 ENCOUNTER — Ambulatory Visit: Payer: Medicare Other | Admitting: Physical Therapy

## 2017-07-27 DIAGNOSIS — Z85828 Personal history of other malignant neoplasm of skin: Secondary | ICD-10-CM | POA: Diagnosis not present

## 2017-07-27 DIAGNOSIS — L814 Other melanin hyperpigmentation: Secondary | ICD-10-CM | POA: Diagnosis not present

## 2017-07-27 DIAGNOSIS — L570 Actinic keratosis: Secondary | ICD-10-CM | POA: Diagnosis not present

## 2017-07-27 DIAGNOSIS — D485 Neoplasm of uncertain behavior of skin: Secondary | ICD-10-CM | POA: Diagnosis not present

## 2017-07-27 DIAGNOSIS — C44612 Basal cell carcinoma of skin of right upper limb, including shoulder: Secondary | ICD-10-CM | POA: Diagnosis not present

## 2017-07-27 DIAGNOSIS — D225 Melanocytic nevi of trunk: Secondary | ICD-10-CM | POA: Diagnosis not present

## 2017-07-27 DIAGNOSIS — Z08 Encounter for follow-up examination after completed treatment for malignant neoplasm: Secondary | ICD-10-CM | POA: Diagnosis not present

## 2017-07-27 DIAGNOSIS — C4441 Basal cell carcinoma of skin of scalp and neck: Secondary | ICD-10-CM | POA: Diagnosis not present

## 2017-07-28 DIAGNOSIS — R499 Unspecified voice and resonance disorder: Secondary | ICD-10-CM | POA: Diagnosis not present

## 2017-08-07 ENCOUNTER — Other Ambulatory Visit: Payer: Self-pay | Admitting: Family Medicine

## 2017-08-19 NOTE — Progress Notes (Signed)
[facility] [place], [place], [place] [address] [phone]  Date:  08/21/2017   Name:  Matthew Nash   DOB:  08/01/1936   MRN:  [id number]  PCP:  Darreld Mclean, MD    Chief Complaint: Diarrhea (Pt having frequent loose stools. )   History of Present Illness:  Matthew Nash is a 81 y.o. very pleasant male patient who presents with the following:  history of DM, GERD, hyperlipidemia I last saw him about a month ago to check on his DM He is on metformin 1000 BID and his DM has been well controlled  Lab Results  Component Value Date   HGBA1C 6.6 (H) 03/01/2017   Pt here today with concern about irregular and urgent stools.  Recently he was not able to make it in time and [place] the airport   He had been kind of dealing with issue to varying degrees since he had his gallbladder out in 1993, but as long as he is at home he is fine. However when he goes out this is a bigger issue- esp when he cannot get to a bathroom right away.    We stopped his victoza at the end of June as it was no longer covered and then increased his metfomrin dose from 500 BID to 1000 BID- this seemed to be when things got a bit worse as far as his bowels No blood or mucus in his stools No nausea or vomiting No fever  Wt Readings from Last 3 Encounters:  08/21/17 228 lb (103.4 kg)  07/24/17 231 lb 3.2 oz (104.9 kg)  07/03/17 227 lb (103 kg)    He is otherwise feeling ok today   Patient Active Problem List   Diagnosis Date Noted  . Peripheral neuropathy 10/20/2016  . Bladder outlet obstruction 05/02/2016  . OA (osteoarthritis) of knee 05/27/2015  . Diabetes mellitus type II, controlled ([place]) 11/26/2014  . Gastroesophageal reflux disease without esophagitis 11/26/2014  . Arthritis of both knees 11/26/2014  . Absence of bladder continence 11/26/2014  . Hyperlipidemia LDL goal <100 11/26/2014  . Acute medial  meniscal tear 07/16/2014  . Lumbosacral spondylosis without myelopathy 02/17/2014  . Spondylosis, lumbosacral 02/17/2014    Past Medical History:  Diagnosis Date  . Arthritis    both knees  . Back pain at L4-L5 level   . BPH (benign prostatic hyperplasia)    hx s/p turp  . Chronic back pain    spondylosis  . Chronic kidney disease    kidney stones 1979  . DDD (degenerative disc disease), lumbar   . Diabetes mellitus    takes Metformin and Victoza daily-under control  . Diarrhea    x 1 today  . Diverticulitis   . GERD (gastroesophageal reflux disease)    takes Omeprazole daily-under control  . H/O hiatal hernia   . H/O measles   . Hepatitis    B-with Mono and jaundice 1960  . History of chicken pox   . History of colon polyps   . History of kidney stones   . Hyperlipidemia    takes Simvastatin and Tricor daily-under control  . Hypertension    takes Lisinopril daily-under control  . Joint pain    both knees  . Joint swelling    rt knee  . Melanoma ([place])    right hand;basal cell carcinoma  . Neuropathy   . Peripheral vascular  disease ([place])    diabetic neuropathy  . Pneumonia    hx of  . Prostate infection    currently taking macrobid  . Tinnitus   . Urinary incontinence   . UTI (lower urinary tract infection)     Past Surgical History:  Procedure Laterality Date  . BASAL CELL CARCINOMA EXCISION    . BOTOX INJECTION  03/22/2017   In the bladder for nightly incontinence.  . C5 and T1 bone chip removed   1986  . [place]  . CHOLECYSTECTOMY  1993  . COLONOSCOPY  2011  . cortisone injection    . EYE SURGERY     cataract sx 2017 bilateral  . I&D left knee Left 1958   states 22 times  . KNEE ARTHROSCOPY Right 07/16/2014   Procedure: RIGHT ARTHROSCOPY KNEE WITH Medial and Lateral DEBRIDEMENT and chondroplasty;  Surgeon: Gearlean Alf, MD;  [facility]: [facility];  Service: Orthopedics;  Laterality: Right;  . LUMBAR LAMINECTOMY/DECOMPRESSION  MICRODISCECTOMY Right 02/17/2014   Procedure: RIGHT LUMBAR TWO-THREE LAMINECTOMY;  Surgeon: Charlie Pitter, MD;  Location: [facility];  Service: Neurosurgery;  Laterality: Right;  right   . right shoulder surgery  2012   rotator cuff repair  . SHOULDER ARTHROSCOPY W/ ROTATOR CUFF REPAIR Left NOV 2010  . TONSILLECTOMY  1942  . TOTAL KNEE ARTHROPLASTY Right 05/27/2015   Procedure: RIGHT TOTAL KNEE ARTHROPLASTY;  Surgeon: Gaynelle Arabian, MD;  [facility];  Service: Orthopedics;  Laterality: Right;  . TOTAL KNEE ARTHROPLASTY Left 05/30/2016   Procedure: LEFT TOTAL KNEE ARTHROPLASTY;  Surgeon: Gaynelle Arabian, MD;  [facility];  Service: Orthopedics;  Laterality: Left;  . TRANSURETHRAL RESECTION OF PROSTATE  2006  . VASECTOMY  1983  . WISDOM TOOTH EXTRACTION      Social History  Substance Use Topics  . Smoking status: Former Smoker    Types: Pipe    Quit date: 07/14/1978  . Smokeless tobacco: Never Used     Comment: quit smoking 1979  . Alcohol use Yes     Comment: OCCASIONAL    Family History  Problem Relation Age of Onset  . Heart disease Mother 2       Deceased  . Bladder Cancer Mother   . Prostate cancer Father        Deceased-in 103s  . Healthy Brother   . Skin cancer Brother        "basal cell cancer in the bloodstream"  . Asthma Sister        Deceased  . Healthy Son        #1  . Healthy Daughter        #2  . Obesity Son        #2    Allergies  Allergen Reactions  . Albamycin [Novobiocin] Other (See Comments)    Made me look like a strawberry   . Penicillins Rash and Other (See Comments)    Has patient had a PCN reaction causing immediate rash, facial/tongue/throat swelling, SOB or lightheadedness with hypotension:  no Has patient had a PCN reaction causing severe rash involving mucus membranes or skin necrosis: no Has patient had a PCN reaction that required hospitalization no Has patient had a PCN reaction occurring within the last 10 years: no - childhood  reaction If all of the above answers are "NO", then may proceed with Cephalosporin use.     Medication list has been reviewed and updated.  Current Outpatient Prescriptions on  File Prior to Visit  Medication Sig Dispense Refill  . CALCIUM-MAGNESIUM-VITAMIN D PO Take by mouth.    . celecoxib (CELEBREX) 200 MG capsule TAKE 1 CAPSULE TWICE A DAY 180 capsule 2  . fenofibrate (TRICOR) 145 MG tablet Take 1 tablet (145 mg total) by mouth every evening. 90 tablet 3  . FIBER PO Take 3 capsules by mouth 2 (two) times daily.    [name] Kitchen glucose blood (FREESTYLE LITE) test strip Use as instructed to test Blood Glucose twice daily Dx: E11.9 200 each 12  . Insulin Pen Needle (CAREFINE PEN NEEDLES) 31G X 8 MM MISC Use once daily with Victoza injections. 100 each 3  . Lancets (FREESTYLE) lancets USE 1 LANCET TWICE A DAY AS INSTRUCTED 200 each 3  . lisinopril (PRINIVIL,ZESTRIL) 5 MG tablet Take 1 tablet (5 mg total) by mouth every evening. 90 tablet 3  . Multiple Vitamin (MULTIVITAMIN) tablet Take 1 tablet by mouth daily.    [name] Kitchen omeprazole (PRILOSEC) 20 MG capsule TAKE 1 CAPSULE DAILY 90 capsule 3  . Probiotic Product (PROBIOTIC ADVANCED PO) Take by mouth.    . Probiotic Product (PROBIOTIC PO) Take 1 capsule by mouth daily.    . simvastatin (ZOCOR) 10 MG tablet Take 1 tablet (10 mg total) by mouth at bedtime. 90 tablet 2   No current facility-administered medications on file prior to visit.     Review of Systems:  As per HPI- otherwise negative.   Physical Examination: Vitals:   08/21/17 1344  BP: 122/72  Pulse: 91  Temp: 98.1 F (36.7 C)  SpO2: 95%   Vitals:   08/21/17 1344  Weight: 228 lb (103.4 kg)  Height: 5\' 11"  (1.803 m)   Body mass index is 31.8 kg/m. Ideal Body Weight: Weight in (lb) to have BMI = 25: 178.9  GEN: WDWN, NAD, Non-toxic, A & O x 3, looks well HEENT: Atraumatic, Normocephalic. Neck supple. No masses, No LAD. Ears and Nose: No external deformity. CV: RRR, No M/G/R. No  JVD. No thrill. No extra heart sounds. PULM: CTA B, no wheezes, crackles, rhonchi. No retractions. No resp. distress. No accessory muscle use. ABD: S, NT, ND, +BS. No rebound. No HSM.  Benign belly EXTR: No c/c/e NEURO Normal gait for pt, he wears bilateral afo PSYCH: Normally interactive. Conversant. Not depressed or anxious appearing.  Calm demeanor.    Assessment and Plan: Functional diarrhea - Plan: CBC  Immunization due - Plan: Flu vaccine HIGH DOSE PF (Fluzone High dose)  Controlled type 2 diabetes mellitus without complication, without long-term current use of insulin ([place]) - Plan: metFORMIN (GLUCOPHAGE) 500 MG tablet, Comprehensive metabolic panel, Hemoglobin A1c, CANCELED: Comprehensive metabolic panel, CANCELED: Hemoglobin A1c  Fecal urgency  Hyperlipidemia associated with type 2 diabetes mellitus ([place]) - Plan: Lipid panel, CANCELED: Lipid panel  Screening for deficiency anemia - Plan: CBC, CANCELED: CBC  Here today with fecal urgency/ diarrhea which is not new, but worse since we increased his dose of metformin.  Will decrease again to 500 mg BID He will let me know if not helpful Flu shot today He plans to return for fasting labs in a few days- ordered as above    Signed Lamar Blinks, MD

## 2017-08-21 ENCOUNTER — Ambulatory Visit (INDEPENDENT_AMBULATORY_CARE_PROVIDER_SITE_OTHER): Payer: Medicare Other | Admitting: Family Medicine

## 2017-08-21 VITALS — BP 122/72 | HR 91 | Temp 98.1°F | Ht 71.0 in | Wt 228.0 lb

## 2017-08-21 DIAGNOSIS — Z130 Encounter for screening for diseases of the blood and blood-forming organs and certain disorders involving the immune mechanism: Secondary | ICD-10-CM | POA: Diagnosis not present

## 2017-08-21 DIAGNOSIS — E119 Type 2 diabetes mellitus without complications: Secondary | ICD-10-CM

## 2017-08-21 DIAGNOSIS — Z23 Encounter for immunization: Secondary | ICD-10-CM

## 2017-08-21 DIAGNOSIS — E1169 Type 2 diabetes mellitus with other specified complication: Secondary | ICD-10-CM

## 2017-08-21 DIAGNOSIS — R152 Fecal urgency: Secondary | ICD-10-CM

## 2017-08-21 DIAGNOSIS — E785 Hyperlipidemia, unspecified: Secondary | ICD-10-CM

## 2017-08-21 DIAGNOSIS — K591 Functional diarrhea: Secondary | ICD-10-CM

## 2017-08-21 MED ORDER — METFORMIN HCL 500 MG PO TABS: 500.0000 mg | ORAL_TABLET | Freq: Two times a day (BID) | ORAL | 3 refills | Status: DC

## 2017-08-21 NOTE — Patient Instructions (Signed)
It was good to see you today as always!  We will get your blood work today and I will be in touch with your results asap  Please decrease your metformin to 500 mg twice a day.  I am hopeful that this will improve your diarrhea issues.  In the meantime, you may certainly use imodium OTC as needed (prior to going out, etc)

## 2017-08-23 ENCOUNTER — Other Ambulatory Visit: Payer: Self-pay | Admitting: Emergency Medicine

## 2017-08-23 ENCOUNTER — Encounter: Payer: Self-pay | Admitting: Family Medicine

## 2017-08-28 DIAGNOSIS — C44612 Basal cell carcinoma of skin of right upper limb, including shoulder: Secondary | ICD-10-CM | POA: Diagnosis not present

## 2017-08-28 DIAGNOSIS — C4441 Basal cell carcinoma of skin of scalp and neck: Secondary | ICD-10-CM | POA: Diagnosis not present

## 2017-08-31 ENCOUNTER — Other Ambulatory Visit (INDEPENDENT_AMBULATORY_CARE_PROVIDER_SITE_OTHER): Payer: Medicare Other

## 2017-08-31 DIAGNOSIS — E1169 Type 2 diabetes mellitus with other specified complication: Secondary | ICD-10-CM | POA: Diagnosis not present

## 2017-08-31 DIAGNOSIS — Z130 Encounter for screening for diseases of the blood and blood-forming organs and certain disorders involving the immune mechanism: Secondary | ICD-10-CM

## 2017-08-31 DIAGNOSIS — E785 Hyperlipidemia, unspecified: Secondary | ICD-10-CM | POA: Diagnosis not present

## 2017-08-31 DIAGNOSIS — E119 Type 2 diabetes mellitus without complications: Secondary | ICD-10-CM

## 2017-08-31 DIAGNOSIS — K591 Functional diarrhea: Secondary | ICD-10-CM

## 2017-08-31 LAB — COMPREHENSIVE METABOLIC PANEL
ALBUMIN: 4 g/dL (ref 3.5–5.2)
ALK PHOS: 34 U/L — AB (ref 39–117)
ALT: 16 U/L (ref 0–53)
AST: 10 U/L (ref 0–37)
BUN: 22 mg/dL (ref 6–23)
CHLORIDE: 110 meq/L (ref 96–112)
CO2: 29 mEq/L (ref 19–32)
Calcium: 9.7 mg/dL (ref 8.4–10.5)
Creatinine, Ser: 1.14 mg/dL (ref 0.40–1.50)
GFR: 65.79 mL/min (ref >60.00)
Glucose, Bld: 136 mg/dL — ABNORMAL HIGH (ref 70–99)
POTASSIUM: 4.5 meq/L (ref 3.5–5.1)
SODIUM: 144 meq/L (ref 135–145)
TOTAL PROTEIN: 6.6 g/dL (ref 6.0–8.3)
Total Bilirubin: 0.6 mg/dL (ref 0.2–1.2)

## 2017-08-31 LAB — LIPID PANEL
CHOLESTEROL: 119 mg/dL (ref 0–200)
HDL: 54.4 mg/dL (ref >39.00)
LDL CALC: 46 mg/dL (ref 0–99)
NonHDL: 65.02
Total CHOL/HDL Ratio: 2
Triglycerides: 97 mg/dL (ref 0.0–149.0)
VLDL: 19.4 mg/dL (ref 0.0–40.0)

## 2017-08-31 LAB — CBC
HEMATOCRIT: 40.6 % (ref 39.0–52.0)
HEMOGLOBIN: 13.3 g/dL (ref 13.0–17.0)
MCHC: 32.9 g/dL (ref 30.0–36.0)
MCV: 87.1 fl (ref 78.0–100.0)
Platelets: 244 10*3/uL (ref 150.0–400.0)
RBC: 4.66 Mil/uL (ref 4.22–5.81)
RDW: 14.7 % (ref 11.5–15.5)
WBC: 5.9 10*3/uL (ref 4.0–10.5)

## 2017-08-31 LAB — HEMOGLOBIN A1C: Hgb A1c MFr Bld: 6.6 % — ABNORMAL HIGH (ref 4.6–6.5)

## 2017-09-01 ENCOUNTER — Encounter: Payer: Self-pay | Admitting: Family Medicine

## 2017-09-03 ENCOUNTER — Encounter: Payer: Self-pay | Admitting: Family Medicine

## 2017-09-04 ENCOUNTER — Ambulatory Visit: Payer: Self-pay | Admitting: Family Medicine

## 2017-09-06 ENCOUNTER — Encounter: Payer: Self-pay | Admitting: Family Medicine

## 2017-09-06 DIAGNOSIS — K591 Functional diarrhea: Secondary | ICD-10-CM

## 2017-09-06 NOTE — Addendum Note (Signed)
Addended by: Lamar Blinks C on: 09/06/2017 09:30 PM   Modules accepted: Orders

## 2017-09-07 ENCOUNTER — Other Ambulatory Visit: Payer: Medicare Other

## 2017-09-07 ENCOUNTER — Encounter: Payer: Self-pay | Admitting: Internal Medicine

## 2017-09-07 ENCOUNTER — Encounter: Payer: Self-pay | Admitting: Gastroenterology

## 2017-09-07 DIAGNOSIS — K591 Functional diarrhea: Secondary | ICD-10-CM | POA: Diagnosis not present

## 2017-09-08 LAB — CLOSTRIDIUM DIFFICILE BY PCR: CDIFFPCR: NOT DETECTED

## 2017-09-12 ENCOUNTER — Other Ambulatory Visit: Payer: Self-pay | Admitting: Family Medicine

## 2017-09-12 DIAGNOSIS — M1712 Unilateral primary osteoarthritis, left knee: Secondary | ICD-10-CM

## 2017-09-15 ENCOUNTER — Encounter: Payer: Self-pay | Admitting: Gastroenterology

## 2017-09-15 ENCOUNTER — Ambulatory Visit (INDEPENDENT_AMBULATORY_CARE_PROVIDER_SITE_OTHER): Payer: Medicare Other | Admitting: Gastroenterology

## 2017-09-15 ENCOUNTER — Encounter: Payer: Self-pay | Admitting: Family Medicine

## 2017-09-15 VITALS — BP 116/66 | HR 84 | Ht 69.0 in | Wt 229.1 lb

## 2017-09-15 DIAGNOSIS — R159 Full incontinence of feces: Secondary | ICD-10-CM

## 2017-09-15 DIAGNOSIS — R197 Diarrhea, unspecified: Secondary | ICD-10-CM | POA: Diagnosis not present

## 2017-09-15 MED ORDER — NA SULFATE-K SULFATE-MG SULF 17.5-3.13-1.6 GM/177ML PO SOLN: 1.0000 | Freq: Once | ORAL | 0 refills | Status: AC

## 2017-09-15 MED ORDER — CHOLESTYRAMINE 4 G PO PACK: 4.0000 g | PACK | Freq: Two times a day (BID) | ORAL | 2 refills | Status: DC

## 2017-09-15 NOTE — Progress Notes (Signed)
Assessment and plans reviewed  

## 2017-09-15 NOTE — Progress Notes (Addendum)
09/15/2017 Matthew Nash [id number] 22-Jun-1936   HISTORY OF PRESENT ILLNESS:  This is a pleasant 81 year old male who is new to our office. He was initially scheduled for an appointment with Dr. Henrene Pastor in November, but wanted to be seen sooner. He is requesting Dr. Henrene Pastor as his physician since his wife sees Dr. Henrene Pastor as well. He's been referred here by his PCP, Dr. Lorelei Pont, for evaluation regarding diarrhea and fecal incontinence. The patient tells me that he's had a lot of issues with severe fecal incontinence recently. He says that the first time he ever experienced this incontinence was in 2016. Recently, however, he has had multiple episodes of this and it has understandably become very distressing to him. He [place]. This has occurred several other times since then. He says that usually when this occurs it is a loose-type stool, but there are times that he has normal formed stools and has had some slight incontinence with those as well. He tells me that when this occurs that his "bowels just let loose".  He has no real warning or time to get anywhere. He tells me his last colonoscopy was in 2002 or 2003. He has seen GI in both [place] as well as with Dr. Collene Mares. We are requesting records from Dr. Lorie Apley office. He is status post cholecystectomy in the 90s. I do see some pathology results in here from an endoscopy that he had performed and duodenal biopsies were negative for celiac disease at that time, in 2012. He denies seeing blood in his stool.  He was on metformin 1000 mg twice daily, but his PCP decrease it to 500 mg twice daily. He has now been off of the metformin altogether for the past 7-10 days as they were thinking that could've been contributing to his diarrheal issues. Recent C. difficile stool study was negative. CBC and CMP within normal limits.   Past Medical History:  Diagnosis Date  . Arthritis    both  knees  . Back pain at L4-L5 level   . Basal cell carcinoma   . BPH (benign prostatic hyperplasia)    hx s/p turp  . Chronic back pain    spondylosis  . Chronic kidney disease    kidney stones 1979  . DDD (degenerative disc disease), lumbar   . Diabetes mellitus    takes Metformin and Victoza daily-under control  . Diarrhea    x 1 today  . Diverticulitis   . Gallstones   . GERD (gastroesophageal reflux disease)    takes Omeprazole daily-under control  . H/O hiatal hernia   . H/O measles   . Hepatitis B    B-with Mono and jaundice 1960  . History of chicken pox   . History of colon polyps   . History of kidney stones   . Hyperlipidemia    takes Simvastatin and Tricor daily-under control  . Hypertension    takes Lisinopril daily-under control  . Joint pain    both knees  . Joint swelling    rt knee  . Kidney stones   . Melanoma ([place])    right hand;basal cell carcinoma  . Neuropathy   . Peripheral vascular disease ([place])    diabetic neuropathy  . Pneumonia    hx of  . Prostate infection    currently taking macrobid  . Sleep apnea with use of continuous positive airway pressure (CPAP)   .  Tinnitus   . Urinary incontinence   . UTI (lower urinary tract infection)    Past Surgical History:  Procedure Laterality Date  . BASAL CELL CARCINOMA EXCISION    . BOTOX INJECTION  03/22/2017   In the bladder for nightly incontinence.  . C5 and T1 bone chip removed   1986  . [place]  . CHOLECYSTECTOMY  1993  . COLONOSCOPY  2011  . cortisone injection    . EYE SURGERY     cataract sx 2017 bilateral  . I&D left knee Left 1958   states 22 times  . KNEE ARTHROSCOPY Right 07/16/2014   Procedure: RIGHT ARTHROSCOPY KNEE WITH Medial and Lateral DEBRIDEMENT and chondroplasty;  Surgeon: Gearlean Alf, MD;  Location: [facility];  Service: Orthopedics;  Laterality: Right;  . LUMBAR LAMINECTOMY/DECOMPRESSION MICRODISCECTOMY Right 02/17/2014   Procedure: RIGHT LUMBAR  TWO-THREE LAMINECTOMY;  Surgeon: Charlie Pitter, MD;  Location: [facility];  Service: Neurosurgery;  Laterality: Right;  right   . right shoulder surgery  2012   rotator cuff repair  . SHOULDER ARTHROSCOPY W/ ROTATOR CUFF REPAIR Left NOV 2010  . TONSILLECTOMY  1942  . TOTAL KNEE ARTHROPLASTY Right 05/27/2015   Procedure: RIGHT TOTAL KNEE ARTHROPLASTY;  Surgeon: Gaynelle Arabian, MD;  Location: [facility];  Service: Orthopedics;  Laterality: Right;  . TOTAL KNEE ARTHROPLASTY Left 05/30/2016   Procedure: LEFT TOTAL KNEE ARTHROPLASTY;  Surgeon: Gaynelle Arabian, MD;  Location: [facility];  Service: Orthopedics;  Laterality: Left;  . TRANSURETHRAL RESECTION OF PROSTATE  2006  . VASECTOMY  1983  . WISDOM TOOTH EXTRACTION      reports that he quit smoking about 39 years ago. His smoking use included Pipe. He has never used smokeless tobacco. He reports that he drinks alcohol. He reports that he does not use drugs. family history includes Asthma in his sister; Bladder Cancer in his mother; Breast cancer in his mother; Healthy in his brother, daughter, and son; Heart disease (age of onset: 79) in his mother; Obesity in his son; Prostate cancer in his father; Skin cancer in his brother. Allergies  Allergen Reactions  . Albamycin [Novobiocin] Other (See Comments)    Made me look like a strawberry   . Penicillins Rash and Other (See Comments)    Has patient had a PCN reaction causing immediate rash, facial/tongue/throat swelling, SOB or lightheadedness with hypotension:  no Has patient had a PCN reaction causing severe rash involving mucus membranes or skin necrosis: no Has patient had a PCN reaction that required hospitalization no Has patient had a PCN reaction occurring within the last 10 years: no - childhood reaction If all of the above answers are "NO", then may proceed with Cephalosporin use.       Outpatient Encounter Prescriptions as of 09/15/2017  Medication Sig  . aspirin EC 81 MG tablet Take 81 mg by  mouth daily.  [name] Kitchen CALCIUM-MAGNESIUM-VITAMIN D PO Take 2 tablets by mouth 2 (two) times daily.   . celecoxib (CELEBREX) 200 MG capsule TAKE 1 CAPSULE TWICE A DAY  . cephALEXin (KEFLEX) 500 MG capsule Take 500 mg by mouth daily.  . fenofibrate (TRICOR) 145 MG tablet Take 1 tablet (145 mg total) by mouth every evening.  [name] Kitchen FIBER PO Take 2 capsules by mouth 2 (two) times daily.   [name] Kitchen glucose blood (FREESTYLE LITE) test strip Use as instructed to test Blood Glucose twice daily Dx: E11.9  . Lancets (FREESTYLE) lancets USE 1 LANCET TWICE A  DAY AS INSTRUCTED  . lisinopril (PRINIVIL,ZESTRIL) 5 MG tablet Take 1 tablet (5 mg total) by mouth every evening.  . Melatonin 10 MG TABS Take 1 tablet by mouth at bedtime.  . metFORMIN (GLUCOPHAGE) 500 MG tablet Take 1 tablet (500 mg total) by mouth 2 (two) times daily with a meal.  . Multiple Vitamin (MULTIVITAMIN) tablet Take 1 tablet by mouth daily.  [name] Kitchen omeprazole (PRILOSEC) 20 MG capsule TAKE 1 CAPSULE DAILY (Patient taking differently: TAKE 1 CAPSULE BY MOUTH TWICE DAILY)  . Probiotic Product (PROBIOTIC ADVANCED PO) Take by mouth.  . simvastatin (ZOCOR) 10 MG tablet Take 1 tablet (10 mg total) by mouth at bedtime.  . vitamin C (ASCORBIC ACID) 500 MG tablet Take 500 mg by mouth daily.  . cholestyramine (QUESTRAN) 4 g packet Take 1 packet (4 g total) by mouth 2 (two) times daily.  . Na Sulfate-K Sulfate-Mg Sulf 17.5-3.13-1.6 GM/180ML SOLN Take 1 kit by mouth once.  . [DISCONTINUED] Insulin Pen Needle (CAREFINE PEN NEEDLES) 31G X 8 MM MISC Use once daily with Victoza injections.   No facility-administered encounter medications on file as of 09/15/2017.      REVIEW OF SYSTEMS  : All other systems reviewed and negative except where noted in the History of Present Illness.   PHYSICAL EXAM: BP 116/66 (BP Location: Left Arm, Patient Position: Sitting, Cuff Size: Normal)   Pulse 84   Ht 5' 9"  (1.753 m) Comment: height measured without shoes  Wt 229 lb 2 oz (103.9 kg)    BMI 33.84 kg/m  General: Well developed white male in no acute distress Head: Normocephalic and atraumatic Eyes:  Sclerae anicteric, conjunctiva pink. Ears: Normal auditory acuity Lungs: Clear throughout to auscultation; no increased WOB. Heart: Regular rate and rhythm; no M/R/G. Abdomen: Soft, non-distended.  BS present.  Non-tender.  Ventral hernia noted. Rectal:  Will be done at the time of colonoscopy. Musculoskeletal: Symmetrical with no gross deformities  Skin: No lesions on visible extremities Extremities: No edema  Neurological: Alert oriented x 4, grossly non-focal Psychological:  Alert and cooperative. Normal mood and affect  ASSESSMENT AND PLAN: *Diarrhea with fecal incontinence:  I think that he may have a few things causing  his diarrhea. He very well may have some bile salt related diarrhea from his cholecystectomy. Metformin certainly may not be helping the situation. He also likely has some IBS as a result of his frequent episodes of incontinence, and his nerves are likely just worsening his diarrhea issues as well. 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  He has not had a colonoscopy in several years. I am to schedule for colonoscopy with Dr. Henrene Pastor. Hopefully he will perform random biopsies to rule out microscopic colitis. I'm going to give him Questran to take, initially one packet daily and can increase to 2 packs daily if needed. Pending results of colonoscopy he may need anorectal manometry and then possibly pelvic floor therapy.  **Will request previous GI records form Dr. Collene Mares.  Records received.  Colonoscopy 04/2002 showed pandiverticulosis.  **The risks, benefits, and alternatives to colonoscopy were discussed with the patient and he consents to proceed.    CC:  Copland, Gay Filler, MD

## 2017-09-15 NOTE — Patient Instructions (Signed)
If you are age 79 or older, your body mass index should be between 23-30. Your Body mass index is 33.84 kg/m. If this is out of the aforementioned range listed, please consider follow up with your Primary Care Provider.  If you are age 14 or younger, your body mass index should be between 19-25. Your Body mass index is 33.84 kg/m. If this is out of the aformentioned range listed, please consider follow up with your Primary Care Provider.   We have sent the following medications to your pharmacy for you to pick up at your convenience:  [place] have been scheduled for a colonoscopy. Please follow written instructions given to you at your visit today.  Please pick up your prep supplies at the pharmacy within the next 1-3 days. If you use inhalers (even only as needed), please bring them with you on the day of your procedure. Your physician has requested that you go to www.startemmi.com and enter the access code given to you at your visit today. This web site gives a general overview about your procedure. However, you should still follow specific instructions given to you by our office regarding your preparation for the procedure.  Thank you.

## 2017-09-20 ENCOUNTER — Encounter: Payer: Self-pay | Admitting: Internal Medicine

## 2017-09-20 ENCOUNTER — Ambulatory Visit (AMBULATORY_SURGERY_CENTER): Payer: Medicare Other | Admitting: Internal Medicine

## 2017-09-20 VITALS — BP 154/85 | HR 72 | Temp 98.0°F | Resp 15 | Ht 69.0 in | Wt 229.0 lb

## 2017-09-20 DIAGNOSIS — K635 Polyp of colon: Secondary | ICD-10-CM

## 2017-09-20 DIAGNOSIS — R197 Diarrhea, unspecified: Secondary | ICD-10-CM

## 2017-09-20 DIAGNOSIS — R159 Full incontinence of feces: Secondary | ICD-10-CM

## 2017-09-20 DIAGNOSIS — D125 Benign neoplasm of sigmoid colon: Secondary | ICD-10-CM | POA: Diagnosis not present

## 2017-09-20 MED ORDER — SODIUM CHLORIDE 0.9 % IV SOLN: 500.0000 mL | INTRAVENOUS | Status: DC

## 2017-09-20 NOTE — Patient Instructions (Signed)
YOU HAD AN ENDOSCOPIC PROCEDURE TODAY AT [facility]:   Refer to the procedure report that was given to you for any specific questions about what was found during the examination.  If the procedure report does not answer your questions, please call your gastroenterologist to clarify.  If you requested that your care partner not be given the details of your procedure findings, then the procedure report has been included in a sealed envelope for you to review at your convenience later.  YOU SHOULD EXPECT: Some feelings of bloating in the abdomen. Passage of more gas than usual.  Walking can help get rid of the air that was put into your GI tract during the procedure and reduce the bloating. If you had a lower endoscopy (such as a colonoscopy or flexible sigmoidoscopy) you may notice spotting of blood in your stool or on the toilet paper. If you underwent a bowel prep for your procedure, you may not have a normal bowel movement for a few days.  Please Note:  You might notice some irritation and congestion in your nose or some drainage.  This is from the oxygen used during your procedure.  There is no need for concern and it should clear up in a day or so.  SYMPTOMS TO REPORT IMMEDIATELY:   Following lower endoscopy (colonoscopy or flexible sigmoidoscopy):  Excessive amounts of blood in the stool  Significant tenderness or worsening of abdominal pains  Swelling of the abdomen that is new, acute  Fever of 100F or higher   Following upper endoscopy (EGD)  Vomiting of blood or coffee ground material  New chest pain or pain under the shoulder blades  Painful or persistently difficult swallowing  New shortness of breath  Fever of 100F or higher  Black, tarry-looking stools  For urgent or emergent issues, a gastroenterologist can be reached at any hour by calling [phone].   DIET:  We do recommend a small meal at first, but then you may proceed to your regular diet.  Drink  plenty of fluids but you should avoid alcoholic beverages for 24 hours.  ACTIVITY:  You should plan to take it easy for the rest of today and you should NOT DRIVE or use heavy machinery until tomorrow (because of the sedation medicines used during the test).    FOLLOW UP: Our staff will call the number listed on your records the next business day following your procedure to check on you and address any questions or concerns that you may have regarding the information given to you following your procedure. If we do not reach you, we will leave a message.  However, if you are feeling well and you are not experiencing any problems, there is no need to return our call.  We will assume that you have returned to your regular daily activities without incident.  If any biopsies were taken you will be contacted by phone or by letter within the next 1-3 weeks.  Please call us at [phone] if you have not heard about the biopsies in 3 weeks.    SIGNATURES/CONFIDENTIALITY: You and/or your care partner have signed paperwork which will be entered into your electronic medical record.  These signatures attest to the fact that that the information above on your After Visit Summary has been reviewed and is understood.  Full responsibility of the confidentiality of this discharge information lies with you and/or your care-partner.  Polyp, diverticulosis, hemorrhoid and high fiber diet information given.

## 2017-09-20 NOTE — Progress Notes (Signed)
Called to room to assist during endoscopic procedure.  Patient ID and intended procedure confirmed with present staff. Received instructions for my participation in the procedure from the performing physician.  

## 2017-09-20 NOTE — Op Note (Signed)
[facility] Patient Name: Matthew Nash Procedure Date: 09/20/2017 2:17 PM MRN: [id number] Endoscopist: Docia Chuck. Henrene Nash , MD Age: 81 Referring MD:  Date of Birth: [birth year] Gender: Male Account #: [id number] Procedure:                Colonoscopy, with cold snare polypectomy and                            biopsies Indications:              Clinically significant diarrhea of unexplained                            origin, Fecal incontinence Medicines:                Monitored Anesthesia Care Procedure:                Pre-Anesthesia Assessment:                           - Prior to the procedure, a History and Physical                            was performed, and patient medications and                            allergies were reviewed. The patient's tolerance of                            previous anesthesia was also reviewed. The risks                            and benefits of the procedure and the sedation                            options and risks were discussed with the patient.                            All questions were answered, and informed consent                            was obtained. Prior Anticoagulants: The patient has                            taken no previous anticoagulant or antiplatelet                            agents. ASA Grade Assessment: II - A patient with                            mild systemic disease. After reviewing the risks                            and benefits, the patient was deemed in  satisfactory condition to undergo the procedure.                           After obtaining informed consent, the colonoscope                            was passed under direct vision. Throughout the                            procedure, the patient's blood pressure, pulse, and                            oxygen saturations were monitored continuously. The                            Colonoscope was introduced through the anus and                             advanced to the the cecum, identified by                            appendiceal orifice and ileocecal valve. The                            ileocecal valve, appendiceal orifice, and rectum                            were photographed. The quality of the bowel                            preparation was good. The colonoscopy was performed                            without difficulty. The patient tolerated the                            procedure well. The bowel preparation used was                            SUPREP. NO RECTAL TONE ON DIGITAL RECTAL EXAM Scope In: 2:29:57 PM Scope Out: 2:46:03 PM Scope Withdrawal Time: 0 hours 8 minutes 28 seconds  Total Procedure Duration: 0 hours 16 minutes 6 seconds  Findings:                 A 6 mm polyp was found in the sigmoid colon. The                            polyp was sessile. The polyp was removed with a                            cold snare. Resection and retrieval were complete.                           Multiple small and large-mouthed  diverticula were                            found in the entire colon.                           Internal hemorrhoids were found during                            retroflexion. Random colon Biopsies were taken with                            a cold forceps for histology, to rule out                            microscopic.                           The exam was otherwise without abnormality on                            direct and retroflexion views. Complications:            No immediate complications. Estimated blood loss:                            None. Estimated Blood Loss:     Estimated blood loss: none. Impression:               - One 6 mm polyp in the sigmoid colon, removed with                            a cold snare. Resected and retrieved.                           - Severe Diverticulosis in the entire examined                            colon.                            - Internal hemorrhoids.                           - The examination was otherwise normal on direct                            and retroflexion views Status post random colon                            biopsies.                           - ABSENT rectal tone Recommendation:           - Repeat colonoscopy is not recommended for  surveillance.                           - Patient has a contact number available for                            emergencies. The signs and symptoms of potential                            delayed complications were discussed with the                            patient. Return to normal activities tomorrow.                            Written discharge instructions were provided to the                            patient.                           - Resume previous diet.                           - Continue present medications.                           - Await pathology results.                           - Purchase and wear protective undergarments Matthew Shugart N. Henrene Pastor, MD 09/20/2017 2:54:35 PM This report has been signed electronically.

## 2017-09-20 NOTE — Progress Notes (Signed)
To PACU, VSS. Report to [email or web address] 

## 2017-09-21 ENCOUNTER — Telehealth: Payer: Self-pay | Admitting: *Deleted

## 2017-09-21 NOTE — Telephone Encounter (Signed)
  Follow up Call-  Call back number 09/20/2017  Post procedure Call Back phone  # [phone]  Permission to leave phone message Yes  Some recent data might be hidden     Patient questions:  Do you have a fever, pain , or abdominal swelling? No. Pain Score  0 *  Have you tolerated food without any problems? Yes.    Have you been able to return to your normal activities? Yes.    Do you have any questions about your discharge instructions: Diet   No. Medications  No. Follow up visit  No.  Do you have questions or concerns about your Care? No.  Actions: * If pain score is 4 or above: No action needed, pain <4.

## 2017-09-26 ENCOUNTER — Encounter: Payer: Self-pay | Admitting: Internal Medicine

## 2017-10-05 DIAGNOSIS — C44612 Basal cell carcinoma of skin of right upper limb, including shoulder: Secondary | ICD-10-CM | POA: Diagnosis not present

## 2017-10-11 DIAGNOSIS — C44319 Basal cell carcinoma of skin of other parts of face: Secondary | ICD-10-CM | POA: Diagnosis not present

## 2017-10-16 ENCOUNTER — Encounter: Payer: Self-pay | Admitting: Family Medicine

## 2017-10-20 DIAGNOSIS — R32 Unspecified urinary incontinence: Secondary | ICD-10-CM | POA: Diagnosis not present

## 2017-10-27 ENCOUNTER — Encounter: Payer: Self-pay | Admitting: Family Medicine
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
## 2017-10-30 ENCOUNTER — Ambulatory Visit (INDEPENDENT_AMBULATORY_CARE_PROVIDER_SITE_OTHER): Payer: Medicare Other | Admitting: Family Medicine

## 2017-10-30 ENCOUNTER — Encounter: Payer: Self-pay | Admitting: Family Medicine

## 2017-10-30 VITALS — BP 140/75 | HR 84 | Temp 98.1°F | Resp 14 | Wt 232.8 lb

## 2017-10-30 DIAGNOSIS — Z9989 Dependence on other enabling machines and devices: Secondary | ICD-10-CM

## 2017-10-30 DIAGNOSIS — G4733 Obstructive sleep apnea (adult) (pediatric): Secondary | ICD-10-CM | POA: Diagnosis not present

## 2017-10-30 DIAGNOSIS — G63 Polyneuropathy in diseases classified elsewhere: Secondary | ICD-10-CM | POA: Diagnosis not present

## 2017-10-30 DIAGNOSIS — G4769 Other sleep related movement disorders: Secondary | ICD-10-CM

## 2017-10-30 DIAGNOSIS — Z789 Other specified health status: Secondary | ICD-10-CM

## 2017-10-30 DIAGNOSIS — E119 Type 2 diabetes mellitus without complications: Secondary | ICD-10-CM | POA: Diagnosis not present

## 2017-10-30 MED ORDER — CLONAZEPAM 0.5 MG PO TABS: 0.5000 mg | ORAL_TABLET | Freq: Every evening | ORAL | 0 refills | Status: DC | PRN

## 2017-10-30 NOTE — Patient Instructions (Addendum)
It was good to see you again today!  Take care, I will refer you to Dr. Elsworth Soho for your sleep apnea I sent your rx for Klonopin 0.5 to the HT for you- try this at bedtime.  Let me know how it works for you  Please do be careful with your toenails. You may want to try visiting a nail shop with experience in neuropathy/ diabetes to help cut your nails, or we could have you see podiatry   Please see me in about 2 months

## 2017-11-01 ENCOUNTER — Ambulatory Visit: Payer: Medicare Other | Admitting: Internal Medicine

## 2017-11-09 ENCOUNTER — Encounter: Payer: Self-pay | Admitting: Family Medicine

## 2017-11-15 ENCOUNTER — Other Ambulatory Visit: Payer: Self-pay | Admitting: Family Medicine

## 2017-11-15 DIAGNOSIS — E785 Hyperlipidemia, unspecified: Secondary | ICD-10-CM

## 2017-11-20 ENCOUNTER — Encounter (HOSPITAL_BASED_OUTPATIENT_CLINIC_OR_DEPARTMENT_OTHER): Payer: Self-pay | Admitting: *Deleted

## 2017-11-20 ENCOUNTER — Other Ambulatory Visit: Payer: Self-pay

## 2017-11-20 ENCOUNTER — Emergency Department (HOSPITAL_BASED_OUTPATIENT_CLINIC_OR_DEPARTMENT_OTHER): Payer: Medicare Other

## 2017-11-20 ENCOUNTER — Emergency Department (HOSPITAL_BASED_OUTPATIENT_CLINIC_OR_DEPARTMENT_OTHER)
Admission: EM | Admit: 2017-11-20 | Discharge: 2017-11-20 | Disposition: A | Discharge status: Home / Self Care | Payer: Medicare Other | Attending: Emergency Medicine | Admitting: Emergency Medicine

## 2017-11-20 DIAGNOSIS — S0990XA Unspecified injury of head, initial encounter: Secondary | ICD-10-CM

## 2017-11-20 DIAGNOSIS — Z87891 Personal history of nicotine dependence: Secondary | ICD-10-CM | POA: Insufficient documentation

## 2017-11-20 DIAGNOSIS — Z85828 Personal history of other malignant neoplasm of skin: Secondary | ICD-10-CM | POA: Diagnosis not present

## 2017-11-20 DIAGNOSIS — M542 Cervicalgia: Secondary | ICD-10-CM | POA: Diagnosis not present

## 2017-11-20 DIAGNOSIS — E1122 Type 2 diabetes mellitus with diabetic chronic kidney disease: Secondary | ICD-10-CM | POA: Insufficient documentation

## 2017-11-20 DIAGNOSIS — Y929 Unspecified place or not applicable: Secondary | ICD-10-CM | POA: Insufficient documentation

## 2017-11-20 DIAGNOSIS — Z7984 Long term (current) use of oral hypoglycemic drugs: Secondary | ICD-10-CM | POA: Insufficient documentation

## 2017-11-20 DIAGNOSIS — S2241XA Multiple fractures of ribs, right side, initial encounter for closed fracture: Secondary | ICD-10-CM | POA: Diagnosis not present

## 2017-11-20 DIAGNOSIS — S2232XA Fracture of one rib, left side, initial encounter for closed fracture: Secondary | ICD-10-CM | POA: Diagnosis not present

## 2017-11-20 DIAGNOSIS — E1140 Type 2 diabetes mellitus with diabetic neuropathy, unspecified: Secondary | ICD-10-CM | POA: Insufficient documentation

## 2017-11-20 DIAGNOSIS — Z85820 Personal history of malignant melanoma of skin: Secondary | ICD-10-CM | POA: Diagnosis not present

## 2017-11-20 DIAGNOSIS — W19XXXA Unspecified fall, initial encounter: Secondary | ICD-10-CM | POA: Insufficient documentation

## 2017-11-20 DIAGNOSIS — I129 Hypertensive chronic kidney disease with stage 1 through stage 4 chronic kidney disease, or unspecified chronic kidney disease: Secondary | ICD-10-CM | POA: Diagnosis not present

## 2017-11-20 DIAGNOSIS — Y999 Unspecified external cause status: Secondary | ICD-10-CM | POA: Insufficient documentation

## 2017-11-20 DIAGNOSIS — S199XXA Unspecified injury of neck, initial encounter: Secondary | ICD-10-CM | POA: Diagnosis not present

## 2017-11-20 DIAGNOSIS — N189 Chronic kidney disease, unspecified: Secondary | ICD-10-CM | POA: Insufficient documentation

## 2017-11-20 DIAGNOSIS — Z7982 Long term (current) use of aspirin: Secondary | ICD-10-CM | POA: Diagnosis not present

## 2017-11-20 DIAGNOSIS — Y939 Activity, unspecified: Secondary | ICD-10-CM | POA: Insufficient documentation

## 2017-11-20 DIAGNOSIS — R51 Headache: Secondary | ICD-10-CM | POA: Diagnosis not present

## 2017-11-20 MED ORDER — ACETAMINOPHEN 500 MG PO TABS
1000.0000 mg | ORAL_TABLET | Freq: Once | ORAL | Status: AC
  Administered 2017-11-20: 1000 mg via ORAL
  Filled 2017-11-20: qty 2

## 2017-11-20 NOTE — Discharge Instructions (Signed)
You may take Tylenol 1000 mg every 6 hours as needed for pain.  Please use your incentive spirometer every 1-2 hours while awake for the next 2 weeks.

## 2017-11-20 NOTE — ED Provider Notes (Signed)
TIME SEEN: 2:05 PM  CHIEF COMPLAINT: Fall, head injury, left rib pain  HPI: Patient is a 81 year old male with history of diabetes, hypertension, hyperlipidemia who wears braces on both legs who presents to the emergency department after he tripped and fell yesterday landing on his left side.  States he did strike his head and thinks he lost consciousness momentarily.  He is on 81 mg of aspirin but no other anticoagulation or antiplatelets.  Complaining of headache.  He has left anterior and lateral lower rib pain.  No weakness.  ROS: See HPI Constitutional: no fever  Eyes: no drainage  ENT: no runny nose   Cardiovascular:  no chest pain  Resp: no SOB  GI: no vomiting GU: no dysuria Integumentary: no rash  Allergy: no hives  Musculoskeletal: no leg swelling  Neurological: no slurred speech ROS otherwise negative  PAST MEDICAL HISTORY/PAST SURGICAL HISTORY:  Past Medical History:  Diagnosis Date  . Arthritis    both knees  . Back pain at L4-L5 level   . Basal cell carcinoma   . BPH (benign prostatic hyperplasia)    hx s/p turp  . Chronic back pain    spondylosis  . Chronic kidney disease    kidney stones 1979  . DDD (degenerative disc disease), lumbar   . Diabetes mellitus    takes Metformin and Victoza daily-under control  . Diarrhea    x 1 today  . Diverticulitis   . Gallstones   . GERD (gastroesophageal reflux disease)    takes Omeprazole daily-under control  . H/O hiatal hernia   . H/O measles   . Hepatitis B    B-with Mono and jaundice 1960  . History of chicken pox   . History of colon polyps   . History of kidney stones   . Hyperlipidemia    takes Simvastatin and Tricor daily-under control  . Hypertension    takes Lisinopril daily-under control  . Joint pain    both knees  . Joint swelling    rt knee  . Kidney stones   . Melanoma ([place])    right hand;basal cell carcinoma  . Neuropathy   . Peripheral vascular disease ([place])    diabetic neuropathy  .  Pneumonia    hx of  . Prostate infection    currently taking macrobid  . Sleep apnea with use of continuous positive airway pressure (CPAP)   . Tinnitus   . Urinary incontinence   . UTI (lower urinary tract infection)     MEDICATIONS:  Prior to Admission medications   Medication Sig Start Date End Date Taking? Authorizing Provider  aspirin EC 81 MG tablet Take 81 mg by mouth daily.    [provider]  calcium gluconate 1 g, magnesium sulfate 1 g in sodium chloride 0.9 % 100 mL Take 185 mg by mouth 2 (two) times daily after a meal.    [provider]  CALCIUM-MAGNESIUM-VITAMIN D PO Take 2 tablets by mouth 2 (two) times daily.     [provider]  celecoxib (CELEBREX) 200 MG capsule TAKE 1 CAPSULE TWICE A DAY 09/13/17   Copland, Gay Filler, MD  cephALEXin (KEFLEX) 500 MG capsule Take 500 mg by mouth daily.    [provider]  cholestyramine (QUESTRAN) 4 g packet Take 1 packet (4 g total) by mouth 2 (two) times daily. 09/15/17   Zehr, Laban Emperor, PA-C  clonazePAM (KLONOPIN) 0.5 MG tablet Take 1 tablet (0.5 mg total) at bedtime as needed by  mouth for anxiety. 10/30/17   Copland, Gay Filler, MD  FIBER PO Take 2 capsules by mouth 2 (two) times daily.     [provider]  glucose blood (FREESTYLE LITE) test strip Use as instructed to test Blood Glucose twice daily Dx: E11.9 Patient not taking: Reported on 09/20/2017 02/08/17   Copland, Gay Filler, MD  Lancets (FREESTYLE) lancets USE 1 LANCET TWICE A DAY AS INSTRUCTED Patient not taking: Reported on 09/20/2017 01/29/17   Copland, Gay Filler, MD  lisinopril (PRINIVIL,ZESTRIL) 5 MG tablet Take 5 mg by mouth daily. 02/17/17   [provider]  Melatonin 10 MG TABS Take 1 tablet by mouth at bedtime.    [provider]  metFORMIN (GLUCOPHAGE) 500 MG tablet Take 1 tablet (500 mg total) by mouth 2 (two) times daily with a meal. Patient not taking: Reported on 09/20/2017 08/21/17   Copland, Gay Filler, MD   Multiple Vitamin (MULTI-VITAMINS) TABS Take 1 g by mouth daily.    [provider]  omeprazole (PRILOSEC) 20 MG capsule TAKE 1 CAPSULE DAILY Patient taking differently: TAKE 1 CAPSULE BY MOUTH TWICE DAILY 06/15/17   Copland, Gay Filler, MD  Probiotic Product (PROBIOTIC ADVANCED PO) Take by mouth.    [provider]  simvastatin (ZOCOR) 10 MG tablet Take 1 tablet (10 mg total) by mouth at bedtime. 08/07/17   Copland, Gay Filler, MD  TRICOR 145 MG tablet TAKE 1 TABLET EVERY EVENING 11/16/17   Copland, Gay Filler, MD  vitamin C (ASCORBIC ACID) 500 MG tablet Take 500 mg by mouth daily.    [provider]    ALLERGIES:  Allergies  Allergen Reactions  . Albamycin [Novobiocin] Other (See Comments)    Made me look like a strawberry   . Penicillins Rash and Other (See Comments)    Has patient had a PCN reaction causing immediate rash, facial/tongue/throat swelling, SOB or lightheadedness with hypotension:  no Has patient had a PCN reaction causing severe rash involving mucus membranes or skin necrosis: no Has patient had a PCN reaction that required hospitalization no Has patient had a PCN reaction occurring within the last 10 years: no - childhood reaction If all of the above answers are "NO", then may proceed with Cephalosporin use.     SOCIAL HISTORY:  Social History   Tobacco Use  . Smoking status: Former Smoker    Types: Pipe    Last attempt to quit: 07/14/1978    Years since quitting: 39.3  . Smokeless tobacco: Never Used  . Tobacco comment: quit smoking 1979  Substance Use Topics  . Alcohol use: Yes    Comment: OCCASIONAL    FAMILY HISTORY: Family History  Problem Relation Age of Onset  . Heart disease Mother 12       Deceased  . Bladder Cancer Mother   . Breast cancer Mother   . Prostate cancer Father        Deceased-in 4s  . Healthy Brother   . Skin cancer Brother        "basal cell cancer in the bloodstream"  . Asthma Sister        Deceased  .  Healthy Son        #1  . Healthy Daughter        #2  . Obesity Son        #2    EXAM: BP (!) 144/78   Pulse 83   Temp 97.7 F (36.5 C)   Resp 16  Ht 5\' 10"  (1.778 m)   Wt 99.8 kg (220 lb)   SpO2 98%   BMI 31.57 kg/m  CONSTITUTIONAL: Alert and oriented and responds appropriately to questions. Well-appearing; well-nourished; GCS 15 HEAD: Normocephalic; ecchymosis above the left eye EYES: Conjunctivae clear, PERRL, EOMI no subconjunctival hemorrhage or ENT: normal nose; no rhinorrhea; moist mucous membranes; pharynx without lesions noted; no dental injury; no septal hematoma NECK: Supple, no meningismus, no LAD; no midline spinal tenderness, step-off or deformity; trachea midline CARD: RRR; S1 and S2 appreciated; no murmurs, no clicks, no rubs, no gallops RESP: Normal chest excursion without splinting or tachypnea; breath sounds clear and equal bilaterally; no wheezes, no rhonchi, no rales; no hypoxia or respiratory distress CHEST:  chest wall stable, no crepitus or ecchymosis or deformity, left lower anterior and lateral ribs; no flail chest ABD/GI: Normal bowel sounds; non-distended; soft, non-tender, specifically no tenderness in the left upper quadrant, no rebound, no guarding; no ecchymosis or other lesions noted PELVIS:  stable, nontender to palpation BACK:  The back appears normal and is non-tender to palpation, there is no CVA tenderness; no midline spinal tenderness, step-off or deformity EXT: Normal ROM in all joints; non-tender to palpation; no edema; normal capillary refill; no cyanosis, no bony tenderness or bony deformity of patient's extremities, no joint effusion, compartments are soft, extremities are warm and well-perfused, no ecchymosis SKIN: Normal color for age and race; warm NEURO: Moves all extremities equally sensation to light touch intact diffusely, cranial nerves II through XII intact, normal speech, normal gait PSYCH: The patient's mood and manner are  appropriate. Grooming and personal hygiene are appropriate.  MEDICAL DECISION MAKING: Patient here with mechanical fall.  Will obtain CT of his head, cervical spine and x-ray of the left ribs.  No other sign of trauma on exam.  He states that he will take Tylenol for pain but does not want anything stronger.  Hemodynamically stable, neurologically intact.  ED PROGRESS: Patient's CT of his head and cervical spine show no acute abnormality.  He has a nondisplaced anterior right seventh rib fracture.  He declines any medication stronger than Tylenol.  He has been provided with incentive spirometer instructed to use this every 1-2 hours while awake as I feel he is safe for discharge home.  His daughter will pick him up.  Discussed return precautions.  He has a PCP for follow-up.   At this time, I do not feel there is any life-threatening condition present. I have reviewed and discussed all results (EKG, imaging, lab, urine as appropriate) and exam findings with patient/family. I have reviewed nursing notes and appropriate previous records.  I feel the patient is safe to be discharged home without further emergent workup and can continue workup as an outpatient as needed. Discussed usual and customary return precautions. Patient/family verbalize understanding and are comfortable with this plan.  Outpatient follow-up has been provided if needed. All questions have been answered.      Reighn Kaplan, Delice Bison, DO 11/20/17 1500

## 2017-11-20 NOTE — ED Triage Notes (Signed)
Pt c/o fall x 1 day ago on concrete from standing injury left side of head and left rib pain

## 2017-11-20 NOTE — ED Notes (Signed)
ED Provider at bedside. 

## 2017-11-20 NOTE — ED Notes (Signed)
Patient transported to X-ray 

## 2017-11-22 ENCOUNTER — Ambulatory Visit (INDEPENDENT_AMBULATORY_CARE_PROVIDER_SITE_OTHER): Payer: Medicare Other | Admitting: Family Medicine

## 2017-11-22 ENCOUNTER — Encounter: Payer: Self-pay | Admitting: Family Medicine

## 2017-11-22 VITALS — BP 141/75 | HR 78 | Temp 98.4°F | Resp 16 | Wt 235.0 lb

## 2017-11-22 DIAGNOSIS — S2232XD Fracture of one rib, left side, subsequent encounter for fracture with routine healing: Secondary | ICD-10-CM | POA: Diagnosis not present

## 2017-11-22 DIAGNOSIS — E119 Type 2 diabetes mellitus without complications: Secondary | ICD-10-CM | POA: Diagnosis not present

## 2017-11-22 LAB — HEMOGLOBIN A1C: Hgb A1c MFr Bld: 6.9 % — ABNORMAL HIGH (ref 4.6–6.5)

## 2017-11-22 NOTE — Progress Notes (Signed)
[place] at [facility] [address], [place], [place] [address] [phone]  Date:  11/22/2017   Name:  Matthew Nash   DOB:  [birth year]   MRN:  [id number]  PCP:  Darreld Mclean, MD    Chief Complaint: No chief complaint on file.   History of Present Illness:  Matthew Nash is a 81 y.o. very pleasant male patient who presents with the following:  He was in the ER on 12/10 after falling recently He was in his garage and tripped over the toe of his shoe.  He did not lose his balance, he tripped. He fell onto his left arm and this caused the cracked rib. He also got a black eye in the fall  He is using tylenol 3x a day- this is helping with his pain.   He is able to sleep ok He notes that trying to change position is really problematic - otherwise his pain is getting better No fever, no cough except to clear his throat  MEDICAL DECISION MAKING: Patient here with mechanical fall.  Will obtain CT of his head, cervical spine and x-ray of the left ribs.  No other sign of trauma on exam.  He states that he will take Tylenol for pain but does not want anything stronger.  Hemodynamically stable, neurologically intact.  ED PROGRESS: Patient's CT of his head and cervical spine show no acute abnormality.  He has a nondisplaced anterior right seventh rib fracture.  He declines any medication stronger than Tylenol.  He has been provided with incentive spirometer instructed to use this every 1-2 hours while awake as I feel he is safe for discharge home.  His daughter will pick him up.  Discussed return precautions.  He has a PCP for follow-up.  He does have a history of DM- would like to check his A1c today while he is here   Lab Results  Component Value Date   HGBA1C 6.6 (H) 08/31/2017    Patient Active Problem List   Diagnosis Date Noted  . Diarrhea 09/15/2017  . Incontinence of feces 09/15/2017  . Peripheral neuropathy 10/20/2016   . Bladder outlet obstruction 05/02/2016  . OA (osteoarthritis) of knee 05/27/2015  . Diabetes mellitus type II, controlled ([place]) 11/26/2014  . Gastroesophageal reflux disease without esophagitis 11/26/2014  . Arthritis of both knees 11/26/2014  . Absence of bladder continence 11/26/2014  . Hyperlipidemia LDL goal <100 11/26/2014  . Acute medial meniscal tear 07/16/2014  . Lumbosacral spondylosis without myelopathy 02/17/2014  . Spondylosis, lumbosacral 02/17/2014    Past Medical History:  Diagnosis Date  . Arthritis    both knees  . Back pain at L4-L5 level   . Basal cell carcinoma   . BPH (benign prostatic hyperplasia)    hx s/p turp  . Chronic back pain    spondylosis  . Chronic kidney disease    kidney stones 1979  . DDD (degenerative disc disease), lumbar   . Diabetes mellitus    takes Metformin and Victoza daily-under control  . Diarrhea    x 1 today  . Diverticulitis   . Gallstones   . GERD (gastroesophageal reflux disease)    takes Omeprazole daily-under control  . H/O hiatal hernia   . H/O measles   . Hepatitis B    B-with Mono and jaundice 1960  . History of chicken pox   . History of colon polyps   . History  of kidney stones   . Hyperlipidemia    takes Simvastatin and Tricor daily-under control  . Hypertension    takes Lisinopril daily-under control  . Joint pain    both knees  . Joint swelling    rt knee  . Kidney stones   . Melanoma ([place])    right hand;basal cell carcinoma  . Neuropathy   . Peripheral vascular disease ([place])    diabetic neuropathy  . Pneumonia    hx of  . Prostate infection    currently taking macrobid  . Sleep apnea with use of continuous positive airway pressure (CPAP)   . Tinnitus   . Urinary incontinence   . UTI (lower urinary tract infection)     Past Surgical History:  Procedure Laterality Date  . BASAL CELL CARCINOMA EXCISION    . BOTOX INJECTION  03/22/2017   In the bladder for nightly incontinence.  . C5 and  T1 bone chip removed   1986  . [place]  . CHOLECYSTECTOMY  1993  . COLONOSCOPY  2011  . cortisone injection    . EYE SURGERY     cataract sx 2017 bilateral  . I&D left knee Left 1958   states 22 times  . KNEE ARTHROSCOPY Right 07/16/2014   Procedure: RIGHT ARTHROSCOPY KNEE WITH Medial and Lateral DEBRIDEMENT and chondroplasty;  Surgeon: Gearlean Alf, MD;  Location: [facility];  Service: Orthopedics;  Laterality: Right;  . LUMBAR LAMINECTOMY/DECOMPRESSION MICRODISCECTOMY Right 02/17/2014   Procedure: RIGHT LUMBAR TWO-THREE LAMINECTOMY;  Surgeon: Charlie Pitter, MD;  Location: [facility];  Service: Neurosurgery;  Laterality: Right;  right   . right shoulder surgery  2012   rotator cuff repair  . SHOULDER ARTHROSCOPY W/ ROTATOR CUFF REPAIR Left NOV 2010  . TONSILLECTOMY  1942  . TOTAL KNEE ARTHROPLASTY Right 05/27/2015   Procedure: RIGHT TOTAL KNEE ARTHROPLASTY;  Surgeon: Gaynelle Arabian, MD;  Location: [facility];  Service: Orthopedics;  Laterality: Right;  . TOTAL KNEE ARTHROPLASTY Left 05/30/2016   Procedure: LEFT TOTAL KNEE ARTHROPLASTY;  Surgeon: Gaynelle Arabian, MD;  Location: [facility];  Service: Orthopedics;  Laterality: Left;  . TRANSURETHRAL RESECTION OF PROSTATE  2006  . VASECTOMY  1983  . WISDOM TOOTH EXTRACTION      Social History   Tobacco Use  . Smoking status: Former Smoker    Types: Pipe    Last attempt to quit: 07/14/1978    Years since quitting: 39.3  . Smokeless tobacco: Never Used  . Tobacco comment: quit smoking 1979  Substance Use Topics  . Alcohol use: Yes    Comment: OCCASIONAL  . Drug use: No    Family History  Problem Relation Age of Onset  . Heart disease Mother 96       Deceased  . Bladder Cancer Mother   . Breast cancer Mother   . Prostate cancer Father        Deceased-in 2s  . Healthy Brother   . Skin cancer Brother        "basal cell cancer in the bloodstream"  . Asthma Sister        Deceased  . Healthy Son        #1  . Healthy  Daughter        #2  . Obesity Son        #2    Allergies  Allergen Reactions  . Albamycin [Novobiocin] Other (See Comments)    Made me look like a  strawberry   . Penicillins Rash and Other (See Comments)    Has patient had a PCN reaction causing immediate rash, facial/tongue/throat swelling, SOB or lightheadedness with hypotension:  no Has patient had a PCN reaction causing severe rash involving mucus membranes or skin necrosis: no Has patient had a PCN reaction that required hospitalization no Has patient had a PCN reaction occurring within the last 10 years: no - childhood reaction If all of the above answers are "NO", then may proceed with Cephalosporin use.     Medication list has been reviewed and updated.  Current Outpatient Medications on File Prior to Visit  Medication Sig Dispense Refill  . aspirin EC 81 MG tablet Take 81 mg by mouth daily.    . calcium gluconate 1 g, magnesium sulfate 1 g in sodium chloride 0.9 % 100 mL Take 185 mg by mouth 2 (two) times daily after a meal.    . CALCIUM-MAGNESIUM-VITAMIN D PO Take 2 tablets by mouth 2 (two) times daily.     . celecoxib (CELEBREX) 200 MG capsule TAKE 1 CAPSULE TWICE A DAY 180 capsule 2  . cephALEXin (KEFLEX) 500 MG capsule Take 500 mg by mouth daily.    . cholestyramine (QUESTRAN) 4 g packet Take 1 packet (4 g total) by mouth 2 (two) times daily. 60 each 2  . clonazePAM (KLONOPIN) 0.5 MG tablet Take 1 tablet (0.5 mg total) at bedtime as needed by mouth for anxiety. 30 tablet 0  . FIBER PO Take 2 capsules by mouth 2 (two) times daily.     [name] Kitchen glucose blood (FREESTYLE LITE) test strip Use as instructed to test Blood Glucose twice daily Dx: E11.9 (Patient not taking: Reported on 09/20/2017) 200 each 12  . Lancets (FREESTYLE) lancets USE 1 LANCET TWICE A DAY AS INSTRUCTED (Patient not taking: Reported on 09/20/2017) 200 each 3  . lisinopril (PRINIVIL,ZESTRIL) 5 MG tablet Take 5 mg by mouth daily.    . Melatonin 10 MG TABS Take 1  tablet by mouth at bedtime.    . metFORMIN (GLUCOPHAGE) 500 MG tablet Take 1 tablet (500 mg total) by mouth 2 (two) times daily with a meal. (Patient not taking: Reported on 09/20/2017) 180 tablet 3  . Multiple Vitamin (MULTI-VITAMINS) TABS Take 1 g by mouth daily.    [name] Kitchen omeprazole (PRILOSEC) 20 MG capsule TAKE 1 CAPSULE DAILY (Patient taking differently: TAKE 1 CAPSULE BY MOUTH TWICE DAILY) 90 capsule 3  . Probiotic Product (PROBIOTIC ADVANCED PO) Take by mouth.    . simvastatin (ZOCOR) 10 MG tablet Take 1 tablet (10 mg total) by mouth at bedtime. 90 tablet 2  . TRICOR 145 MG tablet TAKE 1 TABLET EVERY EVENING 90 tablet 3  . vitamin C (ASCORBIC ACID) 500 MG tablet Take 500 mg by mouth daily.     No current facility-administered medications on file prior to visit.     Review of Systems:  As per HPI- otherwise negative. He admits to taking shallow breaths due to pain, but is also using his spirometer on a regular basis    Physical Examination: Vitals:   11/22/17 1318  BP: (!) 141/75  Pulse: 78  Resp: 16  Temp: 98.4 F (36.9 C)  SpO2: 96%   Vitals:   11/22/17 1318  Weight: 235 lb (106.6 kg)   Body mass index is 33.72 kg/m. Ideal Body Weight:    GEN: WDWN, NAD, Non-toxic, A & O x 3, obese, looks well HEENT: Atraumatic, Normocephalic. Neck supple. No masses, No  LAD.   Ears and Nose: No external deformity. CV: RRR, No M/G/R. No JVD. No thrill. No extra heart sounds. PULM: CTA B, no wheezes, crackles, rhonchi. No retractions. No resp. distress. No accessory muscle use. He is tender over the fractured rib - left 7th.  ABD: S, NT, ND, +BS. No rebound. No HSM. EXTR: No c/c/e NEURO Normal gait.  PSYCH: Normally interactive. Conversant. Not depressed or anxious appearing.  Calm demeanor.    Assessment and Plan: Controlled type 2 diabetes mellitus without complication, without long-term current use of insulin (HCC) - Plan: Hemoglobin A1c  Closed fracture of one rib of left side  with routine healing, subsequent encounter  Repeat A1c today to monitor his DM Recent left rib fracture Seems to be healing as expected.  He will let me know if not continuing to improve Plan to visit in about 4 months for a repeat A1c  Signed Lamar Blinks, MD

## 2017-11-22 NOTE — Patient Instructions (Signed)
It was good to see you today- take care and I will get your A1c today  I'll be in touch with your results asap Sorry about your broken rib-continue to use your spirometer and let me know if you do not progressively improve

## 2017-11-28 DIAGNOSIS — Z08 Encounter for follow-up examination after completed treatment for malignant neoplasm: Secondary | ICD-10-CM | POA: Diagnosis not present

## 2017-11-28 DIAGNOSIS — Z85828 Personal history of other malignant neoplasm of skin: Secondary | ICD-10-CM | POA: Diagnosis not present

## 2017-11-28 DIAGNOSIS — L814 Other melanin hyperpigmentation: Secondary | ICD-10-CM | POA: Diagnosis not present

## 2017-11-28 DIAGNOSIS — L821 Other seborrheic keratosis: Secondary | ICD-10-CM | POA: Diagnosis not present

## 2017-12-18 ENCOUNTER — Ambulatory Visit: Payer: Medicare Other | Admitting: Neurology

## 2017-12-22 ENCOUNTER — Institutional Professional Consult (permissible substitution): Payer: Medicare Other | Admitting: Pulmonary Disease

## 2018-01-04 ENCOUNTER — Encounter: Payer: Self-pay | Admitting: Family Medicine

## 2018-01-10 ENCOUNTER — Encounter: Payer: Self-pay | Admitting: Family Medicine

## 2018-01-10 ENCOUNTER — Ambulatory Visit (INDEPENDENT_AMBULATORY_CARE_PROVIDER_SITE_OTHER): Payer: Medicare Other | Admitting: Family Medicine

## 2018-01-10 VITALS — BP 140/76 | HR 93 | Temp 97.7°F | Resp 16 | Ht 70.5 in | Wt 237.4 lb

## 2018-01-10 DIAGNOSIS — Z789 Other specified health status: Secondary | ICD-10-CM

## 2018-01-10 DIAGNOSIS — R2681 Unsteadiness on feet: Secondary | ICD-10-CM | POA: Diagnosis not present

## 2018-01-10 DIAGNOSIS — I10 Essential (primary) hypertension: Secondary | ICD-10-CM | POA: Diagnosis not present

## 2018-01-10 DIAGNOSIS — E1169 Type 2 diabetes mellitus with other specified complication: Secondary | ICD-10-CM

## 2018-01-10 DIAGNOSIS — E119 Type 2 diabetes mellitus without complications: Secondary | ICD-10-CM | POA: Diagnosis not present

## 2018-01-10 DIAGNOSIS — G4769 Other sleep related movement disorders: Secondary | ICD-10-CM | POA: Diagnosis not present

## 2018-01-10 DIAGNOSIS — E785 Hyperlipidemia, unspecified: Secondary | ICD-10-CM | POA: Diagnosis not present

## 2018-01-10 DIAGNOSIS — G63 Polyneuropathy in diseases classified elsewhere: Secondary | ICD-10-CM

## 2018-01-10 DIAGNOSIS — Z23 Encounter for immunization: Secondary | ICD-10-CM

## 2018-01-10 NOTE — Patient Instructions (Addendum)
I will set you up for a second neurology opinion with [facility] You got your last pneumonia shot today Let me know if you need anything !

## 2018-01-10 NOTE — Progress Notes (Signed)
[place] at [facility] [address] [place], [place] [address] [phone]  Date:  01/10/2018   Name:  Matthew Nash   DOB:  June 21, 1936   MRN:  [id number]  PCP:  Darreld Mclean, MD    Chief Complaint: Follow-up   History of Present Illness:  Matthew Nash is a 82 y.o. very pleasant male patient who presents with the following:  Here today for a follow-up visit Diabetes controlled with metformin- 500 BID Hyperlipidemia Controlled HTN He had written to me recently with a few concerns, as follows:  I have an appointment of 1/30. Please review my records for complaints for maintaining balance, change for speech, risk of falling, problem with handwriting, acting out dreams ( hitting wife during sleep) .  One area we haven't discussed is constipation. Normally I don't have those episodes where I can't control my bowel movements. In these cases I am constipated and will go a day and a half to two days without the indication I need to have a bowel movement. Sometime it can take 10 to fifteen minutes of hard bearing down to even start any movement.  The stool is very solid upon discharge.  Alone with this is I can no longer empty my bladder unless I use a catheter. (appointment with my Dr Venia Minks forthcoming.) This is what I need to discuss with you on the 30th.   Note from his urologist- Venia Minks- from November: Matthew Nash is a 82 y.o. year old male who presents for evaluation of neurogenic bladder and urinary incontinence. The patient does CIC. He underwent Botox injection which significantly improved his urinary incontinence. Recently he has noticed some increased urinary incontinence without awareness into his diaper especially first thing in the morning. He has been doing CIC only twice per day. He thinks that he is going back towards normal now. He had some Mohs facial skin surgery recently at about the time of his  increased incontinence. No dysuria hematuria or bladder pain.  He is seen at [facility] for his bowel issues He is also a neurology pt for his OSA and also his gait issues- from most recent note, May 18:  In summary, Matthew Nash is a very pleasant 82 y.o.-year old male with an underlyingcomplexmedical history of osteoarthritis, type 2 diabetes, reflux disease, hyperlipidemia, lumbosacral spondylosis with low back pain, diverticulosis, kidney stones, hypertension, peripheral neuropathy, foot drop with bilateral AFO,Obesity, recent diagnosis of moderate obstructive sleep apnea, PLMS, parasomnias including report of cream enactments, who presents for evaluation of his gait and balance problems and history of falls. On examination, he has a nonspecific gait disorder, no significant focality on neurological exam, evidence of peripheral neuropathy which is not new. He most likely has a multifactorial gait disorder secondary to aging, lumbar spine degenerative disease, history of arthritis with bilateral knee replacements, history of foot drop, bilateral AFOs, peripheral neuropathy and also suboptimal hydration. He admits that he does not drink more than 3 cups of water per day typically. He does like to drink coffee and sometimes tea or soda. He does not drink alcohol on a daily basis, in fact, drinks alcohol infrequently. Unfortunately, no specific treatment is available and additional diagnostic testing is really not warranted at this time, he has had a recent brain MRI in September 2017 which I reviewed and in the past he had EMG and nerve conduction testing which confirmed neuropathy. I would rather not  put him through a uncomfortable or even painful test which may not change his management. He is advised to use his walking stick for gait safety. He has tried physical therapy and had evaluation with ENT also. He is advised to consider using his walker also. Been better hydrated may help as well. We talked  about gait safety and fall risk quite a bit today. He is commended for his CPAP adherence. I would like for him to come back for a mask refit a sleep lab and also we will try him on AutoPap therapy for the next 30 days. He is highly commended for his treatment adherence despite his difficulties and reservations in the past. I would like to see him back for his scheduled appointment later in June for his sleep apnea follow-up. I answered all his questions today and he was in agreement.  He needs a foot exam and pneumovax booster today  Lab Results  Component Value Date   HGBA1C 6.9 (H) 11/22/2017   Pt notes that he seems to be acting out his dreams.  He was hitting his bedside table recently and woke himself up due to pain in his hand He is known to have OSA  Last fall was in December- he has not fallen since then but has come close per his report  He feels like his diarrhea is exacerbated by dairy products- using lactaid helps  He is having a lot of trouble with acting out his dreams, this is one of his main concerns- his wife reports that his happens a lot He is not sleepwalking  He is using his CPAP but this is not at all comfortable for him- he is seeing Dr. Elsworth Soho to discuss this soone He feels like his voice is not as strong as it was in the past He has bilateral foot drop, worse on the right. He wears AFOs Matthew Nash is really worried that he has some sort of neurologic disorder to tie all of this together- he would like to seek another neurology opinion to see if he can find out more No family history of neurologic disorder  Patient Active Problem List   Diagnosis Date Noted  . Diarrhea 09/15/2017  . Incontinence of feces 09/15/2017  . Peripheral neuropathy 10/20/2016  . Bladder outlet obstruction 05/02/2016  . OA (osteoarthritis) of knee 05/27/2015  . Diabetes mellitus type II, controlled ([place]) 11/26/2014  . Gastroesophageal reflux disease without esophagitis 11/26/2014  . Arthritis  of both knees 11/26/2014  . Absence of bladder continence 11/26/2014  . Hyperlipidemia LDL goal <100 11/26/2014  . Acute medial meniscal tear 07/16/2014  . Lumbosacral spondylosis without myelopathy 02/17/2014  . Spondylosis, lumbosacral 02/17/2014    Past Medical History:  Diagnosis Date  . Arthritis    both knees  . Back pain at L4-L5 level   . Basal cell carcinoma   . BPH (benign prostatic hyperplasia)    hx s/p turp  . Chronic back pain    spondylosis  . Chronic kidney disease    kidney stones 1979  . DDD (degenerative disc disease), lumbar   . Diabetes mellitus    takes Metformin and Victoza daily-under control  . Diarrhea    x 1 today  . Diverticulitis   . Gallstones   . GERD (gastroesophageal reflux disease)    takes Omeprazole daily-under control  . H/O hiatal hernia   . H/O measles   . Hepatitis B    B-with Mono and jaundice 1960  . History  of chicken pox   . History of colon polyps   . History of kidney stones   . Hyperlipidemia    takes Simvastatin and Tricor daily-under control  . Hypertension    takes Lisinopril daily-under control  . Joint pain    both knees  . Joint swelling    rt knee  . Kidney stones   . Melanoma ([place])    right hand;basal cell carcinoma  . Neuropathy   . Peripheral vascular disease ([place])    diabetic neuropathy  . Pneumonia    hx of  . Prostate infection    currently taking macrobid  . Sleep apnea with use of continuous positive airway pressure (CPAP)   . Tinnitus   . Urinary incontinence   . UTI (lower urinary tract infection)     Past Surgical History:  Procedure Laterality Date  . BASAL CELL CARCINOMA EXCISION    . BOTOX INJECTION  03/22/2017   In the bladder for nightly incontinence.  . C5 and T1 bone chip removed   1986  . [place]  . CHOLECYSTECTOMY  1993  . COLONOSCOPY  2011  . cortisone injection    . EYE SURGERY     cataract sx 2017 bilateral  . I&D left knee Left 1958   states 22  times  . KNEE ARTHROSCOPY Right 07/16/2014   Procedure: RIGHT ARTHROSCOPY KNEE WITH Medial and Lateral DEBRIDEMENT and chondroplasty;  Surgeon: Gearlean Alf, MD;  [facility]: [facility];  Service: Orthopedics;  Laterality: Right;  . LUMBAR LAMINECTOMY/DECOMPRESSION MICRODISCECTOMY Right 02/17/2014   Procedure: RIGHT LUMBAR TWO-THREE LAMINECTOMY;  Surgeon: Charlie Pitter, MD;  Location: [facility];  Service: Neurosurgery;  Laterality: Right;  right   . right shoulder surgery  2012   rotator cuff repair  . SHOULDER ARTHROSCOPY W/ ROTATOR CUFF REPAIR Left NOV 2010  . TONSILLECTOMY  1942  . TOTAL KNEE ARTHROPLASTY Right 05/27/2015   Procedure: RIGHT TOTAL KNEE ARTHROPLASTY;  Surgeon: Gaynelle Arabian, MD;  [facility];  Service: Orthopedics;  Laterality: Right;  . TOTAL KNEE ARTHROPLASTY Left 05/30/2016   Procedure: LEFT TOTAL KNEE ARTHROPLASTY;  Surgeon: Gaynelle Arabian, MD;  [facility];  Service: Orthopedics;  Laterality: Left;  . TRANSURETHRAL RESECTION OF PROSTATE  2006  . VASECTOMY  1983  . WISDOM TOOTH EXTRACTION      Social History   Tobacco Use  . Smoking status: Former Smoker    Types: Pipe    Last attempt to quit: 07/14/1978    Years since quitting: 39.5  . Smokeless tobacco: Never Used  . Tobacco comment: quit smoking 1979  Substance Use Topics  . Alcohol use: Yes    Comment: OCCASIONAL  . Drug use: No    Family History  Problem Relation Age of Onset  . Heart disease Mother 61       Deceased  . Bladder Cancer Mother   . Breast cancer Mother   . Prostate cancer Father        Deceased-in 50s  . Healthy Brother   . Skin cancer Brother        "basal cell cancer in the bloodstream"  . Asthma Sister        Deceased  . Healthy Son        #1  . Healthy Daughter        #2  . Obesity Son        #2    Allergies  Allergen Reactions  .  Albamycin [Novobiocin] Other (See Comments)    Made me look like a strawberry   . Penicillins Rash and Other (See Comments)    Has  patient had a PCN reaction causing immediate rash, facial/tongue/throat swelling, SOB or lightheadedness with hypotension:  no Has patient had a PCN reaction causing severe rash involving mucus membranes or skin necrosis: no Has patient had a PCN reaction that required hospitalization no Has patient had a PCN reaction occurring within the last 10 years: no - childhood reaction If all of the above answers are "NO", then may proceed with Cephalosporin use.     Medication list has been reviewed and updated.  Current Outpatient Medications on File Prior to Visit  Medication Sig Dispense Refill  . aspirin EC 81 MG tablet Take 81 mg by mouth daily.    . calcium gluconate 1 g, magnesium sulfate 1 g in sodium chloride 0.9 % 100 mL Take 185 mg by mouth 2 (two) times daily after a meal.    . CALCIUM-MAGNESIUM-VITAMIN D PO Take 2 tablets by mouth 2 (two) times daily.     . celecoxib (CELEBREX) 200 MG capsule TAKE 1 CAPSULE TWICE A DAY 180 capsule 2  . cephALEXin (KEFLEX) 500 MG capsule Take 500 mg by mouth daily.    [name] Kitchen FIBER PO Take 2 capsules by mouth 2 (two) times daily.     [name] Kitchen glucose blood (FREESTYLE LITE) test strip Use as instructed to test Blood Glucose twice daily Dx: E11.9 200 each 12  . Lancets (FREESTYLE) lancets USE 1 LANCET TWICE A DAY AS INSTRUCTED 200 each 3  . lisinopril (PRINIVIL,ZESTRIL) 5 MG tablet Take 5 mg by mouth daily.    . Melatonin 10 MG TABS Take 1 tablet by mouth at bedtime.    . metFORMIN (GLUCOPHAGE) 500 MG tablet Take 1 tablet (500 mg total) by mouth 2 (two) times daily with a meal. 180 tablet 3  . Multiple Vitamin (MULTI-VITAMINS) TABS Take 1 g by mouth daily.    [name] Kitchen omeprazole (PRILOSEC) 20 MG capsule TAKE 1 CAPSULE DAILY (Patient taking differently: TAKE 1 CAPSULE BY MOUTH TWICE DAILY) 90 capsule 3  . Probiotic Product (PROBIOTIC ADVANCED PO) Take by mouth.    . simvastatin (ZOCOR) 10 MG tablet Take 1 tablet (10 mg total) by mouth at bedtime. 90 tablet 2  . TRICOR 145 MG  tablet TAKE 1 TABLET EVERY EVENING 90 tablet 3  . vitamin C (ASCORBIC ACID) 500 MG tablet Take 500 mg by mouth daily.     No current facility-administered medications on file prior to visit.     Review of Systems:  As per HPI- otherwise negative. No fever or chills No CP or SOB No rash   Physical Examination: Vitals:   01/10/18 1518  BP: 140/76  Pulse: 93  Resp: 16  Temp: 97.7 F (36.5 C)  SpO2: 96%   Vitals:   01/10/18 1518  Weight: 237 lb 6.4 oz (107.7 kg)  Height: 5' 10.5" (1.791 m)   Body mass index is 33.58 kg/m. Ideal Body Weight: Weight in (lb) to have BMI = 25: 176.4  GEN: WDWN, NAD, Non-toxic, A & O x 3, overweight, looks well  HEENT: Atraumatic, Normocephalic. Neck supple. No masses, No LAD. Ears and Nose: No external deformity. CV: RRR, No M/G/R. No JVD. No thrill. No extra heart sounds. PULM: CTA B, no wheezes, crackles, rhonchi. No retractions. No resp. distress. No accessory muscle use. ABD: S, NT, ND, +BS. No rebound. No HSM. EXTR:  No c/c/e NEURO Normal gait.  PSYCH: Normally interactive. Conversant. Not depressed or anxious appearing.  Calm demeanor.  Foot exam: normal pulses.  He is able to feel only 1 or 2 filament touches per foot.   He wears bilateral AFOs Ankle flexion is weak bilaterally. Worse on the right  Assessment and Plan: Controlled type 2 diabetes mellitus without complication, without long-term current use of insulin (HCC)  Polyneuropathy associated with underlying disease ([place]) - Plan: Ambulatory referral to Neurology  Intermittent self-catheterization of bladder  Sleep-related movement disorder - Plan: Ambulatory referral to Neurology  Immunization due - Plan: Pneumococcal polysaccharide vaccine 23-valent greater than or equal to 2yo subcutaneous/IM  Hyperlipidemia associated with type 2 diabetes mellitus ([place])  Gait instability - Plan: Ambulatory referral to Neurology  Essential hypertension  Updated his pneumovax  today Well controlled DM, recent A1c  Foot exam done Referral to neurology for a 2nd opinion  Signed Lamar Blinks, MD

## 2018-01-11 ENCOUNTER — Telehealth: Payer: Self-pay

## 2018-01-11 NOTE — Telephone Encounter (Signed)
Copied from [place]. Topic: Referral - Question >> Jan 11, 2018  9:01 AM Scherrie Gerlach wrote: Reason for CRM: Kennyth Lose with [facility], Dr Norwood Levo calling to ask about this referral to them. They would like to know exactly what the pt is being referred for. They have several things listed, but is confused about the entire referral Would like to know is this a 2nd opinion?  Also did not received demo information.

## 2018-01-11 NOTE — Telephone Encounter (Signed)
Called them back and clarified,they will see pt

## 2018-01-12 ENCOUNTER — Encounter: Payer: Self-pay | Admitting: Pulmonary Disease

## 2018-01-12 ENCOUNTER — Institutional Professional Consult (permissible substitution): Payer: Medicare Other | Admitting: Pulmonary Disease

## 2018-01-12 ENCOUNTER — Ambulatory Visit (INDEPENDENT_AMBULATORY_CARE_PROVIDER_SITE_OTHER): Payer: Medicare Other | Admitting: Pulmonary Disease

## 2018-01-12 DIAGNOSIS — G4733 Obstructive sleep apnea (adult) (pediatric): Secondary | ICD-10-CM

## 2018-01-12 DIAGNOSIS — Z9989 Dependence on other enabling machines and devices: Secondary | ICD-10-CM | POA: Diagnosis not present

## 2018-01-12 DIAGNOSIS — G4752 REM sleep behavior disorder: Secondary | ICD-10-CM | POA: Diagnosis not present

## 2018-01-12 NOTE — Assessment & Plan Note (Signed)
We will obtain download report on your machine and make adjustments to pressure as needed. We discussed new full facemask -air fit F 30 or Respironics dream wear  If he is unable to tolerate full facemask and we may switch to nasal mask with chinstrap   Weight loss encouraged, compliance with goal of at least 4-6 hrs every night is the expectation. Advised against medications with sedative side effects Cautioned against driving when sleepy - understanding that sleepiness will vary on a day to day basis

## 2018-01-12 NOTE — Patient Instructions (Signed)
We will obtain download report on your machine and make adjustments to pressure as needed. We discussed new full facemask -air fit F 30 or Respironics dream wear
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
## 2018-01-12 NOTE — Assessment & Plan Note (Signed)
Absence of increased muscle tone during REM sleep makes it less likely that he has REM behavior disorder.  He does have periodic limb movements but does not seem to have significant arousals or restless leg syndrome

## 2018-01-17 ENCOUNTER — Telehealth: Payer: Self-pay | Admitting: Pulmonary Disease

## 2018-01-17 DIAGNOSIS — G4733 Obstructive sleep apnea (adult) (pediatric): Secondary | ICD-10-CM

## 2018-01-17 NOTE — Telephone Encounter (Signed)
Ed download - AHI 8/h on CPAP 11 cm Increase to 12 cm & recheck DL in 4 wks

## 2018-01-19 ENCOUNTER — Other Ambulatory Visit: Payer: Self-pay | Admitting: Family Medicine

## 2018-01-19 DIAGNOSIS — E119 Type 2 diabetes mellitus without complications: Secondary | ICD-10-CM

## 2018-01-19 NOTE — Telephone Encounter (Signed)
Spoke with patient. He is aware of the pressure change. Patient also stated that he has been scheduled for a mask fit next week for the F20.   Patient is aware that I will place the order for [facility]. Nothing else needed at time of call.

## 2018-01-20 ENCOUNTER — Encounter: Payer: Self-pay | Admitting: Pulmonary Disease

## 2018-01-22 DIAGNOSIS — H5203 Hypermetropia, bilateral: Secondary | ICD-10-CM | POA: Diagnosis not present

## 2018-01-22 DIAGNOSIS — H52223 Regular astigmatism, bilateral: Secondary | ICD-10-CM | POA: Diagnosis not present

## 2018-01-22 DIAGNOSIS — H5022 Vertical strabismus, left eye: Secondary | ICD-10-CM | POA: Diagnosis not present

## 2018-01-29 ENCOUNTER — Encounter: Payer: Self-pay | Admitting: Family Medicine

## 2018-01-30 DIAGNOSIS — R32 Unspecified urinary incontinence: Secondary | ICD-10-CM | POA: Diagnosis not present

## 2018-01-30 DIAGNOSIS — N319 Neuromuscular dysfunction of bladder, unspecified: Secondary | ICD-10-CM | POA: Diagnosis not present

## 2018-02-12 DIAGNOSIS — E1142 Type 2 diabetes mellitus with diabetic polyneuropathy: Secondary | ICD-10-CM | POA: Diagnosis not present

## 2018-02-12 DIAGNOSIS — G4752 REM sleep behavior disorder: Secondary | ICD-10-CM | POA: Diagnosis not present

## 2018-03-14 ENCOUNTER — Other Ambulatory Visit: Payer: Self-pay | Admitting: Family Medicine

## 2018-03-14 DIAGNOSIS — E119 Type 2 diabetes mellitus without complications: Secondary | ICD-10-CM

## 2018-03-20 DIAGNOSIS — E119 Type 2 diabetes mellitus without complications: Secondary | ICD-10-CM | POA: Diagnosis not present

## 2018-04-04 ENCOUNTER — Ambulatory Visit (INDEPENDENT_AMBULATORY_CARE_PROVIDER_SITE_OTHER): Payer: Medicare Other | Admitting: Gastroenterology

## 2018-04-04 ENCOUNTER — Encounter: Payer: Self-pay | Admitting: Gastroenterology

## 2018-04-04 ENCOUNTER — Encounter: Payer: Self-pay | Admitting: Family Medicine

## 2018-04-04 VITALS — BP 114/68 | HR 72 | Ht 69.0 in | Wt 224.1 lb

## 2018-04-04 DIAGNOSIS — M6289 Other specified disorders of muscle: Secondary | ICD-10-CM

## 2018-04-04 DIAGNOSIS — R194 Change in bowel habit: Secondary | ICD-10-CM | POA: Diagnosis not present

## 2018-04-04 NOTE — Progress Notes (Signed)
Assessment and plans reviewed  

## 2018-04-04 NOTE — Progress Notes (Signed)
04/04/2018 Matthew Nash [id number] October 27, 1936   HISTORY OF PRESENT ILLNESS:  This is a 82 year old male who is a patient of Dr. Blanch Media.  He was seen here in October 2018 with complaints of diarrhea and fecal incontinence.  He had colonoscopy where he had severe diverticulosis throughout his entire colon, internal hemorrhoids, one 6 mm polyp that was removed and was a tubular adenoma, and absent rectal tone was noted.  Random biopsies throughout the colon to rule out microscopic colitis were negative.  No colonoscopy recall due to age.  He presents to our office today with complaints of difficulty having a bowel movement.  He says that a lot of times it takes about 15 minutes to get his bowels to move and he has to bear down very hard and bend over and then passes a large stool.  Once they do get moving and he will have several softer bowel movements after that.  He has had a lot of issues with urinary incontinence, etc. as well.  He did pelvic floor physical therapy for that, but he says that it was pretty much useless.   Past Medical History:  Diagnosis Date  . Arthritis    both knees  . Back pain at L4-L5 level   . Basal cell carcinoma   . BPH (benign prostatic hyperplasia)    hx s/p turp  . Chronic back pain    spondylosis  . Chronic kidney disease    kidney stones 1979  . DDD (degenerative disc disease), lumbar   . Diabetes mellitus    takes Metformin and Victoza daily-under control  . Diarrhea    x 1 today  . Diverticulitis   . Gallstones   . GERD (gastroesophageal reflux disease)    takes Omeprazole daily-under control  . H/O hiatal hernia   . H/O measles   . Hepatitis B    B-with Mono and jaundice 1960  . History of chicken pox   . History of colon polyps   . History of kidney stones   . Hyperlipidemia    takes Simvastatin and Tricor daily-under control  . Hypertension    takes Lisinopril daily-under control  . Joint pain    both knees  . Joint swelling    rt knee  . Kidney stones   . Melanoma ([place])    right hand;basal cell carcinoma  . Neuropathy   . Peripheral vascular disease ([place])    diabetic neuropathy  . Pneumonia    hx of  . Prostate infection    currently taking macrobid  . Sleep apnea with use of continuous positive airway pressure (CPAP)   . Tinnitus   . Urinary incontinence   . UTI (lower urinary tract infection)    Past Surgical History:  Procedure Laterality Date  . BASAL CELL CARCINOMA EXCISION    . BOTOX INJECTION  03/22/2017   In the bladder for nightly incontinence.  . C5 and T1 bone chip removed   1986  . [place]  . CHOLECYSTECTOMY  1993  . COLONOSCOPY  2011  . cortisone injection    . EYE SURGERY     cataract sx 2017 bilateral  . I&D left knee Left 1958   states 22 times  . KNEE ARTHROSCOPY Right 07/16/2014   Procedure: RIGHT ARTHROSCOPY KNEE WITH Medial and Lateral DEBRIDEMENT and chondroplasty;  Surgeon: Gearlean Alf, MD;  [facility]: [facility];  Service: Orthopedics;  Laterality: Right;  . LUMBAR  LAMINECTOMY/DECOMPRESSION MICRODISCECTOMY Right 02/17/2014   Procedure: RIGHT LUMBAR TWO-THREE LAMINECTOMY;  Surgeon: Charlie Pitter, MD;  Location: [place] [facility];  Service: Neurosurgery;  Laterality: Right;  right   . right shoulder surgery  2012   rotator cuff repair  . SHOULDER ARTHROSCOPY W/ ROTATOR CUFF REPAIR Left NOV 2010  . TONSILLECTOMY  1942  . TOTAL KNEE ARTHROPLASTY Right 05/27/2015   Procedure: RIGHT TOTAL KNEE ARTHROPLASTY;  Surgeon: Gaynelle Arabian, MD;  [facility]: [facility];  Service: Orthopedics;  Laterality: Right;  . TOTAL KNEE ARTHROPLASTY Left 05/30/2016   Procedure: LEFT TOTAL KNEE ARTHROPLASTY;  Surgeon: Gaynelle Arabian, MD;  Location: [facility];  Service: Orthopedics;  Laterality: Left;  . TRANSURETHRAL RESECTION OF PROSTATE  2006  . VASECTOMY  1983  . WISDOM TOOTH EXTRACTION      reports that he quit smoking about 39 years ago. His smoking use included pipe. He has never used smokeless  tobacco. He reports that he drinks alcohol. He reports that he does not use drugs. family history includes Asthma in his sister; Bladder Cancer in his mother; Breast cancer in his mother; Healthy in his brother, daughter, and son; Heart disease (age of onset: 60) in his mother; Obesity in his son; Prostate cancer in his father; Skin cancer in his brother. Allergies  Allergen Reactions  . Albamycin [Novobiocin] Other (See Comments)    Made me look like a strawberry   . Penicillins Rash and Other (See Comments)    Has patient had a PCN reaction causing immediate rash, facial/tongue/throat swelling, SOB or lightheadedness with hypotension:  no Has patient had a PCN reaction causing severe rash involving mucus membranes or skin necrosis: no Has patient had a PCN reaction that required hospitalization no Has patient had a PCN reaction occurring within the last 10 years: no - childhood reaction If all of the above answers are "NO", then may proceed with Cephalosporin use.       Outpatient Encounter Medications as of 04/04/2018  Medication Sig  . aspirin EC 81 MG tablet Take 81 mg by mouth daily.  [name] Kitchen CALCIUM-MAGNESIUM-VITAMIN D PO Take 2 tablets by mouth 2 (two) times daily.   . cephALEXin (KEFLEX) 500 MG capsule Take 500 mg by mouth daily.  [name] Kitchen FIBER PO Take 2 capsules by mouth 2 (two) times daily.   [name] Kitchen glucose blood (FREESTYLE LITE) test strip USE AS INSTRUCTED TO TEST BLOOD GLUCOSE TWICE A DAY  . Lancets (FREESTYLE) lancets USE 1 LANCET TWICE A DAY AS INSTRUCTED  . lisinopril (PRINIVIL,ZESTRIL) 5 MG tablet TAKE 1 TABLET EVERY EVENING  . Melatonin 10 MG TABS Take 1 tablet by mouth at bedtime.  . metFORMIN (GLUCOPHAGE) 500 MG tablet Take 1 tablet (500 mg total) by mouth 2 (two) times daily with a meal.  . Multiple Vitamin (MULTI-VITAMINS) TABS Take 1 g by mouth daily.  [name] Kitchen omeprazole (PRILOSEC) 20 MG capsule TAKE 1 CAPSULE DAILY (Patient taking differently: TAKE 1 CAPSULE BY MOUTH TWICE DAILY)  .  Probiotic Product (PROBIOTIC ADVANCED PO) Take by mouth.  . simvastatin (ZOCOR) 10 MG tablet Take 1 tablet (10 mg total) by mouth at bedtime.  . TRICOR 145 MG tablet TAKE 1 TABLET EVERY EVENING  . vitamin C (ASCORBIC ACID) 500 MG tablet Take 500 mg by mouth daily.  . [DISCONTINUED] calcium gluconate 1 g, magnesium sulfate 1 g in sodium chloride 0.9 % 100 mL Take 185 mg by mouth 2 (two) times daily after a meal.  . [DISCONTINUED] celecoxib (CELEBREX) 200 MG  capsule TAKE 1 CAPSULE TWICE A DAY  . [DISCONTINUED] lisinopril (PRINIVIL,ZESTRIL) 5 MG tablet Take 5 mg by mouth daily.   No facility-administered encounter medications on file as of 04/04/2018.      REVIEW OF SYSTEMS  : All other systems reviewed and negative except where noted in the History of Present Illness.   PHYSICAL EXAM: BP 114/68   Pulse 72 Comment: irregular  Ht 5\' 9"  (1.753 m)   Wt 224 lb 2 oz (101.7 kg)   BMI 33.10 kg/m  General: Well developed white male in no acute distress Head: Normocephalic and atraumatic Eyes:  Sclerae anicteric, conjunctiva pink. Ears: Normal auditory acuity Lungs: Clear throughout to auscultation; no increased WOB. Heart: Regular rate and rhythm; no M/R/G. Abdomen: Soft, non-distended.  BS present.  Non-tender. Musculoskeletal: Symmetrical with no gross deformities  Skin: No lesions on visible extremities Extremities: No edema  Neurological: Alert oriented x 4, grossly non-focal Psychological:  Alert and cooperative. Normal mood and affect  ASSESSMENT AND PLAN: *82 year old male who previously had complaints of diarrhea and fecal incontinence who has essentially no rectal tone.  Now complaining of difficulty getting stool out and hard stool initially followed by several softer stools.  I think that he likely has pelvic floor dysfunction as well.  We discussed this and he will consider pelvic floor PT.  I think that we are going to be walking a thin line with getting him to move his bowels  well.  He will discontinue his fiber capsules and try powder Benefiber or Citrucel daily along with a dose of Miralax daily.   CC:  Copland, Gay Filler, MD

## 2018-04-04 NOTE — Patient Instructions (Signed)
Consider pelvic floor physical therapy.   Try a daily powder fiber such as benefiber or citrucel.   Use miralax daily.

## 2018-04-10 ENCOUNTER — Encounter: Payer: Self-pay | Admitting: Physical Therapy

## 2018-04-10 ENCOUNTER — Encounter: Payer: Self-pay | Admitting: Gastroenterology

## 2018-04-10 NOTE — Progress Notes (Addendum)
Subjective:   Matthew Nash is a 82 y.o. male who presents for Medicare Annual/Subsequent preventive examination.  Review of Systems: No ROS.  Medicare Wellness Visit. Additional risk factors are reflected in the social history.  Cardiac Risk Factors include: advanced age (>76men, >4 women);diabetes mellitus;dyslipidemia;male gender;obesity (BMI >30kg/m2);sedentary lifestyle Sleep patterns:  Sleep 6-8 hrs per night. Takes Melatonin and Klonopin. Home Safety/Smoke Alarms: Feels safe in home. Smoke alarms in place.  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   Male:   CCS-  No longer doing routine screening due to age.    PSA-  Lab Results  Component Value Date   PSA 1.36 02/15/2016       Objective:    Vitals: BP 136/64 (BP Location: Left Arm, Patient Position: Sitting, Cuff Size: Normal)   Pulse 69   Ht 5\' 11"  (1.803 m)   Wt 233 lb 12.8 oz (106.1 kg)   SpO2 95%   BMI 32.61 kg/m   Body mass index is 32.61 kg/m.  Advanced Directives 09/20/2017 06/21/2017 04/10/2017 06/13/2016 06/03/2016 05/30/2016 05/30/2016  Does Patient Have a Medical Advance Directive? Yes Yes Yes Yes Yes Yes Yes  Type of Advance Directive Living will - [place];Living will [place];Living will Healthcare Power of [place] of [place]  Does patient want to make changes to medical advance directive? - No - Patient declined - - - No - Patient declined -  Copy of [place] in Chart? - - No - copy requested - - Yes Yes  Would patient like information on creating a medical advance directive? - - - - - - -  Pre-existing out of facility DNR order (yellow form or pink MOST form) - - - - - - -    Tobacco Social History   Tobacco Use  Smoking Status Former Smoker  . Types: Pipe  . Last attempt to quit: 07/14/1978  . Years since quitting: 39.7  Smokeless  Tobacco Never Used  Tobacco Comment   quit smoking 1979     Counseling given: Not Answered Comment: quit smoking 1979   Clinical Intake: Pain : No/denies pain    Past Medical History:  Diagnosis Date  . Arthritis    both knees  . Back pain at L4-L5 level   . Basal cell carcinoma   . BPH (benign prostatic hyperplasia)    hx s/p turp  . Chronic back pain    spondylosis  . Chronic kidney disease    kidney stones 1979  . DDD (degenerative disc disease), lumbar   . Diabetes mellitus    takes Metformin and Victoza daily-under control  . Diarrhea    x 1 today  . Diverticulitis   . Gallstones   . GERD (gastroesophageal reflux disease)    takes Omeprazole daily-under control  . H/O hiatal hernia   . H/O measles   . Hepatitis B    B-with Mono and jaundice 1960  . History of chicken pox   . History of colon polyps   . History of kidney stones   . Hyperlipidemia    takes Simvastatin and Tricor daily-under control  . Hypertension    takes Lisinopril daily-under control  . Joint pain    both knees  . Joint swelling    rt knee  . Kidney stones   . Melanoma ([place])    right hand;basal cell carcinoma  .  Neuropathy   . Peripheral vascular disease ([place])    diabetic neuropathy  . Pneumonia    hx of  . Prostate infection    currently taking macrobid  . Sleep apnea with use of continuous positive airway pressure (CPAP)   . Tinnitus   . Urinary incontinence   . UTI (lower urinary tract infection)    Past Surgical History:  Procedure Laterality Date  . BASAL CELL CARCINOMA EXCISION    . BOTOX INJECTION  03/22/2017   In the bladder for nightly incontinence.  . C5 and T1 bone chip removed   1986  . [place]  . CHOLECYSTECTOMY  1993  . COLONOSCOPY  2011  . cortisone injection    . EYE SURGERY     cataract sx 2017 bilateral  . I&D left knee Left 1958   states 22 times  . KNEE ARTHROSCOPY Right 07/16/2014   Procedure: RIGHT ARTHROSCOPY KNEE WITH Medial  and Lateral DEBRIDEMENT and chondroplasty;  Surgeon: Gearlean Alf, MD;  Location: [facility];  Service: Orthopedics;  Laterality: Right;  . LUMBAR LAMINECTOMY/DECOMPRESSION MICRODISCECTOMY Right 02/17/2014   Procedure: RIGHT LUMBAR TWO-THREE LAMINECTOMY;  Surgeon: Charlie Pitter, MD;  Location: [facility];  Service: Neurosurgery;  Laterality: Right;  right   . right shoulder surgery  2012   rotator cuff repair  . SHOULDER ARTHROSCOPY W/ ROTATOR CUFF REPAIR Left NOV 2010  . TONSILLECTOMY  1942  . TOTAL KNEE ARTHROPLASTY Right 05/27/2015   Procedure: RIGHT TOTAL KNEE ARTHROPLASTY;  Surgeon: Gaynelle Arabian, MD;  Location: [facility];  Service: Orthopedics;  Laterality: Right;  . TOTAL KNEE ARTHROPLASTY Left 05/30/2016   Procedure: LEFT TOTAL KNEE ARTHROPLASTY;  Surgeon: Gaynelle Arabian, MD;  Location: [facility];  Service: Orthopedics;  Laterality: Left;  . TRANSURETHRAL RESECTION OF PROSTATE  2006  . VASECTOMY  1983  . WISDOM TOOTH EXTRACTION     Family History  Problem Relation Age of Onset  . Heart disease Mother 65       Deceased  . Bladder Cancer Mother   . Breast cancer Mother   . Prostate cancer Father        Deceased-in 33s  . Healthy Brother   . Skin cancer Brother        "basal cell cancer in the bloodstream"  . Asthma Sister        Deceased  . Healthy Son        #1  . Healthy Daughter        #2  . Obesity Son        #2   Social History   Socioeconomic History  . Marital status: Married    Spouse name: Not on file  . Number of children: 4  . Years of education: Not on file  . Highest education level: Not on file  Occupational History  . Occupation: retired  Scientific laboratory technician  . Financial resource strain: Not on file  . Food insecurity:    Worry: Not on file    Inability: Not on file  . Transportation needs:    Medical: Not on file    Non-medical: Not on file  Tobacco Use  . Smoking status: Former Smoker    Types: Pipe    Last attempt to quit: 07/14/1978    Years since  quitting: 39.7  . Smokeless tobacco: Never Used  . Tobacco comment: quit smoking 1979  Substance and Sexual Activity  . Alcohol use: Yes    Comment:  OCCASIONAL  . Drug use: No  . Sexual activity: Never  Lifestyle  . Physical activity:    Days per week: Not on file    Minutes per session: Not on file  . Stress: Not on file  Relationships  . Social connections:    Talks on phone: Not on file    Gets together: Not on file    Attends religious service: Not on file    Active member of club or organization: Not on file    Attends meetings of clubs or organizations: Not on file    Relationship status: Not on file  Other Topics Concern  . Not on file  Social History Narrative  . Not on file    Outpatient Encounter Medications as of 04/12/2018  Medication Sig  . aspirin EC 81 MG tablet Take 81 mg by mouth daily.  [name] Kitchen CALCIUM-MAGNESIUM-VITAMIN D PO Take 2 tablets by mouth 2 (two) times daily.   . cephALEXin (KEFLEX) 500 MG capsule Take 500 mg by mouth daily.  . clonazePAM (KLONOPIN) 0.5 MG tablet   . FIBER PO Take 2 capsules by mouth 2 (two) times daily.   [name] Kitchen glucose blood (FREESTYLE LITE) test strip USE AS INSTRUCTED TO TEST BLOOD GLUCOSE TWICE A DAY  . Lancets (FREESTYLE) lancets USE 1 LANCET TWICE A DAY AS INSTRUCTED  . lisinopril (PRINIVIL,ZESTRIL) 5 MG tablet TAKE 1 TABLET EVERY EVENING  . Melatonin 10 MG TABS Take 1 tablet by mouth at bedtime.  . metFORMIN (GLUCOPHAGE) 500 MG tablet Take 1 tablet (500 mg total) by mouth 2 (two) times daily with a meal.  . Multiple Vitamin (MULTI-VITAMINS) TABS Take 1 g by mouth daily.  [name] Kitchen omeprazole (PRILOSEC) 20 MG capsule TAKE 1 CAPSULE DAILY (Patient taking differently: TAKE 1 CAPSULE BY MOUTH TWICE DAILY)  . Probiotic Product (PROBIOTIC ADVANCED PO) Take by mouth.  . simvastatin (ZOCOR) 10 MG tablet Take 1 tablet (10 mg total) by mouth at bedtime.  . TRICOR 145 MG tablet TAKE 1 TABLET EVERY EVENING  . vitamin C (ASCORBIC ACID) 500 MG tablet Take  500 mg by mouth daily.   No facility-administered encounter medications on file as of 04/12/2018.     Activities of Daily Living In your present state of health, do you have any difficulty performing the following activities: 04/12/2018  Hearing? N  Comment Tinnitus  Vision? N  Difficulty concentrating or making decisions? N  Walking or climbing stairs? Y  Comment Uses cane.  Dressing or bathing? N  Doing errands, shopping? N  Preparing Food and eating ? N  Using the Toilet? N  In the past six months, have you accidently leaked urine? N  Do you have problems with loss of bowel control? N  Managing your Medications? N  Managing your Finances? N  Housekeeping or managing your Housekeeping? Y  Comment Someone comes every 2 weeks.  Some recent data might be hidden    Patient Care Team: Copland, Gay Filler, MD as PCP - General (Family Medicine) Gaynelle Arabian, MD as Consulting Physician (Orthopedic Surgery) Deirdre Pippins, PA-C as Physician Assistant (Dermatology) Laurence Aly, OD (Optometry) Carolan Clines, MD as Consulting Physician (Urology)   Assessment:   This is a routine wellness examination for [place]. No ROS.  Medicare Wellness Visit. Additional risk factors are reflected in the social history.   Exercise Activities and Dietary recommendations Current Exercise Habits: The patient does not participate in regular exercise at present, Exercise limited by: None identified Diet (meal preparation, eat out,  water intake, caffeinated beverages, dairy products, fruits and vegetables): well balanced, on average, 3 meals per day   Goals    . Maintain independence       Fall Risk Fall Risk  04/12/2018 04/12/2017 04/10/2017 05/18/2016 02/15/2016  Falls in the past year? Yes Yes Yes No Yes  Number falls in past yr: 1 2 or more 1 - 1  Injury with Fall? Yes No No - Yes  Comment - - - - Nose  Risk Factor Category  - High Fall Risk - - High Fall Risk  Risk for fall due to : - - Impaired  balance/gait - Impaired balance/gait;Impaired vision  Risk for fall due to: Comment - - - - Up to use the bathroom lost balance  Follow up Education provided;Falls prevention discussed Falls prevention discussed Education provided;Falls prevention discussed - Falls prevention discussed    Depression Screen PHQ 2/9 Scores 04/12/2018 04/10/2017 05/18/2016 02/15/2016  PHQ - 2 Score 0 0 0 0  Exception Documentation - - - Patient refusal    Cognitive Function Ad8 score reviewed for issues:  Issues making decisions:no  Less interest in hobbies / activities:no  Repeats questions, stories (family complaining):no  Trouble using ordinary gadgets (microwave, computer, phone):no  Forgets the month or year: no  Mismanaging finances: no  Remembering appts:no  Daily problems with thinking and/or memory:no Ad8 score is=0         Immunization History  Administered Date(s) Administered  . Influenza, High Dose Seasonal PF 09/01/2016, 08/21/2017  . Influenza,inj,Quad PF,6+ Mos 08/20/2015  . Influenza-Unspecified 09/11/2014  . Pneumococcal Conjugate-13 05/13/2015  . Pneumococcal Polysaccharide-23 12/12/1998, 01/10/2018  . Td 02/15/2016    Screening Tests Health Maintenance  Topic Date Due  . HEMOGLOBIN A1C  05/23/2018  . INFLUENZA VACCINE  07/12/2018  . FOOT EXAM  01/10/2019  . OPHTHALMOLOGY EXAM  03/24/2019  . TETANUS/TDAP  02/14/2026  . PNA vac Low Risk Adult  Completed      Plan:   Follow up with Dr.Copland as scheduled  Continue to eat heart healthy diet (full of fruits, vegetables, whole grains, lean protein, water--limit salt, fat, and sugar intake) and increase physical activity as tolerated.  Continue doing brain stimulating activities (puzzles, reading, adult coloring books, staying active) to keep memory sharp.    I have personally reviewed and noted the following in the patient's chart:   . Medical and social history . Use of alcohol, tobacco or illicit drugs   . Current medications and supplements . Functional ability and status . Nutritional status . Physical activity . Advanced directives . List of other physicians . Hospitalizations, surgeries, and ER visits in previous 12 months . Vitals . Screenings to include cognitive, depression, and falls . Referrals and appointments  In addition, I have reviewed and discussed with patient certain preventive protocols, quality metrics, and best practice recommendations. A written personalized care plan for preventive services as well as general preventive health recommendations were provided to patient.     Shela Nevin, RN  04/12/2018   I have reviewed the above note by Ms. Vevelyn Royals and agree with her documentationDenny Peon MD

## 2018-04-11 ENCOUNTER — Ambulatory Visit: Payer: Medicare Other | Admitting: *Deleted

## 2018-04-12 ENCOUNTER — Encounter: Payer: Self-pay | Admitting: *Deleted

## 2018-04-12 ENCOUNTER — Ambulatory Visit (INDEPENDENT_AMBULATORY_CARE_PROVIDER_SITE_OTHER): Payer: Medicare Other | Admitting: *Deleted

## 2018-04-12 VITALS — BP 136/64 | HR 69 | Ht 71.0 in | Wt 233.8 lb

## 2018-04-12 DIAGNOSIS — Z0000 Encounter for general adult medical examination without abnormal findings: Secondary | ICD-10-CM | POA: Diagnosis not present

## 2018-04-12 NOTE — Patient Instructions (Signed)
Follow up with Dr.Copland as scheduled  Continue to eat heart healthy diet (full of fruits, vegetables, whole grains, lean protein, water--limit salt, fat, and sugar intake) and increase physical activity as tolerated.  Continue doing brain stimulating activities (puzzles, reading, adult coloring books, staying active) to keep memory sharp.    Matthew Nash , Thank you for taking time to come for your Medicare Wellness Visit. I appreciate your ongoing commitment to your health goals. Please review the following plan we discussed and let me know if I can assist you in the future.     This is a list of the screening recommended for you and due dates:  Health Maintenance  Topic Date Due  . Hemoglobin A1C  05/23/2018  . Flu Shot  07/12/2018  . Complete foot exam   01/10/2019  . Eye exam for diabetics  03/24/2019  . Tetanus Vaccine  02/14/2026  . Pneumonia vaccines  Completed   Health Maintenance, Male A healthy lifestyle and preventive care is important for your health and wellness. Ask your health care provider about what schedule of regular examinations is right for you. What should I know about weight and diet? Eat a Healthy Diet  Eat plenty of vegetables, fruits, whole grains, low-fat dairy products, and lean protein.  Do not eat a lot of foods high in solid fats, added sugars, or salt.  Maintain a Healthy Weight Regular exercise can help you achieve or maintain a healthy weight. You should:  Do at least 150 minutes of exercise each week. The exercise should increase your heart rate and make you sweat (moderate-intensity exercise).  Do strength-training exercises at least twice a week.  Watch Your Levels of Cholesterol and Blood Lipids  Have your blood tested for lipids and cholesterol every 5 years starting at 80 years of age. If you are at high risk for heart disease, you should start having your blood tested when you are 80 years old. You may need to have your cholesterol  levels checked more often if: ? Your lipid or cholesterol levels are high. ? You are older than 80 years of age. ? You are at high risk for heart disease.  What should I know about cancer screening? Many types of cancers can be detected early and may often be prevented. Lung Cancer  You should be screened every year for lung cancer if: ? You are a current smoker who has smoked for at least 30 years. ? You are a former smoker who has quit within the past 15 years.  Talk to your health care provider about your screening options, when you should start screening, and how often you should be screened.  Colorectal Cancer  Routine colorectal cancer screening usually begins at 80 years of age and should be repeated every 5-10 years until you are 80 years old. You may need to be screened more often if early forms of precancerous polyps or small growths are found. Your health care provider may recommend screening at an earlier age if you have risk factors for colon cancer.  Your health care provider may recommend using home test kits to check for hidden blood in the stool.  A small camera at the end of a tube can be used to examine your colon (sigmoidoscopy or colonoscopy). This checks for the earliest forms of colorectal cancer.  Prostate and Testicular Cancer  Depending on your age and overall health, your health care provider may do certain tests to screen for prostate and testicular cancer.  Talk to your health care provider about any symptoms or concerns you have about testicular or prostate cancer.  Skin Cancer  Check your skin from head to toe regularly.  Tell your health care provider about any new moles or changes in moles, especially if: ? There is a change in a mole's size, shape, or color. ? You have a mole that is larger than a pencil eraser.  Always use sunscreen. Apply sunscreen liberally and repeat throughout the day.  Protect yourself by wearing long sleeves, pants, a  wide-brimmed hat, and sunglasses when outside.  What should I know about heart disease, diabetes, and high blood pressure?  If you are 44-88 years of age, have your blood pressure checked every 3-5 years. If you are 55 years of age or older, have your blood pressure checked every year. You should have your blood pressure measured twice-once when you are at a hospital or clinic, and once when you are not at a hospital or clinic. Record the average of the two measurements. To check your blood pressure when you are not at a hospital or clinic, you can use: ? An automated blood pressure machine at a pharmacy. ? A home blood pressure monitor.  Talk to your health care provider about your target blood pressure.  If you are between 30-54 years old, ask your health care provider if you should take aspirin to prevent heart disease.  Have regular diabetes screenings by checking your fasting blood sugar level. ? If you are at a normal weight and have a low risk for diabetes, have this test once every three years after the age of 72. ? If you are overweight and have a high risk for diabetes, consider being tested at a younger age or more often.  A one-time screening for abdominal aortic aneurysm (AAA) by ultrasound is recommended for men aged 37-75 years who are current or former smokers. What should I know about preventing infection? Hepatitis B If you have a higher risk for hepatitis B, you should be screened for this virus. Talk with your health care provider to find out if you are at risk for hepatitis B infection. Hepatitis C Blood testing is recommended for:  Everyone born from [birth year] through 1963.  Anyone with known risk factors for hepatitis C.  Sexually Transmitted Diseases (STDs)  You should be screened each year for STDs including gonorrhea and chlamydia if: ? You are sexually active and are younger than 80 years of age. ? You are older than 80 years of age and your health care provider  tells you that you are at risk for this type of infection. ? Your sexual activity has changed since you were last screened and you are at an increased risk for chlamydia or gonorrhea. Ask your health care provider if you are at risk.  Talk with your health care provider about whether you are at high risk of being infected with HIV. Your health care provider may recommend a prescription medicine to help prevent HIV infection.  What else can I do?  Schedule regular health, dental, and eye exams.  Stay current with your vaccines (immunizations).  Do not use any tobacco products, such as cigarettes, chewing tobacco, and e-cigarettes. If you need help quitting, ask your health care provider.  Limit alcohol intake to no more than 2 drinks per day. One drink equals 12 ounces of beer, 5 ounces of wine, or 1 ounces of hard liquor.  Do not use street drugs.  Do  not share needles.  Ask your health care provider for help if you need support or information about quitting drugs.  Tell your health care provider if you often feel depressed.  Tell your health care provider if you have ever been abused or do not feel safe at home. This information is not intended to replace advice given to you by your health care provider. Make sure you discuss any questions you have with your health care provider. Document Released: 05/26/2008 Document Revised: 07/27/2016 Document Reviewed: 09/01/2015 Elsevier Interactive Patient Education  [employer].

## 2018-04-15 NOTE — Progress Notes (Addendum)
[place] at [facility] [address] [place], [place] [address] [phone]  Date:  04/16/2018   Name:  Matthew Nash   DOB:  12-25-1935   MRN:  [id number]  PCP:  Darreld Mclean, MD    Chief Complaint: Braces on Leg (how long does he need to wear the braces, strength of ankles? ortho referral)   History of Present Illness:  Matthew Nash is a 82 y.o. very pleasant male patient who presents with the following:  Multiple medical problems including OSA, sleep disturbance/ acting out while asleep, incontinence, DM, peripheral neuropathy Here today with concern regarding his bilateral AFO braces   Recent neurology note per [facility] doc, Dr. Lavone Neri: Assessment:  1. Diabetic polyneuropathy associated with type 2 diabetes mellitus ([place])  2. REM behavioral disorder His gait imbalance is due to the neuropathy. There may a contribution from lumbar radiculopathy but he does not have any significant back pain. He has no features of Parkinsonism; e.g. No bradykinesia, no resting tremor, no cogwheel rigidity. His postural instability is related to neuropathy in feet and bilateral foot drop which is likely also due to neuropathy. Recommendations 1. Start Clonazepam 0.5mg  QHS for REM behavior disorder  His urologist is Dr. Venia Minks  He was put in bilateral AFOs by PT in 2016 he thinks. He is not sure quite why he got these,  On discussion it seems like he had some mild foot drop.[name] Kitchen  He also does have neuropathy in his great toes bilaterally and cannot feel his big toes really.  This is probably why he has the foot drop in part.  His problem is that the AFO braces require him to wear 2 shoe sizes larger than his baseline, and the very large shoes cause him to trip and have even made him fall. He is not sure what to do about this issue  He is using a cane to walk and generally does ok with this Needs an A1c today   Lab Results  Component Value Date    HGBA1C 6.9 (H) 11/22/2017    Patient Active Problem List   Diagnosis Date Noted  . Pelvic floor dysfunction 04/04/2018  . Change in bowel habits 04/04/2018  . OSA on CPAP 01/12/2018  . REM behavioral disorder 01/12/2018  . Diarrhea 09/15/2017  . Incontinence of feces 09/15/2017  . Peripheral neuropathy 10/20/2016  . Bladder outlet obstruction 05/02/2016  . OA (osteoarthritis) of knee 05/27/2015  . Diabetes mellitus type II, controlled ([place]) 11/26/2014  . Gastroesophageal reflux disease without esophagitis 11/26/2014  . Arthritis of both knees 11/26/2014  . Absence of bladder continence 11/26/2014  . Hyperlipidemia LDL goal <100 11/26/2014  . Acute medial meniscal tear 07/16/2014  . Lumbosacral spondylosis without myelopathy 02/17/2014  . Spondylosis, lumbosacral 02/17/2014    Past Medical History:  Diagnosis Date  . Arthritis    both knees  . Back pain at L4-L5 level   . Basal cell carcinoma   . BPH (benign prostatic hyperplasia)    hx s/p turp  . Chronic back pain    spondylosis  . Chronic kidney disease    kidney stones 1979  . DDD (degenerative disc disease), lumbar   . Diabetes mellitus    takes Metformin and Victoza daily-under control  . Diarrhea    x 1 today  . Diverticulitis   . Gallstones   . GERD (gastroesophageal reflux disease)    takes Omeprazole daily-under control  .  H/O hiatal hernia   . H/O measles   . Hepatitis B    B-with Mono and jaundice 1960  . History of chicken pox   . History of colon polyps   . History of kidney stones   . Hyperlipidemia    takes Simvastatin and Tricor daily-under control  . Hypertension    takes Lisinopril daily-under control  . Joint pain    both knees  . Joint swelling    rt knee  . Kidney stones   . Melanoma ([place])    right hand;basal cell carcinoma  . Neuropathy   . Peripheral vascular disease ([place])    diabetic neuropathy  . Pneumonia    hx of  . Prostate infection    currently taking macrobid  .  Sleep apnea with use of continuous positive airway pressure (CPAP)   . Tinnitus   . Urinary incontinence   . UTI (lower urinary tract infection)     Past Surgical History:  Procedure Laterality Date  . BASAL CELL CARCINOMA EXCISION    . BOTOX INJECTION  03/22/2017   In the bladder for nightly incontinence.  . C5 and T1 bone chip removed   1986  . [place]  . CHOLECYSTECTOMY  1993  . COLONOSCOPY  2011  . cortisone injection    . EYE SURGERY     cataract sx 2017 bilateral  . I&D left knee Left 1958   states 22 times  . KNEE ARTHROSCOPY Right 07/16/2014   Procedure: RIGHT ARTHROSCOPY KNEE WITH Medial and Lateral DEBRIDEMENT and chondroplasty;  Surgeon: Gearlean Alf, MD;  Location: [facility];  Service: Orthopedics;  Laterality: Right;  . LUMBAR LAMINECTOMY/DECOMPRESSION MICRODISCECTOMY Right 02/17/2014   Procedure: RIGHT LUMBAR TWO-THREE LAMINECTOMY;  Surgeon: Charlie Pitter, MD;  Location: [place] [facility]UR[facility];  Service: Neurosurgery;  Laterality: Right;  right   . right shoulder surgery  2012   rotator cuff repair  . SHOULDER ARTHROSCOPY W/ ROTATOR CUFF REPAIR Left NOV 2010  . TONSILLECTOMY  1942  . TOTAL KNEE ARTHROPLASTY Right 05/27/2015   Procedure: RIGHT TOTAL KNEE ARTHROPLASTY;  Surgeon: Gaynelle Arabian, MD;  Location: [facility];  Service: Orthopedics;  Laterality: Right;  . TOTAL KNEE ARTHROPLASTY Left 05/30/2016   Procedure: LEFT TOTAL KNEE ARTHROPLASTY;  Surgeon: Gaynelle Arabian, MD;  [facility]: [facility];  Service: Orthopedics;  Laterality: Left;  . TRANSURETHRAL RESECTION OF PROSTATE  2006  . VASECTOMY  1983  . WISDOM TOOTH EXTRACTION      Social History   Tobacco Use  . Smoking status: Former Smoker    Types: Pipe    Last attempt to quit: 07/14/1978    Years since quitting: 39.7  . Smokeless tobacco: Never Used  . Tobacco comment: quit smoking 1979  Substance Use Topics  . Alcohol use: Yes    Comment: OCCASIONAL  . Drug use: No    Family History  Problem Relation  Age of Onset  . Heart disease Mother 74       Deceased  . Bladder Cancer Mother   . Breast cancer Mother   . Prostate cancer Father        Deceased-in 25s  . Healthy Brother   . Skin cancer Brother        "basal cell cancer in the bloodstream"  . Asthma Sister        Deceased  . Healthy Son        #1  . Healthy Daughter        #  2  . Obesity Son        #2    Allergies  Allergen Reactions  . Albamycin [Novobiocin] Other (See Comments)    Made me look like a strawberry   . Penicillins Rash and Other (See Comments)    Has patient had a PCN reaction causing immediate rash, facial/tongue/throat swelling, SOB or lightheadedness with hypotension:  no Has patient had a PCN reaction causing severe rash involving mucus membranes or skin necrosis: no Has patient had a PCN reaction that required hospitalization no Has patient had a PCN reaction occurring within the last 10 years: no - childhood reaction If all of the above answers are "NO", then may proceed with Cephalosporin use.     Medication list has been reviewed and updated.  Current Outpatient Medications on File Prior to Visit  Medication Sig Dispense Refill  . aspirin EC 81 MG tablet Take 81 mg by mouth daily.    [name] Kitchen CALCIUM-MAGNESIUM-VITAMIN D PO Take 2 tablets by mouth 2 (two) times daily.     . cephALEXin (KEFLEX) 500 MG capsule Take 500 mg by mouth daily.    . clonazePAM (KLONOPIN) 0.5 MG tablet     . FIBER PO Take 2 capsules by mouth 2 (two) times daily.     [name] Kitchen glucose blood (FREESTYLE LITE) test strip USE AS INSTRUCTED TO TEST BLOOD GLUCOSE TWICE A DAY 200 each 12  . Lancets (FREESTYLE) lancets USE 1 LANCET TWICE A DAY AS INSTRUCTED 200 each 3  . lisinopril (PRINIVIL,ZESTRIL) 5 MG tablet TAKE 1 TABLET EVERY EVENING 90 tablet 3  . Melatonin 10 MG TABS Take 1 tablet by mouth at bedtime.    . metFORMIN (GLUCOPHAGE) 500 MG tablet Take 1 tablet (500 mg total) by mouth 2 (two) times daily with a meal. 180 tablet 3  . Multiple  Vitamin (MULTI-VITAMINS) TABS Take 1 g by mouth daily.    [name] Kitchen omeprazole (PRILOSEC) 20 MG capsule TAKE 1 CAPSULE DAILY (Patient taking differently: TAKE 1 CAPSULE BY MOUTH TWICE DAILY) 90 capsule 3  . Polyethylene Glycol 3350 (MIRALAX PO) Take by mouth every other day.    . Probiotic Product (PROBIOTIC ADVANCED PO) Take by mouth.    . simvastatin (ZOCOR) 10 MG tablet Take 1 tablet (10 mg total) by mouth at bedtime. 90 tablet 2  . TRICOR 145 MG tablet TAKE 1 TABLET EVERY EVENING 90 tablet 3  . vitamin C (ASCORBIC ACID) 500 MG tablet Take 500 mg by mouth daily.     No current facility-administered medications on file prior to visit.     Review of Systems:  As per HPI- otherwise negative. He is otherwise feeling well, no fever or chills  Physical Examination: Vitals:   04/16/18 1630  BP: 128/84  Pulse: 78  Resp: 16  SpO2: 92%   Vitals:   04/16/18 1630  Weight: 233 lb (105.7 kg)  Height: 5\' 11"  (1.803 m)   Body mass index is 32.5 kg/m. Ideal Body Weight: Weight in (lb) to have BMI = 25: 178.9  GEN: WDWN, NAD, Non-toxic, A & O x 3, looks well, obese HEENT: Atraumatic, Normocephalic. Neck supple. No masses, No LAD. Ears and Nose: No external deformity. CV: RRR, No M/G/R. No JVD. No thrill. No extra heart sounds. PULM: CTA B, no wheezes, crackles, rhonchi. No retractions. No resp. distress. No accessory muscle use. EXTR: No c/c/e NEURO Normal gait.  PSYCH: Normally interactive. Conversant. Not depressed or anxious appearing.  Calm demeanor.  Had patient walk  barefoot. He does not have a dramatic foot drop but his toe does not raise when he steps either- foot remains parallel to the floor and he is liable to catch his toe. This is corrected by his AFO braces He is not able to feel my touching his big toes bilaterally, but can feel the rest of his feet. No skin breakdown noted   Assessment and Plan: Controlled type 2 diabetes mellitus without complication, without long-term current  use of insulin ([place]) - Plan: Hemoglobin A1c  Polyneuropathy associated with underlying disease ([place])  Gait instability  Called and discussed with one of the techs at [employer].  They do think that a carbon AFO would be less cumbersome for him and better for his neuropathy. Gave pt an rx to have these made and he plans to see them asap Will check A1c today as well   Signed Lamar Blinks, MD  Received his A1c Results for orders placed or performed in visit on 04/16/18  Hemoglobin A1c  Result Value Ref Range   Hgb A1c MFr Bld 6.6 (H) 4.6 - 6.5 %   Message to pt- A1c looks good

## 2018-04-16 ENCOUNTER — Ambulatory Visit (INDEPENDENT_AMBULATORY_CARE_PROVIDER_SITE_OTHER): Payer: Medicare Other | Admitting: Family Medicine

## 2018-04-16 ENCOUNTER — Encounter: Payer: Self-pay | Admitting: Family Medicine

## 2018-04-16 VITALS — BP 128/84 | HR 78 | Resp 16 | Ht 71.0 in | Wt 233.0 lb

## 2018-04-16 DIAGNOSIS — G63 Polyneuropathy in diseases classified elsewhere: Secondary | ICD-10-CM

## 2018-04-16 DIAGNOSIS — R2681 Unsteadiness on feet: Secondary | ICD-10-CM | POA: Diagnosis not present

## 2018-04-16 DIAGNOSIS — E119 Type 2 diabetes mellitus without complications: Secondary | ICD-10-CM

## 2018-04-16 NOTE — Patient Instructions (Signed)
It was nice to see you today! Take care and please see me in 3-4 months for a recheck [employer] should be able to make AFOs for you that are less cumbersome

## 2018-04-17 ENCOUNTER — Encounter: Payer: Self-pay | Admitting: Family Medicine

## 2018-04-17 LAB — HEMOGLOBIN A1C: Hgb A1c MFr Bld: 6.6 % — ABNORMAL HIGH (ref 4.6–6.5)

## 2018-04-26 ENCOUNTER — Other Ambulatory Visit: Payer: Self-pay | Admitting: Family Medicine

## 2018-04-28 ENCOUNTER — Other Ambulatory Visit: Payer: Self-pay

## 2018-04-28 ENCOUNTER — Encounter (HOSPITAL_BASED_OUTPATIENT_CLINIC_OR_DEPARTMENT_OTHER): Payer: Self-pay | Admitting: Emergency Medicine

## 2018-04-28 ENCOUNTER — Emergency Department (HOSPITAL_BASED_OUTPATIENT_CLINIC_OR_DEPARTMENT_OTHER)
Admission: EM | Admit: 2018-04-28 | Discharge: 2018-04-28 | Disposition: A | Discharge status: Home / Self Care | Payer: Medicare Other | Attending: Emergency Medicine | Admitting: Emergency Medicine

## 2018-04-28 DIAGNOSIS — N189 Chronic kidney disease, unspecified: Secondary | ICD-10-CM | POA: Insufficient documentation

## 2018-04-28 DIAGNOSIS — Z7982 Long term (current) use of aspirin: Secondary | ICD-10-CM | POA: Insufficient documentation

## 2018-04-28 DIAGNOSIS — Z7984 Long term (current) use of oral hypoglycemic drugs: Secondary | ICD-10-CM | POA: Insufficient documentation

## 2018-04-28 DIAGNOSIS — Z87891 Personal history of nicotine dependence: Secondary | ICD-10-CM | POA: Diagnosis not present

## 2018-04-28 DIAGNOSIS — Z960 Presence of urogenital implants: Secondary | ICD-10-CM | POA: Diagnosis not present

## 2018-04-28 DIAGNOSIS — Z79899 Other long term (current) drug therapy: Secondary | ICD-10-CM | POA: Diagnosis not present

## 2018-04-28 DIAGNOSIS — I129 Hypertensive chronic kidney disease with stage 1 through stage 4 chronic kidney disease, or unspecified chronic kidney disease: Secondary | ICD-10-CM | POA: Diagnosis not present

## 2018-04-28 DIAGNOSIS — R509 Fever, unspecified: Secondary | ICD-10-CM | POA: Diagnosis present

## 2018-04-28 DIAGNOSIS — E1122 Type 2 diabetes mellitus with diabetic chronic kidney disease: Secondary | ICD-10-CM | POA: Insufficient documentation

## 2018-04-28 DIAGNOSIS — R531 Weakness: Secondary | ICD-10-CM | POA: Diagnosis not present

## 2018-04-28 LAB — URINALYSIS, ROUTINE W REFLEX MICROSCOPIC
Bilirubin Urine: NEGATIVE
Glucose, UA: NEGATIVE mg/dL
Hgb urine dipstick: NEGATIVE
Ketones, ur: NEGATIVE mg/dL
Nitrite: NEGATIVE
Protein, ur: NEGATIVE mg/dL
Specific Gravity, Urine: 1.015 (ref 1.005–1.030)
pH: 6 (ref 5.0–8.0)

## 2018-04-28 LAB — URINALYSIS, MICROSCOPIC (REFLEX)

## 2018-04-28 MED ORDER — SODIUM CHLORIDE 0.9 % IV BOLUS
500.0000 mL | Freq: Once | INTRAVENOUS | Status: AC
  Administered 2018-04-28: 500 mL via INTRAVENOUS

## 2018-04-28 MED ORDER — SODIUM CHLORIDE 0.9 % IV SOLN
2.0000 g | Freq: Once | INTRAVENOUS | Status: AC
  Administered 2018-04-28: 2 g via INTRAVENOUS
  Filled 2018-04-28: qty 20

## 2018-04-28 MED ORDER — CEPHALEXIN 500 MG PO CAPS: 500.0000 mg | ORAL_CAPSULE | Freq: Three times a day (TID) | ORAL | 0 refills | Status: DC

## 2018-04-28 NOTE — ED Triage Notes (Addendum)
Pt c/o fever today. States he has to use a catheter to empty his bladder and that there was blood in it today. States fever was 101 and took ibuprofen at 1:00

## 2018-04-28 NOTE — ED Notes (Signed)
ED Provider at bedside. 

## 2018-04-28 NOTE — ED Notes (Signed)
Pt ambulatory to bathroom without difficulty per MD who saw pt walking.  Pt went to bathroom to self cath for urine sample. Pt states that he lost his balance while in bathroom and fell. Denies hitting head, denies injuries. No bruises, abrasions, redness noted. Pt assisted into wheelchair with 4 staff present. Taken back to room.  Safety zone submitted.  EDP and charge RN aware.

## 2018-04-30 LAB — URINE CULTURE: Culture: <10000 — AB

## 2018-05-02 ENCOUNTER — Telehealth: Payer: Self-pay

## 2018-05-02 NOTE — Telephone Encounter (Signed)
Results have been faxed to urologist per patient request.

## 2018-05-02 NOTE — ED Provider Notes (Signed)
[facility] EMERGENCY DEPARTMENT Provider Note   CSN: [id number] Arrival date & time: 04/28/18  1335     History   Chief Complaint Chief Complaint  Patient presents with  . Fever    HPI Matthew Nash is a 82 y.o. male.  HPI  82yM with generalized weakness. Woke up feeling very fatigued. Subjective fever.  Pt self caths. Noted blood today. Denies any acute pain. No cough. No dyspnea. No rash. No n/v/d.  Past Medical History:  Diagnosis Date  . Arthritis    both knees  . Back pain at L4-L5 level   . Basal cell carcinoma   . BPH (benign prostatic hyperplasia)    hx s/p turp  . Chronic back pain    spondylosis  . Chronic kidney disease    kidney stones 1979  . DDD (degenerative disc disease), lumbar   . Diabetes mellitus    takes Metformin and Victoza daily-under control  . Diarrhea    x 1 today  . Diverticulitis   . Gallstones   . GERD (gastroesophageal reflux disease)    takes Omeprazole daily-under control  . H/O hiatal hernia   . H/O measles   . Hepatitis B    B-with Mono and jaundice 1960  . History of chicken pox   . History of colon polyps   . History of kidney stones   . Hyperlipidemia    takes Simvastatin and Tricor daily-under control  . Hypertension    takes Lisinopril daily-under control  . Joint pain    both knees  . Joint swelling    rt knee  . Kidney stones   . Melanoma ([place])    right hand;basal cell carcinoma  . Neuropathy   . Peripheral vascular disease ([place])    diabetic neuropathy  . Pneumonia    hx of  . Prostate infection    currently taking macrobid  . Sleep apnea with use of continuous positive airway pressure (CPAP)   . Tinnitus   . Urinary incontinence   . UTI (lower urinary tract infection)     Patient Active Problem List   Diagnosis Date Noted  . Pelvic floor dysfunction 04/04/2018  . Change in bowel habits 04/04/2018  . OSA on CPAP 01/12/2018  . REM behavioral disorder 01/12/2018  . Diarrhea 09/15/2017    . Incontinence of feces 09/15/2017  . Peripheral neuropathy 10/20/2016  . Bladder outlet obstruction 05/02/2016  . OA (osteoarthritis) of knee 05/27/2015  . Diabetes mellitus type II, controlled ([place]) 11/26/2014  . Gastroesophageal reflux disease without esophagitis 11/26/2014  . Arthritis of both knees 11/26/2014  . Absence of bladder continence 11/26/2014  . Hyperlipidemia LDL goal <100 11/26/2014  . Acute medial meniscal tear 07/16/2014  . Lumbosacral spondylosis without myelopathy 02/17/2014  . Spondylosis, lumbosacral 02/17/2014    Past Surgical History:  Procedure Laterality Date  . BASAL CELL CARCINOMA EXCISION    . BOTOX INJECTION  03/22/2017   In the bladder for nightly incontinence.  . C5 and T1 bone chip removed   1986  . [place]  . CHOLECYSTECTOMY  1993  . COLONOSCOPY  2011  . cortisone injection    . EYE SURGERY     cataract sx 2017 bilateral  . I&D left knee Left 1958   states 22 times  . KNEE ARTHROSCOPY Right 07/16/2014   Procedure: RIGHT ARTHROSCOPY KNEE WITH Medial and Lateral DEBRIDEMENT and chondroplasty;  Surgeon: Gearlean Alf, MD;  [facility];  Service: Orthopedics;  Laterality: Right;  . LUMBAR LAMINECTOMY/DECOMPRESSION MICRODISCECTOMY Right 02/17/2014   Procedure: RIGHT LUMBAR TWO-THREE LAMINECTOMY;  Surgeon: Charlie Pitter, MD;  Location: [place] NEURO ORS;  Service: Neurosurgery;  Laterality: Right;  right   . right shoulder surgery  2012   rotator cuff repair  . SHOULDER ARTHROSCOPY W/ ROTATOR CUFF REPAIR Left NOV 2010  . TONSILLECTOMY  1942  . TOTAL KNEE ARTHROPLASTY Right 05/27/2015   Procedure: RIGHT TOTAL KNEE ARTHROPLASTY;  Surgeon: Gaynelle Arabian, MD;  [facility];  Service: Orthopedics;  Laterality: Right;  . TOTAL KNEE ARTHROPLASTY Left 05/30/2016   Procedure: LEFT TOTAL KNEE ARTHROPLASTY;  Surgeon: Gaynelle Arabian, MD;  [facility];  Service: Orthopedics;  Laterality: Left;  . TRANSURETHRAL RESECTION OF PROSTATE   2006  . VASECTOMY  1983  . WISDOM TOOTH EXTRACTION          Home Medications    Prior to Admission medications   Medication Sig Start Date End Date Taking? Authorizing Provider  aspirin EC 81 MG tablet Take 81 mg by mouth daily.    [provider]  CALCIUM-MAGNESIUM-VITAMIN D PO Take 2 tablets by mouth 2 (two) times daily.     [provider]  cephALEXin (KEFLEX) 500 MG capsule Take 500 mg by mouth daily.    [provider]  cephALEXin (KEFLEX) 500 MG capsule Take 1 capsule (500 mg total) by mouth 3 (three) times daily. 04/28/18   Virgel Manifold, MD  clonazePAM Bobbye [place]) 0.5 MG tablet  02/12/18   [provider]  FIBER PO Take 2 capsules by mouth 2 (two) times daily.     [provider]  glucose blood (FREESTYLE LITE) test strip USE AS INSTRUCTED TO TEST BLOOD GLUCOSE TWICE A DAY 03/14/18   Copland, Gay Filler, MD  Lancets (FREESTYLE) lancets USE 1 LANCET TWICE A DAY AS INSTRUCTED 03/14/18   Copland, Gay Filler, MD  lisinopril (PRINIVIL,ZESTRIL) 5 MG tablet TAKE 1 TABLET EVERY EVENING 01/19/18   Copland, Gay Filler, MD  Melatonin 10 MG TABS Take 1 tablet by mouth at bedtime.    [provider]  metFORMIN (GLUCOPHAGE) 500 MG tablet Take 1 tablet (500 mg total) by mouth 2 (two) times daily with a meal. 08/21/17   Copland, Gay Filler, MD  Multiple Vitamin (MULTI-VITAMINS) TABS Take 1 g by mouth daily.    [provider]  omeprazole (PRILOSEC) 20 MG capsule TAKE 1 CAPSULE DAILY Patient taking differently: TAKE 1 CAPSULE BY MOUTH TWICE DAILY 06/15/17   Copland, Gay Filler, MD  Polyethylene Glycol 3350 (MIRALAX PO) Take by mouth every other day.    [provider]  Probiotic Product (PROBIOTIC ADVANCED PO) Take by mouth.    [provider]  simvastatin (ZOCOR) 10 MG tablet TAKE 1 TABLET AT BEDTIME 04/26/18   Copland, Gay Filler, MD  TRICOR 145 MG tablet TAKE 1 TABLET EVERY EVENING 11/16/17   Copland, Gay Filler, MD  vitamin C  (ASCORBIC ACID) 500 MG tablet Take 500 mg by mouth daily.    [provider]    Family History Family History  Problem Relation Age of Onset  . Heart disease Mother 54       Deceased  . Bladder Cancer Mother   . Breast cancer Mother   . Prostate cancer Father        Deceased-in 48s  . Healthy Brother   . Skin cancer Brother        "basal cell cancer in the bloodstream"  .  Asthma Sister        Deceased  . Healthy Son        #1  . Healthy Daughter        #2  . Obesity Son        #2    Social History Social History   Tobacco Use  . Smoking status: Former Smoker    Types: Pipe    Last attempt to quit: 07/14/1978    Years since quitting: 39.8  . Smokeless tobacco: Never Used  . Tobacco comment: quit smoking 1979  Substance Use Topics  . Alcohol use: Yes    Comment: OCCASIONAL  . Drug use: No     Allergies   Albamycin [novobiocin] and Penicillins   Review of Systems Review of Systems  All systems reviewed and negative, other than as noted in HPI.  Physical Exam Updated Vital Signs BP 119/63 (BP Location: Right Arm)   Pulse 71   Temp 99 F (37.2 C) (Oral)   Resp 18   Ht 5\' 11"  (1.803 m)   Wt 105.7 kg (233 lb)   SpO2 94%   BMI 32.50 kg/m   Physical Exam  Constitutional: He appears well-developed and well-nourished. No distress.  HENT:  Head: Normocephalic and atraumatic.  Eyes: Conjunctivae are normal. Right eye exhibits no discharge. Left eye exhibits no discharge.  Neck: Neck supple.  Cardiovascular: Normal rate, regular rhythm and normal heart sounds. Exam reveals no gallop and no friction rub.  No murmur heard. Pulmonary/Chest: Effort normal and breath sounds normal. No respiratory distress.  Abdominal: Soft. He exhibits no distension. There is no tenderness.  Musculoskeletal: He exhibits no edema or tenderness.  Neurological: He is alert.  Skin: Skin is warm and dry.  Psychiatric: He has a normal mood and affect. His behavior is normal.  Thought content normal.  Nursing note and vitals reviewed.    ED Treatments / Results  Labs (all labs ordered are listed, but only abnormal results are displayed) Labs Reviewed  URINE CULTURE - Abnormal; Notable for the following components:      Result Value   Culture   (*)    Value: <10,000 COLONIES/mL INSIGNIFICANT GROWTH Performed at [facility], [address] [place], [place] [address]    All other components within normal limits  URINALYSIS, ROUTINE W REFLEX MICROSCOPIC - Abnormal; Notable for the following components:   Leukocytes, UA TRACE (*)    All other components within normal limits  URINALYSIS, MICROSCOPIC (REFLEX) - Abnormal; Notable for the following components:   Bacteria, UA FEW (*)    All other components within normal limits    EKG None  Radiology No results found.  Procedures Procedures (including critical care time)  Medications Ordered in ED Medications  sodium chloride 0.9 % bolus 500 mL (0 mLs Intravenous Stopped 04/28/18 1558)  cefTRIAXone (ROCEPHIN) 2 g in sodium chloride 0.9 % 100 mL IVPB (0 g Intravenous Stopped 04/28/18 1622)  sodium chloride 0.9 % bolus 500 mL (0 mLs Intravenous Stopped 04/28/18 1558)     Initial Impression / Assessment and Plan / ED Course  I have reviewed the triage vital signs and the nursing notes.  Pertinent labs & imaging results that were available during my care of the patient were reviewed by me and considered in my medical decision making (see chart for details).     82ym with generalized weakness. Noted blood in urine today. UA not overly impressive. Will send culture. I really don't have  an alternative source for fever/weakness otherwise though. Increased risk although intermittent cath. Will cover with abx.   Final Clinical Impressions(s) / ED Diagnoses   Final diagnoses:  Generalized weakness    ED Discharge Orders        Ordered    cephALEXin (KEFLEX) 500 MG capsule  3 times daily      04/28/18 1541       Virgel Manifold, MD 05/02/18 1606

## 2018-05-02 NOTE — Telephone Encounter (Signed)
Copied from [place] [id number]. Topic: Inquiry >> May 01, 2018 11:04 AM Con[place], NT wrote: Reason for CRM: patient is calling and states he would like his lab results from 04/28/18 faxed to his Urology, Dr. Rosario Adie. Fax [phone]

## 2018-05-02 NOTE — Telephone Encounter (Signed)
Copied from [place] [id number]. Topic: Inquiry >> May 01, 2018 11:04 AM Conception [place], NT wrote: Reason for CRM: patient is calling and states he would like his lab results from 04/28/18 faxed to his Urology, Dr. Rosario Adie. Fax [phone]

## 2018-05-08 ENCOUNTER — Telehealth: Payer: Self-pay | Admitting: *Deleted

## 2018-05-08 DIAGNOSIS — N319 Neuromuscular dysfunction of bladder, unspecified: Secondary | ICD-10-CM | POA: Diagnosis not present

## 2018-05-08 DIAGNOSIS — R32 Unspecified urinary incontinence: Secondary | ICD-10-CM | POA: Diagnosis not present

## 2018-05-08 NOTE — Telephone Encounter (Signed)
Received Physician Orders from [place] and [place]; forwarded to provider/SLS 05/28

## 2018-05-10 ENCOUNTER — Telehealth: Payer: Self-pay | Admitting: Pulmonary Disease

## 2018-05-10 NOTE — Telephone Encounter (Signed)
Refer to sleep lab for mask fitting

## 2018-05-10 NOTE — Telephone Encounter (Signed)
Received the following email from patient's wife [name] via her account. Advised her that since it is related to Mr. Raben, I would need to send the information via his chart.   ----- Message -----  From: Geralyn Flash  Sent: 5/30/20191:37 AM EDT  To: Leanna Sato. Elsworth Soho, MD Subject: Non-Urgent Medical Question  I need some advice because my husband, Kendell Sagraves CPAC mask leaks so bad that I cannot get any sleep. I have tried both the foam ear plugs and wax ear plugs and they do not help. I am not comfortable waking him because he is taking a medication to keep him from acting out his dreams and 10mg  of Melatonin. Thank you. Geralyn Flash  Dr. Elsworth Soho, please advise. Thanks!

## 2018-05-15 NOTE — Telephone Encounter (Signed)
Spoke with patient. He stated that he would be interesting in the mask fitting session but he just received a 3 month supply of masks from [facility]. He is not sure if [facility] will accept a return. He has not opened the box. Advised patient that I would call [facility] to see if it would be possible for him to return the ill-fitting masks. He verbalized understanding.

## 2018-05-17 ENCOUNTER — Other Ambulatory Visit: Payer: Self-pay | Admitting: Pulmonary Disease

## 2018-05-17 ENCOUNTER — Encounter: Payer: Self-pay | Admitting: Pulmonary Disease

## 2018-05-17 DIAGNOSIS — G4733 Obstructive sleep apnea (adult) (pediatric): Secondary | ICD-10-CM

## 2018-05-17 DIAGNOSIS — Z9989 Dependence on other enabling machines and devices: Principal | ICD-10-CM

## 2018-05-17 NOTE — Telephone Encounter (Signed)
Spoke to [place] with [facility], and was advised that pt can return mask if package is unopened and it's within 30 days of receiving mask.  Pt is aware of this information via mychart. Order has been placed for mask fitting session.  Nothing further is needed.

## 2018-05-22 ENCOUNTER — Ambulatory Visit (HOSPITAL_BASED_OUTPATIENT_CLINIC_OR_DEPARTMENT_OTHER): Payer: Medicare Other | Attending: Pulmonary Disease | Admitting: Radiology

## 2018-05-22 DIAGNOSIS — G4733 Obstructive sleep apnea (adult) (pediatric): Secondary | ICD-10-CM

## 2018-05-22 DIAGNOSIS — Z9989 Dependence on other enabling machines and devices: Principal | ICD-10-CM

## 2018-06-22 DIAGNOSIS — R32 Unspecified urinary incontinence: Secondary | ICD-10-CM | POA: Diagnosis not present

## 2018-07-06 ENCOUNTER — Encounter: Payer: Self-pay | Admitting: *Deleted

## 2018-08-23 ENCOUNTER — Ambulatory Visit (INDEPENDENT_AMBULATORY_CARE_PROVIDER_SITE_OTHER): Payer: Medicare Other | Admitting: Family Medicine

## 2018-08-23 ENCOUNTER — Encounter: Payer: Self-pay | Admitting: Family Medicine

## 2018-08-23 VITALS — BP 110/66 | HR 88 | Temp 98.3°F | Ht 71.0 in | Wt 224.0 lb

## 2018-08-23 DIAGNOSIS — E785 Hyperlipidemia, unspecified: Secondary | ICD-10-CM

## 2018-08-23 DIAGNOSIS — G63 Polyneuropathy in diseases classified elsewhere: Secondary | ICD-10-CM

## 2018-08-23 DIAGNOSIS — Z23 Encounter for immunization: Secondary | ICD-10-CM | POA: Diagnosis not present

## 2018-08-23 DIAGNOSIS — E1169 Type 2 diabetes mellitus with other specified complication: Secondary | ICD-10-CM

## 2018-08-23 DIAGNOSIS — I10 Essential (primary) hypertension: Secondary | ICD-10-CM | POA: Diagnosis not present

## 2018-08-23 DIAGNOSIS — E119 Type 2 diabetes mellitus without complications: Secondary | ICD-10-CM

## 2018-08-23 NOTE — Progress Notes (Addendum)
[place] [facility] [address] [place], [place] [address] [phone]  Date:  08/23/2018   Name:  Matthew Nash   DOB:  Aug 02, 1936   MRN:  [id number]  PCP:  Darreld Mclean, MD    Chief Complaint: Discuss Medications (Wonders if there are some medications he is taking that he should not like aspirin); Diabetes; and Flu Vaccine   History of Present Illness:  Matthew Nash is a 82 y.o. very pleasant male patient who presents with the following:  Following up today History of DM, OSA/ CPAP, peripheral neuropathy, lumbar laminectomy, sleep disturbance, AFO bilaterally   He has noted a hoarse and weak voice for the last 6 months or so He is not sure if this is getting worse or not, not sure why this might have happened to him His wife has complained that he is speaking too quietly and that she cannot hear him He will notice nasal drainage in the am and at night- his nose will just drip sometimes  He is not coughing or sneezing in particular   Lab Results  Component Value Date   HGBA1C 6.6 (H) 04/16/2018   Flu shot today He is traveling to [place] in about 3 weeks - he is quite excited about this trip  He has been seen by neurology at [facility]- they gave him some clonazepam for his REM behavior disorder He is currently taking clonazepam and also melatonin for sleep  He does not really think this is helping him that much however, as he continues to exhibit his sleep "fighting" behavior and the klonopin makes him too sedated.  He would like to stop taking it which is fine   He is taking metformin 500 mg BID - he wonders if he can skip this on the days that he flies in order to avoid risk of diarrhea- this is fine with me  Over the last week his blood glucose has looked great, not over 150  He does have a significant tremor which makes it hard for him to write legibly.  He has discussed this with his neurologist  Per neurology  note from March of this year: Assessment:  1. Diabetic polyneuropathy associated with type 2 diabetes mellitus ([place])  2. REM behavioral disorder   His gait imbalance is due to the neuropathy. There may a contribution from lumbar radiculopathy but he does not have any significant back pain. He has no features of Parkinsonism; e.g. No bradykinesia, no resting tremor, no cogwheel rigidity. His postural instability is related to neuropathy in feet and bilateral foot drop which is likely also due to neuropathy.  1. Start Clonazepam 0.5mg  QHS for REM behavior disorder  He also notes that often when he is laying down waiting to go to sleep, he may have memories of his childhood in [place].  Most of these memories are good and he has developed a strong urge to move home to [place]- his wife is not interested in this idea at all however.  He also finds that it can be hard for him to connect and make meaningful conversation with even close family members.  He wonders what he might be able to do about these worries   Patient Active Problem List   Diagnosis Date Noted  . Pelvic floor dysfunction 04/04/2018  . Change in bowel habits 04/04/2018  . OSA on CPAP 01/12/2018  . REM behavioral disorder 01/12/2018  . Diarrhea  09/15/2017  . Incontinence of feces 09/15/2017  . Peripheral neuropathy 10/20/2016  . Bladder outlet obstruction 05/02/2016  . OA (osteoarthritis) of knee 05/27/2015  . Diabetes mellitus type II, controlled ([place]) 11/26/2014  . Gastroesophageal reflux disease without esophagitis 11/26/2014  . Arthritis of both knees 11/26/2014  . Absence of bladder continence 11/26/2014  . Hyperlipidemia LDL goal <100 11/26/2014  . Acute medial meniscal tear 07/16/2014  . Lumbosacral spondylosis without myelopathy 02/17/2014  . Spondylosis, lumbosacral 02/17/2014    Past Medical History:  Diagnosis Date  . Arthritis    both knees  . Back pain at L4-L5 level   . Basal cell carcinoma   . BPH (benign  prostatic hyperplasia)    hx s/p turp  . Chronic back pain    spondylosis  . Chronic kidney disease    kidney stones 1979  . DDD (degenerative disc disease), lumbar   . Diabetes mellitus    takes Metformin and Victoza daily-under control  . Diarrhea    x 1 today  . Diverticulitis   . Gallstones   . GERD (gastroesophageal reflux disease)    takes Omeprazole daily-under control  . H/O hiatal hernia   . H/O measles   . Hepatitis B    B-with Mono and jaundice 1960  . History of chicken pox   . History of colon polyps   . History of kidney stones   . Hyperlipidemia    takes Simvastatin and Tricor daily-under control  . Hypertension    takes Lisinopril daily-under control  . Joint pain    both knees  . Joint swelling    rt knee  . Kidney stones   . Melanoma ([place])    right hand;basal cell carcinoma  . Neuropathy   . Peripheral vascular disease ([place])    diabetic neuropathy  . Pneumonia    hx of  . Prostate infection    currently taking macrobid  . Sleep apnea with use of continuous positive airway pressure (CPAP)   . Tinnitus   . Urinary incontinence   . UTI (lower urinary tract infection)     Past Surgical History:  Procedure Laterality Date  . BASAL CELL CARCINOMA EXCISION    . BOTOX INJECTION  03/22/2017   In the bladder for nightly incontinence.  . C5 and T1 bone chip removed   1986  . [place]  . CHOLECYSTECTOMY  1993  . COLONOSCOPY  2011  . cortisone injection    . EYE SURGERY     cataract sx 2017 bilateral  . I&D left knee Left 1958   states 22 times  . KNEE ARTHROSCOPY Right 07/16/2014   Procedure: RIGHT ARTHROSCOPY KNEE WITH Medial and Lateral DEBRIDEMENT and chondroplasty;  Surgeon: Gearlean Alf, MD;  Location: [facility];  Service: Orthopedics;  Laterality: Right;  . LUMBAR LAMINECTOMY/DECOMPRESSION MICRODISCECTOMY Right 02/17/2014   Procedure: RIGHT LUMBAR TWO-THREE LAMINECTOMY;  Surgeon: Charlie Pitter, MD;  Location: [place] [facility];   Service: Neurosurgery;  Laterality: Right;  right   . right shoulder surgery  2012   rotator cuff repair  . SHOULDER ARTHROSCOPY W/ ROTATOR CUFF REPAIR Left NOV 2010  . TONSILLECTOMY  1942  . TOTAL KNEE ARTHROPLASTY Right 05/27/2015   Procedure: RIGHT TOTAL KNEE ARTHROPLASTY;  Surgeon: Gaynelle Arabian, MD;  [facility]: [facility];  Service: Orthopedics;  Laterality: Right;  . TOTAL KNEE ARTHROPLASTY Left 05/30/2016   Procedure: LEFT TOTAL KNEE ARTHROPLASTY;  Surgeon: Gaynelle Arabian, MD;  [facility]: [facility];  Service: Orthopedics;  Laterality: Left;  . TRANSURETHRAL RESECTION OF PROSTATE  2006  . VASECTOMY  1983  . WISDOM TOOTH EXTRACTION      Social History   Tobacco Use  . Smoking status: Former Smoker    Types: Pipe    Last attempt to quit: 07/14/1978    Years since quitting: 40.1  . Smokeless tobacco: Never Used  . Tobacco comment: quit smoking 1979  Substance Use Topics  . Alcohol use: Yes    Comment: OCCASIONAL  . Drug use: No    Family History  Problem Relation Age of Onset  . Heart disease Mother 2       Deceased  . Bladder Cancer Mother   . Breast cancer Mother   . Prostate cancer Father        Deceased-in 47s  . Healthy Brother   . Skin cancer Brother        "basal cell cancer in the bloodstream"  . Asthma Sister        Deceased  . Healthy Son        #1  . Healthy Daughter        #2  . Obesity Son        #2    Allergies  Allergen Reactions  . Albamycin [Novobiocin] Other (See Comments)    Made me look like a strawberry   . Penicillins Rash and Other (See Comments)    Has patient had a PCN reaction causing immediate rash, facial/tongue/throat swelling, SOB or lightheadedness with hypotension:  no Has patient had a PCN reaction causing severe rash involving mucus membranes or skin necrosis: no Has patient had a PCN reaction that required hospitalization no Has patient had a PCN reaction occurring within the last 10 years: no - childhood reaction If all of the  above answers are "NO", then may proceed with Cephalosporin use.     Medication list has been reviewed and updated.  Current Outpatient Medications on File Prior to Visit  Medication Sig Dispense Refill  . aspirin EC 81 MG tablet Take 81 mg by mouth daily.    [name] Kitchen CALCIUM-MAGNESIUM-VITAMIN D PO Take 2 tablets by mouth 2 (two) times daily.     . cephALEXin (KEFLEX) 500 MG capsule Take 500 mg by mouth daily.    . clonazePAM (KLONOPIN) 0.5 MG tablet     . FIBER PO Take 2 capsules by mouth 2 (two) times daily.     [name] Kitchen glucose blood (FREESTYLE LITE) test strip USE AS INSTRUCTED TO TEST BLOOD GLUCOSE TWICE A DAY 200 each 12  . Lancets (FREESTYLE) lancets USE 1 LANCET TWICE A DAY AS INSTRUCTED 200 each 3  . lisinopril (PRINIVIL,ZESTRIL) 5 MG tablet TAKE 1 TABLET EVERY EVENING 90 tablet 3  . Melatonin 10 MG TABS Take 1 tablet by mouth at bedtime.    . metFORMIN (GLUCOPHAGE) 500 MG tablet Take 1 tablet (500 mg total) by mouth 2 (two) times daily with a meal. 180 tablet 3  . Multiple Vitamin (MULTI-VITAMINS) TABS Take 1 g by mouth daily.    [name] Kitchen omeprazole (PRILOSEC) 20 MG capsule TAKE 1 CAPSULE DAILY (Patient taking differently: TAKE 1 CAPSULE BY MOUTH TWICE DAILY) 90 capsule 3  . Polyethylene Glycol 3350 (MIRALAX PO) Take by mouth every other day.    . Probiotic Product (PROBIOTIC ADVANCED PO) Take by mouth.    . simvastatin (ZOCOR) 10 MG tablet TAKE 1 TABLET AT BEDTIME 90 tablet 1  . TRICOR 145 MG tablet TAKE  1 TABLET EVERY EVENING 90 tablet 3  . vitamin C (ASCORBIC ACID) 500 MG tablet Take 500 mg by mouth daily.     No current facility-administered medications on file prior to visit.     Review of Systems:  As per HPI- otherwise negative. No fever or chills No CP or SOB Some intermittent diarrhea but no vomiting  Physical Examination: Vitals:   08/23/18 1459  BP: 110/66  Pulse: 88  Temp: 98.3 F (36.8 C)  SpO2: 95%   Vitals:   08/23/18 1459  Weight: 224 lb (101.6 kg)  Height: 5\' 11"   (1.803 m)   Body mass index is 31.24 kg/m. Ideal Body Weight: Weight in (lb) to have BMI = 25: 178.9  GEN: WDWN, NAD, Non-toxic, A & O x 3, overweight, looks well  HEENT: Atraumatic, Normocephalic. Neck supple. No masses, No LAD.  Bilateral TM wnl, oropharynx normal.  PEERL,EOMI.   His voice is somewhat hoarse/ weak, may go in and out  Ears and Nose: No external deformity. CV: RRR, No M/G/R. No JVD. No thrill. No extra heart sounds. PULM: CTA B, no wheezes, crackles, rhonchi. No retractions. No resp. distress. No accessory muscle use. ABD: S, NT, ND, +BS. No rebound. No HSM. EXTR: No c/c/e NEURO Normal gait.  He uses bilateral AFO in his shoes  Foot exam today- he not able to senses monofil in either great toe.  I cannot palpate pulses  PSYCH: Normally interactive. Conversant. Not depressed or anxious appearing.  Calm demeanor.   BP Readings from Last 3 Encounters:  08/23/18 110/66  04/28/18 119/63  04/16/18 128/84    Assessment and Plan: Hyperlipidemia associated with type 2 diabetes mellitus ([place])  Essential hypertension  Controlled type 2 diabetes mellitus without complication, without long-term current use of insulin ([place]) - Plan: Hemoglobin A1c  Polyneuropathy associated with underlying disease ([place]) - Plan: Ambulatory referral to ENT  Immunization due - Plan: VAS [place] ABI WITH/WO TBI  Need for influenza vaccination - Plan: Flu vaccine HIGH DOSE PF (Fluzone High dose)  Following up today Wean off klonopin as not helping him ENT eval for persistent hoarseness Set up for ABIs for his feet once he gets home Went over counseling options with him today to discuss his concerns as above- he will think about doing this   Signed Lamar Blinks, MD  Results for orders placed or performed in visit on 08/23/18  Hemoglobin A1c  Result Value Ref Range   Hgb A1c MFr Bld 6.6 (H) 4.6 - 6.5 %

## 2018-08-23 NOTE — Patient Instructions (Addendum)
Flu shot today Have a wonderful time in [place]!   You can certainly hold the metformin for your flight, and take just one pill a day during your trip if you like   Let's taper you off clonazepam - take 1/2 pill daily for about 5 days, then a 1/2 pill every other day for a few doses and then stop   For your hoarse voice, I would recommend that we have you see ENT for an evaluation when you get back from [place]   I will give you some information about our counseling services - please look into this if you like  Finally, I don't feel good pulses in your feet.  I am going to set up a circulation test for you as well- for after you get home from your trip!     Please have your A1c drawn today

## 2018-08-24 ENCOUNTER — Encounter: Payer: Self-pay | Admitting: Family Medicine

## 2018-08-24 LAB — HEMOGLOBIN A1C: HEMOGLOBIN A1C: 6.6 % — AB (ref 4.6–6.5)

## 2018-08-27 ENCOUNTER — Ambulatory Visit (HOSPITAL_COMMUNITY)
Admission: RE | Admit: 2018-08-27 | Discharge: 2018-08-27 | Disposition: A | Discharge status: Home / Self Care | Payer: Medicare Other | Source: Ambulatory Visit | Attending: Surgery | Admitting: Surgery

## 2018-08-27 DIAGNOSIS — R0989 Other specified symptoms and signs involving the circulatory and respiratory systems: Secondary | ICD-10-CM | POA: Diagnosis not present

## 2018-08-27 DIAGNOSIS — Z23 Encounter for immunization: Secondary | ICD-10-CM | POA: Diagnosis not present

## 2018-08-27 DIAGNOSIS — E119 Type 2 diabetes mellitus without complications: Secondary | ICD-10-CM | POA: Diagnosis not present

## 2018-08-27 DIAGNOSIS — Z87891 Personal history of nicotine dependence: Secondary | ICD-10-CM | POA: Insufficient documentation

## 2018-08-28 ENCOUNTER — Encounter: Payer: Self-pay | Admitting: Family Medicine

## 2018-09-07 ENCOUNTER — Other Ambulatory Visit: Payer: Self-pay | Admitting: Family Medicine

## 2018-09-07 DIAGNOSIS — E119 Type 2 diabetes mellitus without complications: Secondary | ICD-10-CM

## 2018-09-24 ENCOUNTER — Encounter: Payer: Self-pay | Admitting: Family Medicine

## 2018-09-24 DIAGNOSIS — G4700 Insomnia, unspecified: Secondary | ICD-10-CM

## 2018-09-24 MED ORDER — CLONAZEPAM 0.5 MG PO TABS: ORAL_TABLET | ORAL | 0 refills | Status: DC

## 2018-09-24 NOTE — Telephone Encounter (Signed)
Reviewed N[facility]:  05/14/2018  1  02/12/2018  Clonazepam 0.5 Mg Tablet  90.00 90 Pa Bur  [id number]  Esi ([id number])  1/1 1.00 LME Comm Ins  [place]  02/15/2018  1  02/12/2018  Clonazepam 0.5 Mg Tablet  90.00 90 Pa Bur  [id number]  Esi ([id number])  0/1 1.00 LME Comm Ins  [place]  10/30/2017  1  10/30/2017  Clonazepam 0.5 Mg Tablet  30.00 30 Wi Mar  [id number]  [place] ([id number])        He has been getting his clonazepam from another provider but as his PCP I am ok with writing for this

## 2018-10-09 DIAGNOSIS — R490 Dysphonia: Secondary | ICD-10-CM | POA: Diagnosis not present

## 2018-10-09 DIAGNOSIS — J310 Chronic rhinitis: Secondary | ICD-10-CM | POA: Diagnosis not present

## 2018-10-09 DIAGNOSIS — J382 Nodules of vocal cords: Secondary | ICD-10-CM | POA: Diagnosis not present

## 2018-10-15 ENCOUNTER — Other Ambulatory Visit: Payer: Self-pay

## 2018-10-15 MED ORDER — SIMVASTATIN 10 MG PO TABS: 10.0000 mg | ORAL_TABLET | Freq: Every day | ORAL | 1 refills | Status: DC

## 2018-10-18 ENCOUNTER — Telehealth: Payer: Self-pay | Admitting: Pulmonary Disease

## 2018-10-18 NOTE — Telephone Encounter (Signed)
Called and spoke with patient, he stated that he was needing new CPAP supplies and when he called [facility] to get them they stated that he could not get new supplies.   Called Corene Cornea with [facility] he stated that the patient is in a medicare audit and once it is over they can get his stuff taken care of.  Called and spoke with patient, advised him of above. Patient stated that he has a dream wear mask. Advised patient that per JJ we can give him one of our masks to get him through until he can get new supplies from [facility]. Patient will be here tomorrow morning to pick up new mask. Nothing further needed. Mask placed up front.

## 2018-10-19 ENCOUNTER — Telehealth: Payer: Self-pay | Admitting: Pulmonary Disease

## 2018-10-19 ENCOUNTER — Encounter: Payer: Self-pay | Admitting: Family Medicine

## 2018-10-19 NOTE — Telephone Encounter (Signed)
Called and spoke with patient he is aware and verbalized understanding. Nothing further needed.  

## 2018-10-24 ENCOUNTER — Ambulatory Visit (INDEPENDENT_AMBULATORY_CARE_PROVIDER_SITE_OTHER): Payer: Medicare Other | Admitting: Internal Medicine

## 2018-10-24 ENCOUNTER — Encounter: Payer: Self-pay | Admitting: Internal Medicine

## 2018-10-24 VITALS — BP 140/80 | HR 80 | Ht 69.0 in | Wt 230.5 lb

## 2018-10-24 DIAGNOSIS — R194 Change in bowel habit: Secondary | ICD-10-CM

## 2018-10-24 DIAGNOSIS — R159 Full incontinence of feces: Secondary | ICD-10-CM

## 2018-10-24 DIAGNOSIS — R197 Diarrhea, unspecified: Secondary | ICD-10-CM | POA: Diagnosis not present

## 2018-10-24 NOTE — Progress Notes (Signed)
HISTORY OF PRESENT ILLNESS:  Matthew Nash is a 82 y.o. male with multiple significant medical problems and multiple prior surgeries who presents today regarding episodes of fecal incontinence.  The patient was evaluated last year regarding complaints of loose stools and fecal incontinence.  He underwent complete colonoscopy September 20, 2017.  Examination revealed a diminutive adenoma and severe pandiverticulosis.  Internal hemorrhoids present.  Random colon biopsies were normal.  Of importance the patient had a complete absence of rectal tone.  He tells me that he saw my physician assistant earlier this year regarding problems with his bowel habits and incontinence.  I have reviewed that report.  Tells me that he has had no issues since that time until October 3 through October 10 when he was traveling to [place].  He is accompanied by his wife today does not corroborate all of his history as he may have more issues than he reports.  In any event, he is more apt to have problems when his stools are soft.  Has not been using protective undergarments regularly.  Taking a couple fiber capsules daily.  He is distraught over his issues.  He tells me that he came off of all of his medications but still had troubles.  Patient has great difficulty getting up from a chair due to his back problems and weak lower extremities.  With Valsalva maneuvers he may have worsening symptoms.  Sometimes has trouble discriminating between gas and feces  REVIEW OF SYSTEMS:  All non-GI ROS negative less otherwise stated in the HPI except for arthritis, hearing problems, voice change  Past Medical History:  Diagnosis Date  . Arthritis    both knees  . Back pain at L4-L5 level   . Basal cell carcinoma   . BPH (benign prostatic hyperplasia)    hx s/p turp  . Chronic back pain    spondylosis  . Chronic kidney disease    kidney stones 1979  . DDD (degenerative disc disease), lumbar   . Diabetes mellitus    takes Metformin  and Victoza daily-under control  . Diarrhea    x 1 today  . Diverticulitis   . Gallstones   . GERD (gastroesophageal reflux disease)    takes Omeprazole daily-under control  . H/O hiatal hernia   . H/O measles   . Hepatitis B    B-with Mono and jaundice 1960  . History of chicken pox   . History of colon polyps   . History of kidney stones   . Hyperlipidemia    takes Simvastatin and Tricor daily-under control  . Hypertension    takes Lisinopril daily-under control  . Joint pain    both knees  . Joint swelling    rt knee  . Kidney stones   . Melanoma ([place])    right hand;basal cell carcinoma  . Neuropathy   . Peripheral vascular disease ([place])    diabetic neuropathy  . Pneumonia    hx of  . Prostate infection    currently taking macrobid  . Sleep apnea with use of continuous positive airway pressure (CPAP)   . Tinnitus   . Urinary incontinence   . UTI (lower urinary tract infection)     Past Surgical History:  Procedure Laterality Date  . BASAL CELL CARCINOMA EXCISION    . BOTOX INJECTION  03/22/2017   In the bladder for nightly incontinence.  . C5 and T1 bone chip removed   1986  . [place]  .  CHOLECYSTECTOMY  1993  . COLONOSCOPY  2011  . cortisone injection    . EYE SURGERY     cataract sx 2017 bilateral  . I&D left knee Left 1958   states 22 times  . KNEE ARTHROSCOPY Right 07/16/2014   Procedure: RIGHT ARTHROSCOPY KNEE WITH Medial and Lateral DEBRIDEMENT and chondroplasty;  Surgeon: Gearlean Alf, MD;  [facility];  Service: Orthopedics;  Laterality: Right;  . LUMBAR LAMINECTOMY/DECOMPRESSION MICRODISCECTOMY Right 02/17/2014   Procedure: RIGHT LUMBAR TWO-THREE LAMINECTOMY;  Surgeon: Charlie Pitter, MD;  Location: [place] [facility];  Service: Neurosurgery;  Laterality: Right;  right   . right shoulder surgery  2012   rotator cuff repair  . SHOULDER ARTHROSCOPY W/ ROTATOR CUFF REPAIR Left NOV 2010  . TONSILLECTOMY  1942  . TOTAL KNEE  ARTHROPLASTY Right 05/27/2015   Procedure: RIGHT TOTAL KNEE ARTHROPLASTY;  Surgeon: Gaynelle Arabian, MD;  [facility];  Service: Orthopedics;  Laterality: Right;  . TOTAL KNEE ARTHROPLASTY Left 05/30/2016   Procedure: LEFT TOTAL KNEE ARTHROPLASTY;  Surgeon: Gaynelle Arabian, MD;  [facility];  Service: Orthopedics;  Laterality: Left;  . TRANSURETHRAL RESECTION OF PROSTATE  2006  . VASECTOMY  1983  . WISDOM TOOTH EXTRACTION      Social History ASHOK SAWAYA  reports that he quit smoking about 40 years ago. His smoking use included pipe. He has never used smokeless tobacco. He reports that he drinks alcohol. He reports that he does not use drugs.  family history includes Asthma in his sister; Bladder Cancer in his mother; Breast cancer in his mother; Healthy in his brother, daughter, and son; Heart disease (age of onset: 8) in his mother; Obesity in his son; Prostate cancer in his father; Skin cancer in his brother.  Allergies  Allergen Reactions  . Albamycin [Novobiocin] Other (See Comments)    Made me look like a strawberry   . Penicillins Rash and Other (See Comments)    Has patient had a PCN reaction causing immediate rash, facial/tongue/throat swelling, SOB or lightheadedness with hypotension:  no Has patient had a PCN reaction causing severe rash involving mucus membranes or skin necrosis: no Has patient had a PCN reaction that required hospitalization no Has patient had a PCN reaction occurring within the last 10 years: no - childhood reaction If all of the above answers are "NO", then may proceed with Cephalosporin use.        PHYSICAL EXAMINATION: Vital signs: BP 140/80 (BP Location: Left Arm, Patient Position: Sitting, Cuff Size: Normal)   Pulse 80   Ht 5\' 9"  (1.753 m)   Wt 230 lb 8 oz (104.6 kg)   BMI 34.04 kg/m   Constitutional: Elderly, chronically ill-appearing, no acute distress Psychiatric: alert and oriented x3, cooperative Abdomen: Not  reexamined  ASSESSMENT:  1.  Fecal incontinence 2.  Absent rectal tone 3.  Recent colonoscopy October 2010 as described 4.  Multiple significant medical problems and multiple prior surgeries   PLAN:  1.  In an attempt to improve his bowel consistency I recommend Metamucil 2 tablespoons daily 2.  Wear protective undergarments at all times 3.  I will refer him to colorectal surgeon Dr. Nadeen Landau to see if he is a potential candidate for more invasive therapies regarding his incontinence  25-minute spent face-to-face with the patient.  Greater than 50% the time used for counseling regarding his issues with incontinence and answering multiple questions from he and his wife

## 2018-10-24 NOTE — Patient Instructions (Signed)
You will be contacted by [facility] to schedule an appointment for a consultation

## 2018-10-30 NOTE — Progress Notes (Signed)
[place] at [facility] [address], [place], [place] [address] [phone]  Date:  10/31/2018   Name:  Matthew Nash   DOB:  01-Oct-1936   MRN:  [id number]  PCP:  Darreld Mclean, MD    Chief Complaint: Bowel Issues (IBS?, "fecal incontinence" )   History of Present Illness:  Matthew Nash is a 82 y.o. very pleasant male patient who presents with the following:  Here today with concern about his bowels History of DM, OSA, sleep disorder, OA, hyperlipidemia Seen recently by his GI doc, Henrene Pastor- surgeries who presents today regarding episodes of fecal incontinence.  The patient was evaluated last year regarding complaints of loose stools and fecal incontinence.  He underwent complete colonoscopy September 20, 2017.  Examination revealed a diminutive adenoma and severe pandiverticulosis.  Internal hemorrhoids present.  Random colon biopsies were normal.  Of importance the patient had a complete absence of rectal tone.  He tells me that he saw my physician assistant earlier this year regarding problems with his bowel habits and incontinence.  I have reviewed that report.  Tells me that he has had no issues since that time until October 3 through October 10 when he was traveling to [place].  He is accompanied by his wife today does not corroborate all of his history as he may have more issues than he reports.  In any event, he is more apt to have problems when his stools are soft.  Has not been using protective undergarments regularly.  Taking a couple fiber capsules daily.  He is distraught over his issues.  He tells me that he came off of all of his medications but still had troubles.  Patient has great difficulty getting up from a chair due to his back problems and weak lower extremities.  With Valsalva maneuvers he may have worsening symptoms.  Sometimes has trouble discriminating between gas and  feces///////////////////////////////////////// ASSESSMENT: 1.  Fecal incontinence 2.  Absent rectal tone 3.  Recent colonoscopy October 2010 as described 4.  Multiple significant medical problems and multiple prior surgeries PLAN: 1.  In an attempt to improve his bowel consistency I recommend Metamucil 2 tablespoons daily 2.  Wear protective undergarments at all times 3.  I will refer him to colorectal surgeon Dr. Nadeen Landau to see if he is a potential candidate for more invasive therapies regarding his incontinence   Labs: due for full panel would he like PSA testing-he is seeing DR. Mullins next week, will defer to him  Lab Results  Component Value Date   HGBA1C 6.6 (H) 08/23/2018   Here today to discuss his fecal urgency and incontinence today He is still taking metformin but will hold this for any travel or other situation where it might provoke diarrhea Accompanied by his wife today He describes fecal incontinence first in 2016, getting worse over time  He had his gallbladder out in the 1990s. He recently went to [place] and the flight was terrible - he had fecal incontinence on the plane. He was not able to get out of his clothes fast enough and soiled his clothing.   He did hold his metformin the morning of the flight.  He is taking metformin 500 BID generally  The rest of the trip was also complicated by his GI symptoms which he describes in great detail today Eating certainly seems to trigger his sx   In the end, he would like to  get a 2nd opinion from GI which is certainly ok We will also get routine labs and an A1c today If possible plan to stop metformin as it may be worsening his sx  No weight loss Wt Readings from Last 3 Encounters:  10/31/18 227 lb (103 kg)  10/24/18 230 lb 8 oz (104.6 kg)  08/23/18 224 lb (101.6 kg)    Patient Active Problem List   Diagnosis Date Noted  . Pelvic floor dysfunction 04/04/2018  . Change in bowel habits 04/04/2018  . OSA  on CPAP 01/12/2018  . REM behavioral disorder 01/12/2018  . Diarrhea 09/15/2017  . Incontinence of feces 09/15/2017  . Peripheral neuropathy 10/20/2016  . Bladder outlet obstruction 05/02/2016  . OA (osteoarthritis) of knee 05/27/2015  . Diabetes mellitus type II, controlled ([facility]) 11/26/2014  . Gastroesophageal reflux disease without esophagitis 11/26/2014  . Arthritis of both knees 11/26/2014  . Absence of bladder continence 11/26/2014  . Hyperlipidemia LDL goal <100 11/26/2014  . Acute medial meniscal tear 07/16/2014  . Lumbosacral spondylosis without myelopathy 02/17/2014  . Spondylosis, lumbosacral 02/17/2014    Past Medical History:  Diagnosis Date  . Arthritis    both knees  . Back pain at L4-L5 level   . Basal cell carcinoma   . BPH (benign prostatic hyperplasia)    hx s/p turp  . Chronic back pain    spondylosis  . Chronic kidney disease    kidney stones 1979  . DDD (degenerative disc disease), lumbar   . Diabetes mellitus    takes Metformin and Victoza daily-under control  . Diarrhea    x 1 today  . Diverticulitis   . Gallstones   . GERD (gastroesophageal reflux disease)    takes Omeprazole daily-under control  . H/O hiatal hernia   . H/O measles   . Hepatitis B    B-with Mono and jaundice 1960  . History of chicken pox   . History of colon polyps   . History of kidney stones   . Hyperlipidemia    takes Simvastatin and Tricor daily-under control  . Hypertension    takes Lisinopril daily-under control  . Joint pain    both knees  . Joint swelling    rt knee  . Kidney stones   . Melanoma ([place])    right hand;basal cell carcinoma  . Neuropathy   . Peripheral vascular disease ([facility])    diabetic neuropathy  . Pneumonia    hx of  . Prostate infection    currently taking macrobid  . Sleep apnea with use of continuous positive airway pressure (CPAP)   . Tinnitus   . Urinary incontinence   . UTI (lower urinary tract infection)     Past Surgical  History:  Procedure Laterality Date  . BASAL CELL CARCINOMA EXCISION    . BOTOX INJECTION  03/22/2017   In the bladder for nightly incontinence.  . C5 and T1 bone chip removed   1986  . [place]  . CHOLECYSTECTOMY  1993  . COLONOSCOPY  2011  . cortisone injection    . EYE SURGERY     cataract sx 2017 bilateral  . I&D left knee Left 1958   states 22 times  . KNEE ARTHROSCOPY Right 07/16/2014   Procedure: RIGHT ARTHROSCOPY KNEE WITH Medial and Lateral DEBRIDEMENT and chondroplasty;  Surgeon: Gearlean Alf, MD;  Location: [facility];  Service: Orthopedics;  Laterality: Right;  . LUMBAR LAMINECTOMY/DECOMPRESSION MICRODISCECTOMY Right 02/17/2014   Procedure: RIGHT  LUMBAR TWO-THREE LAMINECTOMY;  Surgeon: Charlie Pitter, MD;  Location: [facility];  Service: Neurosurgery;  Laterality: Right;  right   . right shoulder surgery  2012   rotator cuff repair  . SHOULDER ARTHROSCOPY W/ ROTATOR CUFF REPAIR Left NOV 2010  . TONSILLECTOMY  1942  . TOTAL KNEE ARTHROPLASTY Right 05/27/2015   Procedure: RIGHT TOTAL KNEE ARTHROPLASTY;  Surgeon: Gaynelle Arabian, MD;  Location: [facility];  Service: Orthopedics;  Laterality: Right;  . TOTAL KNEE ARTHROPLASTY Left 05/30/2016   Procedure: LEFT TOTAL KNEE ARTHROPLASTY;  Surgeon: Gaynelle Arabian, MD;  Location: [facility];  Service: Orthopedics;  Laterality: Left;  . TRANSURETHRAL RESECTION OF PROSTATE  2006  . VASECTOMY  1983  . WISDOM TOOTH EXTRACTION      Social History   Tobacco Use  . Smoking status: Former Smoker    Types: Pipe    Last attempt to quit: 07/14/1978    Years since quitting: 40.3  . Smokeless tobacco: Never Used  . Tobacco comment: quit smoking 1979  Substance Use Topics  . Alcohol use: Yes    Comment: OCCASIONAL  . Drug use: No    Family History  Problem Relation Age of Onset  . Heart disease Mother 80       Deceased  . Bladder Cancer Mother   . Breast cancer Mother   . Prostate cancer Father        Deceased-in 53s  .  Healthy Brother   . Skin cancer Brother        "basal cell cancer in the bloodstream"  . Asthma Sister        Deceased  . Healthy Son        #1  . Healthy Daughter        #2  . Obesity Son        #2    Allergies  Allergen Reactions  . Albamycin [Novobiocin] Other (See Comments)    Made me look like a strawberry   . Penicillins Rash and Other (See Comments)    Has patient had a PCN reaction causing immediate rash, facial/tongue/throat swelling, SOB or lightheadedness with hypotension:  no Has patient had a PCN reaction causing severe rash involving mucus membranes or skin necrosis: no Has patient had a PCN reaction that required hospitalization no Has patient had a PCN reaction occurring within the last 10 years: no - childhood reaction If all of the above answers are "NO", then may proceed with Cephalosporin use.     Medication list has been reviewed and updated.  Current Outpatient Medications on File Prior to Visit  Medication Sig Dispense Refill  . aspirin EC 81 MG tablet Take 81 mg by mouth daily.    [name] Kitchen CALCIUM-MAGNESIUM-VITAMIN D PO Take 2 tablets by mouth 2 (two) times daily.     . cephALEXin (KEFLEX) 500 MG capsule Take 500 mg by mouth daily.    . clonazePAM (KLONOPIN) 0.5 MG tablet Take 1/2 or 1 qhs prn sleep 90 tablet 0  . FIBER PO Take 2 capsules by mouth 2 (two) times daily.     [name] Kitchen glucose blood (FREESTYLE LITE) test strip USE AS INSTRUCTED TO TEST BLOOD GLUCOSE TWICE A DAY 200 each 12  . Lancets (FREESTYLE) lancets USE 1 LANCET TWICE A DAY AS INSTRUCTED 200 each 3  . lisinopril (PRINIVIL,ZESTRIL) 5 MG tablet TAKE 1 TABLET EVERY EVENING 90 tablet 3  . Melatonin 10 MG TABS Take 1 tablet by mouth at bedtime.    [name] Kitchen  metFORMIN (GLUCOPHAGE) 500 MG tablet TAKE 1 TABLET TWICE A DAY WITH MEALS 180 tablet 4  . Multiple Vitamin (MULTI-VITAMINS) TABS Take 1 g by mouth daily.    [name] Kitchen omeprazole (PRILOSEC) 20 MG capsule TAKE 1 CAPSULE DAILY (Patient taking differently: TAKE 1 CAPSULE  BY MOUTH TWICE DAILY) 90 capsule 3  . Polyethylene Glycol 3350 (MIRALAX PO) Take by mouth as needed.     . Probiotic Product (PROBIOTIC ADVANCED PO) Take by mouth.    . simvastatin (ZOCOR) 10 MG tablet Take 1 tablet (10 mg total) by mouth at bedtime. 90 tablet 1  . TRICOR 145 MG tablet TAKE 1 TABLET EVERY EVENING 90 tablet 3  . vitamin C (ASCORBIC ACID) 500 MG tablet Take 500 mg by mouth daily.     No current facility-administered medications on file prior to visit.     Review of Systems:  As per HPI- otherwise negative.   Physical Examination: Vitals:   10/31/18 1354  BP: 140/70  Pulse: 92  Resp: 18  Temp: (!) 97.5 F (36.4 C)  SpO2: 95%   Vitals:   10/31/18 1354  Weight: 227 lb (103 kg)  Height: 5\' 9"  (1.753 m)   Body mass index is 33.52 kg/m. Ideal Body Weight: Weight in (lb) to have BMI = 25: 168.9  GEN: WDWN, NAD, Non-toxic, A & O x 3, looks well, obese  HEENT: Atraumatic, Normocephalic. Neck supple. No masses, No LAD.  Bilateral TM wnl, oropharynx normal.  PEERL,EOMI.   Ears and Nose: No external deformity. CV: RRR, No M/G/R. No JVD. No thrill. No extra heart sounds. PULM: CTA B, no wheezes, crackles, rhonchi. No retractions. No resp. distress. No accessory muscle use. ABD: S, NT, ND, +BS. No rebound. No HSM.  Benign belly  EXTR: No c/c/e NEURO Normal gait.  PSYCH: Normally interactive. Conversant. Not depressed or anxious appearing.  Calm demeanor.    Assessment and Plan: Hyperlipidemia associated with type 2 diabetes mellitus ([facility]) - Plan: Lipid panel  Essential hypertension - Plan: CBC, Comprehensive metabolic panel  Controlled type 2 diabetes mellitus without complication, without long-term current use of insulin ([facility]) - Plan: Comprehensive metabolic panel, Hemoglobin A1c  Nocturnal enuresis  Functional diarrhea - Plan: Ambulatory referral to Gastroenterology  Full incontinence of feces - Plan: Ambulatory referral to Gastroenterology  Following up  regarding fecal incontinence today.  He has seen GI and been referred to a surgeon for an opinion. It sounds like he has lack of control over his gastrocolic reflex and absent rectal tone resulting in incontinence.  Have tried to explain this theory to him today.  He wonders if he has a particular food intolerance which seems less liekly, but certainly could be evaluated  Referral to see Dr. Benson Norway for a second opinion Labs pending as above    Signed Lamar Blinks, MD   Received his labs, message to pt  Results for orders placed or performed in visit on 10/31/18  CBC  Result Value Ref Range   WBC 6.1 4.0 - 10.5 K/uL   RBC 4.75 4.22 - 5.81 Mil/uL   Platelets 256.0 150.0 - 400.0 K/uL   Hemoglobin 13.2 13.0 - 17.0 g/dL   HCT 40.1 39.0 - 52.0 %   MCV 84.4 78.0 - 100.0 fl   MCHC 33.0 30.0 - 36.0 g/dL   RDW 14.3 11.5 - 15.5 %  Comprehensive metabolic panel  Result Value Ref Range   Sodium 141 135 - 145 mEq/L   Potassium 4.5 3.5 - 5.1  mEq/L   Chloride 105 96 - 112 mEq/L   CO2 29 19 - 32 mEq/L   Glucose, Bld 118 (H) 70 - 99 mg/dL   BUN 20 6 - 23 mg/dL   Creatinine, Ser 1.00 0.40 - 1.50 mg/dL   Total Bilirubin 0.7 0.2 - 1.2 mg/dL   Alkaline Phosphatase 41 39 - 117 U/L   AST 11 0 - 37 U/L   ALT 13 0 - 53 U/L   Total Protein 6.8 6.0 - 8.3 g/dL   Albumin 4.3 3.5 - 5.2 g/dL   Calcium 10.3 8.4 - 10.5 mg/dL   GFR 76.31 >60.00 mL/min  Lipid panel  Result Value Ref Range   Cholesterol 117 0 - 200 mg/dL   Triglycerides 138.0 0.0 - 149.0 mg/dL   HDL 44.60 >39.00 mg/dL   VLDL 27.6 0.0 - 40.0 mg/dL   LDL Cholesterol 45 0 - 99 mg/dL   Total CHOL/HDL Ratio 3    NonHDL 72.12   Hemoglobin A1c  Result Value Ref Range   Hgb A1c MFr Bld 6.8 (H) 4.6 - 6.5 %   Your blood counts and metabolic profile are normal Cholesterol is very good Your A1c is well under control- "at your age" we can afford to let this number come up some.  Why don't you stop the metformin totally and see if this helps  your situation at all.   If you could check your blood sugar on occasion to watch for any surprise it would be helpful.  Otherwise let's plan to visit and repeat an A1c in about 3 months I put in a referral to Dr. Benson Norway, GI for you.  Please let me know if you don't hear anything about this appt soon

## 2018-10-31 ENCOUNTER — Encounter: Payer: Self-pay | Admitting: Family Medicine

## 2018-10-31 ENCOUNTER — Other Ambulatory Visit: Payer: Self-pay | Admitting: Family Medicine

## 2018-10-31 ENCOUNTER — Ambulatory Visit (INDEPENDENT_AMBULATORY_CARE_PROVIDER_SITE_OTHER): Payer: Medicare Other | Admitting: Family Medicine

## 2018-10-31 VITALS — BP 140/70 | HR 92 | Temp 97.5°F | Resp 18 | Ht 69.0 in | Wt 227.0 lb

## 2018-10-31 DIAGNOSIS — E1169 Type 2 diabetes mellitus with other specified complication: Secondary | ICD-10-CM | POA: Diagnosis not present

## 2018-10-31 DIAGNOSIS — I10 Essential (primary) hypertension: Secondary | ICD-10-CM

## 2018-10-31 DIAGNOSIS — N3944 Nocturnal enuresis: Secondary | ICD-10-CM | POA: Diagnosis not present

## 2018-10-31 DIAGNOSIS — R159 Full incontinence of feces: Secondary | ICD-10-CM | POA: Diagnosis not present

## 2018-10-31 DIAGNOSIS — E785 Hyperlipidemia, unspecified: Secondary | ICD-10-CM

## 2018-10-31 DIAGNOSIS — K591 Functional diarrhea: Secondary | ICD-10-CM

## 2018-10-31 DIAGNOSIS — E119 Type 2 diabetes mellitus without complications: Secondary | ICD-10-CM

## 2018-10-31 LAB — LIPID PANEL
CHOL/HDL RATIO: 3
Cholesterol: 117 mg/dL (ref 0–200)
HDL: 44.6 mg/dL (ref >39.00)
LDL CALC: 45 mg/dL (ref 0–99)
NonHDL: 72.12
TRIGLYCERIDES: 138 mg/dL (ref 0.0–149.0)
VLDL: 27.6 mg/dL (ref 0.0–40.0)

## 2018-10-31 LAB — CBC
HEMATOCRIT: 40.1 % (ref 39.0–52.0)
Hemoglobin: 13.2 g/dL (ref 13.0–17.0)
MCHC: 33 g/dL (ref 30.0–36.0)
MCV: 84.4 fl (ref 78.0–100.0)
Platelets: 256 10*3/uL (ref 150.0–400.0)
RBC: 4.75 Mil/uL (ref 4.22–5.81)
RDW: 14.3 % (ref 11.5–15.5)
WBC: 6.1 10*3/uL (ref 4.0–10.5)

## 2018-10-31 LAB — COMPREHENSIVE METABOLIC PANEL
ALBUMIN: 4.3 g/dL (ref 3.5–5.2)
ALT: 13 U/L (ref 0–53)
AST: 11 U/L (ref 0–37)
Alkaline Phosphatase: 41 U/L (ref 39–117)
BUN: 20 mg/dL (ref 6–23)
CHLORIDE: 105 meq/L (ref 96–112)
CO2: 29 meq/L (ref 19–32)
CREATININE: 1 mg/dL (ref 0.40–1.50)
Calcium: 10.3 mg/dL (ref 8.4–10.5)
GFR: 76.31 mL/min (ref >60.00)
Glucose, Bld: 118 mg/dL — ABNORMAL HIGH (ref 70–99)
POTASSIUM: 4.5 meq/L (ref 3.5–5.1)
SODIUM: 141 meq/L (ref 135–145)
Total Bilirubin: 0.7 mg/dL (ref 0.2–1.2)
Total Protein: 6.8 g/dL (ref 6.0–8.3)

## 2018-10-31 LAB — HEMOGLOBIN A1C: Hgb A1c MFr Bld: 6.8 % — ABNORMAL HIGH (ref 4.6–6.5)

## 2018-10-31 NOTE — Patient Instructions (Addendum)
We can get you a 2nd opinion from GI- I will have you see Dr. Benson Norway with [facility]  Stop taking the metformin entirely- I don't think you need it and it may be worsening your GI symptoms Check your sugar a couple of times a week for a month or so after stopping the metformin to watch for any surprises- Fasting sugar less than 150 Non fasting sugar less than 200- 250  Let me know if you don't hear form Dr. Ulyses Amor office in a week or so,  I'll be in touch with your labs!

## 2018-11-01 DIAGNOSIS — R32 Unspecified urinary incontinence: Secondary | ICD-10-CM | POA: Diagnosis not present

## 2018-11-20 ENCOUNTER — Encounter: Payer: Self-pay | Admitting: Family Medicine

## 2018-11-25 ENCOUNTER — Other Ambulatory Visit: Payer: Self-pay

## 2018-11-25 ENCOUNTER — Observation Stay (HOSPITAL_BASED_OUTPATIENT_CLINIC_OR_DEPARTMENT_OTHER)
Admission: EM | Admit: 2018-11-25 | Discharge: 2018-11-26 | Disposition: A | Discharge status: Home / Self Care | Payer: Medicare Other | Attending: Internal Medicine | Admitting: Internal Medicine

## 2018-11-25 ENCOUNTER — Encounter (HOSPITAL_BASED_OUTPATIENT_CLINIC_OR_DEPARTMENT_OTHER): Payer: Self-pay | Admitting: Emergency Medicine

## 2018-11-25 ENCOUNTER — Emergency Department (HOSPITAL_BASED_OUTPATIENT_CLINIC_OR_DEPARTMENT_OTHER): Payer: Medicare Other

## 2018-11-25 DIAGNOSIS — G8929 Other chronic pain: Secondary | ICD-10-CM | POA: Insufficient documentation

## 2018-11-25 DIAGNOSIS — N189 Chronic kidney disease, unspecified: Secondary | ICD-10-CM | POA: Diagnosis not present

## 2018-11-25 DIAGNOSIS — R0789 Other chest pain: Secondary | ICD-10-CM

## 2018-11-25 DIAGNOSIS — Z87891 Personal history of nicotine dependence: Secondary | ICD-10-CM | POA: Insufficient documentation

## 2018-11-25 DIAGNOSIS — M5136 Other intervertebral disc degeneration, lumbar region: Secondary | ICD-10-CM | POA: Diagnosis not present

## 2018-11-25 DIAGNOSIS — R7989 Other specified abnormal findings of blood chemistry: Secondary | ICD-10-CM | POA: Diagnosis not present

## 2018-11-25 DIAGNOSIS — Z85820 Personal history of malignant melanoma of skin: Secondary | ICD-10-CM | POA: Diagnosis not present

## 2018-11-25 DIAGNOSIS — K219 Gastro-esophageal reflux disease without esophagitis: Secondary | ICD-10-CM | POA: Diagnosis present

## 2018-11-25 DIAGNOSIS — I071 Rheumatic tricuspid insufficiency: Secondary | ICD-10-CM | POA: Insufficient documentation

## 2018-11-25 DIAGNOSIS — M47817 Spondylosis without myelopathy or radiculopathy, lumbosacral region: Secondary | ICD-10-CM | POA: Diagnosis present

## 2018-11-25 DIAGNOSIS — E1140 Type 2 diabetes mellitus with diabetic neuropathy, unspecified: Secondary | ICD-10-CM | POA: Diagnosis not present

## 2018-11-25 DIAGNOSIS — I712 Thoracic aortic aneurysm, without rupture, unspecified: Secondary | ICD-10-CM

## 2018-11-25 DIAGNOSIS — R079 Chest pain, unspecified: Secondary | ICD-10-CM | POA: Diagnosis present

## 2018-11-25 DIAGNOSIS — Z79899 Other long term (current) drug therapy: Secondary | ICD-10-CM | POA: Diagnosis not present

## 2018-11-25 DIAGNOSIS — R911 Solitary pulmonary nodule: Secondary | ICD-10-CM | POA: Insufficient documentation

## 2018-11-25 DIAGNOSIS — J9811 Atelectasis: Secondary | ICD-10-CM | POA: Insufficient documentation

## 2018-11-25 DIAGNOSIS — E1122 Type 2 diabetes mellitus with diabetic chronic kidney disease: Secondary | ICD-10-CM | POA: Diagnosis not present

## 2018-11-25 DIAGNOSIS — I129 Hypertensive chronic kidney disease with stage 1 through stage 4 chronic kidney disease, or unspecified chronic kidney disease: Secondary | ICD-10-CM | POA: Diagnosis not present

## 2018-11-25 DIAGNOSIS — N400 Enlarged prostate without lower urinary tract symptoms: Secondary | ICD-10-CM | POA: Insufficient documentation

## 2018-11-25 DIAGNOSIS — Z87442 Personal history of urinary calculi: Secondary | ICD-10-CM | POA: Insufficient documentation

## 2018-11-25 DIAGNOSIS — Z8249 Family history of ischemic heart disease and other diseases of the circulatory system: Secondary | ICD-10-CM | POA: Diagnosis not present

## 2018-11-25 DIAGNOSIS — D3501 Benign neoplasm of right adrenal gland: Secondary | ICD-10-CM | POA: Insufficient documentation

## 2018-11-25 DIAGNOSIS — E1151 Type 2 diabetes mellitus with diabetic peripheral angiopathy without gangrene: Secondary | ICD-10-CM | POA: Diagnosis not present

## 2018-11-25 DIAGNOSIS — E785 Hyperlipidemia, unspecified: Secondary | ICD-10-CM | POA: Insufficient documentation

## 2018-11-25 DIAGNOSIS — E119 Type 2 diabetes mellitus without complications: Secondary | ICD-10-CM

## 2018-11-25 DIAGNOSIS — I10 Essential (primary) hypertension: Secondary | ICD-10-CM | POA: Insufficient documentation

## 2018-11-25 DIAGNOSIS — R0602 Shortness of breath: Secondary | ICD-10-CM | POA: Diagnosis not present

## 2018-11-25 DIAGNOSIS — Z7982 Long term (current) use of aspirin: Secondary | ICD-10-CM | POA: Diagnosis not present

## 2018-11-25 DIAGNOSIS — I209 Angina pectoris, unspecified: Principal | ICD-10-CM | POA: Diagnosis present

## 2018-11-25 DIAGNOSIS — Z9989 Dependence on other enabling machines and devices: Secondary | ICD-10-CM

## 2018-11-25 DIAGNOSIS — Z7984 Long term (current) use of oral hypoglycemic drugs: Secondary | ICD-10-CM | POA: Diagnosis not present

## 2018-11-25 DIAGNOSIS — G4733 Obstructive sleep apnea (adult) (pediatric): Secondary | ICD-10-CM | POA: Diagnosis not present

## 2018-11-25 DIAGNOSIS — I313 Pericardial effusion (noninflammatory): Secondary | ICD-10-CM | POA: Insufficient documentation

## 2018-11-25 DIAGNOSIS — I700 Atherosclerosis of aorta: Secondary | ICD-10-CM | POA: Insufficient documentation

## 2018-11-25 LAB — TROPONIN I
Troponin I: <0.03 ng/mL (ref <0.03)
Troponin I: <0.03 ng/mL (ref <0.03)

## 2018-11-25 LAB — CBC
HCT: 42.7 % (ref 39.0–52.0)
Hemoglobin: 12.9 g/dL — ABNORMAL LOW (ref 13.0–17.0)
MCH: 27.1 pg (ref 26.0–34.0)
MCHC: 30.2 g/dL (ref 30.0–36.0)
MCV: 89.7 fL (ref 80.0–100.0)
PLATELETS: 221 10*3/uL (ref 150–400)
RBC: 4.76 MIL/uL (ref 4.22–5.81)
RDW: 13.5 % (ref 11.5–15.5)
WBC: 6.3 10*3/uL (ref 4.0–10.5)
nRBC: 0 % (ref 0.0–0.2)

## 2018-11-25 LAB — BASIC METABOLIC PANEL
Anion gap: 8 (ref 5–15)
BUN: 22 mg/dL (ref 8–23)
CALCIUM: 9.5 mg/dL (ref 8.9–10.3)
CO2: 26 mmol/L (ref 22–32)
Chloride: 108 mmol/L (ref 98–111)
Creatinine, Ser: 1.07 mg/dL (ref 0.61–1.24)
GFR calc Af Amer: >60 mL/min (ref >60)
Glucose, Bld: 137 mg/dL — ABNORMAL HIGH (ref 70–99)
Potassium: 4.1 mmol/L (ref 3.5–5.1)
Sodium: 142 mmol/L (ref 135–145)

## 2018-11-25 LAB — D-DIMER, QUANTITATIVE: D-Dimer, Quant: 1.93 ug/mL-FEU — ABNORMAL HIGH (ref 0.00–0.50)

## 2018-11-25 MED ORDER — FENOFIBRATE 160 MG PO TABS
160.0000 mg | ORAL_TABLET | Freq: Every day | ORAL | Status: DC
  Administered 2018-11-26: 160 mg via ORAL
  Filled 2018-11-25: qty 1

## 2018-11-25 MED ORDER — ONDANSETRON HCL 4 MG/2ML IJ SOLN: 4.0000 mg | Freq: Four times a day (QID) | INTRAMUSCULAR | Status: DC | PRN

## 2018-11-25 MED ORDER — ACETAMINOPHEN 325 MG PO TABS
650.0000 mg | ORAL_TABLET | ORAL | Status: DC | PRN
  Administered 2018-11-25: 650 mg via ORAL
  Filled 2018-11-25: qty 2

## 2018-11-25 MED ORDER — LISINOPRIL 5 MG PO TABS: 5.0000 mg | ORAL_TABLET | Freq: Every evening | ORAL | Status: DC

## 2018-11-25 MED ORDER — MELATONIN 10 MG PO TABS: 1.0000 | ORAL_TABLET | Freq: Every day | ORAL | Status: DC

## 2018-11-25 MED ORDER — ADULT MULTIVITAMIN W/MINERALS CH
1.0000 | ORAL_TABLET | Freq: Every day | ORAL | Status: DC
  Administered 2018-11-26: 1 via ORAL
  Filled 2018-11-25: qty 1

## 2018-11-25 MED ORDER — ASPIRIN EC 81 MG PO TBEC: 81.0000 mg | DELAYED_RELEASE_TABLET | Freq: Every day | ORAL | Status: DC

## 2018-11-25 MED ORDER — SIMVASTATIN 20 MG PO TABS
10.0000 mg | ORAL_TABLET | Freq: Every day | ORAL | Status: DC
  Administered 2018-11-25: 10 mg via ORAL
  Filled 2018-11-25: qty 1

## 2018-11-25 MED ORDER — FIBER 625 MG PO TABS: 1.0000 | ORAL_TABLET | Freq: Two times a day (BID) | ORAL | Status: DC

## 2018-11-25 MED ORDER — POLYETHYLENE GLYCOL 3350 17 G PO PACK: 17.0000 g | PACK | Freq: Every day | ORAL | Status: DC

## 2018-11-25 MED ORDER — MELATONIN 3 MG PO TABS: 3.0000 mg | ORAL_TABLET | Freq: Every day | ORAL | Status: DC

## 2018-11-25 MED ORDER — METFORMIN HCL 500 MG PO TABS
500.0000 mg | ORAL_TABLET | Freq: Two times a day (BID) | ORAL | Status: DC
  Filled 2018-11-25: qty 1

## 2018-11-25 MED ORDER — CLONAZEPAM 0.25 MG PO TBDP
0.2500 mg | ORAL_TABLET | Freq: Every day | ORAL | Status: DC
  Administered 2018-11-25: 0.25 mg via ORAL
  Filled 2018-11-25: qty 1

## 2018-11-25 MED ORDER — SODIUM CHLORIDE 0.9 % IV BOLUS
500.0000 mL | Freq: Once | INTRAVENOUS | Status: AC
  Administered 2018-11-25: 09:00:00 via INTRAVENOUS

## 2018-11-25 MED ORDER — ASPIRIN 81 MG PO CHEW: 324.0000 mg | CHEWABLE_TABLET | Freq: Once | ORAL | Status: DC

## 2018-11-25 MED ORDER — HEPARIN BOLUS VIA INFUSION
4000.0000 [IU] | Freq: Once | INTRAVENOUS | Status: AC
  Administered 2018-11-25: 4000 [IU] via INTRAVENOUS
  Filled 2018-11-25: qty 4000

## 2018-11-25 MED ORDER — NITROGLYCERIN 0.4 MG SL SUBL
0.4000 mg | SUBLINGUAL_TABLET | SUBLINGUAL | Status: DC | PRN
  Administered 2018-11-25: 0.4 mg via SUBLINGUAL
  Filled 2018-11-25: qty 1

## 2018-11-25 MED ORDER — HEPARIN (PORCINE) 25000 UT/250ML-% IV SOLN
1500.0000 [IU]/h | INTRAVENOUS | Status: DC
  Administered 2018-11-25: 1200 [IU]/h via INTRAVENOUS
  Administered 2018-11-26: 1500 [IU]/h via INTRAVENOUS
  Filled 2018-11-25 (×2): qty 250

## 2018-11-25 MED ORDER — VITAMIN C 500 MG PO TABS
500.0000 mg | ORAL_TABLET | Freq: Every day | ORAL | Status: DC
  Administered 2018-11-26: 500 mg via ORAL
  Filled 2018-11-25: qty 1

## 2018-11-25 MED ORDER — ASPIRIN 81 MG PO CHEW
324.0000 mg | CHEWABLE_TABLET | Freq: Once | ORAL | Status: AC
  Administered 2018-11-25: 324 mg via ORAL
  Filled 2018-11-25: qty 4

## 2018-11-25 MED ORDER — IOPAMIDOL (ISOVUE-370) INJECTION 76%
100.0000 mL | Freq: Once | INTRAVENOUS | Status: AC | PRN
  Administered 2018-11-25: 100 mL via INTRAVENOUS

## 2018-11-25 MED ORDER — PANTOPRAZOLE SODIUM 40 MG PO TBEC: 40.0000 mg | DELAYED_RELEASE_TABLET | Freq: Every day | ORAL | Status: DC

## 2018-11-25 MED ORDER — POLYETHYLENE GLYCOL 3350 17 GM/SCOOP PO POWD: 17.0000 g | ORAL | Status: DC | PRN

## 2018-11-25 MED ORDER — CALCIUM-MAGNESIUM-VITAMIN D 185-50-100 MG-MG-UNIT PO CAPS: ORAL_CAPSULE | Freq: Two times a day (BID) | ORAL | Status: DC

## 2018-11-25 NOTE — Progress Notes (Signed)
  ANTICOAGULATION CONSULT NOTE - Initial Consult  Pharmacy Consult for heparin Indication: chest pain/ACS  Allergies  Allergen Reactions  . Albamycin [Novobiocin] Other (See Comments)    Made me look like a strawberry   . Penicillins Rash and Other (See Comments)    Has patient had a PCN reaction causing immediate rash, facial/tongue/throat swelling, SOB or lightheadedness with hypotension:  no Has patient had a PCN reaction causing severe rash involving mucus membranes or skin necrosis: no Has patient had a PCN reaction that required hospitalization no Has patient had a PCN reaction occurring within the last 10 years: no - childhood reaction If all of the above answers are "NO", then may proceed with Cephalosporin use.     Patient Measurements: Height: 5\' 11"  (180.3 cm) Weight: 225 lb (102.1 kg) IBW/kg (Calculated) : 75.3 Heparin Dosing Weight: 96kg  Vital Signs: Temp: 97.8 F (36.6 C) (12/15 1544) Temp Source: Oral (12/15 1544) BP: 175/75 (12/15 1544) Pulse Rate: 74 (12/15 1544)  Labs: Recent Labs    11/25/18 0813 11/25/18 0818 11/25/18 1148  HGB  --  12.9*  --   HCT  --  42.7  --   PLT  --  221  --   CREATININE  --  1.07  --   TROPONINI <0.03  --  <0.03    Estimated Creatinine Clearance: 67 mL/min (by C-G formula based on SCr of 1.07 mg/dL).   Medical History: Past Medical History:  Diagnosis Date  . Arthritis    both knees  . Back pain at L4-L5 level   . Basal cell carcinoma   . BPH (benign prostatic hyperplasia)    hx s/p turp  . Chronic back pain    spondylosis  . Chronic kidney disease    kidney stones 1979  . DDD (degenerative disc disease), lumbar   . Diabetes mellitus    takes Metformin and Victoza daily-under control  . Diarrhea    x 1 today  . Diverticulitis   . Gallstones   . GERD (gastroesophageal reflux disease)    takes Omeprazole daily-under control  . H/O hiatal hernia   . H/O measles   . Hepatitis B    B-with Mono and jaundice  1960  . History of chicken pox   . History of colon polyps   . History of kidney stones   . Hyperlipidemia    takes Simvastatin and Tricor daily-under control  . Hypertension    takes Lisinopril daily-under control  . Joint pain    both knees  . Joint swelling    rt knee  . Kidney stones   . Melanoma ([place])    right hand;basal cell carcinoma  . Neuropathy   . Peripheral vascular disease ([facility])    diabetic neuropathy  . Pneumonia    hx of  . Prostate infection    currently taking macrobid  . Sleep apnea with use of continuous positive airway pressure (CPAP)   . Tinnitus   . Urinary incontinence   . UTI (lower urinary tract infection)      Assessment: 7 yoM admitted with CP to start on IV heparin for ACS r/o. CBC wnl, no OAC noted PTA.  Goal of Therapy:  Heparin level 0.3-0.7 units/ml Monitor platelets by anticoagulation protocol: Yes   Plan:  -Heparin 4000 units x1 -Heparin 1200 units/hr -Check 8hr heparin level  Arrie Senate, PharmD, BCPS Clinical Pharmacist Please check AMION for all [facility] numbers 11/25/2018

## 2018-11-25 NOTE — Consult Note (Signed)
Cardiology Consultation   Patient ID: Matthew Nash; [id number]; Mar 07, 1936   Admit date: 11/25/2018 Date of Consult: 11/25/2018  Referring Provider:  Dr Kyung Bacca  Primary Care Provider: Lorelei Pont Gay Filler, MD  Reason for Consultation: CP, abn EKG  History of Present Illness: Matthew Nash is a 82 y.o. male who is being seen today for the evaluation of CP/abn EKG at the request of Dr. Kyung Bacca. Pt was transferred from [place] this AM for CP; he presented there this morning. Pain started initially today around 6:30am. Pt describes a discomfort that began under his L arm and spread over to the L side of his chest. The pain is very pleuritic; his primary complaint is that the pain is sharp when he takes a deep breath. It is better when he sits upright. It has been pretty much constant since onset this AM; initial EKGs were fine and enzymes have been negative. He has no associated nausea, diaphoresis, SOB, palpitations or really any other associated sx. He has no prior h/o CAD or any other heart problems. He has no recent c/o rest or exertional CP, SOB/DOE, or other limiting cardiac sx.  A CT chest was done to r/o PE prior to transfer to [facility]; a "minimal" pericardial effusion was noted on that study. EKG this evening was read by computer as STEMI, so cardiology consult was called. EKG tonight shows ST elevation that is becoming more diffuse, present in I,  inferior leads as well as now V4-V6. Leads I and II as well as Anterolateral leads also show PR depression.  Past Medical History:  Diagnosis Date  . Arthritis    both knees  . Back pain at L4-L5 level   . Basal cell carcinoma   . BPH (benign prostatic hyperplasia)    hx s/p turp  . Chronic back pain    spondylosis  . Chronic kidney disease    kidney stones 1979  . DDD (degenerative disc disease), lumbar   . Diabetes mellitus    takes Metformin and Victoza daily-under control  . Diarrhea    x 1 today  . Diverticulitis   .  Gallstones   . GERD (gastroesophageal reflux disease)    takes Omeprazole daily-under control  . H/O hiatal hernia   . H/O measles   . Hepatitis B    B-with Mono and jaundice 1960  . History of chicken pox   . History of colon polyps   . History of kidney stones   . Hyperlipidemia    takes Simvastatin and Tricor daily-under control  . Hypertension    takes Lisinopril daily-under control  . Joint pain    both knees  . Joint swelling    rt knee  . Kidney stones   . Melanoma ([place])    right hand;basal cell carcinoma  . Neuropathy   . Peripheral vascular disease ([place])    diabetic neuropathy  . Pneumonia    hx of  . Prostate infection    currently taking macrobid  . Sleep apnea with use of continuous positive airway pressure (CPAP)   . Tinnitus   . Urinary incontinence   . UTI (lower urinary tract infection)     Past Surgical History:  Procedure Laterality Date  . BASAL CELL CARCINOMA EXCISION    . BOTOX INJECTION  03/22/2017   In the bladder for nightly incontinence.  . C5 and T1 bone chip removed   1986  . [place]  . CHOLECYSTECTOMY  [place]  . cortisone injection    . EYE SURGERY     cataract sx 2017 bilateral  . I&D left knee Left 1958   states 22 times  . KNEE ARTHROSCOPY Right 07/16/2014   Procedure: RIGHT ARTHROSCOPY KNEE WITH Medial and Lateral DEBRIDEMENT and chondroplasty;  Surgeon: Gearlean Alf, MD;  Location: [facility];  Service: Orthopedics;  Laterality: Right;  . LUMBAR LAMINECTOMY/DECOMPRESSION MICRODISCECTOMY Right 02/17/2014   Procedure: RIGHT LUMBAR TWO-THREE LAMINECTOMY;  Surgeon: Charlie Pitter, MD;  Location: [facility];  Service: Neurosurgery;  Laterality: Right;  right   . right shoulder surgery  2012   rotator cuff repair  . SHOULDER ARTHROSCOPY W/ ROTATOR CUFF REPAIR Left NOV 2010  . TONSILLECTOMY  1942  . TOTAL KNEE ARTHROPLASTY Right 05/27/2015   Procedure: RIGHT TOTAL KNEE ARTHROPLASTY;  Surgeon: Gaynelle Arabian, MD;  Location: [facility];  Service: Orthopedics;  Laterality: Right;  . TOTAL KNEE ARTHROPLASTY Left 05/30/2016   Procedure: LEFT TOTAL KNEE ARTHROPLASTY;  Surgeon: Gaynelle Arabian, MD;  Location: [facility];  Service: Orthopedics;  Laterality: Left;  . TRANSURETHRAL RESECTION OF PROSTATE  2006  . VASECTOMY  1983  . WISDOM TOOTH EXTRACTION        Current Medications: . aspirin EC  81 mg Oral Daily  . clonazePAM  0.25 mg Oral QHS  . [START ON 11/26/2018] fenofibrate  160 mg Oral Daily  . [START ON 11/26/2018] lisinopril  5 mg Oral QPM  . [START ON 11/26/2018] Melatonin  3 mg Oral QHS  . metFORMIN  500 mg Oral BID WC  . [START ON 11/26/2018] multivitamin with minerals  1 tablet Oral Daily  . [START ON 11/26/2018] pantoprazole  40 mg Oral Daily  . [START ON 11/26/2018] polyethylene glycol  17 g Oral Daily  . simvastatin  10 mg Oral QHS  . [START ON 11/26/2018] vitamin C  500 mg Oral Daily    Infused Medications: . heparin 1,200 Units/hr (11/25/18 1640)    PRN Medications: acetaminophen, nitroGLYCERIN, ondansetron (ZOFRAN) IV   Allergies:    Allergies  Allergen Reactions  . Albamycin [Novobiocin] Other (See Comments)    Made me look like a strawberry   . Penicillins Rash and Other (See Comments)    Has patient had a PCN reaction causing immediate rash, facial/tongue/throat swelling, SOB or lightheadedness with hypotension:  no Has patient had a PCN reaction causing severe rash involving mucus membranes or skin necrosis: no Has patient had a PCN reaction that required hospitalization no Has patient had a PCN reaction occurring within the last 10 years: no - childhood reaction If all of the above answers are "NO", then may proceed with Cephalosporin use.     Social History:   The patient  reports that he quit smoking about 40 years ago. His smoking use included pipe. He has never used smokeless tobacco. He reports current alcohol use. He reports that he does not use drugs.     Family History:   The patient's family history includes Asthma in his sister; Bladder Cancer in his mother; Breast cancer in his mother; Healthy in his brother, daughter, and son; Heart disease (age of onset: 42) in his mother; Obesity in his son; Prostate cancer in his father; Skin cancer in his brother.   ROS:  Please see the history of present illness.  All other ROS reviewed and negative.     Vital Signs: Blood pressure (!) 161/63, pulse 90, temperature 98.1 F (  36.7 C), temperature source Oral, resp. rate (!) 31, height 5\' 11"  (1.803 m), weight 102.1 kg, SpO2 97 %.   PHYSICAL EXAM: General:  Well nourished, well developed, in no acute distress HEENT: normal Lymph: no adenopathy Neck: no JVD Endocrine:  No thryomegaly Vascular: No carotid bruits; FA pulses 2+ bilaterally without bruits  Cardiac:  normal S1, S2; RRR; no murmur  Lungs:  clear to auscultation bilaterally, no wheezing, rhonchi or rales  Abd: soft, nontender, no hepatomegaly  Ext: no edema Musculoskeletal:  No deformities, BUE and BLE strength normal and equal Skin: warm and dry  Neuro:  CNs 2-12 intact, no focal abnormalities noted Psych:  Normal affect   EKG:  NSR with occasional PVC, ST elevation in I, II, aVF, V4-V6 with ST depression in aVR, PR depression in I, II, aVF, V4-V6  Labs: Recent Labs    11/25/18 0813 11/25/18 1148 11/25/18 1635  TROPONINI <0.03 <0.03 <0.03   No results for input(s): TROPIPOC in the last 72 hours.  Lab Results  Component Value Date   WBC 6.3 11/25/2018   HGB 12.9 (L) 11/25/2018   HCT 42.7 11/25/2018   MCV 89.7 11/25/2018   PLT 221 11/25/2018   Recent Labs  Lab 11/25/18 0818  NA 142  K 4.1  CL 108  CO2 26  BUN 22  CREATININE 1.07  CALCIUM 9.5  GLUCOSE 137*   Lab Results  Component Value Date   CHOL 117 10/31/2018   HDL 44.60 10/31/2018   LDLCALC 45 10/31/2018   TRIG 138.0 10/31/2018   Lab Results  Component Value Date   DDIMER 1.93 (H) 11/25/2018     Radiology/Studies:  Dg Chest 2 View  Result Date: 11/25/2018 CLINICAL DATA:  Left upper chest pain. EXAM: CHEST - 2 VIEW COMPARISON:  November 20, 2017 FINDINGS: The heart, hila, mediastinum, and pleura are normal. No pneumothorax. Mild opacity in the left base favored represent atelectasis. No focal infiltrate. IMPRESSION: No active cardiopulmonary disease. Electronically Signed   By: Dorise Bullion III M.D   On: 11/25/2018 08:36   Ct Angio Chest Pe W/cm &/or Wo Cm  Result Date: 11/25/2018 CLINICAL DATA:  LEFT upper chest pain, LEFT arm and neck pain, symptoms since this morning, associated shortness of breath, elevated D-dimer, question pulmonary embolism; history hypertension, diabetes mellitus, melanoma, GERD, former smoker EXAM: CT ANGIOGRAPHY CHEST WITH CONTRAST TECHNIQUE: Multidetector CT imaging of the chest was performed using the standard protocol during bolus administration of intravenous contrast. Multiplanar CT image reconstructions and MIPs were obtained to evaluate the vascular anatomy. CONTRAST:  180mL ISOVUE-370 IOPAMIDOL (ISOVUE-370) INJECTION 76% IV COMPARISON:  None FINDINGS: Cardiovascular: Minimal pericardial effusion. Mild aneurysmal dilatation of the ascending thoracic aorta 4.1 cm transverse. No evidence of aortic dissection. Pulmonary arteries well opacified and patent. No evidence of pulmonary embolism. Mediastinum/Nodes: No esophageal abnormalities. Base of cervical region normal appearance. No thoracic adenopathy. Lungs/Pleura: Scattered subsegmental atelectasis dependently in BILATERAL lower lobes and dependently in the LEFT upper lobe along the major fissure. No infiltrate, pleural effusion, pneumothorax or mass. Upper Abdomen: Indeterminate 12 x 10 mm LEFT adrenal nodule. 12 x 11 mm RIGHT adrenal adenoma. Remaining visualized upper abdomen unremarkable. Bones demineralized. Musculoskeletal: 12 x 10 mm indeterminate LEFT adrenal nodule. 12 x 11 mm diameter RIGHT adrenal  adenoma. Review of the MIP images confirms the above findings. IMPRESSION: No evidence of pulmonary embolism. Minimal scattered atelectasis in both lungs without infiltrate. 12 x 11 mm RIGHT adrenal adenoma. Additional 12 x 10 mm  indeterminate LEFT adrenal nodule. Aneurysmal dilatation ascending thoracic aorta 4.1 cm diameter recommendation below. Recommend annual imaging followup by CTA or MRA. This recommendation follows 2010 ACCF/AHA/AATS/ACR/ASA/SCA/SCAI/SIR/STS/SVM Guidelines for the Diagnosis and Management of Patients with Thoracic Aortic Disease. Circulation. 2010; 121: [id number] Aortic Atherosclerosis (ICD10-I70.0). Aortic aneurysm NOS (ICD10-I71.9). Electronically Signed   By: Lavonia Dana M.D.   On: 11/25/2018 10:07    ASSESSMENT AND PLAN:  1. CP/abn EKG: enzymes have been negative thus far with pain ongoing/constant since 6:30am. Clinical hx is suggestive of pericarditis, and EKG looks consistent with this diagnosis also. Discussed the case and EKG's with Dr. Burt Knack, interventional cardiology on call. Will check stat repeat troponin. If negative, will treat empirically for pericarditis.   2. HTN/dyslipidemia/PAD: cont home regimen; mgmt as per hospital medicine primary team  Thank you for the opportunity to participate in the care of this patient. Will follow. Please call w/ questions.   Signed, Rudean Curt, MD, [facility]  11/25/2018 8:41 PM

## 2018-11-25 NOTE — H&P (Signed)
History and Physical  [name]:[id number] DOB: 1936-04-12 DOA: 11/25/2018  Referring physician: Dr. Roxine Caddy PCP: Darreld Mclean, MD  Outpatient Specialists:  Patient coming from: Home & is able to ambulate   Chief Complaint: Pain  HPI: Matthew Nash is a 82 y.o. male with medical history significant for type 2 diabetes mellitus, chronic kidney disease, degenerative lumbar disease, GERD, diabetes mellitus, non-insulin-dependent, hypertension sleep apnea who presents to the emergency department with chest pain it started about 630 this morning.  Patient describes it as a pressure feeling in his chest there was no radiation initially but now on arrival to [place] he stated that the pain is now in his left shoulder.  He had some shortness of breath but denies nausea vomiting or diaphoresis patient does not have any known history of heart disease in the past he has a history of peripheral vascular disease however  ED Course: Patient was transferred from [facility] med center with chest pain d-dimer was positive at 1.98 a CT of the chest was done and it was negative for pulmonary embolism  Review of Systems: . Pt complains of chest pain tenderness in the chest wall pain worse with deep breathing  Pt denies any nausea vomiting.  Review of systems are otherwise negative   Past Medical History:  Diagnosis Date  . Arthritis    both knees  . Back pain at L4-L5 level   . Basal cell carcinoma   . BPH (benign prostatic hyperplasia)    hx s/p turp  . Chronic back pain    spondylosis  . Chronic kidney disease    kidney stones 1979  . DDD (degenerative disc disease), lumbar   . Diabetes mellitus    takes Metformin and Victoza daily-under control  . Diarrhea    x 1 today  . Diverticulitis   . Gallstones   . GERD (gastroesophageal reflux disease)    takes Omeprazole daily-under control  . H/O hiatal hernia   . H/O measles   . Hepatitis B    B-with Mono and jaundice 1960   . History of chicken pox   . History of colon polyps   . History of kidney stones   . Hyperlipidemia    takes Simvastatin and Tricor daily-under control  . Hypertension    takes Lisinopril daily-under control  . Joint pain    both knees  . Joint swelling    rt knee  . Kidney stones   . Melanoma ([place])    right hand;basal cell carcinoma  . Neuropathy   . Peripheral vascular disease ([place])    diabetic neuropathy  . Pneumonia    hx of  . Prostate infection    currently taking macrobid  . Sleep apnea with use of continuous positive airway pressure (CPAP)   . Tinnitus   . Urinary incontinence   . UTI (lower urinary tract infection)    Past Surgical History:  Procedure Laterality Date  . BASAL CELL CARCINOMA EXCISION    . BOTOX INJECTION  03/22/2017   In the bladder for nightly incontinence.  . C5 and T1 bone chip removed   1986  . [place]  . CHOLECYSTECTOMY  1993  . COLONOSCOPY  2011  . cortisone injection    . EYE SURGERY     cataract sx 2017 bilateral  . I&D left knee Left 1958   states 22 times  . KNEE ARTHROSCOPY Right 07/16/2014   Procedure: RIGHT ARTHROSCOPY  KNEE WITH Medial and Lateral DEBRIDEMENT and chondroplasty;  Surgeon: Gearlean Alf, MD;  Location: [facility];  Service: Orthopedics;  Laterality: Right;  . LUMBAR LAMINECTOMY/DECOMPRESSION MICRODISCECTOMY Right 02/17/2014   Procedure: RIGHT LUMBAR TWO-THREE LAMINECTOMY;  Surgeon: Charlie Pitter, MD;  Location: [facility];  Service: Neurosurgery;  Laterality: Right;  right   . right shoulder surgery  2012   rotator cuff repair  . SHOULDER ARTHROSCOPY W/ ROTATOR CUFF REPAIR Left NOV 2010  . TONSILLECTOMY  1942  . TOTAL KNEE ARTHROPLASTY Right 05/27/2015   Procedure: RIGHT TOTAL KNEE ARTHROPLASTY;  Surgeon: Gaynelle Arabian, MD;  Location: [facility];  Service: Orthopedics;  Laterality: Right;  . TOTAL KNEE ARTHROPLASTY Left 05/30/2016   Procedure: LEFT TOTAL KNEE ARTHROPLASTY;  Surgeon: Gaynelle Arabian, MD;   Location: [facility];  Service: Orthopedics;  Laterality: Left;  . TRANSURETHRAL RESECTION OF PROSTATE  2006  . VASECTOMY  1983  . WISDOM TOOTH EXTRACTION      Social History:  reports that he quit smoking about 40 years ago. His smoking use included pipe. He has never used smokeless tobacco. He reports current alcohol use. He reports that he does not use drugs.   Allergies  Allergen Reactions  . Albamycin [Novobiocin] Other (See Comments)    Made me look like a strawberry   . Penicillins Rash and Other (See Comments)    Has patient had a PCN reaction causing immediate rash, facial/tongue/throat swelling, SOB or lightheadedness with hypotension:  no Has patient had a PCN reaction causing severe rash involving mucus membranes or skin necrosis: no Has patient had a PCN reaction that required hospitalization no Has patient had a PCN reaction occurring within the last 10 years: no - childhood reaction If all of the above answers are "NO", then may proceed with Cephalosporin use.     Family History  Problem Relation Age of Onset  . Heart disease Mother 67       Deceased  . Bladder Cancer Mother   . Breast cancer Mother   . Prostate cancer Father        Deceased-in 16s  . Healthy Brother   . Skin cancer Brother        "basal cell cancer in the bloodstream"  . Asthma Sister        Deceased  . Healthy Son        #1  . Healthy Daughter        #2  . Obesity Son        #2      Prior to Admission medications   Medication Sig Start Date End Date Taking? Authorizing Provider  aspirin EC 81 MG tablet Take 81 mg by mouth daily.    [provider]  CALCIUM-MAGNESIUM-VITAMIN D PO Take 2 tablets by mouth 2 (two) times daily.     [provider]  cephALEXin (KEFLEX) 500 MG capsule Take 500 mg by mouth daily.    [provider]  clonazePAM (KLONOPIN) 0.5 MG tablet Take 1/2 or 1 qhs prn sleep 09/24/18   Copland, Gay Filler, MD  FIBER PO Take 2 capsules by mouth 2  (two) times daily.     [provider]  glucose blood (FREESTYLE LITE) test strip USE AS INSTRUCTED TO TEST BLOOD GLUCOSE TWICE A DAY 03/14/18   Copland, Gay Filler, MD  Lancets (FREESTYLE) lancets USE 1 LANCET TWICE A DAY AS INSTRUCTED 03/14/18   Copland, Gay Filler, MD  lisinopril (PRINIVIL,ZESTRIL) 5 MG tablet TAKE  1 TABLET EVERY EVENING 01/19/18   Copland, Gay Filler, MD  Melatonin 10 MG TABS Take 1 tablet by mouth at bedtime.    [provider]  metFORMIN (GLUCOPHAGE) 500 MG tablet TAKE 1 TABLET TWICE A DAY WITH MEALS 09/07/18   Copland, Gay Filler, MD  Multiple Vitamin (MULTI-VITAMINS) TABS Take 1 g by mouth daily.    [provider]  omeprazole (PRILOSEC) 20 MG capsule TAKE 1 CAPSULE DAILY Patient taking differently: TAKE 1 CAPSULE BY MOUTH TWICE DAILY 06/15/17   Copland, Gay Filler, MD  Polyethylene Glycol 3350 (MIRALAX PO) Take by mouth as needed.     [provider]  Probiotic Product (PROBIOTIC ADVANCED PO) Take by mouth.    [provider]  simvastatin (ZOCOR) 10 MG tablet Take 1 tablet (10 mg total) by mouth at bedtime. 10/15/18   Copland, Gay Filler, MD  TRICOR 145 MG tablet TAKE 1 TABLET EVERY EVENING 11/01/18   Copland, Gay Filler, MD  vitamin C (ASCORBIC ACID) 500 MG tablet Take 500 mg by mouth daily.    [provider]    Physical Exam: BP (!) 175/75 (BP Location: Left Arm)   Pulse 74   Temp 97.8 F (36.6 C) (Oral)   Resp 20   Ht 5\' 11"  (1.803 m)   Wt 102.1 kg   SpO2 94%   BMI 31.38 kg/m   Exam:  . General: 82 y.o. year-old male well developed well nourished in no acute distress.  Alert and oriented x3. . Cardiovascular: Regular rate and rhythm with no rubs or gallops.  No thyromegaly or JVD noted.  Reproducible chest wall tenderness,pleuritic chest pain . Respiratory: Clear to auscultation with no wheezes or rales. Good inspiratory effort. . Abdomen: Soft nontender nondistended with normal bowel sounds x4  quadrants. . Musculoskeletal: No lower extremity edema. 2/4 pulses in all 4 extremities. . Skin: No ulcerative lesions noted or rashes, . Psychiatry: Mood is appropriate for condition and setting           Labs on Admission:  Basic Metabolic Panel: Recent Labs  Lab 11/25/18 0818  NA 142  K 4.1  CL 108  CO2 26  GLUCOSE 137*  BUN 22  CREATININE 1.07  CALCIUM 9.5   Liver Function Tests: No results for input(s): AST, ALT, ALKPHOS, BILITOT, PROT, ALBUMIN in the last 168 hours. No results for input(s): LIPASE, AMYLASE in the last 168 hours. No results for input(s): AMMONIA in the last 168 hours. CBC: Recent Labs  Lab 11/25/18 0818  WBC 6.3  HGB 12.9*  HCT 42.7  MCV 89.7  PLT 221   Cardiac Enzymes: Recent Labs  Lab 11/25/18 0813 11/25/18 1148  TROPONINI <0.03 <0.03    BNP (last 3 results) No results for input(s): BNP in the last 8760 hours.  ProBNP (last 3 results) No results for input(s): PROBNP in the last 8760 hours.  CBG: No results for input(s): GLUCAP in the last 168 hours.  Radiological Exams on Admission: Dg Chest 2 View  Result Date: 11/25/2018 CLINICAL DATA:  Left upper chest pain. EXAM: CHEST - 2 VIEW COMPARISON:  November 20, 2017 FINDINGS: The heart, hila, mediastinum, and pleura are normal. No pneumothorax. Mild opacity in the left base favored represent atelectasis. No focal infiltrate. IMPRESSION: No active cardiopulmonary disease. Electronically Signed   By: Dorise Bullion III M.D   On: 11/25/2018 08:36   Ct Angio Chest Pe W/cm &/or Wo Cm  Result Date: 11/25/2018 CLINICAL DATA:  LEFT upper chest  pain, LEFT arm and neck pain, symptoms since this morning, associated shortness of breath, elevated D-dimer, question pulmonary embolism; history hypertension, diabetes mellitus, melanoma, GERD, former smoker EXAM: CT ANGIOGRAPHY CHEST WITH CONTRAST TECHNIQUE: Multidetector CT imaging of the chest was performed using the standard protocol during bolus  administration of intravenous contrast. Multiplanar CT image reconstructions and MIPs were obtained to evaluate the vascular anatomy. CONTRAST:  162mL ISOVUE-370 IOPAMIDOL (ISOVUE-370) INJECTION 76% IV COMPARISON:  None FINDINGS: Cardiovascular: Minimal pericardial effusion. Mild aneurysmal dilatation of the ascending thoracic aorta 4.1 cm transverse. No evidence of aortic dissection. Pulmonary arteries well opacified and patent. No evidence of pulmonary embolism. Mediastinum/Nodes: No esophageal abnormalities. Base of cervical region normal appearance. No thoracic adenopathy. Lungs/Pleura: Scattered subsegmental atelectasis dependently in BILATERAL lower lobes and dependently in the LEFT upper lobe along the major fissure. No infiltrate, pleural effusion, pneumothorax or mass. Upper Abdomen: Indeterminate 12 x 10 mm LEFT adrenal nodule. 12 x 11 mm RIGHT adrenal adenoma. Remaining visualized upper abdomen unremarkable. Bones demineralized. Musculoskeletal: 12 x 10 mm indeterminate LEFT adrenal nodule. 12 x 11 mm diameter RIGHT adrenal adenoma. Review of the MIP images confirms the above findings. IMPRESSION: No evidence of pulmonary embolism. Minimal scattered atelectasis in both lungs without infiltrate. 12 x 11 mm RIGHT adrenal adenoma. Additional 12 x 10 mm indeterminate LEFT adrenal nodule. Aneurysmal dilatation ascending thoracic aorta 4.1 cm diameter recommendation below. Recommend annual imaging followup by CTA or MRA. This recommendation follows 2010 ACCF/AHA/AATS/ACR/ASA/SCA/SCAI/SIR/STS/SVM Guidelines for the Diagnosis and Management of Patients with Thoracic Aortic Disease. Circulation. 2010; 121: P536-R443 Aortic Atherosclerosis (ICD10-I70.0). Aortic aneurysm NOS (ICD10-I71.9). Electronically Signed   By: Lavonia Dana M.D.   On: 11/25/2018 10:07    EKG: Independently reviewed.  Some possible ST elevation but there is artifact we will going to repeat the EKG  Assessment/Plan Present on Admission: .  Gastroesophageal reflux disease without esophagitis . Lumbosacral spondylosis without myelopathy . Ischemic chest pain ([place]) . Chest pain  Principal Problem:   Ischemic chest pain ([facility]) Active Problems:   Lumbosacral spondylosis without myelopathy   Diabetes mellitus type II, controlled ([place])   Gastroesophageal reflux disease without esophagitis   OSA on CPAP   Chest pain   1.Chest pain rule out MI, heart score is 5 his EKG seems to be abnormal with questionable STEMI troponin are negative x3 he has atypical chest pain with pleuritic component.  Patient was given nitroglycerin which he said did not help his pain while he was at [facility] center in addition it caused his blood pressure to go low so he has not been given any more nitroglycerin.  I have consulted with Dr. Jacquelynn Cree the cardiologist on-call who will be evaluating patient  2.  History of GERD  3.  Pleuritic chest pain or other anti-inflammatory  4.  Positive d-dimer with negative CTA for PE.  5.  Incidental finding of thoracic aortic aneurysm which is 4 cm patient to have this evaluated as outpatient  6.Pulmonary nodule patient would have this evaluated in the outpatient setting  Severity of Illness: The appropriate patient status for this patient is OBSERVATION. Observation status is judged to be reasonable and necessary in order to provide the required intensity of service to ensure the patient's safety. The patient's presenting symptoms, physical exam findings, and initial radiographic and laboratory data in the context of their medical condition is felt to place them at decreased risk for further clinical deterioration. Furthermore, it is anticipated that the patient will be medically  stable for discharge from the hospital within 2 midnights of admission. The following factors support the patient status of observation.   " The patient's presenting symptoms include chest pain. " The physical exam findings include  reproducible chest wall tenderness and pleurisy ,abnormal EKG. " The initial radiographic and laboratory data are negative troponin, EKG abnormal but questionable STEMI.     DVT prophylaxis: Heparin  Code Status: Full  Family Communication: None at bedside  Disposition Plan: Home  Consults called: cardiology, Dr. Jacquelynn Cree  Admission status: Observation    Cristal Deer MD [facility] Pager [phone]  If 7PM-7AM, please contact night-coverage [email or web address] Password [facility]  11/25/2018, 4:17 PM

## 2018-11-25 NOTE — ED Triage Notes (Signed)
L side chest pressure since 630 this morning with associated L neck and L arm pain.

## 2018-11-25 NOTE — ED Notes (Signed)
Attempted IV to Left arm x 2, tol well, unsuccessful

## 2018-11-25 NOTE — Progress Notes (Signed)
Pt received via care link from [facility] med center. Pt having substernal chest pain non radiating. Pt states it worsens upon inspiration. Vitals stable. CCMD notified telebox 12 applied. CHG bath given. Will continue to monitor pt. Jerald Kief, RN

## 2018-11-25 NOTE — ED Provider Notes (Signed)
[facility] EMERGENCY DEPARTMENT Provider Note   CSN: [id number] Arrival date & time: 11/25/18  7564     History   Chief Complaint Chief Complaint  Patient presents with  . Chest Pain    HPI Matthew Nash is a 82 y.o. male.  Patient is a 82 year old male with a history of diabetes, hypertension, sleep apnea who presents with chest pain.  It started about 630 this morning as a pressure feeling in his left chest.  There is no radiation although he has some discomfort under his left arm but it does not go down his arm.  There is no so she abdominal pain.  He does have some shortness of breath with it but no nausea or vomiting.  No diaphoresis.  It is not related to movement.  Its worse with deep breathing.  He denies any cough or chest congestion.  No fevers.  No leg swelling.  No history of known heart disease in the past.  He does have a history of peripheral vascular disease.     Past Medical History:  Diagnosis Date  . Arthritis    both knees  . Back pain at L4-L5 level   . Basal cell carcinoma   . BPH (benign prostatic hyperplasia)    hx s/p turp  . Chronic back pain    spondylosis  . Chronic kidney disease    kidney stones 1979  . DDD (degenerative disc disease), lumbar   . Diabetes mellitus    takes Metformin and Victoza daily-under control  . Diarrhea    x 1 today  . Diverticulitis   . Gallstones   . GERD (gastroesophageal reflux disease)    takes Omeprazole daily-under control  . H/O hiatal hernia   . H/O measles   . Hepatitis B    B-with Mono and jaundice 1960  . History of chicken pox   . History of colon polyps   . History of kidney stones   . Hyperlipidemia    takes Simvastatin and Tricor daily-under control  . Hypertension    takes Lisinopril daily-under control  . Joint pain    both knees  . Joint swelling    rt knee  . Kidney stones   . Melanoma ([place])    right hand;basal cell carcinoma  . Neuropathy   . Peripheral vascular  disease ([place])    diabetic neuropathy  . Pneumonia    hx of  . Prostate infection    currently taking macrobid  . Sleep apnea with use of continuous positive airway pressure (CPAP)   . Tinnitus   . Urinary incontinence   . UTI (lower urinary tract infection)     Patient Active Problem List   Diagnosis Date Noted  . Chronic kidney disease 11/25/2018  . Ischemic chest pain ([place]) 11/25/2018  . Chest pain 11/25/2018  . Pelvic floor dysfunction 04/04/2018  . Change in bowel habits 04/04/2018  . OSA on CPAP 01/12/2018  . REM behavioral disorder 01/12/2018  . Diarrhea 09/15/2017  . Incontinence of feces 09/15/2017  . Peripheral neuropathy 10/20/2016  . Bladder outlet obstruction 05/02/2016  . OA (osteoarthritis) of knee 05/27/2015  . Diabetes mellitus type II, controlled ([place]) 11/26/2014  . Gastroesophageal reflux disease without esophagitis 11/26/2014  . Arthritis of both knees 11/26/2014  . Absence of bladder continence 11/26/2014  . Hyperlipidemia LDL goal <100 11/26/2014  . Acute medial meniscal tear 07/16/2014  . Lumbosacral spondylosis without myelopathy 02/17/2014  . Spondylosis, lumbosacral 02/17/2014  Past Surgical History:  Procedure Laterality Date  . BASAL CELL CARCINOMA EXCISION    . BOTOX INJECTION  03/22/2017   In the bladder for nightly incontinence.  . C5 and T1 bone chip removed   1986  . [place]  . CHOLECYSTECTOMY  1993  . COLONOSCOPY  2011  . cortisone injection    . EYE SURGERY     cataract sx 2017 bilateral  . I&D left knee Left 1958   states 22 times  . KNEE ARTHROSCOPY Right 07/16/2014   Procedure: RIGHT ARTHROSCOPY KNEE WITH Medial and Lateral DEBRIDEMENT and chondroplasty;  Surgeon: Gearlean Alf, MD;  Location: [facility];  Service: Orthopedics;  Laterality: Right;  . LUMBAR LAMINECTOMY/DECOMPRESSION MICRODISCECTOMY Right 02/17/2014   Procedure: RIGHT LUMBAR TWO-THREE LAMINECTOMY;  Surgeon: Charlie Pitter, MD;  Location: [facility];  Service: Neurosurgery;  Laterality: Right;  right   . right shoulder surgery  2012   rotator cuff repair  . SHOULDER ARTHROSCOPY W/ ROTATOR CUFF REPAIR Left NOV 2010  . TONSILLECTOMY  1942  . TOTAL KNEE ARTHROPLASTY Right 05/27/2015   Procedure: RIGHT TOTAL KNEE ARTHROPLASTY;  Surgeon: Gaynelle Arabian, MD;  Location: [facility];  Service: Orthopedics;  Laterality: Right;  . TOTAL KNEE ARTHROPLASTY Left 05/30/2016   Procedure: LEFT TOTAL KNEE ARTHROPLASTY;  Surgeon: Gaynelle Arabian, MD;  Location: [facility];  Service: Orthopedics;  Laterality: Left;  . TRANSURETHRAL RESECTION OF PROSTATE  2006  . VASECTOMY  1983  . WISDOM TOOTH EXTRACTION          Home Medications    Prior to Admission medications   Medication Sig Start Date End Date Taking? Authorizing Provider  aspirin EC 81 MG tablet Take 81 mg by mouth daily.    [provider]  CALCIUM-MAGNESIUM-VITAMIN D PO Take 2 tablets by mouth 2 (two) times daily.     [provider]  cephALEXin (KEFLEX) 500 MG capsule Take 500 mg by mouth daily.    [provider]  clonazePAM (KLONOPIN) 0.5 MG tablet Take 1/2 or 1 qhs prn sleep 09/24/18   Copland, Gay Filler, MD  FIBER PO Take 2 capsules by mouth 2 (two) times daily.     [provider]  glucose blood (FREESTYLE LITE) test strip USE AS INSTRUCTED TO TEST BLOOD GLUCOSE TWICE A DAY 03/14/18   Copland, Gay Filler, MD  Lancets (FREESTYLE) lancets USE 1 LANCET TWICE A DAY AS INSTRUCTED 03/14/18   Copland, Gay Filler, MD  lisinopril (PRINIVIL,ZESTRIL) 5 MG tablet TAKE 1 TABLET EVERY EVENING 01/19/18   Copland, Gay Filler, MD  Melatonin 10 MG TABS Take 1 tablet by mouth at bedtime.    [provider]  metFORMIN (GLUCOPHAGE) 500 MG tablet TAKE 1 TABLET TWICE A DAY WITH MEALS 09/07/18   Copland, Gay Filler, MD  Multiple Vitamin (MULTI-VITAMINS) TABS Take 1 g by mouth daily.    [provider]  omeprazole (PRILOSEC) 20 MG capsule TAKE 1 CAPSULE DAILY Patient taking  differently: TAKE 1 CAPSULE BY MOUTH TWICE DAILY 06/15/17   Copland, Gay Filler, MD  Polyethylene Glycol 3350 (MIRALAX PO) Take by mouth as needed.     [provider]  Probiotic Product (PROBIOTIC ADVANCED PO) Take by mouth.    [provider]  simvastatin (ZOCOR) 10 MG tablet Take 1 tablet (10 mg total) by mouth at bedtime. 10/15/18   Copland, Gay Filler, MD  TRICOR 145 MG tablet TAKE 1 TABLET EVERY EVENING 11/01/18   Copland,  Gay Filler, MD  vitamin C (ASCORBIC ACID) 500 MG tablet Take 500 mg by mouth daily.    [provider]    Family History Family History  Problem Relation Age of Onset  . Heart disease Mother 53       Deceased  . Bladder Cancer Mother   . Breast cancer Mother   . Prostate cancer Father        Deceased-in 15s  . Healthy Brother   . Skin cancer Brother        "basal cell cancer in the bloodstream"  . Asthma Sister        Deceased  . Healthy Son        #1  . Healthy Daughter        #2  . Obesity Son        #2    Social History Social History   Tobacco Use  . Smoking status: Former Smoker    Types: Pipe    Last attempt to quit: 07/14/1978    Years since quitting: 40.3  . Smokeless tobacco: Never Used  . Tobacco comment: quit smoking 1979  Substance Use Topics  . Alcohol use: Yes    Comment: OCCASIONAL  . Drug use: No     Allergies   Albamycin [novobiocin] and Penicillins   Review of Systems Review of Systems  Constitutional: Negative for chills, diaphoresis, fatigue and fever.  HENT: Negative for congestion, rhinorrhea and sneezing.   Eyes: Negative.   Respiratory: Positive for chest tightness and shortness of breath. Negative for cough.   Cardiovascular: Negative for chest pain and leg swelling.  Gastrointestinal: Negative for abdominal pain, blood in stool, diarrhea, nausea and vomiting.  Genitourinary: Negative for difficulty urinating, flank pain, frequency and hematuria.  Musculoskeletal: Negative for arthralgias  and back pain.  Skin: Negative for rash.  Neurological: Negative for dizziness, speech difficulty, weakness, numbness and headaches.     Physical Exam Updated Vital Signs BP (!) 162/79   Pulse 64   Temp 97.9 F (36.6 C)   Resp 13   Ht 5\' 11"  (1.803 m)   Wt 102.1 kg   SpO2 96%   BMI 31.38 kg/m   Physical Exam Constitutional:      Appearance: He is well-developed.  HENT:     Head: Normocephalic and atraumatic.  Eyes:     Pupils: Pupils are equal, round, and reactive to light.  Neck:     Musculoskeletal: Normal range of motion and neck supple.  Cardiovascular:     Rate and Rhythm: Normal rate and regular rhythm.     Heart sounds: Normal heart sounds.  Pulmonary:     Effort: Pulmonary effort is normal. No respiratory distress.     Breath sounds: Normal breath sounds. No wheezing or rales.  Chest:     Chest wall: Tenderness (Positive tenderness on palpation of the left chest wall, no crepitus or deformity) present.  Abdominal:     General: Bowel sounds are normal.     Palpations: Abdomen is soft.     Tenderness: There is no abdominal tenderness. There is no guarding or rebound.  Musculoskeletal: Normal range of motion.  Lymphadenopathy:     Cervical: No cervical adenopathy.  Skin:    General: Skin is warm and dry.     Findings: No rash.  Neurological:     Mental Status: He is alert and oriented to person, place, and time.      ED Treatments / Results  Labs (  all labs ordered are listed, but only abnormal results are displayed) Labs Reviewed  BASIC METABOLIC PANEL - Abnormal; Notable for the following components:      Result Value   Glucose, Bld 137 (*)    All other components within normal limits  CBC - Abnormal; Notable for the following components:   Hemoglobin 12.9 (*)    All other components within normal limits  D-DIMER, QUANTITATIVE (NOT AT [facility]) - Abnormal; Notable for the following components:   D-Dimer, Quant 1.93 (*)    All other components within  normal limits  TROPONIN I  TROPONIN I    EKG EKG Interpretation  Date/Time:  Sunday November 25 2018 08:11:28 EST Ventricular Rate:  77 PR Interval:    QRS Duration: 93 QT Interval:  410 QTC Calculation: 464 R Axis:   37 Text Interpretation:  Sinus rhythm Short PR interval Abnormal R-wave progression, early transition since last tracing no significant change Confirmed by Malvin Johns [phone]) on 11/25/2018 9:41:31 AM   Radiology Dg Chest 2 View  Result Date: 11/25/2018 CLINICAL DATA:  Left upper chest pain. EXAM: CHEST - 2 VIEW COMPARISON:  November 20, 2017 FINDINGS: The heart, hila, mediastinum, and pleura are normal. No pneumothorax. Mild opacity in the left base favored represent atelectasis. No focal infiltrate. IMPRESSION: No active cardiopulmonary disease. Electronically Signed   By: Dorise Bullion III M.D   On: 11/25/2018 08:36   Ct Angio Chest Pe W/cm &/or Wo Cm  Result Date: 11/25/2018 CLINICAL DATA:  LEFT upper chest pain, LEFT arm and neck pain, symptoms since this morning, associated shortness of breath, elevated D-dimer, question pulmonary embolism; history hypertension, diabetes mellitus, melanoma, GERD, former smoker EXAM: CT ANGIOGRAPHY CHEST WITH CONTRAST TECHNIQUE: Multidetector CT imaging of the chest was performed using the standard protocol during bolus administration of intravenous contrast. Multiplanar CT image reconstructions and MIPs were obtained to evaluate the vascular anatomy. CONTRAST:  177mL ISOVUE-370 IOPAMIDOL (ISOVUE-370) INJECTION 76% IV COMPARISON:  None FINDINGS: Cardiovascular: Minimal pericardial effusion. Mild aneurysmal dilatation of the ascending thoracic aorta 4.1 cm transverse. No evidence of aortic dissection. Pulmonary arteries well opacified and patent. No evidence of pulmonary embolism. Mediastinum/Nodes: No esophageal abnormalities. Base of cervical region normal appearance. No thoracic adenopathy. Lungs/Pleura: Scattered subsegmental  atelectasis dependently in BILATERAL lower lobes and dependently in the LEFT upper lobe along the major fissure. No infiltrate, pleural effusion, pneumothorax or mass. Upper Abdomen: Indeterminate 12 x 10 mm LEFT adrenal nodule. 12 x 11 mm RIGHT adrenal adenoma. Remaining visualized upper abdomen unremarkable. Bones demineralized. Musculoskeletal: 12 x 10 mm indeterminate LEFT adrenal nodule. 12 x 11 mm diameter RIGHT adrenal adenoma. Review of the MIP images confirms the above findings. IMPRESSION: No evidence of pulmonary embolism. Minimal scattered atelectasis in both lungs without infiltrate. 12 x 11 mm RIGHT adrenal adenoma. Additional 12 x 10 mm indeterminate LEFT adrenal nodule. Aneurysmal dilatation ascending thoracic aorta 4.1 cm diameter recommendation below. Recommend annual imaging followup by CTA or MRA. This recommendation follows 2010 ACCF/AHA/AATS/ACR/ASA/SCA/SCAI/SIR/STS/SVM Guidelines for the Diagnosis and Management of Patients with Thoracic Aortic Disease. Circulation. 2010; 121: D220-U542 Aortic Atherosclerosis (ICD10-I70.0). Aortic aneurysm NOS (ICD10-I71.9). Electronically Signed   By: Lavonia Dana M.D.   On: 11/25/2018 10:07    Procedures Procedures (including critical care time)  Medications Ordered in ED Medications  nitroGLYCERIN (NITROSTAT) SL tablet 0.4 mg (0.4 mg Sublingual Given 11/25/18 0847)  sodium chloride 0.9 % bolus 500 mL (0 mLs Intravenous Stopped 11/25/18 1010)  iopamidol (ISOVUE-370) 76 % injection  100 mL (100 mLs Intravenous Contrast Given 11/25/18 0932)  aspirin chewable tablet 324 mg (324 mg Oral Given 11/25/18 1058)     Initial Impression / Assessment and Plan / ED Course  I have reviewed the triage vital signs and the nursing notes.  Pertinent labs & imaging results that were available during my care of the patient were reviewed by me and considered in my medical decision making (see chart for details).     Patient is a 82 year old male who presents  with chest pain.  It is a little bit atypical in that it is worse with deep breathing.  It started this morning.  He has associated shortness of breath.  No hypoxia.  His troponin is negative.  There is no ischemic changes on EKG.  His d-dimer was elevated and CT was performed which shows no evidence of pulmonary embolus.  There is a thoracic aneurysm which is 4.1 cm.  There is also some pulmonary nodules which will need outpatient follow-up.  I discussed these findings with the patient.  There is no evidence of pneumonia or other etiology for the pain.  He has risk factors including diabetes which is currently diet-controlled, hypertension and hyperlipidemia.  Given this elevated heart score, I feel he needs inpatient evaluation for his chest pain.  He got 1 nitroglycerin in the ED which dropped his blood pressure and did not help his pain so he got no further administration of nitroglycerin.  He was given aspirin.  He denied wanting any further pain medication.  I spoke with Dr. Kyung Bacca who has accepted the pt for transfer to [facility].  Final Clinical Impressions(s) / ED Diagnoses   Final diagnoses:  Atypical chest pain  Pulmonary nodule  Thoracic aortic aneurysm without rupture [facility])    ED Discharge Orders    None       Malvin Johns, MD 11/25/18 1342

## 2018-11-26 ENCOUNTER — Ambulatory Visit (HOSPITAL_BASED_OUTPATIENT_CLINIC_OR_DEPARTMENT_OTHER): Payer: Medicare Other

## 2018-11-26 DIAGNOSIS — I361 Nonrheumatic tricuspid (valve) insufficiency: Secondary | ICD-10-CM

## 2018-11-26 DIAGNOSIS — I309 Acute pericarditis, unspecified: Secondary | ICD-10-CM | POA: Diagnosis not present

## 2018-11-26 DIAGNOSIS — I209 Angina pectoris, unspecified: Principal | ICD-10-CM

## 2018-11-26 LAB — HEPARIN LEVEL (UNFRACTIONATED)
HEPARIN UNFRACTIONATED: 0.11 [IU]/mL — AB (ref 0.30–0.70)
Heparin Unfractionated: 0.41 IU/mL (ref 0.30–0.70)

## 2018-11-26 LAB — BASIC METABOLIC PANEL
Anion gap: 9 (ref 5–15)
BUN: 16 mg/dL (ref 8–23)
CO2: 25 mmol/L (ref 22–32)
Calcium: 9.3 mg/dL (ref 8.9–10.3)
Chloride: 105 mmol/L (ref 98–111)
Creatinine, Ser: 1.13 mg/dL (ref 0.61–1.24)
GFR calc Af Amer: >60 mL/min (ref >60)
GFR calc non Af Amer: >60 mL/min (ref >60)
Glucose, Bld: 138 mg/dL — ABNORMAL HIGH (ref 70–99)
Potassium: 3.8 mmol/L (ref 3.5–5.1)
SODIUM: 139 mmol/L (ref 135–145)

## 2018-11-26 LAB — CBC
HCT: 40.3 % (ref 39.0–52.0)
HCT: 41.2 % (ref 39.0–52.0)
Hemoglobin: 12.2 g/dL — ABNORMAL LOW (ref 13.0–17.0)
Hemoglobin: 12.6 g/dL — ABNORMAL LOW (ref 13.0–17.0)
MCH: 26.8 pg (ref 26.0–34.0)
MCH: 26.5 pg (ref 26.0–34.0)
MCHC: 30.3 g/dL (ref 30.0–36.0)
MCHC: 30.6 g/dL (ref 30.0–36.0)
MCV: 87.4 fL (ref 80.0–100.0)
MCV: 87.7 fL (ref 80.0–100.0)
NRBC: 0 % (ref 0.0–0.2)
Platelets: 229 10*3/uL (ref 150–400)
Platelets: 192 10*3/uL (ref 150–400)
RBC: 4.61 MIL/uL (ref 4.22–5.81)
RBC: 4.7 MIL/uL (ref 4.22–5.81)
RDW: 13.7 % (ref 11.5–15.5)
RDW: 13.9 % (ref 11.5–15.5)
WBC: 9.8 10*3/uL (ref 4.0–10.5)
WBC: 8.4 10*3/uL (ref 4.0–10.5)
nRBC: 0 % (ref 0.0–0.2)

## 2018-11-26 LAB — GLUCOSE, CAPILLARY
Glucose-Capillary: 126 mg/dL — ABNORMAL HIGH (ref 70–99)
Glucose-Capillary: 181 mg/dL — ABNORMAL HIGH (ref 70–99)

## 2018-11-26 LAB — C-REACTIVE PROTEIN: CRP: 14.2 mg/dL — ABNORMAL HIGH (ref <1.0)

## 2018-11-26 LAB — MAGNESIUM: Magnesium: 1.7 mg/dL (ref 1.7–2.4)

## 2018-11-26 LAB — ECHOCARDIOGRAM COMPLETE
Height: 71 in
Weight: 3600 oz

## 2018-11-26 LAB — SEDIMENTATION RATE: Sed Rate: 17 mm/hr — ABNORMAL HIGH (ref 0–16)

## 2018-11-26 LAB — TROPONIN I
TROPONIN I: 0.03 ng/mL — AB (ref <0.03)
Troponin I: <0.03 ng/mL (ref <0.03)

## 2018-11-26 MED ORDER — COLCHICINE 0.6 MG PO TABS
0.6000 mg | ORAL_TABLET | Freq: Every day | ORAL | Status: DC
  Administered 2018-11-26: 0.6 mg via ORAL
  Filled 2018-11-26: qty 1

## 2018-11-26 MED ORDER — COLCHICINE 0.6 MG PO TABS: 0.6000 mg | ORAL_TABLET | Freq: Every day | ORAL | 2 refills | Status: DC

## 2018-11-26 MED ORDER — ASPIRIN 325 MG PO TABS
975.0000 mg | ORAL_TABLET | Freq: Three times a day (TID) | ORAL | Status: DC
  Administered 2018-11-26: 975 mg via ORAL
  Filled 2018-11-26: qty 3

## 2018-11-26 MED ORDER — ASPIRIN 325 MG PO TABS: 975.0000 mg | ORAL_TABLET | Freq: Three times a day (TID) | ORAL | 0 refills | Status: DC

## 2018-11-26 MED ORDER — PANTOPRAZOLE SODIUM 40 MG PO TBEC
40.0000 mg | DELAYED_RELEASE_TABLET | Freq: Every day | ORAL | Status: DC
  Administered 2018-11-26: 40 mg via ORAL
  Filled 2018-11-26: qty 1

## 2018-11-26 MED ORDER — HEPARIN BOLUS VIA INFUSION
3000.0000 [IU] | Freq: Once | INTRAVENOUS | Status: AC
  Administered 2018-11-26: 3000 [IU] via INTRAVENOUS
  Filled 2018-11-26: qty 3000

## 2018-11-26 MED ORDER — SACCHAROMYCES BOULARDII 250 MG PO CAPS
250.0000 mg | ORAL_CAPSULE | Freq: Every day | ORAL | Status: DC
  Administered 2018-11-26: 250 mg via ORAL
  Filled 2018-11-26: qty 1

## 2018-11-26 MED ORDER — CEPHALEXIN 500 MG PO CAPS
500.0000 mg | ORAL_CAPSULE | ORAL | Status: DC
  Administered 2018-11-26: 500 mg via ORAL
  Filled 2018-11-26: qty 1

## 2018-11-26 NOTE — Progress Notes (Signed)
Pt educated and provided discharge instructions. Pt telebox removed. Iv removed and intact. Pt has all belongings. Wife at bedside. Pt has no complaints. Volunteers to tx pt via wheelchair to [place] parking to meet ride. Jerald Kief

## 2018-11-26 NOTE — Progress Notes (Signed)
CRITICAL VALUE ALERT  Critical Value:  Troponin 0.03  Date & Time Notied:  11/26/2018 by lab @ 0016  Provider Notified: Rudean Curt, MD  Orders Received/Actions taken: No new orders at this time. Continue to cycle troponin levels.

## 2018-11-26 NOTE — Discharge Summary (Signed)
Physician Discharge Summary  Matthew Nash SLP:[id number] DOB: 01/24/1936 DOA: 11/25/2018  PCP: Darreld Mclean, MD  Admit date: 11/25/2018 Discharge date: 11/26/2018  Admitted From: Home Disposition: Home  Recommendations for Outpatient Follow-up:  1. Follow up with PCP in 1-2 weeks 2. Please obtain BMP/CBC in one week your next doctors visit.  3. Aspirin 975 mg 3 times daily for 2 weeks.  Colchicine orally daily for 3 weeks 4. Patient is on PPI I requested him to continue this and take it 30 minutes before his morning meal. 5. BMP in 1 week which needs to be followed up by his PCP. 6. Outpatient follow-up appointment will be made by the cardiology team.  Discharge Condition: Stable CODE STATUS: Full code Diet recommendation: Diabetic  Brief/Interim Summary: 82 year old with history of GERD, diabetes mellitus type 2, BPH, obstructive sleep apnea came to the hospital with complaints of chest pain.  EKG was noted to be abnormal concerning for ST elevation diffusely but troponins were negative.  There is suspicion for likely pericarditis.  Patient's pain was pleuritic in nature.  Cardiology was consulted.  Echocardiogram did not show any acute changes.  Patient was started on aspirin to be taken 3 times daily for next [redacted] weeks along with PPI and colchicine to be taken for 3 months daily.  Outpatient cardiology follow-up arrangements to be made by their service.  In the meantime patient can also follow-up outpatient with primary care provider in 1-2 weeks.  Patient needs repeat lab work in about 1 week. CTA of the chest was negative for PE.  ESR and CRP were elevated. Patient has reached maximal effect from hospital stay and stable for discharge   Discharge Diagnoses:  Principal Problem:   Ischemic chest pain ([place]) Active Problems:   Lumbosacral spondylosis without myelopathy   Diabetes mellitus type II, controlled ([place])   Gastroesophageal reflux disease without esophagitis   OSA on  CPAP   Chest pain  Chest pain secondary to acute pericarditis -Patient started on aspirin for 2 weeks 3 times daily along with colchicine to be taken for 3 months.  PPI has been prescribed to be taken daily as well especially while on aspirin. -Echocardiogram does not show an acute changes. -Patient seen by cardiology  Elevated d-dimer -CTA of the chest is negative for pulmonary embolism  Incidental finding of thoracic aortic aneurysm-4 cm - Follow-up outpatient primary care provider  GERD -PPI  Diabetes mellitus type 2 -Resume home medications.  On metformin 500 mg twice daily  Essential hypertension -Continue lisinopril 5 mg daily.  Blood pressure is slightly elevated, should follow-up outpatient with primary care provider for further adjustment.  Hyperlipidemia -Continue statin  Discharge Instructions   Allergies as of 11/26/2018      Reactions   Albamycin [novobiocin] Other (See Comments)   Made me look like a strawberry    Penicillins Rash, Other (See Comments)   Has patient had a PCN reaction causing immediate rash, facial/tongue/throat swelling, SOB or lightheadedness with hypotension:  no Has patient had a PCN reaction causing severe rash involving mucus membranes or skin necrosis: no Has patient had a PCN reaction that required hospitalization no Has patient had a PCN reaction occurring within the last 10 years: no - childhood reaction If all of the above answers are "NO", then may proceed with Cephalosporin use.      Medication List    STOP taking these medications   cephALEXin 500 MG capsule Commonly known as:  KEFLEX  TAKE these medications   aspirin 325 MG tablet Take 3 tablets (975 mg total) by mouth 3 (three) times daily for 14 days.   CALCIUM-MAGNESIUM-VITAMIN D PO Take 2 tablets by mouth 2 (two) times daily.   clonazePAM 0.5 MG tablet Commonly known as:  KLONOPIN Take 1/2 or 1 qhs prn sleep What changed:    how much to take  how to  take this  when to take this  additional instructions   colchicine 0.6 MG tablet Take 1 tablet (0.6 mg total) by mouth daily. Start taking on:  November 27, 2018   FIBER PO Take 3 capsules by mouth 2 (two) times daily.   freestyle lancets USE 1 LANCET TWICE A DAY AS INSTRUCTED   glucose blood test strip Commonly known as:  FREESTYLE LITE USE AS INSTRUCTED TO TEST BLOOD GLUCOSE TWICE A DAY   lisinopril 5 MG tablet Commonly known as:  PRINIVIL,ZESTRIL TAKE 1 TABLET EVERY EVENING   Melatonin 10 MG Tabs Take 1 tablet by mouth at bedtime.   metFORMIN 500 MG tablet Commonly known as:  GLUCOPHAGE TAKE 1 TABLET TWICE A DAY WITH MEALS   MULTI-VITAMINS Tabs Take 1 tablet by mouth daily.   omeprazole 20 MG capsule Commonly known as:  PRILOSEC TAKE 1 CAPSULE DAILY What changed:  when to take this   PROBIOTIC ADVANCED PO Take 1 tablet by mouth daily.   simvastatin 10 MG tablet Commonly known as:  ZOCOR Take 1 tablet (10 mg total) by mouth at bedtime.   TRICOR 145 MG tablet Generic drug:  fenofibrate TAKE 1 TABLET EVERY EVENING What changed:  how much to take   vitamin C 500 MG tablet Commonly known as:  ASCORBIC ACID Take 500 mg by mouth daily.      Follow-up Information    Copland, Gay Filler, MD. Schedule an appointment as soon as possible for a visit in 1 week(s).   Specialty:  Family Medicine Contact information: [address] [place] [address] [phone]          Allergies  Allergen Reactions  . Albamycin [Novobiocin] Other (See Comments)    Made me look like a strawberry   . Penicillins Rash and Other (See Comments)    Has patient had a PCN reaction causing immediate rash, facial/tongue/throat swelling, SOB or lightheadedness with hypotension:  no Has patient had a PCN reaction causing severe rash involving mucus membranes or skin necrosis: no Has patient had a PCN reaction that required hospitalization no Has patient had a  PCN reaction occurring within the last 10 years: no - childhood reaction If all of the above answers are "NO", then may proceed with Cephalosporin use.     You were cared for by a hospitalist during your hospital stay. If you have any questions about your discharge medications or the care you received while you were in the hospital after you are discharged, you can call the unit and asked to speak with the hospitalist on call if the hospitalist that took care of you is not available. Once you are discharged, your primary care physician will handle any further medical issues. Please note that no refills for any discharge medications will be authorized once you are discharged, as it is imperative that you return to your primary care physician (or establish a relationship with a primary care physician if you do not have one) for your aftercare needs so that they can reassess your need for medications and monitor your  lab values.  Consultations:  Cardiology   Procedures/Studies: Dg Chest 2 View  Result Date: 11/25/2018 CLINICAL DATA:  Left upper chest pain. EXAM: CHEST - 2 VIEW COMPARISON:  November 20, 2017 FINDINGS: The heart, hila, mediastinum, and pleura are normal. No pneumothorax. Mild opacity in the left base favored represent atelectasis. No focal infiltrate. IMPRESSION: No active cardiopulmonary disease. Electronically Signed   By: Dorise Bullion III M.D   On: 11/25/2018 08:36   Ct Angio Chest Pe W/cm &/or Wo Cm  Result Date: 11/25/2018 CLINICAL DATA:  LEFT upper chest pain, LEFT arm and neck pain, symptoms since this morning, associated shortness of breath, elevated D-dimer, question pulmonary embolism; history hypertension, diabetes mellitus, melanoma, GERD, former smoker EXAM: CT ANGIOGRAPHY CHEST WITH CONTRAST TECHNIQUE: Multidetector CT imaging of the chest was performed using the standard protocol during bolus administration of intravenous contrast. Multiplanar CT image  reconstructions and MIPs were obtained to evaluate the vascular anatomy. CONTRAST:  153m ISOVUE-370 IOPAMIDOL (ISOVUE-370) INJECTION 76% IV COMPARISON:  None FINDINGS: Cardiovascular: Minimal pericardial effusion. Mild aneurysmal dilatation of the ascending thoracic aorta 4.1 cm transverse. No evidence of aortic dissection. Pulmonary arteries well opacified and patent. No evidence of pulmonary embolism. Mediastinum/Nodes: No esophageal abnormalities. Base of cervical region normal appearance. No thoracic adenopathy. Lungs/Pleura: Scattered subsegmental atelectasis dependently in BILATERAL lower lobes and dependently in the LEFT upper lobe along the major fissure. No infiltrate, pleural effusion, pneumothorax or mass. Upper Abdomen: Indeterminate 12 x 10 mm LEFT adrenal nodule. 12 x 11 mm RIGHT adrenal adenoma. Remaining visualized upper abdomen unremarkable. Bones demineralized. Musculoskeletal: 12 x 10 mm indeterminate LEFT adrenal nodule. 12 x 11 mm diameter RIGHT adrenal adenoma. Review of the MIP images confirms the above findings. IMPRESSION: No evidence of pulmonary embolism. Minimal scattered atelectasis in both lungs without infiltrate. 12 x 11 mm RIGHT adrenal adenoma. Additional 12 x 10 mm indeterminate LEFT adrenal nodule. Aneurysmal dilatation ascending thoracic aorta 4.1 cm diameter recommendation below. Recommend annual imaging followup by CTA or MRA. This recommendation follows 2010 ACCF/AHA/AATS/ACR/ASA/SCA/SCAI/SIR/STS/SVM Guidelines for the Diagnosis and Management of Patients with Thoracic Aortic Disease. Circulation. 2010; 121: eE720-N470Aortic Atherosclerosis (ICD10-I70.0). Aortic aneurysm NOS (ICD10-I71.9). Electronically Signed   By: MLavonia DanaM.D.   On: 11/25/2018 10:07      Subjective: Patient wishes to go home.  States his pleuritic chest pain is better.  General = no fevers, chills, dizziness, malaise, fatigue HEENT/EYES = negative for pain, redness, loss of vision, double  vision, blurred vision, loss of hearing, sore throat, hoarseness, dysphagia Cardiovascular= negative for chest pain, palpitation, murmurs, lower extremity swelling Respiratory/lungs= negative for shortness of breath, cough, hemoptysis, wheezing, mucus production Gastrointestinal= negative for nausea, vomiting,, abdominal pain, melena, hematemesis Genitourinary= negative for Dysuria, Hematuria, Change in Urinary Frequency MSK = Negative for arthralgia, myalgias, Back Pain, Joint swelling  Neurology= Negative for headache, seizures, numbness, tingling  Psychiatry= Negative for anxiety, depression, suicidal and homocidal ideation Allergy/Immunology= Medication/Food allergy as listed  Skin= Negative for Rash, lesions, ulcers, itching    Discharge Exam: Vitals:   11/25/18 2011 11/26/18 0645  BP: (!) 161/63 129/63  Pulse: 90   Resp: (!) 31   Temp: 98.1 F (36.7 C) 97.8 F (36.6 C)  SpO2: 97%    Vitals:   11/25/18 1430 11/25/18 1544 11/25/18 2011 11/26/18 0645  BP: (!) 167/85 (!) 175/75 (!) 161/63 129/63  Pulse: 73 74 90   Resp: (!) 22 20 (!) 31   Temp:  97.8 F (36.6 C) 98.1  F (36.7 C) 97.8 F (36.6 C)  TempSrc:  Oral Oral Oral  SpO2: 97% 94% 97%   Weight:      Height:        General: Pt is alert, awake, not in acute distress Cardiovascular: RRR, S1/S2 +, no rubs, no gallops Respiratory: CTA bilaterally, no wheezing, no rhonchi Abdominal: Soft, NT, ND, bowel sounds + Extremities: no edema, no cyanosis    The results of significant diagnostics from this hospitalization (including imaging, microbiology, ancillary and laboratory) are listed below for reference.     Microbiology: No results found for this or any previous visit (from the past 240 hour(s)).   Labs: BNP (last 3 results) No results for input(s): BNP in the last 8760 hours. Basic Metabolic Panel: Recent Labs  Lab 11/25/18 0818 11/26/18 0830  NA 142 139  K 4.1 3.8  CL 108 105  CO2 26 25  GLUCOSE 137*  138*  BUN 22 16  CREATININE 1.07 1.13  CALCIUM 9.5 9.3  MG  --  1.7   Liver Function Tests: No results for input(s): AST, ALT, ALKPHOS, BILITOT, PROT, ALBUMIN in the last 168 hours. No results for input(s): LIPASE, AMYLASE in the last 168 hours. No results for input(s): AMMONIA in the last 168 hours. CBC: Recent Labs  Lab 11/25/18 0818 11/26/18 0048 11/26/18 0830  WBC 6.3 9.8 8.4  HGB 12.9* 12.2* 12.6*  HCT 42.7 40.3 41.2  MCV 89.7 87.4 87.7  PLT 221 229 192   Cardiac Enzymes: Recent Labs  Lab 11/25/18 0813 11/25/18 1148 11/25/18 1635 11/25/18 2231 11/26/18 1135  TROPONINI <0.03 <0.03 <0.03 0.03* <0.03   BNP: Invalid input(s): POCBNP CBG: Recent Labs  Lab 11/25/18 1604 11/25/18 2121  GLUCAP 126* 181*   D-Dimer Recent Labs    11/25/18 0817  DDIMER 1.93*   Hgb A1c No results for input(s): HGBA1C in the last 72 hours. Lipid Profile No results for input(s): CHOL, HDL, LDLCALC, TRIG, CHOLHDL, LDLDIRECT in the last 72 hours. Thyroid function studies No results for input(s): TSH, T4TOTAL, T3FREE, THYROIDAB in the last 72 hours.  Invalid input(s): FREET3 Anemia work up No results for input(s): VITAMINB12, FOLATE, FERRITIN, TIBC, IRON, RETICCTPCT in the last 72 hours. Urinalysis    Component Value Date/Time   COLORURINE YELLOW 04/28/2018 [place] 04/28/2018 1447   LABSPEC 1.015 04/28/2018 1447   PHURINE 6.0 04/28/2018 1447   GLUCOSEU NEGATIVE 04/28/2018 1447   GLUCOSEU NEGATIVE 03/20/2015 1012   HGBUR NEGATIVE 04/28/2018 [place] 04/28/2018 1447   BILIRUBINUR negative 07/27/2016 1614   KETONESUR NEGATIVE 04/28/2018 1447   PROTEINUR NEGATIVE 04/28/2018 1447   UROBILINOGEN negative 07/27/2016 1614   UROBILINOGEN 1.0 08/23/2015 1230   NITRITE NEGATIVE 04/28/2018 1447   LEUKOCYTESUR TRACE (A) 04/28/2018 1447   Sepsis Labs Invalid input(s): PROCALCITONIN,  WBC,  LACTICIDVEN Microbiology No results found for this or  any previous visit (from the past 240 hour(s)).   Time coordinating discharge:  I have spent 35 minutes face to face with the patient and on the ward discussing the patients care, assessment, plan and disposition with other care givers. >50% of the time was devoted counseling the patient about the risks and benefits of treatment/Discharge disposition and coordinating care.   SIGNED:   Damita Lack, MD  [facility] 11/26/2018, 1:37 PM Pager   If 7PM-7AM, please contact night-coverage [email or web address] Password [email or web address]

## 2018-11-26 NOTE — Progress Notes (Signed)
ANTICOAGULATION CONSULT NOTE - Follow Up Consult  Pharmacy Consult for heparin Indication: chest pain/ACS  Labs: Recent Labs    11/25/18 0818 11/25/18 1148 11/25/18 1635 11/25/18 2231 11/26/18 0048  HGB 12.9*  --   --   --  12.2*  HCT 42.7  --   --   --  40.3  PLT 221  --   --   --  229  HEPARINUNFRC  --   --   --   --  0.11*  CREATININE 1.07  --   --   --   --   TROPONINI  --  <0.03 <0.03 0.03*  --     Assessment: 82yo male subtherapeutic on heparin with initial dosing for CP; no gtt issues or signs of bleeding per RN.  Goal of Therapy:  Heparin level 0.3-0.7 units/ml   Plan:  Will rebolus with heparin 3000 units and increase heparin gtt by 3 units/kg/hr to 1500 units/hr and check level in 8 hours.    Wynona Neat, PharmD, BCPS  11/26/2018,1:25 AM

## 2018-11-26 NOTE — Progress Notes (Signed)
Progress Note  Patient Name: Matthew Nash Date of Encounter: 11/26/2018  Primary Cardiologist: Buford Dresser, MD, PhD (new)  Subjective  Pain is down to a 1/10, worse with breathing. Very sharp. Denies any recent illnesses, "haven't had a cold or flu in 8-10 years." No prior cardiac history. Mother had CABG.   Inpatient Medications    Scheduled Meds: . aspirin  975 mg Oral TID  . cephALEXin  500 mg Oral BH-q7a  . clonazePAM  0.25 mg Oral QHS  . colchicine  0.6 mg Oral Daily  . fenofibrate  160 mg Oral Daily  . lisinopril  5 mg Oral QPM  . Melatonin  3 mg Oral QHS  . metFORMIN  500 mg Oral BID WC  . multivitamin with minerals  1 tablet Oral Daily  . pantoprazole  40 mg Oral QAC breakfast  . polyethylene glycol  17 g Oral Daily  . saccharomyces boulardii  250 mg Oral Daily  . simvastatin  10 mg Oral QHS  . vitamin C  500 mg Oral Daily   Continuous Infusions: . heparin 1,500 Units/hr (11/26/18 0702)   PRN Meds: acetaminophen, nitroGLYCERIN, ondansetron (ZOFRAN) IV   Vital Signs    Vitals:   11/25/18 1430 11/25/18 1544 11/25/18 2011 11/26/18 0645  BP: (!) 167/85 (!) 175/75 (!) 161/63 129/63  Pulse: 73 74 90   Resp: (!) 22 20 (!) 31   Temp:  97.8 F (36.6 C) 98.1 F (36.7 C) 97.8 F (36.6 C)  TempSrc:  Oral Oral Oral  SpO2: 97% 94% 97%   Weight:      Height:        Intake/Output Summary (Last 24 hours) at 11/26/2018 1127 Last data filed at 11/26/2018 0445 Gross per 24 hour  Intake 0 ml  Output 1300 ml  Net -1300 ml   Filed Weights   11/25/18 0809  Weight: 102.1 kg    Telemetry    Sinus rhythm - Personally Reviewed  ECG    Appears evolving across ECGs; concave ST elevations most prominent in II, aVF, V6 with PR depression as well- Personally Reviewed  Physical Exam   GEN: No acute distress.   Neck: No JVD Cardiac: RRR, no murmurs, rubs, or gallops.  Respiratory: Clear to auscultation bilaterally. GI: Soft, nontender, non-distended    MS: No edema; No deformity. Neuro:  Nonfocal  Psych: Normal affect   Labs    Chemistry Recent Labs  Lab 11/25/18 0818 11/26/18 0830  NA 142 139  K 4.1 3.8  CL 108 105  CO2 26 25  GLUCOSE 137* 138*  BUN 22 16  CREATININE 1.07 1.13  CALCIUM 9.5 9.3  GFRNONAA >60 >60  GFRAA >60 >60  ANIONGAP 8 9     Hematology Recent Labs  Lab 11/25/18 0818 11/26/18 0048 11/26/18 0830  WBC 6.3 9.8 8.4  RBC 4.76 4.61 4.70  HGB 12.9* 12.2* 12.6*  HCT 42.7 40.3 41.2  MCV 89.7 87.4 87.7  MCH 27.1 26.5 26.8  MCHC 30.2 30.3 30.6  RDW 13.5 13.7 13.9  PLT 221 229 192    Cardiac Enzymes Recent Labs  Lab 11/25/18 0813 11/25/18 1148 11/25/18 1635 11/25/18 2231  TROPONINI <0.03 <0.03 <0.03 0.03*   No results for input(s): TROPIPOC in the last 168 hours.   BNPNo results for input(s): BNP, PROBNP in the last 168 hours.   DDimer  Recent Labs  Lab 11/25/18 0817  DDIMER 1.93*     Radiology    Dg Chest 2  View  Result Date: 11/25/2018 CLINICAL DATA:  Left upper chest pain. EXAM: CHEST - 2 VIEW COMPARISON:  November 20, 2017 FINDINGS: The heart, hila, mediastinum, and pleura are normal. No pneumothorax. Mild opacity in the left base favored represent atelectasis. No focal infiltrate. IMPRESSION: No active cardiopulmonary disease. Electronically Signed   By: Dorise Bullion III M.D   On: 11/25/2018 08:36   Ct Angio Chest Pe W/cm &/or Wo Cm  Result Date: 11/25/2018 CLINICAL DATA:  LEFT upper chest pain, LEFT arm and neck pain, symptoms since this morning, associated shortness of breath, elevated D-dimer, question pulmonary embolism; history hypertension, diabetes mellitus, melanoma, GERD, former smoker EXAM: CT ANGIOGRAPHY CHEST WITH CONTRAST TECHNIQUE: Multidetector CT imaging of the chest was performed using the standard protocol during bolus administration of intravenous contrast. Multiplanar CT image reconstructions and MIPs were obtained to evaluate the vascular anatomy. CONTRAST:   161mL ISOVUE-370 IOPAMIDOL (ISOVUE-370) INJECTION 76% IV COMPARISON:  None FINDINGS: Cardiovascular: Minimal pericardial effusion. Mild aneurysmal dilatation of the ascending thoracic aorta 4.1 cm transverse. No evidence of aortic dissection. Pulmonary arteries well opacified and patent. No evidence of pulmonary embolism. Mediastinum/Nodes: No esophageal abnormalities. Base of cervical region normal appearance. No thoracic adenopathy. Lungs/Pleura: Scattered subsegmental atelectasis dependently in BILATERAL lower lobes and dependently in the LEFT upper lobe along the major fissure. No infiltrate, pleural effusion, pneumothorax or mass. Upper Abdomen: Indeterminate 12 x 10 mm LEFT adrenal nodule. 12 x 11 mm RIGHT adrenal adenoma. Remaining visualized upper abdomen unremarkable. Bones demineralized. Musculoskeletal: 12 x 10 mm indeterminate LEFT adrenal nodule. 12 x 11 mm diameter RIGHT adrenal adenoma. Review of the MIP images confirms the above findings. IMPRESSION: No evidence of pulmonary embolism. Minimal scattered atelectasis in both lungs without infiltrate. 12 x 11 mm RIGHT adrenal adenoma. Additional 12 x 10 mm indeterminate LEFT adrenal nodule. Aneurysmal dilatation ascending thoracic aorta 4.1 cm diameter recommendation below. Recommend annual imaging followup by CTA or MRA. This recommendation follows 2010 ACCF/AHA/AATS/ACR/ASA/SCA/SCAI/SIR/STS/SVM Guidelines for the Diagnosis and Management of Patients with Thoracic Aortic Disease. Circulation. 2010; 121: U440-H474 Aortic Atherosclerosis (ICD10-I70.0). Aortic aneurysm NOS (ICD10-I71.9). Electronically Signed   By: Lavonia Dana M.D.   On: 11/25/2018 10:07    Cardiac Studies   Echo 11/26/18 Left ventricle: The cavity size was normal. Systolic function was   normal. The estimated ejection fraction was in the range of 60%   to 65%. Wall motion was normal; there were no regional wall   motion abnormalities.  Patient Profile     82 y.o. male without  prior cardiac history presenting with one day of sharp, pleuritic chest pain.   Assessment & Plan    Chest pain: Given symptoms, elevated CRP, ECG findings, consistent with pericarditis. Echo unremarkable.  -already start on 975 mg aspirin TID -already started on colchicine. Of note, he does report a history of fecal incontinence intermittently. If diarrhea is problematic, may need to reassess colchicine, but would prefer to keep on if tolerated. -on pantoprazole for GI protection. Denies history of ulcers. -all troponins negative except for one overnight which was borderline. Repeated this AM, negative. No further ischemic workup indicated.  I will request a follow up appointment with cardiology.  [facility] will sign off, but please feel free to call with any questions.   Medication Recommendations:  Aspirin TID for two weeks, colchicine for 3 months. PPI while on aspirin Other recommendations (labs, testing, etc):  BMET with PCP in 1 week Follow up as an outpatient:  I have  requested a follow up visit at the [facility] office for about 3 weeks.  For questions or updates, please contact [place] Please consult [email or web address] for contact info under     Signed, Buford Dresser, MD  11/26/2018, 11:27 AM

## 2018-11-26 NOTE — Care Management Obs Status (Signed)
[facility] NOTIFICATION   Patient Details  Name: GHAZI RUMPF MRN: [id number] Date of Birth: 02/11/1936   Medicare Observation Status Notification Given:  Yes    Dawayne Patricia, RN 11/26/2018, 2:40 PM

## 2018-11-26 NOTE — Care Management CC44 (Signed)
Condition Code 44 Documentation Completed  Patient Details  Name: Matthew Nash MRN: [id number] Date of Birth: 10-26-1936   Condition Code 44 given:  Yes Patient signature on Condition Code 44 notice:  Yes Documentation of 2 MD's agreement:  Yes Code 44 added to claim:  Yes    Dawayne Patricia, RN 11/26/2018, 2:41 PM

## 2018-11-26 NOTE — Progress Notes (Signed)
ANTICOAGULATION CONSULT NOTE - Follow Up Consult  Pharmacy Consult for heparin Indication: chest pain/ACS  Labs: Recent Labs    11/25/18 0818 11/25/18 1148 11/25/18 1635 11/25/18 2231 11/26/18 0048 11/26/18 0830  HGB 12.9*  --   --   --  12.2* 12.6*  HCT 42.7  --   --   --  40.3 41.2  PLT 221  --   --   --  229 192  HEPARINUNFRC  --   --   --   --  0.11* 0.41  CREATININE 1.07  --   --   --   --  1.13  TROPONINI  --  <0.03 <0.03 0.03*  --   --     Assessment: 82yo male subtherapeutic on heparin with initial dosing for chest pain. Heparin level is 0.41, therapeutic after heparin drip rate increased to 1500 units/hour. No bleeding noted. Hgb low/stable and pltc remains within normal.   Goal of Therapy:  Heparin level 0.3-0.7 units/ml   Plan:  Continue heparin drip at 1500 units/hr  Check heparin level in 6-8 hours to confirm therapeutic  Daily heparin level and CBC  Nicole Cella, RPh Clinical Pharmacist Please check AMION for all [place] phone numbers After 10:00 PM, call [place] [phone] 11/26/2018,11:16 AM

## 2018-11-26 NOTE — Progress Notes (Signed)
  Echocardiogram 2D Echocardiogram has been performed.  Matthew Nash 11/26/2018, 10:26 AM

## 2018-11-27 NOTE — Progress Notes (Signed)
[place] [facility] [address] [place], [place] [address] [phone]  Date:  11/29/2018   Name:  Matthew Nash   DOB:  March 18, 1936   MRN:  [id number]  PCP:  Darreld Mclean, MD    Chief Complaint: Hospitalization Follow-up (chest pain, follow up)   History of Present Illness:  Matthew Nash is a 82 y.o. very pleasant male patient who presents with the following:  Following up from recent hospital admission He was admitted for CP and was dx with pericarditis earlier this week  Admit date: 11/25/2018 Discharge date: 11/26/2018  Recommendations for Outpatient Follow-up:  1. Follow up with PCP in 1-2 weeks 2. Please obtain BMP/CBC in one week your next doctors visit.  3. Aspirin 975 mg 3 times daily for 2 weeks.  Colchicine orally daily for 3 weeks 4. Patient is on PPI I requested him to continue this and take it 30 minutes before his morning meal. 5. BMP in 1 week which needs to be followed up by his PCP. 6. Outpatient follow-up appointment will be made by the cardiology team.  Brief/Interim Summary: 82 year old with history of GERD, diabetes mellitus type 2, BPH, obstructive sleep apnea came to the hospital with complaints of chest pain.  EKG was noted to be abnormal concerning for ST elevation diffusely but troponins were negative.  There is suspicion for likely pericarditis.  Patient's pain was pleuritic in nature.  Cardiology was consulted.  Echocardiogram did not show any acute changes.  Patient was started on aspirin to be taken 3 times daily for next [redacted] weeks along with PPI and colchicine to be taken for 3 months daily.  Outpatient cardiology follow-up arrangements to be made by their service.  In the meantime patient can also follow-up outpatient with primary care provider in 1-2 weeks.  Patient needs repeat lab work in about 1 week. CTA of the chest was negative for PE.  ESR and CRP were elevated. Patient has  reached maximal effect from hospital stay and stable for discharge  Discharge Diagnoses:  Principal Problem:   Ischemic chest pain ([place]) Active Problems:   Lumbosacral spondylosis without myelopathy   Diabetes mellitus type II, controlled ([place])   Gastroesophageal reflux disease without esophagitis   OSA on CPAP   Chest pain Chest pain secondary to acute pericarditis -Patient started on aspirin for 2 weeks 3 times daily along with colchicine to be taken for 3 months.  PPI has been prescribed to be taken daily as well especially while on aspirin. -Echocardiogram does not show an acute changes. -Patient seen by cardiology Elevated d-dimer -CTA of the chest is negative for pulmonary embolis Incidental finding of thoracic aortic aneurysm-4 cm - Follow-up outpatient primary care provider GERD -PPI Diabetes mellitus type 2 -Resume home medications.  On metformin 500 mg twice daily Essential hypertension -Continue lisinopril 5 mg daily.  Blood pressure is slightly elevated, should follow-up outpatient with primary care provider for further adjustment. Hyperlipidemia -Continue statin  Lab Results  Component Value Date   HGBA1C 6.8 (H) 10/31/2018   He is taking high dose aspirin for 2 weeks and colchicine for 3 months The CP is resolved He has continued to take his keflex for his prostate infection on the advice of his urologist He is not having diarrhea for once- he seems to be having constipation now.   It seems like he has the diarrhea more when he is traveling- ? Could be  due to anxiety with traveling This is really a quality of life issue for [place].  He and his wife are somewhat afraid to travel now.  He would like to be referred to [place] for second opinion, this is certainly fine.  CT angio done  IMPRESSION: No evidence of pulmonary embolism. Minimal scattered atelectasis in both lungs without infiltrate. 12 x 11 mm RIGHT adrenal adenoma. Additional 12 x 10 mm  indeterminate LEFT adrenal nodule. Aneurysmal dilatation ascending thoracic aorta 4.1 cm diameter recommendation below.  Recommend annual imaging followup by CTA or MRA. This recommendation follows 2010 ACCF/AHA/AATS/ACR/ASA/SCA/SCAI/SIR/STS/SVM Guidelines for the Diagnosis and Management of Patients with Thoracic Aortic Disease. Circulation. 2010; 121: P233-A076  Discussed his thoracic aortic aneurysm- repeat imaging in one year   . Patient Active Problem List   Diagnosis Date Noted  . Chronic kidney disease 11/25/2018  . Ischemic chest pain ([place]) 11/25/2018  . Chest pain 11/25/2018  . Pelvic floor dysfunction 04/04/2018  . Change in bowel habits 04/04/2018  . OSA on CPAP 01/12/2018  . REM behavioral disorder 01/12/2018  . Diarrhea 09/15/2017  . Incontinence of feces 09/15/2017  . Peripheral neuropathy 10/20/2016  . Bladder outlet obstruction 05/02/2016  . OA (osteoarthritis) of knee 05/27/2015  . Diabetes mellitus type II, controlled ([place]) 11/26/2014  . Gastroesophageal reflux disease without esophagitis 11/26/2014  . Arthritis of both knees 11/26/2014  . Absence of bladder continence 11/26/2014  . Hyperlipidemia LDL goal <100 11/26/2014  . Acute medial meniscal tear 07/16/2014  . Lumbosacral spondylosis without myelopathy 02/17/2014  . Spondylosis, lumbosacral 02/17/2014    Past Medical History:  Diagnosis Date  . Arthritis    both knees  . Back pain at L4-L5 level   . Basal cell carcinoma   . BPH (benign prostatic hyperplasia)    hx s/p turp  . Chronic back pain    spondylosis  . Chronic kidney disease    kidney stones 1979  . DDD (degenerative disc disease), lumbar   . Diabetes mellitus    takes Metformin and Victoza daily-under control  . Diarrhea    x 1 today  . Diverticulitis   . Gallstones   . GERD (gastroesophageal reflux disease)    takes Omeprazole daily-under control  . H/O hiatal hernia   . H/O measles   . Hepatitis B    B-with Mono and  jaundice 1960  . History of chicken pox   . History of colon polyps   . History of kidney stones   . Hyperlipidemia    takes Simvastatin and Tricor daily-under control  . Hypertension    takes Lisinopril daily-under control  . Joint pain    both knees  . Joint swelling    rt knee  . Kidney stones   . Melanoma ([place])    right hand;basal cell carcinoma  . Neuropathy   . Peripheral vascular disease ([place])    diabetic neuropathy  . Pneumonia    hx of  . Prostate infection    currently taking macrobid  . Sleep apnea with use of continuous positive airway pressure (CPAP)   . Tinnitus   . Urinary incontinence   . UTI (lower urinary tract infection)     Past Surgical History:  Procedure Laterality Date  . BASAL CELL CARCINOMA EXCISION    . BOTOX INJECTION  03/22/2017   In the bladder for nightly incontinence.  . C5 and T1 bone chip removed   1986  . [place]  .  CHOLECYSTECTOMY  1993  . COLONOSCOPY  2011  . cortisone injection    . EYE SURGERY     cataract sx 2017 bilateral  . I&D left knee Left 1958   states 22 times  . KNEE ARTHROSCOPY Right 07/16/2014   Procedure: RIGHT ARTHROSCOPY KNEE WITH Medial and Lateral DEBRIDEMENT and chondroplasty;  Surgeon: Gearlean Alf, MD;  Location: [facility];  Service: Orthopedics;  Laterality: Right;  . LUMBAR LAMINECTOMY/DECOMPRESSION MICRODISCECTOMY Right 02/17/2014   Procedure: RIGHT LUMBAR TWO-THREE LAMINECTOMY;  Surgeon: Charlie Pitter, MD;  Location: [place] NEURO ORS;  Service: Neurosurgery;  Laterality: Right;  right   . right shoulder surgery  2012   rotator cuff repair  . SHOULDER ARTHROSCOPY W/ ROTATOR CUFF REPAIR Left NOV 2010  . TONSILLECTOMY  1942  . TOTAL KNEE ARTHROPLASTY Right 05/27/2015   Procedure: RIGHT TOTAL KNEE ARTHROPLASTY;  Surgeon: Gaynelle Arabian, MD;  Location: [facility];  Service: Orthopedics;  Laterality: Right;  . TOTAL KNEE ARTHROPLASTY Left 05/30/2016   Procedure: LEFT TOTAL KNEE ARTHROPLASTY;  Surgeon:  Gaynelle Arabian, MD;  [facility]: [facility];  Service: Orthopedics;  Laterality: Left;  . TRANSURETHRAL RESECTION OF PROSTATE  2006  . VASECTOMY  1983  . WISDOM TOOTH EXTRACTION      Social History   Tobacco Use  . Smoking status: Former Smoker    Types: Pipe    Last attempt to quit: 07/14/1978    Years since quitting: 40.4  . Smokeless tobacco: Never Used  . Tobacco comment: quit smoking 1979  Substance Use Topics  . Alcohol use: Yes    Comment: OCCASIONAL  . Drug use: No    Family History  Problem Relation Age of Onset  . Heart disease Mother 11       Deceased  . Bladder Cancer Mother   . Breast cancer Mother   . Prostate cancer Father        Deceased-in 29s  . Healthy Brother   . Skin cancer Brother        "basal cell cancer in the bloodstream"  . Asthma Sister        Deceased  . Healthy Son        #1  . Healthy Daughter        #2  . Obesity Son        #2    Allergies  Allergen Reactions  . Albamycin [Novobiocin] Other (See Comments)    Made me look like a strawberry   . Penicillins Rash and Other (See Comments)    Has patient had a PCN reaction causing immediate rash, facial/tongue/throat swelling, SOB or lightheadedness with hypotension:  no Has patient had a PCN reaction causing severe rash involving mucus membranes or skin necrosis: no Has patient had a PCN reaction that required hospitalization no Has patient had a PCN reaction occurring within the last 10 years: no - childhood reaction If all of the above answers are "NO", then may proceed with Cephalosporin use.     Medication list has been reviewed and updated.  Current Outpatient Medications on File Prior to Visit  Medication Sig Dispense Refill  . aspirin 325 MG tablet Take 3 tablets (975 mg total) by mouth 3 (three) times daily for 14 days. 126 tablet 0  . CALCIUM-MAGNESIUM-VITAMIN D PO Take 2 tablets by mouth 2 (two) times daily.     . clonazePAM (KLONOPIN) 0.5 MG tablet Take 1/2 or 1 qhs prn sleep  (Patient taking differently: Take 0.5 mg by mouth  at bedtime. ) 90 tablet 0  . colchicine 0.6 MG tablet Take 1 tablet (0.6 mg total) by mouth daily. 30 tablet 2  . FIBER PO Take 3 capsules by mouth 2 (two) times daily.     [name] Kitchen lisinopril (PRINIVIL,ZESTRIL) 5 MG tablet TAKE 1 TABLET EVERY EVENING (Patient taking differently: Take 5 mg by mouth every evening. ) 90 tablet 3  . Melatonin 10 MG TABS Take 1 tablet by mouth at bedtime.    . Multiple Vitamin (MULTI-VITAMINS) TABS Take 1 tablet by mouth daily.     [name] Kitchen omeprazole (PRILOSEC) 20 MG capsule TAKE 1 CAPSULE DAILY (Patient taking differently: Take 20 mg by mouth every morning. ) 90 capsule 3  . Probiotic Product (PROBIOTIC ADVANCED PO) Take 1 tablet by mouth daily.     . simvastatin (ZOCOR) 10 MG tablet Take 1 tablet (10 mg total) by mouth at bedtime. 90 tablet 1  . TRICOR 145 MG tablet TAKE 1 TABLET EVERY EVENING (Patient taking differently: Take 145 mg by mouth every evening. ) 90 tablet 4  . vitamin C (ASCORBIC ACID) 500 MG tablet Take 500 mg by mouth daily.     No current facility-administered medications on file prior to visit.     Review of Systems:  As per HPI- otherwise negative.  No fever or chills No CP or SOB  Physical Examination: Vitals:   11/29/18 1146  BP: 140/70  Pulse: 73  Resp: 18  SpO2: 99%   Vitals:   11/29/18 1146  Weight: 229 lb (103.9 kg)  Height: 5' 11"  (1.803 m)   Body mass index is 31.94 kg/m. Ideal Body Weight: Weight in (lb) to have BMI = 25: 178.9  GEN: WDWN, NAD, Non-toxic, A & O x 3, obese, looks well. HEENT: Atraumatic, Normocephalic. Neck supple. No masses, No LAD. Ears and Nose: No external deformity. CV: RRR, No M/G/R. No JVD. No thrill. No extra heart sounds. PULM: CTA B, no wheezes, crackles, rhonchi. No retractions. No resp. distress. No accessory muscle use. EXTR: No c/c/e NEURO Normal gait, uses a cane PSYCH: Normally interactive. Conversant. Not depressed or anxious appearing.  Calm  demeanor.    Assessment and Plan: Hospital discharge follow-up - Plan: CBC, Basic metabolic panel  Functional diarrhea - Plan: Ambulatory referral to Gastroenterology  Aspirin long-term use - Plan: CBC  Acute idiopathic pericarditis - Plan: CBC  Thoracic aortic aneurysm without rupture ([place]) - Plan: CT Angio Chest W/Cm &/Or Wo Cm  Following up from hospital stay.  Patient was admitted with chest pain, which turned out to be pericarditis.  He is doing much better now with colchicine and high-dose aspirin.  He has an appointment to see cardiology at the end of the month. Ordered a CBC and BMP, he will return to have this drawn next week. Referral to gastroenterology with [facility] in [place].  He is seeking a second opinion about his troublesome diarrhea. He was incidentally noted to have a thoracic aortic aneurysm on CT angiogram.  A one-year follow-up was recommended, I ordered this for him today.  Signed Lamar Blinks, MD

## 2018-11-29 ENCOUNTER — Encounter: Payer: Self-pay | Admitting: Family Medicine

## 2018-11-29 ENCOUNTER — Ambulatory Visit (INDEPENDENT_AMBULATORY_CARE_PROVIDER_SITE_OTHER): Payer: Medicare Other | Admitting: Family Medicine

## 2018-11-29 VITALS — BP 140/70 | HR 73 | Resp 18 | Ht 71.0 in | Wt 229.0 lb

## 2018-11-29 DIAGNOSIS — I712 Thoracic aortic aneurysm, without rupture, unspecified: Secondary | ICD-10-CM

## 2018-11-29 DIAGNOSIS — Z09 Encounter for follow-up examination after completed treatment for conditions other than malignant neoplasm: Secondary | ICD-10-CM | POA: Diagnosis not present

## 2018-11-29 DIAGNOSIS — I300 Acute nonspecific idiopathic pericarditis: Secondary | ICD-10-CM | POA: Diagnosis not present

## 2018-11-29 DIAGNOSIS — Z7982 Long term (current) use of aspirin: Secondary | ICD-10-CM

## 2018-11-29 DIAGNOSIS — K591 Functional diarrhea: Secondary | ICD-10-CM

## 2018-11-29 NOTE — Patient Instructions (Addendum)
We will plan to do a CT scan in a year to look at the thoracic aortic aneurysm  - I will go ahead and order this but placing orders so far in advance can be tricky, so please make a note for yourself as well  We referred you to [facility] in [place] for a 2nd opinion  Please make a lab appt for Monday next week   Let me know if any problems!

## 2018-12-03 ENCOUNTER — Other Ambulatory Visit (INDEPENDENT_AMBULATORY_CARE_PROVIDER_SITE_OTHER): Payer: Medicare Other

## 2018-12-03 ENCOUNTER — Encounter: Payer: Self-pay | Admitting: Family Medicine

## 2018-12-03 DIAGNOSIS — Z7982 Long term (current) use of aspirin: Secondary | ICD-10-CM | POA: Diagnosis not present

## 2018-12-03 DIAGNOSIS — I300 Acute nonspecific idiopathic pericarditis: Secondary | ICD-10-CM | POA: Diagnosis not present

## 2018-12-03 DIAGNOSIS — Z09 Encounter for follow-up examination after completed treatment for conditions other than malignant neoplasm: Secondary | ICD-10-CM | POA: Diagnosis not present

## 2018-12-03 LAB — BASIC METABOLIC PANEL
BUN: 18 mg/dL (ref 6–23)
CO2: 32 mEq/L (ref 19–32)
Calcium: 9.9 mg/dL (ref 8.4–10.5)
Chloride: 105 mEq/L (ref 96–112)
Creatinine, Ser: 1.05 mg/dL (ref 0.40–1.50)
GFR: 72.11 mL/min (ref >60.00)
Glucose, Bld: 114 mg/dL — ABNORMAL HIGH (ref 70–99)
Potassium: 4.7 mEq/L (ref 3.5–5.1)
Sodium: 143 mEq/L (ref 135–145)

## 2018-12-03 LAB — CBC
HCT: 39.5 % (ref 39.0–52.0)
Hemoglobin: 12.8 g/dL — ABNORMAL LOW (ref 13.0–17.0)
MCHC: 32.4 g/dL (ref 30.0–36.0)
MCV: 85.6 fl (ref 78.0–100.0)
Platelets: 275 10*3/uL (ref 150.0–400.0)
RBC: 4.61 Mil/uL (ref 4.22–5.81)
RDW: 14.5 % (ref 11.5–15.5)
WBC: 6.2 10*3/uL (ref 4.0–10.5)

## 2018-12-08 NOTE — Progress Notes (Signed)
Cardiology Office Note   Date:  12/10/2018   ID:  Matthew Nash, DOB [birth year], MRN [id number]  PCP:  Darreld Mclean, MD  Cardiologist:  Dr. Harrell Gave  Chief Complaint  Patient presents with  . Hospitalization Follow-up  . Chest Pain    Pericarditis      History of Present Illness: Matthew Nash is a 82 y.o. male who presents for hospital follow-up after being seen on consultation on 11/26/2018 by Dr. Harrell Gave for chest pain, worsened with breathing.  Other history to include hypercholesterolemia.  The patient was diagnosed with pericarditis in the setting of symptoms and elevated CRP with EKG findings revealing elevated T waves across the pericardial leads, with findings most prominent in lead II, aVF, V6, with PR depression as well.  Echocardiogram did not reveal pericardial effusion, with EF of 60% to 65% with normal wall motion, without regional wall motion abnormalities.  During hospitalization, the patient had been started on colchicine, he was also continued on aspirin 975 mg 3 times daily.  He is here for posthospitalization follow-up concerning his symptoms.  It was noted that he was having some diarrhea on the colchicine.  He is without further complaints of chest discomfort, no further pain with inspiration. He continues on colchicine and ASA.   Past Medical History:  Diagnosis Date  . Arthritis    both knees  . Back pain at L4-L5 level   . Basal cell carcinoma   . BPH (benign prostatic hyperplasia)    hx s/p turp  . Chronic back pain    spondylosis  . Chronic kidney disease    kidney stones 1979  . DDD (degenerative disc disease), lumbar   . Diabetes mellitus    takes Metformin and Victoza daily-under control  . Diarrhea    x 1 today  . Diverticulitis   . Gallstones   . GERD (gastroesophageal reflux disease)    takes Omeprazole daily-under control  . H/O hiatal hernia   . H/O measles   . Hepatitis B    B-with Mono and jaundice 1960  . History of  chicken pox   . History of colon polyps   . History of kidney stones   . Hyperlipidemia    takes Simvastatin and Tricor daily-under control  . Hypertension    takes Lisinopril daily-under control  . Joint pain    both knees  . Joint swelling    rt knee  . Kidney stones   . Melanoma ([place])    right hand;basal cell carcinoma  . Neuropathy   . Peripheral vascular disease ([place])    diabetic neuropathy  . Pneumonia    hx of  . Prostate infection    currently taking macrobid  . Sleep apnea with use of continuous positive airway pressure (CPAP)   . Tinnitus   . Urinary incontinence   . UTI (lower urinary tract infection)     Past Surgical History:  Procedure Laterality Date  . BASAL CELL CARCINOMA EXCISION    . BOTOX INJECTION  03/22/2017   In the bladder for nightly incontinence.  . C5 and T1 bone chip removed   1986  . [place]  . CHOLECYSTECTOMY  1993  . COLONOSCOPY  2011  . cortisone injection    . EYE SURGERY     cataract sx 2017 bilateral  . I&D left knee Left 1958   states 22 times  . KNEE ARTHROSCOPY Right 07/16/2014   Procedure: RIGHT ARTHROSCOPY KNEE WITH  Medial and Lateral DEBRIDEMENT and chondroplasty;  Surgeon: Gearlean Alf, MD;  [facility];  Service: Orthopedics;  Laterality: Right;  . LUMBAR LAMINECTOMY/DECOMPRESSION MICRODISCECTOMY Right 02/17/2014   Procedure: RIGHT LUMBAR TWO-THREE LAMINECTOMY;  Surgeon: Charlie Pitter, MD;  Location: [place] NEURO ORS;  Service: Neurosurgery;  Laterality: Right;  right   . right shoulder surgery  2012   rotator cuff repair  . SHOULDER ARTHROSCOPY W/ ROTATOR CUFF REPAIR Left NOV 2010  . TONSILLECTOMY  1942  . TOTAL KNEE ARTHROPLASTY Right 05/27/2015   Procedure: RIGHT TOTAL KNEE ARTHROPLASTY;  Surgeon: Gaynelle Arabian, MD;  [facility];  Service: Orthopedics;  Laterality: Right;  . TOTAL KNEE ARTHROPLASTY Left 05/30/2016   Procedure: LEFT TOTAL KNEE ARTHROPLASTY;  Surgeon: Gaynelle Arabian, MD;  [facility];  Service: Orthopedics;  Laterality: Left;  . TRANSURETHRAL RESECTION OF PROSTATE  2006  . VASECTOMY  1983  . WISDOM TOOTH EXTRACTION       Current Outpatient Medications  Medication Sig Dispense Refill  . aspirin 81 MG tablet Take 1 tablet (81 mg total) by mouth 3 (three) times daily for 14 days. 30 tablet 0  . CALCIUM-MAGNESIUM-VITAMIN D PO Take 2 tablets by mouth 2 (two) times daily.     . cephALEXin (KEFLEX) 500 MG capsule Take 500 mg by mouth daily.    . clonazePAM (KLONOPIN) 0.5 MG tablet Take 1/2 or 1 qhs prn sleep (Patient taking differently: Take 0.5 mg by mouth at bedtime. ) 90 tablet 0  . colchicine 0.6 MG tablet Take 1 tablet (0.6 mg total) by mouth daily. 30 tablet 2  . FIBER PO Take 3 capsules by mouth 2 (two) times daily.     [name] Kitchen lisinopril (PRINIVIL,ZESTRIL) 5 MG tablet TAKE 1 TABLET EVERY EVENING (Patient taking differently: Take 5 mg by mouth every evening. ) 90 tablet 3  . Melatonin 10 MG TABS Take 1 tablet by mouth at bedtime.    . Multiple Vitamin (MULTI-VITAMINS) TABS Take 1 tablet by mouth daily.     [name] Kitchen omeprazole (PRILOSEC) 20 MG capsule TAKE 1 CAPSULE DAILY (Patient taking differently: Take 20 mg by mouth every morning. ) 90 capsule 3  . Probiotic Product (PROBIOTIC ADVANCED PO) Take 1 tablet by mouth daily.     . simvastatin (ZOCOR) 10 MG tablet Take 1 tablet (10 mg total) by mouth at bedtime. 90 tablet 1  . TRICOR 145 MG tablet TAKE 1 TABLET EVERY EVENING (Patient taking differently: Take 145 mg by mouth every evening. ) 90 tablet 4  . vitamin C (ASCORBIC ACID) 500 MG tablet Take 500 mg by mouth daily.     No current facility-administered medications for this visit.     Allergies:   Albamycin [novobiocin] and Penicillins    Social History:  The patient  reports that he quit smoking about 40 years ago. His smoking use included pipe. He has never used smokeless tobacco. He reports current alcohol use. He reports that he does not use drugs.   Family History:   The patient's family history includes Asthma in his sister; Bladder Cancer in his mother; Breast cancer in his mother; Healthy in his brother, daughter, and son; Heart disease (age of onset: 21) in his mother; Obesity in his son; Prostate cancer in his father; Skin cancer in his brother.    ROS: All other systems are reviewed and negative. Unless otherwise mentioned in H&P    PHYSICAL EXAM: VS:  BP 130/68   Pulse 78  Ht 5\' 11"  (1.803 m)   Wt 233 lb (105.7 kg)   SpO2 92%   BMI 32.50 kg/m  , BMI Body mass index is 32.5 kg/m. GEN: Well nourished, well developed, in no acute distress HEENT: normal Neck: no JVD, carotid bruits, or masses Cardiac:RRR; no murmurs, rubs, or gallops,no edema  Respiratory:  Clear to auscultation bilaterally, normal work of breathing GI: soft, nontender, nondistended, + BS MS: no deformity or atrophy, bilateral leg braces. Walks with cane  Skin: warm and dry, no rash Neuro:  Strength and sensation are intact Psych: euthymic mood, full affect   EKG: Not completed this office visit.   Recent Labs: 10/31/2018: ALT 13 09-Dec-2018: Magnesium 1.7 12/03/2018: BUN 18; Creatinine, Ser 1.05; Hemoglobin 12.8; Platelets 275.0; Potassium 4.7; Sodium 143    Lipid Panel    Component Value Date/Time   CHOL 117 10/31/2018 1420   TRIG 138.0 10/31/2018 1420   HDL 44.60 10/31/2018 1420   CHOLHDL 3 10/31/2018 1420   VLDL 27.6 10/31/2018 1420   LDLCALC 45 10/31/2018 1420      Wt Readings from Last 3 Encounters:  12/10/18 233 lb (105.7 kg)  11/29/18 229 lb (103.9 kg)  11/25/18 225 lb (102.1 kg)      Other studies Reviewed: Echocardiogram December 09, 2018 Left ventricle: The cavity size was normal. Systolic function was   normal. The estimated ejection fraction was in the range of 60%   to 65%. Wall motion was normal; there were no regional wall   motion abnormalities. - Aortic valve: Valve area (VTI): 2.54 cm^2. Valve area (Vmax):   2.71 cm^2. Valve area  (Vmean): 2.74 cm^2. - Atrial septum: No defect or patent foramen ovale was identified.   ASSESSMENT AND PLAN:  1. Pericarditis: Continues on antiinflammatories with colchicine and ASA. He will reduce his ASA to 81 mg tomorrow and continue the colchicine for 2 more months. He is to call for recurrent symptoms.   2.  Hypercholesterolemia: On simvastatin and Tricor. Labs per PCP  3. Chronic UTI: He is on daily Keflex per his urologist.    4. Hypertension:  BP is well controlled currently. Continue lisinopril No changes   Current medicines are reviewed at length with the patient today.    Labs/ tests ordered today include: None   Matthew Nash, ANP, [facility]   12/10/2018 10:28 AM    [facility] [address] Office [phone] Fax [phone]

## 2018-12-10 ENCOUNTER — Encounter: Payer: Self-pay | Admitting: Adult Health

## 2018-12-10 ENCOUNTER — Ambulatory Visit (INDEPENDENT_AMBULATORY_CARE_PROVIDER_SITE_OTHER): Payer: Medicare Other | Admitting: Adult Health

## 2018-12-10 VITALS — BP 130/68 | HR 78 | Ht 71.0 in | Wt 233.0 lb

## 2018-12-10 DIAGNOSIS — E7800 Pure hypercholesterolemia, unspecified: Secondary | ICD-10-CM

## 2018-12-10 DIAGNOSIS — I300 Acute nonspecific idiopathic pericarditis: Secondary | ICD-10-CM | POA: Diagnosis not present

## 2018-12-10 DIAGNOSIS — I10 Essential (primary) hypertension: Secondary | ICD-10-CM

## 2018-12-10 MED ORDER — ASPIRIN 81 MG PO TABS: 81.0000 mg | ORAL_TABLET | Freq: Three times a day (TID) | ORAL | 0 refills | Status: AC

## 2018-12-10 NOTE — Patient Instructions (Addendum)
Follow-Up: You will need a follow up appointment in 3 months. You may see Buford Dresser, MD or one of the following Advanced Practice Providers on your designated Care Team:  Jory Sims, DNP, [facility]  Rosaria Ferries, PA-C  Medication Instructions:  START ASPIRIN 81MG  DAILY STARTING TOMORROW  Please refer to the Current Medication list given to you today. If you need a refill on your cardiac medications before your next appointment, please call your pharmacy.  Labwork: When you have labs (blood work) and your tests are completely normal, you will receive your results ONLY by [facility] (if you have MyChart) -OR- A paper copy in the mail.  At [facility], you and your health needs are our priority.  As part of our continuing mission to provide you with exceptional heart care, we have created designated Provider Care Teams.  These Care Teams include your primary Cardiologist (physician) and Advanced Practice Providers (APPs -  Physician Assistants and Nurse Practitioners) who all work together to provide you with the care you need, when you need it.  Thank you for choosing [facility] at [facility]!!

## 2018-12-14 ENCOUNTER — Encounter: Payer: Self-pay | Admitting: Family Medicine

## 2019-01-15 DIAGNOSIS — K5909 Other constipation: Secondary | ICD-10-CM | POA: Diagnosis not present

## 2019-01-15 DIAGNOSIS — R159 Full incontinence of feces: Secondary | ICD-10-CM | POA: Diagnosis not present

## 2019-01-15 DIAGNOSIS — N200 Calculus of kidney: Secondary | ICD-10-CM | POA: Diagnosis not present

## 2019-01-28 ENCOUNTER — Other Ambulatory Visit: Payer: Self-pay | Admitting: Family Medicine

## 2019-01-28 DIAGNOSIS — E119 Type 2 diabetes mellitus without complications: Secondary | ICD-10-CM

## 2019-02-13 ENCOUNTER — Encounter: Payer: Self-pay | Admitting: Family Medicine

## 2019-02-25 ENCOUNTER — Encounter: Payer: Self-pay | Admitting: Family Medicine

## 2019-02-25 DIAGNOSIS — K644 Residual hemorrhoidal skin tags: Secondary | ICD-10-CM

## 2019-03-12 ENCOUNTER — Ambulatory Visit: Payer: Medicare Other | Admitting: Adult Health

## 2019-03-12 ENCOUNTER — Ambulatory Visit: Payer: Medicare Other | Admitting: Cardiology

## 2019-03-19 ENCOUNTER — Other Ambulatory Visit: Payer: Self-pay

## 2019-03-19 DIAGNOSIS — G4700 Insomnia, unspecified: Secondary | ICD-10-CM

## 2019-03-19 MED ORDER — CLONAZEPAM 0.5 MG PO TABS: 0.5000 mg | ORAL_TABLET | Freq: Every day | ORAL | 0 refills | Status: DC

## 2019-03-31 NOTE — Progress Notes (Addendum)
[facility] [address], [place], [place] [address] [phone]  Date:  04/01/2019   Name:  Matthew Nash   DOB:  02-09-1936   MRN:  [id number]  PCP:  Darreld Mclean, MD    Chief Complaint: Weakness in Hand   History of Present Illness:  Matthew Nash is a 83 y.o. very pleasant male patient who presents with the following:  Virtual visit today due to pandemic- however converted to live visit due to issue  No fever, no ST, no cough He has not been exposed to Covid 19 per his knowledge  He has noted a problem with his right hand for a month or so.  He cannot do "the scout salute"- difficult to bring the thumb and pinky together  He is right-handed, and is having some difficulty using the hand in his daily life.  The hand is not numb, but has some tremor and says he cannot control his fingers totally No other new neurologic symptoms He does suffer from significant peripheral neuropathy, and foot drop  I most recently saw [facility] in December following overnight admission for chest pain He has history of GERD, diet controlled diabetes, BPH, OSA Who was noted to have a thoracic aortic aneurysm on CT done during admission.  This needs to be followed up in December-this scan is already been ordered  He also has had a complaint of diarrhea which has affected his quality of life We had him stop metformin in hopes that it might help-he has also been to see GI.  He is now using MiraLAX to prevent constipation, and notes that his bowel function is actually better  Lab Results  Component Value Date   HGBA1C 6.8 (H) 10/31/2018  Due for foot exam Due for eye exam  Clonazepam 0.5 at bedtime- he is taking 1/2 at night with melatonin.  This is working well - he is not acting out during his sleep like he was in the past  Lisinopril 5 Zocor 10 TriCor 145 daily  He saw a neurologist at [facility] a year ago -however the particular  doctor who he saw has left the practice.  Patient Active Problem List   Diagnosis Date Noted  . Chronic kidney disease 11/25/2018  . Ischemic chest pain ([place]) 11/25/2018  . Chest pain 11/25/2018  . Pelvic floor dysfunction 04/04/2018  . Change in bowel habits 04/04/2018  . OSA on CPAP 01/12/2018  . REM behavioral disorder 01/12/2018  . Diarrhea 09/15/2017  . Incontinence of feces 09/15/2017  . Peripheral neuropathy 10/20/2016  . Bladder outlet obstruction 05/02/2016  . OA (osteoarthritis) of knee 05/27/2015  . Diabetes mellitus type II, controlled ([place]) 11/26/2014  . Gastroesophageal reflux disease without esophagitis 11/26/2014  . Arthritis of both knees 11/26/2014  . Absence of bladder continence 11/26/2014  . Hyperlipidemia LDL goal <100 11/26/2014  . Acute medial meniscal tear 07/16/2014  . Lumbosacral spondylosis without myelopathy 02/17/2014  . Spondylosis, lumbosacral 02/17/2014    Past Medical History:  Diagnosis Date  . Arthritis    both knees  . Back pain at L4-L5 level   . Basal cell carcinoma   . BPH (benign prostatic hyperplasia)    hx s/p turp  . Chronic back pain    spondylosis  . Chronic kidney disease    kidney stones 1979  . DDD (degenerative disc disease), lumbar   . Diabetes mellitus    takes Metformin and  Victoza daily-under control  . Diarrhea    x 1 today  . Diverticulitis   . Gallstones   . GERD (gastroesophageal reflux disease)    takes Omeprazole daily-under control  . H/O hiatal hernia   . H/O measles   . Hepatitis B    B-with Mono and jaundice 1960  . History of chicken pox   . History of colon polyps   . History of kidney stones   . Hyperlipidemia    takes Simvastatin and Tricor daily-under control  . Hypertension    takes Lisinopril daily-under control  . Joint pain    both knees  . Joint swelling    rt knee  . Kidney stones   . Melanoma ([place])    right hand;basal cell carcinoma  . Neuropathy   . Peripheral vascular  disease ([place])    diabetic neuropathy  . Pneumonia    hx of  . Prostate infection    currently taking macrobid  . Sleep apnea with use of continuous positive airway pressure (CPAP)   . Tinnitus   . Urinary incontinence   . UTI (lower urinary tract infection)     Past Surgical History:  Procedure Laterality Date  . BASAL CELL CARCINOMA EXCISION    . BOTOX INJECTION  03/22/2017   In the bladder for nightly incontinence.  . C5 and T1 bone chip removed   1986  . [place]  . CHOLECYSTECTOMY  1993  . COLONOSCOPY  2011  . cortisone injection    . EYE SURGERY     cataract sx 2017 bilateral  . I&D left knee Left 1958   states 22 times  . KNEE ARTHROSCOPY Right 07/16/2014   Procedure: RIGHT ARTHROSCOPY KNEE WITH Medial and Lateral DEBRIDEMENT and chondroplasty;  Surgeon: Gearlean Alf, MD;  Location: [facility];  Service: Orthopedics;  Laterality: Right;  . LUMBAR LAMINECTOMY/DECOMPRESSION MICRODISCECTOMY Right 02/17/2014   Procedure: RIGHT LUMBAR TWO-THREE LAMINECTOMY;  Surgeon: Charlie Pitter, MD;  Location: [facility];  Service: Neurosurgery;  Laterality: Right;  right   . right shoulder surgery  2012   rotator cuff repair  . SHOULDER ARTHROSCOPY W/ ROTATOR CUFF REPAIR Left NOV 2010  . TONSILLECTOMY  1942  . TOTAL KNEE ARTHROPLASTY Right 05/27/2015   Procedure: RIGHT TOTAL KNEE ARTHROPLASTY;  Surgeon: Gaynelle Arabian, MD;  Location: [facility];  Service: Orthopedics;  Laterality: Right;  . TOTAL KNEE ARTHROPLASTY Left 05/30/2016   Procedure: LEFT TOTAL KNEE ARTHROPLASTY;  Surgeon: Gaynelle Arabian, MD;  Location: [facility];  Service: Orthopedics;  Laterality: Left;  . TRANSURETHRAL RESECTION OF PROSTATE  2006  . VASECTOMY  1983  . WISDOM TOOTH EXTRACTION      Social History   Tobacco Use  . Smoking status: Former Smoker    Types: Pipe    Last attempt to quit: 07/14/1978    Years since quitting: 40.7  . Smokeless tobacco: Never Used  . Tobacco comment: quit smoking 1979   Substance Use Topics  . Alcohol use: Yes    Comment: OCCASIONAL  . Drug use: No    Family History  Problem Relation Age of Onset  . Heart disease Mother 49       Deceased  . Bladder Cancer Mother   . Breast cancer Mother   . Prostate cancer Father        Deceased-in 74s  . Healthy Brother   . Skin cancer Brother        "basal cell cancer  in the bloodstream"  . Asthma Sister        Deceased  . Healthy Son        #1  . Healthy Daughter        #2  . Obesity Son        #2    Allergies  Allergen Reactions  . Albamycin [Novobiocin] Other (See Comments)    Made me look like a strawberry   . Penicillins Rash and Other (See Comments)    Has patient had a PCN reaction causing immediate rash, facial/tongue/throat swelling, SOB or lightheadedness with hypotension:  no Has patient had a PCN reaction causing severe rash involving mucus membranes or skin necrosis: no Has patient had a PCN reaction that required hospitalization no Has patient had a PCN reaction occurring within the last 10 years: no - childhood reaction If all of the above answers are "NO", then may proceed with Cephalosporin use.     Medication list has been reviewed and updated.  Current Outpatient Medications on File Prior to Visit  Medication Sig Dispense Refill  . CALCIUM-MAGNESIUM-VITAMIN D PO Take 2 tablets by mouth 2 (two) times daily.     . cephALEXin (KEFLEX) 500 MG capsule Take 500 mg by mouth daily.    . clonazePAM (KLONOPIN) 0.5 MG tablet Take 1 tablet (0.5 mg total) by mouth at bedtime. 90 tablet 0  . FIBER PO Take 3 capsules by mouth 2 (two) times daily.     [name] Kitchen lisinopril (PRINIVIL,ZESTRIL) 5 MG tablet Take 1 tablet (5 mg total) by mouth every evening. 90 tablet 1  . Melatonin 10 MG TABS Take 1 tablet by mouth at bedtime.    . Multiple Vitamin (MULTI-VITAMINS) TABS Take 1 tablet by mouth daily.     [name] Kitchen omeprazole (PRILOSEC) 20 MG capsule TAKE 1 CAPSULE DAILY (Patient taking differently: Take 20 mg by  mouth every morning. ) 90 capsule 3  . Polyethylene Glycol 3350 (MIRALAX PO) Take by mouth. Daily in morning    . Probiotic Product (PROBIOTIC ADVANCED PO) Take 1 tablet by mouth daily.     . simvastatin (ZOCOR) 10 MG tablet Take 1 tablet (10 mg total) by mouth at bedtime. 90 tablet 1  . TRICOR 145 MG tablet TAKE 1 TABLET EVERY EVENING (Patient taking differently: Take 145 mg by mouth every evening. ) 90 tablet 4  . vitamin C (ASCORBIC ACID) 500 MG tablet Take 500 mg by mouth daily.     No current facility-administered medications on file prior to visit.     Review of Systems:  He is not wearing his AFO braces for his foot drop right now He is using a walker He has more difficult balancing and walking   Physical Examination: Vitals:   04/01/19 1137  BP: 122/60  Pulse: 78  Resp: 20  Temp: 97.8 F (36.6 C)  SpO2: 94%   Vitals:   04/01/19 1137  Weight: 233 lb 12.8 oz (106.1 kg)  Height: 5\' 11"  (1.803 m)   Body mass index is 32.61 kg/m. Ideal Body Weight: Weight in (lb) to have BMI = 25: 178.9  GEN: WDWN, NAD, Non-toxic, A & O x 3, obese, looks well.  His normal self HEENT: Atraumatic, Normocephalic. Neck supple. No masses, No LAD. Ears and Nose: No external deformity. CV: RRR, No M/G/R. No JVD. No thrill. No extra heart sounds. PULM: CTA B, no wheezes, crackles, rhonchi. No retractions. No resp. distress. No accessory muscle use. EXTR: No c/c/e NEURO Normal gait  for patient.  He has a high-stepping gait to avoid foot drop.  He is not wearing his AFO braces today.  He is using a rolling walker PSYCH: Normally interactive. Conversant. Not depressed or anxious appearing.  Calm demeanor.  Foot exam: Hyperpigmentation consistent with chronic stasis.  Minimal edema, normal pedal pulses bilaterally.  He is not able to sense touch on the dorsum of the foot bilaterally.  He is able to sense monofilament at the heels and midfoot, but only some of the toes Right hand displays a  significant intention tremor.  He has difficulty opposing the thumb and pinky finger.  Passively the fingers and hand show normal full range of motion.  He notes normal sensation of the hand.  Hand is warm and well-perfused, with normal radial pulse.  Grip strength is somewhat reduced on the right compared with left  BP Readings from Last 3 Encounters:  04/01/19 122/60  12/10/18 130/68  11/29/18 140/70     Assessment and Plan: Tremor of right hand - Plan: B12, Folate, TSH, Ferritin, Ambulatory referral to Neurology  Functional diarrhea  Hyperlipidemia associated with type 2 diabetes mellitus ([place])  Essential hypertension - Plan: CBC, Comprehensive metabolic panel  Controlled type 2 diabetes mellitus without complication, without long-term current use of insulin ([place]) - Plan: Comprehensive metabolic panel, Hemoglobin A1c  Polyneuropathy associated with underlying disease ([place]) - Plan: B12, Folate, TSH, Ferritin, Ambulatory referral to Neurology  Numbness and tingling of both feet - Plan: B12, Folate, Ambulatory referral to Neurology  Anemia, unspecified type - Plan: CBC, Ferritin  [place] is here today to discuss a tremor and difficulty using his right hand.  This does not appear to be an orthopedic issue, as the hand has normal passive function.  I suspect this may put his peripheral neuropathy.  He has seen neurology, but his most recent neurologist left the practice. I called [facility] for him- I will send in a new referral to them  [phone]  Blood pressures under okay control Await other labs as above, to follow-up on diabetes and look for any metabolic cause of his neurologic change  Signed Lamar Blinks, MD  Received his lab results 4/22, as follows Message to patient  Results for orders placed or performed in visit on 04/01/19  CBC  Result Value Ref Range   WBC 7.8 4.0 - 10.5 K/uL   RBC 4.62 4.22 - 5.81 Mil/uL   Platelets 255.0 150.0 - 400.0 K/uL    Hemoglobin 13.0 13.0 - 17.0 g/dL   HCT 39.6 39.0 - 52.0 %   MCV 85.8 78.0 - 100.0 fl   MCHC 32.7 30.0 - 36.0 g/dL   RDW 15.0 11.5 - 15.5 %  Comprehensive metabolic panel  Result Value Ref Range   Sodium 144 135 - 145 mEq/L   Potassium 4.5 3.5 - 5.1 mEq/L   Chloride 106 96 - 112 mEq/L   CO2 31 19 - 32 mEq/L   Glucose, Bld 94 70 - 99 mg/dL   BUN 21 6 - 23 mg/dL   Creatinine, Ser 1.11 0.40 - 1.50 mg/dL   Total Bilirubin 0.6 0.2 - 1.2 mg/dL   Alkaline Phosphatase 45 39 - 117 U/L   AST 9 0 - 37 U/L   ALT 11 0 - 53 U/L   Total Protein 6.8 6.0 - 8.3 g/dL   Albumin 4.2 3.5 - 5.2 g/dL   Calcium 10.1 8.4 - 10.5 mg/dL   GFR 63.58 >60.00 mL/min  Hemoglobin A1c  Result Value Ref Range   Hgb A1c MFr Bld 7.2 (H) 4.6 - 6.5 %  B12  Result Value Ref Range   Vitamin B-12 776 211 - 911 pg/mL  Folate  Result Value Ref Range   Folate >23.3 >5.9 ng/mL  TSH  Result Value Ref Range   TSH 1.46 0.35 - 4.50 uIU/mL  Ferritin  Result Value Ref Range   Ferritin 18.2 (L) 22.0 - 322.0 ng/mL   Blood counts are normal Metabolic profile is quite normal Your A1c is fine, diabetes under good control even without metformin.  Do watch your diet, limit sweets and carbs Vitamin B12 and folate levels are normal Thyroid is normal Your ferritin-iron-level is slightly low.  You are not anemic, which is good.  I would recommend that you start an over-the-counter iron supplement perhaps every other day.  Iron pills can cause constipation, so we will need to be careful with it-I do not want upsets your bowels.  You can also increase iron intake by eating iron rich foods, or by doing some of your cooking in a cast iron pan.  I contacted your [facility] neurology practice, they should be contacting you soon.  Let me know if any questions so far, let us plan to visit in the office in about 4 months.  Let me know if any concerns or changes in the meantime

## 2019-04-01 ENCOUNTER — Ambulatory Visit (INDEPENDENT_AMBULATORY_CARE_PROVIDER_SITE_OTHER): Payer: Medicare Other | Admitting: Family Medicine

## 2019-04-01 ENCOUNTER — Telehealth: Payer: Self-pay | Admitting: *Deleted

## 2019-04-01 ENCOUNTER — Other Ambulatory Visit: Payer: Self-pay

## 2019-04-01 ENCOUNTER — Encounter: Payer: Self-pay | Admitting: Family Medicine

## 2019-04-01 VITALS — BP 122/60 | HR 78 | Temp 97.8°F | Resp 20 | Ht 71.0 in | Wt 233.8 lb

## 2019-04-01 DIAGNOSIS — R200 Anesthesia of skin: Secondary | ICD-10-CM | POA: Diagnosis not present

## 2019-04-01 DIAGNOSIS — I10 Essential (primary) hypertension: Secondary | ICD-10-CM

## 2019-04-01 DIAGNOSIS — E1169 Type 2 diabetes mellitus with other specified complication: Secondary | ICD-10-CM

## 2019-04-01 DIAGNOSIS — E119 Type 2 diabetes mellitus without complications: Secondary | ICD-10-CM

## 2019-04-01 DIAGNOSIS — R251 Tremor, unspecified: Secondary | ICD-10-CM

## 2019-04-01 DIAGNOSIS — K591 Functional diarrhea: Secondary | ICD-10-CM

## 2019-04-01 DIAGNOSIS — E785 Hyperlipidemia, unspecified: Secondary | ICD-10-CM | POA: Diagnosis not present

## 2019-04-01 DIAGNOSIS — G63 Polyneuropathy in diseases classified elsewhere: Secondary | ICD-10-CM

## 2019-04-01 DIAGNOSIS — D649 Anemia, unspecified: Secondary | ICD-10-CM | POA: Diagnosis not present

## 2019-04-01 DIAGNOSIS — R202 Paresthesia of skin: Secondary | ICD-10-CM

## 2019-04-01 LAB — CBC
HCT: 39.6 % (ref 39.0–52.0)
Hemoglobin: 13 g/dL (ref 13.0–17.0)
MCHC: 32.7 g/dL (ref 30.0–36.0)
MCV: 85.8 fl (ref 78.0–100.0)
Platelets: 255 K/uL (ref 150.0–400.0)
RBC: 4.62 Mil/uL (ref 4.22–5.81)
RDW: 15 % (ref 11.5–15.5)
WBC: 7.8 K/uL (ref 4.0–10.5)

## 2019-04-01 LAB — COMPREHENSIVE METABOLIC PANEL
ALT: 11 U/L (ref 0–53)
AST: 9 U/L (ref 0–37)
Albumin: 4.2 g/dL (ref 3.5–5.2)
Alkaline Phosphatase: 45 U/L (ref 39–117)
BUN: 21 mg/dL (ref 6–23)
CO2: 31 mEq/L (ref 19–32)
Calcium: 10.1 mg/dL (ref 8.4–10.5)
Chloride: 106 mEq/L (ref 96–112)
Creatinine, Ser: 1.11 mg/dL (ref 0.40–1.50)
GFR: 63.58 mL/min (ref >60.00)
Glucose, Bld: 94 mg/dL (ref 70–99)
Potassium: 4.5 mEq/L (ref 3.5–5.1)
Sodium: 144 mEq/L (ref 135–145)
Total Bilirubin: 0.6 mg/dL (ref 0.2–1.2)
Total Protein: 6.8 g/dL (ref 6.0–8.3)

## 2019-04-01 LAB — HEMOGLOBIN A1C: Hgb A1c MFr Bld: 7.2 % — ABNORMAL HIGH (ref 4.6–6.5)

## 2019-04-01 LAB — TSH: TSH: 1.46 u[IU]/mL (ref 0.35–4.50)

## 2019-04-01 LAB — FERRITIN: Ferritin: 18.2 ng/mL — ABNORMAL LOW (ref 22.0–322.0)

## 2019-04-01 NOTE — Patient Instructions (Signed)
It was great to see you today- I will be in touch with your labs asap We will get you back into the [facility] neurology clinic who you saw a year ago-  I will give them a call  Please let me know if any change in your symptoms

## 2019-04-01 NOTE — Telephone Encounter (Signed)
Pt has telephone visit with Dr Lorelei Pont at 10:20 this morning. See below message from pt today.  Copied from [place] [phone]. Topic: Quick Communication - See Telephone Encounter >> Apr 01, 2019  8:47 AM Blase Mess A wrote: CRM for notification. See Telephone encounter for: 04/01/19.  Patient is calling to notify Dr. Lorelei Pont that he does have Skype for the vist. If it needs to be used. Please advise. CB- [phone] (H) No cell Phone.

## 2019-04-01 NOTE — Telephone Encounter (Signed)
Unable to use skype for visits. Will need to be telephone visit.

## 2019-04-02 LAB — FOLATE: Folate: >23.3 ng/mL (ref >5.9)

## 2019-04-02 LAB — VITAMIN B12: Vitamin B-12: 776 pg/mL (ref 211–911)

## 2019-04-03 ENCOUNTER — Encounter: Payer: Self-pay | Admitting: Family Medicine

## 2019-04-15 NOTE — Progress Notes (Signed)
Virtual Visit via Video Note  I connected with patient on 04/16/19 at  3:00 PM EDT by a video enabled telemedicine application and verified that I am speaking with the correct person using two identifiers.   THIS ENCOUNTER IS A VIRTUAL VISIT DUE TO COVID-19 - PATIENT WAS NOT SEEN IN THE OFFICE. PATIENT HAS CONSENTED TO VIRTUAL VISIT / TELEMEDICINE VISIT   Location of patient: home  Location of provider: office  I discussed the limitations of evaluation and management by telemedicine and the availability of in person appointments. The patient expressed understanding and agreed to proceed.   Subjective:   Matthew Nash is a 83 y.o. male who presents for Medicare Annual/Subsequent preventive examination.  Review of Systems: No ROS.  Medicare Wellness Virtual Visit. UTA vital signs.  Additional risk factors are reflected in the social history. Cardiac Risk Factors include: advanced age (>49men, >30 women);diabetes mellitus Sleep patterns: Takes Melatonin and Klonopin. Sleeps well. Home Safety/Smoke Alarms: Feels safe in home. Smoke alarms in place.  Lives with wife and mother-n-law. No stairs. Getting walking shower next Wed.  Male:   CCS-  No longer doing routine screening due to age.   PSA-  Lab Results  Component Value Date   PSA 1.36 02/15/2016       Objective:    Vitals: Unable to assess. This visit is enabled though telemedicine due to Covid 19. Pt reports vitals as BP=140/84, O2 sat=94%, HR=80bpm.  Advanced Directives 04/16/2019 11/25/2018 09/20/2017 06/21/2017 04/10/2017 06/13/2016 06/03/2016  Does Patient Have a Medical Advance Directive? Yes Yes Yes Yes Yes Yes Yes  Type of Paramedic of [place];Living will [place];Living will Living will - [place];Living will [place];Living will [place]  Does patient want to make changes to medical advance directive? No - Patient  declined - - No - Patient declined - - -  Copy of [place] in Chart? Yes - validated most recent copy scanned in chart (See row information) - - - No - copy requested - -  Would patient like information on creating a medical advance directive? - - - - - - -  Pre-existing out of facility DNR order (yellow form or pink MOST form) - - - - - - -    Tobacco Social History   Tobacco Use  Smoking Status Former Smoker  . Types: Pipe  . Last attempt to quit: 07/14/1978  . Years since quitting: 40.7  Smokeless Tobacco Never Used  Tobacco Comment   quit smoking 1979     Counseling given: Not Answered Comment: quit smoking 1979   Clinical Intake:     Pain : No/denies pain                 Past Medical History:  Diagnosis Date  . Arthritis    both knees  . Back pain at L4-L5 level   . Basal cell carcinoma   . BPH (benign prostatic hyperplasia)    hx s/p turp  . Chronic back pain    spondylosis  . Chronic kidney disease    kidney stones 1979  . DDD (degenerative disc disease), lumbar   . Diabetes mellitus    takes Metformin and Victoza daily-under control  . Diarrhea    x 1 today  . Diverticulitis   . Gallstones   . GERD (gastroesophageal reflux disease)    takes Omeprazole daily-under control  . H/O hiatal hernia   .  H/O measles   . Hepatitis B    B-with Mono and jaundice 1960  . History of chicken pox   . History of colon polyps   . History of kidney stones   . Hyperlipidemia    takes Simvastatin and Tricor daily-under control  . Hypertension    takes Lisinopril daily-under control  . Joint pain    both knees  . Joint swelling    rt knee  . Kidney stones   . Melanoma ([place])    right hand;basal cell carcinoma  . Neuropathy   . Peripheral vascular disease ([place])    diabetic neuropathy  . Pneumonia    hx of  . Prostate infection    currently taking macrobid  . Sleep apnea with use of continuous positive airway pressure (CPAP)   .  Tinnitus   . Urinary incontinence   . UTI (lower urinary tract infection)    Past Surgical History:  Procedure Laterality Date  . BASAL CELL CARCINOMA EXCISION    . BOTOX INJECTION  03/22/2017   In the bladder for nightly incontinence.  . C5 and T1 bone chip removed   1986  . [place]  . CHOLECYSTECTOMY  1993  . COLONOSCOPY  2011  . cortisone injection    . EYE SURGERY     cataract sx 2017 bilateral  . I&D left knee Left 1958   states 22 times  . KNEE ARTHROSCOPY Right 07/16/2014   Procedure: RIGHT ARTHROSCOPY KNEE WITH Medial and Lateral DEBRIDEMENT and chondroplasty;  Surgeon: Gearlean Alf, MD;  Location: [facility];  Service: Orthopedics;  Laterality: Right;  . LUMBAR LAMINECTOMY/DECOMPRESSION MICRODISCECTOMY Right 02/17/2014   Procedure: RIGHT LUMBAR TWO-THREE LAMINECTOMY;  Surgeon: Charlie Pitter, MD;  Location: [facility];  Service: Neurosurgery;  Laterality: Right;  right   . right shoulder surgery  2012   rotator cuff repair  . SHOULDER ARTHROSCOPY W/ ROTATOR CUFF REPAIR Left NOV 2010  . TONSILLECTOMY  1942  . TOTAL KNEE ARTHROPLASTY Right 05/27/2015   Procedure: RIGHT TOTAL KNEE ARTHROPLASTY;  Surgeon: Gaynelle Arabian, MD;  Location: [facility];  Service: Orthopedics;  Laterality: Right;  . TOTAL KNEE ARTHROPLASTY Left 05/30/2016   Procedure: LEFT TOTAL KNEE ARTHROPLASTY;  Surgeon: Gaynelle Arabian, MD;  Location: [facility];  Service: Orthopedics;  Laterality: Left;  . TRANSURETHRAL RESECTION OF PROSTATE  2006  . VASECTOMY  1983  . WISDOM TOOTH EXTRACTION     Family History  Problem Relation Age of Onset  . Heart disease Mother 74       Deceased  . Bladder Cancer Mother   . Breast cancer Mother   . Prostate cancer Father        Deceased-in 23s  . Healthy Brother   . Skin cancer Brother        "basal cell cancer in the bloodstream"  . Asthma Sister        Deceased  . Healthy Son        #1  . Healthy Daughter        #2  . Obesity Son        #2   Social  History   Socioeconomic History  . Marital status: Married    Spouse name: Not on file  . Number of children: 4  . Years of education: Not on file  . Highest education level: Not on file  Occupational History  . Occupation: retired  Scientific laboratory technician  . Financial resource strain: Not  on file  . Food insecurity:    Worry: Not on file    Inability: Not on file  . Transportation needs:    Medical: Not on file    Non-medical: Not on file  Tobacco Use  . Smoking status: Former Smoker    Types: Pipe    Last attempt to quit: 07/14/1978    Years since quitting: 40.7  . Smokeless tobacco: Never Used  . Tobacco comment: quit smoking 1979  Substance and Sexual Activity  . Alcohol use: Yes    Comment: OCCASIONAL  . Drug use: No  . Sexual activity: Never  Lifestyle  . Physical activity:    Days per week: Not on file    Minutes per session: Not on file  . Stress: Not on file  Relationships  . Social connections:    Talks on phone: Not on file    Gets together: Not on file    Attends religious service: Not on file    Active member of club or organization: Not on file    Attends meetings of clubs or organizations: Not on file    Relationship status: Not on file  Other Topics Concern  . Not on file  Social History Narrative  . Not on file    Outpatient Encounter Medications as of 04/16/2019  Medication Sig  . CALCIUM-MAGNESIUM-VITAMIN D PO Take 2 tablets by mouth 2 (two) times daily.   . cephALEXin (KEFLEX) 500 MG capsule Take 500 mg by mouth daily.  . clonazePAM (KLONOPIN) 0.5 MG tablet Take 1 tablet (0.5 mg total) by mouth at bedtime.  [name] Kitchen FIBER PO Take 3 capsules by mouth 2 (two) times daily.   [name] Kitchen lisinopril (PRINIVIL,ZESTRIL) 5 MG tablet Take 1 tablet (5 mg total) by mouth every evening.  . Melatonin 10 MG TABS Take 1 tablet by mouth at bedtime.  . Multiple Vitamin (MULTI-VITAMINS) TABS Take 1 tablet by mouth daily.   [name] Kitchen omeprazole (PRILOSEC) 20 MG capsule TAKE 1 CAPSULE DAILY  (Patient taking differently: Take 20 mg by mouth every morning. )  . Polyethylene Glycol 3350 (MIRALAX PO) Take by mouth. Daily in morning  . Probiotic Product (PROBIOTIC ADVANCED PO) Take 1 tablet by mouth daily.   . simvastatin (ZOCOR) 10 MG tablet Take 1 tablet (10 mg total) by mouth at bedtime.  . TRICOR 145 MG tablet TAKE 1 TABLET EVERY EVENING (Patient taking differently: Take 145 mg by mouth every evening. )  . vitamin C (ASCORBIC ACID) 500 MG tablet Take 500 mg by mouth daily.   No facility-administered encounter medications on file as of 04/16/2019.     Activities of Daily Living In your present state of health, do you have any difficulty performing the following activities: 04/16/2019  Hearing? N  Vision? N  Comment wears glasses  Difficulty concentrating or making decisions? N  Walking or climbing stairs? Y  Comment uses rollator.  Dressing or bathing? Y  Doing errands, shopping? Y  Comment wife drives. pt no longer drives  Preparing Food and eating ? N  Using the Toilet? N  In the past six months, have you accidently leaked urine? Y  Do you have problems with loss of bowel control? Y  Managing your Medications? N  Managing your Finances? Y  Housekeeping or managing your Housekeeping? Y  Some recent data might be hidden    Patient Care Team: Copland, Gay Filler, MD as PCP - General (Family Medicine) Buford Dresser, MD as PCP - Cardiology (Cardiology) Gaynelle Arabian,  MD as Consulting Physician (Orthopedic Surgery) Deirdre Pippins, PA-C as Physician Assistant (Dermatology) Laurence Aly, [place] (Optometry) Carolan Clines, MD (Inactive) as Consulting Physician (Urology)   Assessment:   This is a routine wellness examination for [place]. Physical assessment deferred to PCP.  Exercise Activities and Dietary recommendations Current Exercise Habits: The patient does not participate in regular exercise at present, Exercise limited by: psychological condition(s) Diet  (meal preparation, eat out, water intake, caffeinated beverages, dairy products, fruits and vegetables): well balanced, on average, 3 meals per day   Goals    . Care for bonsai plants.       Fall Risk Fall Risk  04/16/2019 04/12/2018 04/12/2017 04/10/2017 05/18/2016  Falls in the past year? 1 Yes Yes Yes No  Number falls in past yr: 1 1 2  or more 1 -  Injury with Fall? 1 Yes No No -  Comment - - - - -  Risk Factor Category  - - High Fall Risk - -  Risk for fall due to : - - - Impaired balance/gait -  Risk for fall due to: Comment - - - - -  Follow up - Education provided;Falls prevention discussed Falls prevention discussed Education provided;Falls prevention discussed -    Depression Screen PHQ 2/9 Scores 04/16/2019 04/12/2018 04/10/2017 05/18/2016  PHQ - 2 Score 0 0 0 0  Exception Documentation - - - -    Cognitive Function Ad8 score reviewed for issues:  Issues making decisions:no  Less interest in hobbies / activities:no  Repeats questions, stories (family complaining):no  Trouble using ordinary gadgets (microwave, computer, phone):no  Forgets the month or year: no  Mismanaging finances: no  Remembering appts:no  Daily problems with thinking and/or memory:no Ad8 score is=0        Immunization History  Administered Date(s) Administered  . Influenza, High Dose Seasonal PF 09/01/2016, 08/21/2017, 08/23/2018  . Influenza,inj,Quad PF,6+ Mos 08/20/2015  . Influenza-Unspecified 09/11/2014  . Pneumococcal Conjugate-13 05/13/2015  . Pneumococcal Polysaccharide-23 12/12/1998, 01/10/2018  . Td 02/15/2016    Screening Tests Health Maintenance  Topic Date Due  . OPHTHALMOLOGY EXAM  03/24/2019  . INFLUENZA VACCINE  07/13/2019  . HEMOGLOBIN A1C  10/01/2019  . FOOT EXAM  03/31/2020  . TETANUS/TDAP  02/14/2026  . PNA vac Low Risk Adult  Completed   Plan:   See you next year!  Continue to eat heart healthy diet (full of fruits, vegetables, whole grains, lean protein,  water--limit salt, fat, and sugar intake) and increase physical activity as tolerated.  Continue doing brain stimulating activities (puzzles, reading, adult coloring books, staying active) to keep memory sharp.     I have personally reviewed and noted the following in the patient's chart:   . Medical and social history . Use of alcohol, tobacco or illicit drugs  . Current medications and supplements . Functional ability and status . Nutritional status . Physical activity . Advanced directives . List of other physicians . Hospitalizations, surgeries, and ER visits in previous 12 months . Vitals . Screenings to include cognitive, depression, and falls . Referrals and appointments  In addition, I have reviewed and discussed with patient certain preventive protocols, quality metrics, and best practice recommendations. A written personalized care plan for preventive services as well as general preventive health recommendations were provided to patient.     Shela Nevin, [place]  04/16/2019

## 2019-04-16 ENCOUNTER — Ambulatory Visit (INDEPENDENT_AMBULATORY_CARE_PROVIDER_SITE_OTHER): Payer: Medicare Other | Admitting: *Deleted

## 2019-04-16 ENCOUNTER — Encounter: Payer: Self-pay | Admitting: *Deleted

## 2019-04-16 ENCOUNTER — Other Ambulatory Visit: Payer: Self-pay

## 2019-04-16 DIAGNOSIS — Z0000 Encounter for general adult medical examination without abnormal findings: Secondary | ICD-10-CM | POA: Diagnosis not present

## 2019-04-16 DIAGNOSIS — K645 Perianal venous thrombosis: Secondary | ICD-10-CM | POA: Diagnosis not present

## 2019-04-16 NOTE — Patient Instructions (Signed)
See you next year!  Continue to eat heart healthy diet (full of fruits, vegetables, whole grains, lean protein, water--limit salt, fat, and sugar intake) and increase physical activity as tolerated.  Continue doing brain stimulating activities (puzzles, reading, adult coloring books, staying active) to keep memory sharp.    Matthew Nash , Thank you for taking time to come for your Medicare Wellness Visit. I appreciate your ongoing commitment to your health goals. Please review the following plan we discussed and let me know if I can assist you in the future.   These are the goals we discussed: Goals    . Care for bonsai plants.       This is a list of the screening recommended for you and due dates:  Health Maintenance  Topic Date Due  . Eye exam for diabetics  03/24/2019  . Flu Shot  07/13/2019  . Hemoglobin A1C  10/01/2019  . Complete foot exam   03/31/2020  . Tetanus Vaccine  02/14/2026  . Pneumonia vaccines  Completed    Health Maintenance After Age 69 After age 45, you are at a higher risk for certain long-term diseases and infections as well as injuries from falls. Falls are a major cause of broken bones and head injuries in people who are older than age 54. Getting regular preventive care can help to keep you healthy and well. Preventive care includes getting regular testing and making lifestyle changes as recommended by your health care provider. Talk with your health care provider about:  Which screenings and tests you should have. A screening is a test that checks for a disease when you have no symptoms.  A diet and exercise plan that is right for you. What should I know about screenings and tests to prevent falls? Screening and testing are the best ways to find a health problem early. Early diagnosis and treatment give you the best chance of managing medical conditions that are common after age 25. Certain conditions and lifestyle choices may make you more likely to have a  fall. Your health care provider may recommend:  Regular vision checks. Poor vision and conditions such as cataracts can make you more likely to have a fall. If you wear glasses, make sure to get your prescription updated if your vision changes.  Medicine review. Work with your health care provider to regularly review all of the medicines you are taking, including over-the-counter medicines. Ask your health care provider about any side effects that may make you more likely to have a fall. Tell your health care provider if any medicines that you take make you feel dizzy or sleepy.  Osteoporosis screening. Osteoporosis is a condition that causes the bones to get weaker. This can make the bones weak and cause them to break more easily.  Blood pressure screening. Blood pressure changes and medicines to control blood pressure can make you feel dizzy.  Strength and balance checks. Your health care provider may recommend certain tests to check your strength and balance while standing, walking, or changing positions.  Foot health exam. Foot pain and numbness, as well as not wearing proper footwear, can make you more likely to have a fall.  Depression screening. You may be more likely to have a fall if you have a fear of falling, feel emotionally low, or feel unable to do activities that you used to do.  Alcohol use screening. Using too much alcohol can affect your balance and may make you more likely to have a  fall. What actions can I take to lower my risk of falls? General instructions  Talk with your health care provider about your risks for falling. Tell your health care provider if: ? You fall. Be sure to tell your health care provider about all falls, even ones that seem minor. ? You feel dizzy, sleepy, or off-balance.  Take over-the-counter and prescription medicines only as told by your health care provider. These include any supplements.  Eat a healthy diet and maintain a healthy weight. A  healthy diet includes low-fat dairy products, low-fat (lean) meats, and fiber from whole grains, beans, and lots of fruits and vegetables. Home safety  Remove any tripping hazards, such as rugs, cords, and clutter.  Install safety equipment such as grab bars in bathrooms and safety rails on stairs.  Keep rooms and walkways well-lit. Activity   Follow a regular exercise program to stay fit. This will help you maintain your balance. Ask your health care provider what types of exercise are appropriate for you.  If you need a cane or walker, use it as recommended by your health care provider.  Wear supportive shoes that have nonskid soles. Lifestyle  Do not drink alcohol if your health care provider tells you not to drink.  If you drink alcohol, limit how much you have: ? 0-1 drink a day for women. ? 0-2 drinks a day for men.  Be aware of how much alcohol is in your drink. In the [place] one drink equals one typical bottle of beer (12 oz), one-half glass of wine (5 oz), or one shot of hard liquor (1 oz).  Do not use any products that contain nicotine or tobacco, such as cigarettes and e-cigarettes. If you need help quitting, ask your health care provider. Summary  Having a healthy lifestyle and getting preventive care can help to protect your health and wellness after age 22.  Screening and testing are the best way to find a health problem early and help you avoid having a fall. Early diagnosis and treatment give you the best chance for managing medical conditions that are more common for people who are older than age 60.  Falls are a major cause of broken bones and head injuries in people who are older than age 63. Take precautions to prevent a fall at home.  Work with your health care provider to learn what changes you can make to improve your health and wellness and to prevent falls. This information is not intended to replace advice given to you by your health care provider. Make  sure you discuss any questions you have with your health care provider. Document Released: 10/11/2017 Document Revised: 10/11/2017 Document Reviewed: 10/11/2017 Elsevier Interactive Patient Education  2019 [employer].

## 2019-04-16 NOTE — Progress Notes (Signed)
I have reviewed the MWE note by Ms, Vevelyn Royals and agree with her documentation

## 2019-04-19 DIAGNOSIS — E119 Type 2 diabetes mellitus without complications: Secondary | ICD-10-CM | POA: Diagnosis not present

## 2019-04-24 ENCOUNTER — Other Ambulatory Visit: Payer: Self-pay | Admitting: Family Medicine

## 2019-05-01 DIAGNOSIS — N3941 Urge incontinence: Secondary | ICD-10-CM | POA: Diagnosis not present

## 2019-05-14 DIAGNOSIS — L821 Other seborrheic keratosis: Secondary | ICD-10-CM | POA: Diagnosis not present

## 2019-05-14 DIAGNOSIS — L820 Inflamed seborrheic keratosis: Secondary | ICD-10-CM | POA: Diagnosis not present

## 2019-05-14 DIAGNOSIS — L570 Actinic keratosis: Secondary | ICD-10-CM | POA: Diagnosis not present

## 2019-05-14 DIAGNOSIS — Z08 Encounter for follow-up examination after completed treatment for malignant neoplasm: Secondary | ICD-10-CM | POA: Diagnosis not present

## 2019-05-14 DIAGNOSIS — L814 Other melanin hyperpigmentation: Secondary | ICD-10-CM | POA: Diagnosis not present

## 2019-05-14 DIAGNOSIS — Z85828 Personal history of other malignant neoplasm of skin: Secondary | ICD-10-CM | POA: Diagnosis not present

## 2019-05-14 DIAGNOSIS — D1801 Hemangioma of skin and subcutaneous tissue: Secondary | ICD-10-CM | POA: Diagnosis not present

## 2019-05-21 ENCOUNTER — Ambulatory Visit: Payer: Medicare Other | Admitting: Adult Health

## 2019-05-22 ENCOUNTER — Encounter: Payer: Self-pay | Admitting: Adult Health

## 2019-05-22 ENCOUNTER — Ambulatory Visit (INDEPENDENT_AMBULATORY_CARE_PROVIDER_SITE_OTHER): Payer: Medicare Other | Admitting: Adult Health

## 2019-05-22 ENCOUNTER — Other Ambulatory Visit: Payer: Self-pay

## 2019-05-22 DIAGNOSIS — G4733 Obstructive sleep apnea (adult) (pediatric): Secondary | ICD-10-CM

## 2019-05-22 NOTE — Progress Notes (Signed)
Virtual Visit via Video Note  I connected with Matthew Nash on 05/22/19 at 11:30 AM EDT by a video enabled telemedicine application and verified that I am speaking with the correct person using two identifiers.  Location: Patient: Home  Provider: Office    I discussed the limitations of evaluation and management by telemedicine and the availability of in person appointments. The patient expressed understanding and agreed to proceed.  History of Present Illness: Today's televisit is 1 year follow up for OSA   83 yo male followed for OSA , and very vivid dreams .  Patient says he is wearing CPAP every night all night for at least 8-9 hrs . Feels rested with no significant daytime sleepiness. Does not have dreams anymore. He takes klonopin 0.5mg  1/2 tab  At bedtime  Along with melatonin. Feels he benefits from CPAP .  Gets supplies from DME .  CPAP download shows excellent compliance with daily average usage at 7.5 hours.  Patient is on CPAP 12 cm H2O.  AHI 3.3.  Minimum leaks.    Observations/Objective:  /2018 and PSG showed AHI 17/hour with lowest desaturation of 81%, this was worse during REM and supine sleep, 29/hour during REM and 40/hour during supine sleep.  PLM index was 50/hour but PLM related arousals were only 7/hour.  Of note there was no increased muscle tone noted during REM sleep. On a subsequent titration study CPAP of 15 cm was required and he was set up with CPAP at this level with a full facemask.   Assessment and Plan: Obstructive sleep apnea with excellent compliance and control on CPAP  Plan  Patient Instructions  Continue on CPAP At bedtime   Work on healthy weight  Do not drive is sleepy  Follow up with Dr. Elsworth Soho   In 1 year and As needed        Follow Up Instructions: Follow-up with Dr. Halford Chessman in 1 year and as needed   I discussed the assessment and treatment plan with the patient. The patient was provided an opportunity to ask questions and all were  answered. The patient agreed with the plan and demonstrated an understanding of the instructions.   The patient was advised to call back or seek an in-person evaluation if the symptoms worsen or if the condition fails to improve as anticipated.  I provided 22  minutes of non-face-to-face time during this encounter.   Rexene Edison, NP

## 2019-05-22 NOTE — Patient Instructions (Addendum)
Continue on CPAP At bedtime   Work on healthy weight  Do not drive is sleepy  Follow up with Dr. Elsworth Soho   In 1 year and As needed

## 2019-05-23 DIAGNOSIS — R198 Other specified symptoms and signs involving the digestive system and abdomen: Secondary | ICD-10-CM | POA: Diagnosis not present

## 2019-05-28 ENCOUNTER — Encounter: Payer: Self-pay | Admitting: Family Medicine

## 2019-05-30 ENCOUNTER — Ambulatory Visit (INDEPENDENT_AMBULATORY_CARE_PROVIDER_SITE_OTHER): Payer: Medicare Other | Admitting: Podiatry

## 2019-05-30 ENCOUNTER — Encounter: Payer: Self-pay | Admitting: Podiatry

## 2019-05-30 ENCOUNTER — Other Ambulatory Visit: Payer: Self-pay

## 2019-05-30 VITALS — BP 125/68 | HR 73 | Temp 98.0°F

## 2019-05-30 DIAGNOSIS — L603 Nail dystrophy: Secondary | ICD-10-CM

## 2019-05-30 DIAGNOSIS — I872 Venous insufficiency (chronic) (peripheral): Secondary | ICD-10-CM | POA: Diagnosis not present

## 2019-05-30 DIAGNOSIS — E1149 Type 2 diabetes mellitus with other diabetic neurological complication: Secondary | ICD-10-CM | POA: Diagnosis not present

## 2019-05-31 DIAGNOSIS — R109 Unspecified abdominal pain: Secondary | ICD-10-CM | POA: Diagnosis not present

## 2019-05-31 DIAGNOSIS — N3941 Urge incontinence: Secondary | ICD-10-CM | POA: Diagnosis not present

## 2019-05-31 DIAGNOSIS — R8290 Unspecified abnormal findings in urine: Secondary | ICD-10-CM | POA: Diagnosis not present

## 2019-06-04 ENCOUNTER — Encounter: Payer: Self-pay | Admitting: Family Medicine

## 2019-06-05 NOTE — Progress Notes (Signed)
Subjective:   Patient ID: Matthew Nash, male   DOB: 83 y.o.   MRN: [id number]   HPI 83 year old male presents the office today for concerns of his left big toenail becoming split and he noticed last week.  He wented to have the toenail checked.  Also nails been discolored with this ongoing for some time.  No pain in the nails no redness or drainage that he is reporting.  He has no other concerns today.   Review of Systems  All other systems reviewed and are negative.  Past Medical History:  Diagnosis Date  . Arthritis    both knees  . Back pain at L4-L5 level   . Basal cell carcinoma   . BPH (benign prostatic hyperplasia)    hx s/p turp  . Chronic back pain    spondylosis  . Chronic kidney disease    kidney stones 1979  . DDD (degenerative disc disease), lumbar   . Diabetes mellitus    takes Metformin and Victoza daily-under control  . Diarrhea    x 1 today  . Diverticulitis   . Gallstones   . GERD (gastroesophageal reflux disease)    takes Omeprazole daily-under control  . H/O hiatal hernia   . H/O measles   . Hepatitis B    B-with Mono and jaundice 1960  . History of chicken pox   . History of colon polyps   . History of kidney stones   . Hyperlipidemia    takes Simvastatin and Tricor daily-under control  . Hypertension    takes Lisinopril daily-under control  . Joint pain    both knees  . Joint swelling    rt knee  . Kidney stones   . Melanoma ([place])    right hand;basal cell carcinoma  . Neuropathy   . Peripheral vascular disease ([place])    diabetic neuropathy  . Pneumonia    hx of  . Prostate infection    currently taking macrobid  . Sleep apnea with use of continuous positive airway pressure (CPAP)   . Tinnitus   . Urinary incontinence   . UTI (lower urinary tract infection)     Past Surgical History:  Procedure Laterality Date  . BASAL CELL CARCINOMA EXCISION    . BOTOX INJECTION  03/22/2017   In the bladder for nightly incontinence.  . C5 and  T1 bone chip removed   1986  . [place]  . CHOLECYSTECTOMY  1993  . COLONOSCOPY  2011  . cortisone injection    . EYE SURGERY     cataract sx 2017 bilateral  . I&D left knee Left 1958   states 22 times  . KNEE ARTHROSCOPY Right 07/16/2014   Procedure: RIGHT ARTHROSCOPY KNEE WITH Medial and Lateral DEBRIDEMENT and chondroplasty;  Surgeon: Gearlean Alf, MD;  Location: [facility];  Service: Orthopedics;  Laterality: Right;  . LUMBAR LAMINECTOMY/DECOMPRESSION MICRODISCECTOMY Right 02/17/2014   Procedure: RIGHT LUMBAR TWO-THREE LAMINECTOMY;  Surgeon: Charlie Pitter, MD;  Location: [place] [facility];  Service: Neurosurgery;  Laterality: Right;  right   . right shoulder surgery  2012   rotator cuff repair  . SHOULDER ARTHROSCOPY W/ ROTATOR CUFF REPAIR Left NOV 2010  . TONSILLECTOMY  1942  . TOTAL KNEE ARTHROPLASTY Right 05/27/2015   Procedure: RIGHT TOTAL KNEE ARTHROPLASTY;  Surgeon: Gaynelle Arabian, MD;  [facility]: [facility];  Service: Orthopedics;  Laterality: Right;  . TOTAL KNEE ARTHROPLASTY Left 05/30/2016   Procedure: LEFT TOTAL KNEE ARTHROPLASTY;  Surgeon: Gaynelle Arabian, MD;  Location: [facility];  Service: Orthopedics;  Laterality: Left;  . TRANSURETHRAL RESECTION OF PROSTATE  2006  . VASECTOMY  1983  . WISDOM TOOTH EXTRACTION       Current Outpatient Medications:  .  aspirin EC 81 MG tablet, Take 81 mg by mouth daily., Disp: , Rfl:  .  CALCIUM-MAGNESIUM-VITAMIN D PO, Take 2 tablets by mouth 2 (two) times daily. , Disp: , Rfl:  .  cephALEXin (KEFLEX) 500 MG capsule, Take 500 mg by mouth daily., Disp: , Rfl:  .  clonazePAM (KLONOPIN) 0.5 MG tablet, Take 1 tablet (0.5 mg total) by mouth at bedtime., Disp: 90 tablet, Rfl: 0 .  FIBER PO, Take 3 capsules by mouth 2 (two) times daily. , Disp: , Rfl:  .  lisinopril (PRINIVIL,ZESTRIL) 5 MG tablet, Take 1 tablet (5 mg total) by mouth every evening., Disp: 90 tablet, Rfl: 1 .  Melatonin 10 MG TABS, Take 1 tablet by mouth at bedtime., Disp: , Rfl:   .  Multiple Vitamin (MULTI-VITAMINS) TABS, Take 1 tablet by mouth daily. , Disp: , Rfl:  .  omeprazole (PRILOSEC) 20 MG capsule, TAKE 1 CAPSULE DAILY (Patient taking differently: Take 20 mg by mouth every morning. ), Disp: 90 capsule, Rfl: 3 .  Polyethylene Glycol 3350 (MIRALAX PO), Take by mouth. Daily in morning, Disp: , Rfl:  .  Probiotic Product (PROBIOTIC ADVANCED PO), Take 1 tablet by mouth daily. , Disp: , Rfl:  .  simvastatin (ZOCOR) 10 MG tablet, TAKE 1 TABLET AT BEDTIME, Disp: 90 tablet, Rfl: 1 .  TRICOR 145 MG tablet, TAKE 1 TABLET EVERY EVENING (Patient taking differently: Take 145 mg by mouth every evening. ), Disp: 90 tablet, Rfl: 4 .  vitamin C (ASCORBIC ACID) 500 MG tablet, Take 500 mg by mouth daily., Disp: , Rfl:   Allergies  Allergen Reactions  . Albamycin [Novobiocin] Other (See Comments)    Made me look like a strawberry   . Penicillins Rash and Other (See Comments)    Has patient had a PCN reaction causing immediate rash, facial/tongue/throat swelling, SOB or lightheadedness with hypotension:  no Has patient had a PCN reaction causing severe rash involving mucus membranes or skin necrosis: no Has patient had a PCN reaction that required hospitalization no Has patient had a PCN reaction occurring within the last 10 years: no - childhood reaction If all of the above answers are "NO", then may proceed with Cephalosporin use.       Objective:  Physical Exam  General: AAO x3, NAD  Dermatological: The right hallux toenail is split distally and there is yellow-brown discoloration.  There is no pain the nail is noticed any redness or drainage from the infection.  There are no open lesions today.  Vascular: Dorsalis Pedis artery and Posterior Tibial artery pedal pulses are 2/4 bilateral with immedate capillary fill time. There is no pain with calf compression, swelling, warmth, erythema. Venous insuffiencey noted.   Neruologic: Sensation decreased with SWMF.    Musculoskeletal: No gross boney pedal deformities bilateral. No pain, crepitus, or limitation noted with foot and ankle range of motion bilateral. Muscular strength 5/5 in all groups tested bilateral.  Gait: Unassisted, Nonantalgic.       Assessment:   Right hallux onychodystrophy    Plan:  -Treatment options discussed including all alternatives, risks, and complications -Etiology of symptoms were discussed -Nail sharply debrided without any complications or bleeding in order to even out the nail. Monitor for any  signs or symptoms of infection.   Trula Slade DPM

## 2019-06-06 ENCOUNTER — Other Ambulatory Visit: Payer: Self-pay

## 2019-06-06 ENCOUNTER — Ambulatory Visit (INDEPENDENT_AMBULATORY_CARE_PROVIDER_SITE_OTHER): Payer: Medicare Other | Admitting: Family Medicine

## 2019-06-06 ENCOUNTER — Emergency Department (HOSPITAL_BASED_OUTPATIENT_CLINIC_OR_DEPARTMENT_OTHER): Payer: Medicare Other

## 2019-06-06 ENCOUNTER — Encounter (HOSPITAL_BASED_OUTPATIENT_CLINIC_OR_DEPARTMENT_OTHER): Payer: Self-pay | Admitting: Emergency Medicine

## 2019-06-06 ENCOUNTER — Encounter: Payer: Self-pay | Admitting: Family Medicine

## 2019-06-06 ENCOUNTER — Inpatient Hospital Stay (HOSPITAL_BASED_OUTPATIENT_CLINIC_OR_DEPARTMENT_OTHER)
Admission: EM | Admit: 2019-06-06 | Discharge: 2019-06-11 | DRG: 189 | Disposition: A | Discharge status: Home Health | Payer: Medicare Other | Source: Ambulatory Visit | Attending: Internal Medicine | Admitting: Internal Medicine

## 2019-06-06 VITALS — BP 124/60 | HR 87 | Temp 98.6°F | Resp 20 | Ht 71.0 in | Wt 233.0 lb

## 2019-06-06 DIAGNOSIS — E785 Hyperlipidemia, unspecified: Secondary | ICD-10-CM | POA: Diagnosis present

## 2019-06-06 DIAGNOSIS — N400 Enlarged prostate without lower urinary tract symptoms: Secondary | ICD-10-CM | POA: Diagnosis not present

## 2019-06-06 DIAGNOSIS — N182 Chronic kidney disease, stage 2 (mild): Secondary | ICD-10-CM | POA: Diagnosis not present

## 2019-06-06 DIAGNOSIS — R2689 Other abnormalities of gait and mobility: Secondary | ICD-10-CM | POA: Diagnosis not present

## 2019-06-06 DIAGNOSIS — E1122 Type 2 diabetes mellitus with diabetic chronic kidney disease: Secondary | ICD-10-CM | POA: Diagnosis present

## 2019-06-06 DIAGNOSIS — Z96653 Presence of artificial knee joint, bilateral: Secondary | ICD-10-CM | POA: Diagnosis not present

## 2019-06-06 DIAGNOSIS — J9811 Atelectasis: Secondary | ICD-10-CM | POA: Diagnosis present

## 2019-06-06 DIAGNOSIS — I10 Essential (primary) hypertension: Secondary | ICD-10-CM | POA: Diagnosis present

## 2019-06-06 DIAGNOSIS — N179 Acute kidney failure, unspecified: Secondary | ICD-10-CM | POA: Diagnosis not present

## 2019-06-06 DIAGNOSIS — J069 Acute upper respiratory infection, unspecified: Secondary | ICD-10-CM | POA: Diagnosis not present

## 2019-06-06 DIAGNOSIS — K5909 Other constipation: Secondary | ICD-10-CM | POA: Diagnosis not present

## 2019-06-06 DIAGNOSIS — J9622 Acute and chronic respiratory failure with hypercapnia: Secondary | ICD-10-CM | POA: Diagnosis not present

## 2019-06-06 DIAGNOSIS — I248 Other forms of acute ischemic heart disease: Secondary | ICD-10-CM | POA: Diagnosis present

## 2019-06-06 DIAGNOSIS — G4733 Obstructive sleep apnea (adult) (pediatric): Secondary | ICD-10-CM | POA: Diagnosis not present

## 2019-06-06 DIAGNOSIS — E1151 Type 2 diabetes mellitus with diabetic peripheral angiopathy without gangrene: Secondary | ICD-10-CM | POA: Diagnosis present

## 2019-06-06 DIAGNOSIS — Z825 Family history of asthma and other chronic lower respiratory diseases: Secondary | ICD-10-CM

## 2019-06-06 DIAGNOSIS — I129 Hypertensive chronic kidney disease with stage 1 through stage 4 chronic kidney disease, or unspecified chronic kidney disease: Secondary | ICD-10-CM | POA: Diagnosis present

## 2019-06-06 DIAGNOSIS — K219 Gastro-esophageal reflux disease without esophagitis: Secondary | ICD-10-CM | POA: Diagnosis present

## 2019-06-06 DIAGNOSIS — Z8249 Family history of ischemic heart disease and other diseases of the circulatory system: Secondary | ICD-10-CM | POA: Diagnosis not present

## 2019-06-06 DIAGNOSIS — K5900 Constipation, unspecified: Secondary | ICD-10-CM

## 2019-06-06 DIAGNOSIS — Z87891 Personal history of nicotine dependence: Secondary | ICD-10-CM | POA: Diagnosis not present

## 2019-06-06 DIAGNOSIS — R0602 Shortness of breath: Secondary | ICD-10-CM | POA: Diagnosis not present

## 2019-06-06 DIAGNOSIS — T464X5A Adverse effect of angiotensin-converting-enzyme inhibitors, initial encounter: Secondary | ICD-10-CM | POA: Diagnosis present

## 2019-06-06 DIAGNOSIS — J9601 Acute respiratory failure with hypoxia: Secondary | ICD-10-CM | POA: Diagnosis not present

## 2019-06-06 DIAGNOSIS — Z7982 Long term (current) use of aspirin: Secondary | ICD-10-CM

## 2019-06-06 DIAGNOSIS — Z20828 Contact with and (suspected) exposure to other viral communicable diseases: Secondary | ICD-10-CM | POA: Diagnosis not present

## 2019-06-06 DIAGNOSIS — E860 Dehydration: Secondary | ICD-10-CM | POA: Diagnosis not present

## 2019-06-06 DIAGNOSIS — R778 Other specified abnormalities of plasma proteins: Secondary | ICD-10-CM | POA: Diagnosis present

## 2019-06-06 DIAGNOSIS — R0902 Hypoxemia: Secondary | ICD-10-CM

## 2019-06-06 DIAGNOSIS — R6883 Chills (without fever): Secondary | ICD-10-CM | POA: Diagnosis not present

## 2019-06-06 DIAGNOSIS — E119 Type 2 diabetes mellitus without complications: Secondary | ICD-10-CM

## 2019-06-06 DIAGNOSIS — Z7984 Long term (current) use of oral hypoglycemic drugs: Secondary | ICD-10-CM

## 2019-06-06 LAB — CBC WITH DIFFERENTIAL/PLATELET
Abs Immature Granulocytes: 0.02 10*3/uL (ref 0.00–0.07)
Basophils Absolute: 0 10*3/uL (ref 0.0–0.1)
Basophils Relative: 0 %
Eosinophils Absolute: 0.1 10*3/uL (ref 0.0–0.5)
Eosinophils Relative: 1 %
HCT: 39.3 % (ref 39.0–52.0)
Hemoglobin: 11.9 g/dL — ABNORMAL LOW (ref 13.0–17.0)
Immature Granulocytes: 0 %
Lymphocytes Relative: 8 %
Lymphs Abs: 0.8 10*3/uL (ref 0.7–4.0)
MCH: 28 pg (ref 26.0–34.0)
MCHC: 30.3 g/dL (ref 30.0–36.0)
MCV: 92.5 fL (ref 80.0–100.0)
Monocytes Absolute: 0.9 10*3/uL (ref 0.1–1.0)
Monocytes Relative: 10 %
Neutro Abs: 7.5 10*3/uL (ref 1.7–7.7)
Neutrophils Relative %: 81 %
Platelets: 282 10*3/uL (ref 150–400)
RBC: 4.25 MIL/uL (ref 4.22–5.81)
RDW: 13.4 % (ref 11.5–15.5)
WBC: 9.3 10*3/uL (ref 4.0–10.5)
nRBC: 0 % (ref 0.0–0.2)

## 2019-06-06 LAB — COMPREHENSIVE METABOLIC PANEL
ALT: 20 U/L (ref 0–44)
AST: 14 U/L — ABNORMAL LOW (ref 15–41)
Albumin: 3.2 g/dL — ABNORMAL LOW (ref 3.5–5.0)
Alkaline Phosphatase: 41 U/L (ref 38–126)
Anion gap: 9 (ref 5–15)
BUN: 39 mg/dL — ABNORMAL HIGH (ref 8–23)
CO2: 27 mmol/L (ref 22–32)
Calcium: 9.2 mg/dL (ref 8.9–10.3)
Chloride: 103 mmol/L (ref 98–111)
Creatinine, Ser: 2.12 mg/dL — ABNORMAL HIGH (ref 0.61–1.24)
GFR calc Af Amer: 33 mL/min — ABNORMAL LOW (ref >60)
GFR calc non Af Amer: 28 mL/min — ABNORMAL LOW (ref >60)
Glucose, Bld: 140 mg/dL — ABNORMAL HIGH (ref 70–99)
Potassium: 4.6 mmol/L (ref 3.5–5.1)
Sodium: 139 mmol/L (ref 135–145)
Total Bilirubin: 0.4 mg/dL (ref 0.3–1.2)
Total Protein: 6.6 g/dL (ref 6.5–8.1)

## 2019-06-06 LAB — URINALYSIS, ROUTINE W REFLEX MICROSCOPIC
Bilirubin Urine: NEGATIVE
Glucose, UA: NEGATIVE mg/dL
Ketones, ur: NEGATIVE mg/dL
Nitrite: NEGATIVE
Protein, ur: NEGATIVE mg/dL
Specific Gravity, Urine: 1.025 (ref 1.005–1.030)
pH: 6 (ref 5.0–8.0)

## 2019-06-06 LAB — BRAIN NATRIURETIC PEPTIDE: B Natriuretic Peptide: 73.4 pg/mL (ref 0.0–100.0)

## 2019-06-06 LAB — URINALYSIS, MICROSCOPIC (REFLEX)

## 2019-06-06 LAB — TROPONIN I (HIGH SENSITIVITY)
Troponin I (High Sensitivity): 24 ng/L — ABNORMAL HIGH (ref <18)
Troponin I (High Sensitivity): 19 ng/L — ABNORMAL HIGH (ref <18)

## 2019-06-06 LAB — SARS CORONAVIRUS 2 AG (30 MIN TAT): SARS Coronavirus 2 Ag: NEGATIVE

## 2019-06-06 LAB — MAGNESIUM: Magnesium: 2 mg/dL (ref 1.7–2.4)

## 2019-06-06 LAB — D-DIMER, QUANTITATIVE: D-Dimer, Quant: 3.68 ug/mL-FEU — ABNORMAL HIGH (ref 0.00–0.50)

## 2019-06-06 LAB — LIPASE, BLOOD: Lipase: 43 U/L (ref 11–51)

## 2019-06-06 MED ORDER — SODIUM CHLORIDE 0.9 % IV BOLUS
1000.0000 mL | Freq: Once | INTRAVENOUS | Status: AC
  Administered 2019-06-06: 19:00:00 1000 mL via INTRAVENOUS

## 2019-06-06 MED ORDER — ENOXAPARIN SODIUM 80 MG/0.8ML ~~LOC~~ SOLN
0.7500 mg/kg | Freq: Once | SUBCUTANEOUS | Status: AC
  Administered 2019-06-06: 80 mg via SUBCUTANEOUS
  Filled 2019-06-06: qty 0.8

## 2019-06-06 MED ORDER — SODIUM CHLORIDE 0.45 % IV SOLN: INTRAVENOUS | Status: DC

## 2019-06-06 MED ORDER — ENOXAPARIN SODIUM 120 MG/0.8ML ~~LOC~~ SOLN: 1.0000 mg/kg | Freq: Once | SUBCUTANEOUS | Status: DC

## 2019-06-06 NOTE — ED Notes (Signed)
Does self cath's tid at home

## 2019-06-06 NOTE — ED Provider Notes (Addendum)
[facility] EMERGENCY DEPARTMENT Provider Note   CSN: [id number] Arrival date & time: 06/06/19  1601    History   Chief Complaint Chief Complaint  Patient presents with   Shortness of Breath    HPI Matthew Nash is a 83 y.o. male.     The history is provided by the patient.  Shortness of Breath Severity:  Moderate Onset quality:  Gradual Timing:  Constant Progression:  Worsening Chronicity:  New Context: activity and URI   Relieved by:  Rest Worsened by:  Activity Associated symptoms: cough and sputum production   Associated symptoms: no abdominal pain, no chest pain, no ear pain, no fever, no rash, no sore throat and no vomiting     Past Medical History:  Diagnosis Date   Arthritis    both knees   Back pain at L4-L5 level    Basal cell carcinoma    BPH (benign prostatic hyperplasia)    hx s/p turp   Chronic back pain    spondylosis   Chronic kidney disease    kidney stones 1979   DDD (degenerative disc disease), lumbar    Diabetes mellitus    takes Metformin and Victoza daily-under control   Diarrhea    x 1 today   Diverticulitis    Gallstones    GERD (gastroesophageal reflux disease)    takes Omeprazole daily-under control   H/O hiatal hernia    H/O measles    Hepatitis B    B-with Mono and jaundice 1960   History of chicken pox    History of colon polyps    History of kidney stones    Hyperlipidemia    takes Simvastatin and Tricor daily-under control   Hypertension    takes Lisinopril daily-under control   Joint pain    both knees   Joint swelling    rt knee   Kidney stones    Melanoma ([place])    right hand;basal cell carcinoma   Neuropathy    Peripheral vascular disease ([facility])    diabetic neuropathy   Pneumonia    hx of   Prostate infection    currently taking macrobid   Sleep apnea with use of continuous positive airway pressure (CPAP)    Tinnitus    Urinary incontinence    UTI (lower  urinary tract infection)     Patient Active Problem List   Diagnosis Date Noted   Chronic kidney disease 11/25/2018   Ischemic chest pain ([place]) 11/25/2018   Chest pain 11/25/2018   Pelvic floor dysfunction 04/04/2018   Change in bowel habits 04/04/2018   OSA on CPAP 01/12/2018   REM behavioral disorder 01/12/2018   Diarrhea 09/15/2017   Incontinence of feces 09/15/2017   Peripheral neuropathy 10/20/2016   Bladder outlet obstruction 05/02/2016   OA (osteoarthritis) of knee 05/27/2015   Diabetes mellitus type II, controlled ([place]) 11/26/2014   Gastroesophageal reflux disease without esophagitis 11/26/2014   Arthritis of both knees 11/26/2014   Absence of bladder continence 11/26/2014   Hyperlipidemia LDL goal <100 11/26/2014   Acute medial meniscal tear 07/16/2014   Lumbosacral spondylosis without myelopathy 02/17/2014   Spondylosis, lumbosacral 02/17/2014    Past Surgical History:  Procedure Laterality Date   BASAL CELL CARCINOMA EXCISION     BOTOX INJECTION  03/22/2017   In the bladder for nightly incontinence.   C5 and T1 bone chip removed   [place]  2011   cortisone injection     EYE SURGERY     cataract sx 2017 bilateral   I&D left knee Left 1958   states 22 times   KNEE ARTHROSCOPY Right 07/16/2014   Procedure: RIGHT ARTHROSCOPY KNEE WITH Medial and Lateral DEBRIDEMENT and chondroplasty;  Surgeon: Gearlean Alf, MD;  [facility];  Service: Orthopedics;  Laterality: Right;   LUMBAR LAMINECTOMY/DECOMPRESSION MICRODISCECTOMY Right 02/17/2014   Procedure: RIGHT LUMBAR TWO-THREE LAMINECTOMY;  Surgeon: Charlie Pitter, MD;  Location: [facility];  Service: Neurosurgery;  Laterality: Right;  right    right shoulder surgery  2012   rotator cuff repair   SHOULDER ARTHROSCOPY W/ ROTATOR CUFF REPAIR Left NOV 2010   TONSILLECTOMY  1942   TOTAL KNEE ARTHROPLASTY Right 05/27/2015     Procedure: RIGHT TOTAL KNEE ARTHROPLASTY;  Surgeon: Gaynelle Arabian, MD;  [facility];  Service: Orthopedics;  Laterality: Right;   TOTAL KNEE ARTHROPLASTY Left 05/30/2016   Procedure: LEFT TOTAL KNEE ARTHROPLASTY;  Surgeon: Gaynelle Arabian, MD;  [facility];  Service: Orthopedics;  Laterality: Left;   TRANSURETHRAL RESECTION OF PROSTATE  2006   VASECTOMY  1983   WISDOM TOOTH EXTRACTION          Home Medications    Prior to Admission medications   Medication Sig Start Date End Date Taking? Authorizing Provider  aspirin EC 81 MG tablet Take 81 mg by mouth daily.    [provider]  CALCIUM-MAGNESIUM-VITAMIN D PO Take 2 tablets by mouth 2 (two) times daily.     [provider]  cephALEXin (KEFLEX) 500 MG capsule Take 500 mg by mouth daily.    [provider]  clonazePAM (KLONOPIN) 0.5 MG tablet Take 1 tablet (0.5 mg total) by mouth at bedtime. 03/19/19   Copland, Gay Filler, MD  FIBER PO Take 3 capsules by mouth 2 (two) times daily.     [provider]  lisinopril (PRINIVIL,ZESTRIL) 5 MG tablet Take 1 tablet (5 mg total) by mouth every evening. 01/28/19   Copland, Gay Filler, MD  Melatonin 10 MG TABS Take 1 tablet by mouth at bedtime.    [provider]  Multiple Vitamin (MULTI-VITAMINS) TABS Take 1 tablet by mouth daily.     [provider]  omeprazole (PRILOSEC) 20 MG capsule TAKE 1 CAPSULE DAILY Patient taking differently: Take 20 mg by mouth every morning.  06/15/17   Copland, Gay Filler, MD  Polyethylene Glycol 3350 (MIRALAX PO) Take by mouth. Daily in morning    [provider]  Probiotic Product (PROBIOTIC ADVANCED PO) Take 1 tablet by mouth daily.     [provider]  simvastatin (ZOCOR) 10 MG tablet TAKE 1 TABLET AT BEDTIME 04/25/19   Copland, Gay Filler, MD  TRICOR 145 MG tablet TAKE 1 TABLET EVERY EVENING Patient taking differently: Take 145 mg by mouth every evening.  11/01/18   Copland, Gay Filler, MD   vitamin C (ASCORBIC ACID) 500 MG tablet Take 500 mg by mouth daily.    [provider]    Family History Family History  Problem Relation Age of Onset   Heart disease Mother 55       Deceased   Bladder Cancer Mother    Breast cancer Mother    Prostate cancer Father        Deceased-in 82s   Healthy Brother    Skin cancer Brother        "basal cell cancer in the bloodstream"  Asthma Sister        Deceased   Healthy Son        #1   Healthy Daughter        #2   Obesity Son        #2    Social History Social History   Tobacco Use   Smoking status: Former Smoker    Types: Pipe    Quit date: 07/14/1978    Years since quitting: 40.9   Smokeless tobacco: Never Used   Tobacco comment: quit smoking 1979  Substance Use Topics   Alcohol use: Yes    Comment: OCCASIONAL   Drug use: No     Allergies   Sulfamethoxazole-trimethoprim, Albamycin [novobiocin], and Penicillins   Review of Systems Review of Systems  Constitutional: Negative for chills and fever.  HENT: Negative for ear pain and sore throat.   Eyes: Negative for pain and visual disturbance.  Respiratory: Positive for cough, sputum production and shortness of breath.   Cardiovascular: Negative for chest pain and palpitations.  Gastrointestinal: Negative for abdominal pain and vomiting.  Genitourinary: Negative for dysuria and hematuria.  Musculoskeletal: Negative for arthralgias and back pain.  Skin: Negative for color change and rash.  Neurological: Negative for seizures and syncope.  All other systems reviewed and are negative.    Physical Exam Updated Vital Signs  ED Triage Vitals  Enc Vitals Group     BP 06/06/19 1611 125/61     Pulse Rate 06/06/19 1611 82     Resp 06/06/19 1611 (!) 22     Temp 06/06/19 1641 99.4 F (37.4 C)     Temp Source 06/06/19 1641 Rectal     SpO2 06/06/19 1610 98 %     Weight --      Height --      Head Circumference --      Peak Flow --       Pain Score 06/06/19 1636 0     Pain Loc --      Pain Edu? --      Excl. in [place]? --     Physical Exam Vitals signs and nursing note reviewed.  Constitutional:      General: He is in acute distress.     Appearance: He is well-developed.  HENT:     Head: Normocephalic and atraumatic.  Eyes:     Extraocular Movements: Extraocular movements intact.     Conjunctiva/sclera: Conjunctivae normal.     Pupils: Pupils are equal, round, and reactive to light.  Neck:     Musculoskeletal: Normal range of motion and neck supple.  Cardiovascular:     Rate and Rhythm: Normal rate and regular rhythm.     Heart sounds: No murmur.  Pulmonary:     Effort: Tachypnea present. No respiratory distress.     Breath sounds: Decreased breath sounds present. No rhonchi.     Comments: On 2L of 02 Abdominal:     Palpations: Abdomen is soft.     Tenderness: There is no abdominal tenderness.  Musculoskeletal:     Right lower leg: Edema present.     Left lower leg: Edema present.  Skin:    General: Skin is warm and dry.     Capillary Refill: Capillary refill takes less than 2 seconds.  Neurological:     General: No focal deficit present.     Mental Status: He is alert.      ED Treatments / Results  Labs (all labs ordered are  listed, but only abnormal results are displayed) Labs Reviewed  CBC WITH DIFFERENTIAL/PLATELET - Abnormal; Notable for the following components:      Result Value   Hemoglobin 11.9 (*)    All other components within normal limits  COMPREHENSIVE METABOLIC PANEL - Abnormal; Notable for the following components:   Glucose, Bld 140 (*)    BUN 39 (*)    Creatinine, Ser 2.12 (*)    Albumin 3.2 (*)    AST 14 (*)    GFR calc non Af Amer 28 (*)    GFR calc Af Amer 33 (*)    All other components within normal limits  URINALYSIS, ROUTINE W REFLEX MICROSCOPIC - Abnormal; Notable for the following components:   APPearance HAZY (*)    Hgb urine dipstick LARGE (*)    Leukocytes,Ua  TRACE (*)    All other components within normal limits  URINALYSIS, MICROSCOPIC (REFLEX) - Abnormal; Notable for the following components:   Bacteria, UA FEW (*)    All other components within normal limits  D-DIMER, QUANTITATIVE (NOT AT [facility]) - Abnormal; Notable for the following components:   D-Dimer, Quant 3.68 (*)    All other components within normal limits  SARS CORONAVIRUS 2 (HOSP ORDER, PERFORMED IN [facility] VIA [facility]BBOTT ID)  URINE CULTURE  CULTURE, BLOOD (ROUTINE X 2)  CULTURE, BLOOD (ROUTINE X 2)  LIPASE, BLOOD  BRAIN NATRIURETIC PEPTIDE  TROPONIN I (HIGH SENSITIVITY)  TROPONIN I (HIGH SENSITIVITY)    EKG EKG Interpretation  Date/Time:  Thursday June 06 2019 16:13:02 EDT Ventricular Rate:  80 PR Interval:    QRS Duration: 107 QT Interval:  381 QTC Calculation: 440 R Axis:   14 Text Interpretation:  Sinus rhythm Multiple ventricular premature complexes Low voltage, precordial leads Abnormal R-wave progression, early transition Confirmed by Lennice Sites [id number]) on 06/06/2019 4:20:34 PM   Radiology Dg Chest Portable 1 View  Result Date: 06/06/2019 CLINICAL DATA:  Shortness of breath, fall, LEFT flank pain, hypertension EXAM: PORTABLE CHEST 1 VIEW COMPARISON:  Portable exam at 1647 hrs compared to 11/25/2018 FINDINGS: Normal heart size, mediastinal contours, and pulmonary vascularity. Bibasilar atelectasis. Upper lungs clear. No pleural effusion or pneumothorax. No acute osseous findings. IMPRESSION: Bibasilar atelectasis. Electronically Signed   By: Lavonia Dana M.D.   On: 06/06/2019 17:52    Procedures .Critical Care Performed by: Lennice Sites, DO Authorized by: Lennice Sites, DO   Critical care provider statement:    Critical care time (minutes):  35   Critical care time was exclusive of:  Separately billable procedures and treating other patients and teaching time   Critical care was necessary to treat or prevent imminent or life-threatening  deterioration of the following conditions:  Respiratory failure   Critical care was time spent personally by me on the following activities:  Blood draw for specimens, development of treatment plan with patient or surrogate, ordering and performing treatments and interventions, ordering and review of laboratory studies, ordering and review of radiographic studies, pulse oximetry, re-evaluation of patient's condition, review of old charts, discussions with primary provider, examination of patient and obtaining history from patient or surrogate   I assumed direction of critical care for this patient from another provider in my specialty: no     (including critical care time)  Medications Ordered in ED Medications - No data to display   Initial Impression / Assessment and Plan / ED Course  I have reviewed the triage vital signs and the nursing notes.  Pertinent  labs & imaging results that were available during my care of the patient were reviewed by me and considered in my medical decision making (see chart for details).     Matthew Nash is an 83 year old male with history of high cholesterol, hypertension, kidney stones who presents the ED with shortness of breath.  Patient went to primary care provider and was found to be hypoxic and sent here for evaluation.  Patient hypoxic upon arrival which stabilized on 2 L of oxygen.  Patient tachypneic.  Decreased breath sounds throughout.  Patient with temperature of 99.4 rectally.  He states he has had progressive shortness of breath over several days to weeks.  Has worsening symptoms with exertion.  No history of PE or DVT.  No recent surgery.  Denies any chest pain.  Has had a cough and some sputum production.  Patient denies any fevers at home.  Infectious work-up was initiated.  EKG showed sinus rhythm.  Patient did have some leg edema on exam.  Does not have a history of heart failure.  Had an echocardiogram about 7 months ago that was  unremarkable.  X-ray shows bibasilar atelectasis.  No obvious pneumonia.  Coronavirus test is negative.  Patient with no significant leukocytosis.  Patient with an AKI with a creatinine of 2.12.  BUN elevated to 39.  However hemoglobin normal.  Overall no obvious cause for shortness of breath.  Could be coronavirus as our test here is not very accurate.  Could be mild heart failure.  Could be PE.  Unable to do a CT scan due to AKI.  However will add a BNP, troponin, d-dimer.  Given hypoxia patient will need admission.  Suspect he would benefit from an echocardiogram, possibly a VQ scan.  Could be viral/coronavirus.  Could be early pneumonia.    Discussed on the phone with hospitalist.  D-dimer did come back elevated as well as trop. BNP unremarkable.  However, with an AKI will hold off on PE scan.  May need some hydration and then a PE scan later.  After discussion with hospitalist we will give normal saline bolus, give 0.75 mg/kg of Lovenox for possible PE.  Will need repeat coronavirus test at [facility] as well.  Little less likely volume overload at this time given BNP.  Patient does not have any chest pain and low concern for ACS.  Will trend troponin.  This chart was dictated using voice recognition software.  Despite best efforts to proofread,  errors can occur which can change the documentation meaning.    Final Clinical Impressions(s) / ED Diagnoses   Final diagnoses:  Acute respiratory failure with hypoxia [facility])    ED Discharge Orders    None       Lennice Sites, DO 06/06/19 1830    Lennice Sites, DO 06/06/19 1831    Lennice Sites, DO 06/06/19 [place], [place], DO 06/06/19 1855

## 2019-06-06 NOTE — Progress Notes (Signed)
[facility] [address], [place], [place] [address] [phone]  Date:  06/06/2019   Name:  Matthew Nash   DOB:  03/26/1936   MRN:  [id number]  PCP:  Darreld Mclean, MD    Chief Complaint: O2 check (fell this morning, o2 80 percent, shortness of breath)   History of Present Illness:  Matthew Nash is a 83 y.o. very pleasant male patient who presents with the following: History of diet controlled DM, BPH, OSA, AAA under observation, chronic diarrhea, hyperlipidemia In person visit today for concern of change in his normal state of health   Most recent labs in April-  Lab Results  Component Value Date   HGBA1C 7.2 (H) 04/01/2019   He went in to see Dr. Venia Minks with urology just recently- on 6/19- for treatment of his neurogenic bladder He was given an antibiotic and took it starting this past Friday - we are not sure of the name of abx and I can't find in care everywhere  On Saturday he developed severe diarrhea and fecal incontinence - thought due to abx This continued on Sunday - he fell to the floor on Sunday while trying to get dressed.  EMTs came to help get him get up.  They noticed he was SOB so they contacted me about this and we made an appt  Since Sunday he used some imodium and yogurt He has felt dizzy, lightheaded, foggy, not his normal self He fell this am out of the bed and EMS had to come again- they said his O2 was 80%   Sat was 94% in April  Sat 99% in December He does not use home oxygen Does use capp  His wife notes that he seems to have aged 90 years overnight, she cannot keep up with his needs, and he is having chills but no fever   Patient Active Problem List   Diagnosis Date Noted  . Chronic kidney disease 11/25/2018  . Ischemic chest pain ([place]) 11/25/2018  . Chest pain 11/25/2018  . Pelvic floor dysfunction 04/04/2018  . Change in bowel habits 04/04/2018  . OSA on CPAP  01/12/2018  . REM behavioral disorder 01/12/2018  . Diarrhea 09/15/2017  . Incontinence of feces 09/15/2017  . Peripheral neuropathy 10/20/2016  . Bladder outlet obstruction 05/02/2016  . OA (osteoarthritis) of knee 05/27/2015  . Diabetes mellitus type II, controlled ([place]) 11/26/2014  . Gastroesophageal reflux disease without esophagitis 11/26/2014  . Arthritis of both knees 11/26/2014  . Absence of bladder continence 11/26/2014  . Hyperlipidemia LDL goal <100 11/26/2014  . Acute medial meniscal tear 07/16/2014  . Lumbosacral spondylosis without myelopathy 02/17/2014  . Spondylosis, lumbosacral 02/17/2014    Past Medical History:  Diagnosis Date  . Arthritis    both knees  . Back pain at L4-L5 level   . Basal cell carcinoma   . BPH (benign prostatic hyperplasia)    hx s/p turp  . Chronic back pain    spondylosis  . Chronic kidney disease    kidney stones 1979  . DDD (degenerative disc disease), lumbar   . Diabetes mellitus    takes Metformin and Victoza daily-under control  . Diarrhea    x 1 today  . Diverticulitis   . Gallstones   . GERD (gastroesophageal reflux disease)    takes Omeprazole daily-under control  . H/O hiatal hernia   . H/O measles   . Hepatitis  B    B-with Mono and jaundice 1960  . History of chicken pox   . History of colon polyps   . History of kidney stones   . Hyperlipidemia    takes Simvastatin and Tricor daily-under control  . Hypertension    takes Lisinopril daily-under control  . Joint pain    both knees  . Joint swelling    rt knee  . Kidney stones   . Melanoma ([place])    right hand;basal cell carcinoma  . Neuropathy   . Peripheral vascular disease ([place])    diabetic neuropathy  . Pneumonia    hx of  . Prostate infection    currently taking macrobid  . Sleep apnea with use of continuous positive airway pressure (CPAP)   . Tinnitus   . Urinary incontinence   . UTI (lower urinary tract infection)     Past Surgical History:   Procedure Laterality Date  . BASAL CELL CARCINOMA EXCISION    . BOTOX INJECTION  03/22/2017   In the bladder for nightly incontinence.  . C5 and T1 bone chip removed   1986  . [place]  . CHOLECYSTECTOMY  1993  . COLONOSCOPY  2011  . cortisone injection    . EYE SURGERY     cataract sx 2017 bilateral  . I&D left knee Left 1958   states 22 times  . KNEE ARTHROSCOPY Right 07/16/2014   Procedure: RIGHT ARTHROSCOPY KNEE WITH Medial and Lateral DEBRIDEMENT and chondroplasty;  Surgeon: Gearlean Alf, MD;  Location: [facility];  Service: Orthopedics;  Laterality: Right;  . LUMBAR LAMINECTOMY/DECOMPRESSION MICRODISCECTOMY Right 02/17/2014   Procedure: RIGHT LUMBAR TWO-THREE LAMINECTOMY;  Surgeon: Charlie Pitter, MD;  Location: [facility];  Service: Neurosurgery;  Laterality: Right;  right   . right shoulder surgery  2012   rotator cuff repair  . SHOULDER ARTHROSCOPY W/ ROTATOR CUFF REPAIR Left NOV 2010  . TONSILLECTOMY  1942  . TOTAL KNEE ARTHROPLASTY Right 05/27/2015   Procedure: RIGHT TOTAL KNEE ARTHROPLASTY;  Surgeon: Gaynelle Arabian, MD;  Location: [facility];  Service: Orthopedics;  Laterality: Right;  . TOTAL KNEE ARTHROPLASTY Left 05/30/2016   Procedure: LEFT TOTAL KNEE ARTHROPLASTY;  Surgeon: Gaynelle Arabian, MD;  Location: [facility];  Service: Orthopedics;  Laterality: Left;  . TRANSURETHRAL RESECTION OF PROSTATE  2006  . VASECTOMY  1983  . WISDOM TOOTH EXTRACTION      Social History   Tobacco Use  . Smoking status: Former Smoker    Types: Pipe    Quit date: 07/14/1978    Years since quitting: 40.9  . Smokeless tobacco: Never Used  . Tobacco comment: quit smoking 1979  Substance Use Topics  . Alcohol use: Yes    Comment: OCCASIONAL  . Drug use: No    Family History  Problem Relation Age of Onset  . Heart disease Mother 84       Deceased  . Bladder Cancer Mother   . Breast cancer Mother   . Prostate cancer Father        Deceased-in 1s  . Healthy Brother   . Skin  cancer Brother        "basal cell cancer in the bloodstream"  . Asthma Sister        Deceased  . Healthy Son        #1  . Healthy Daughter        #2  . Obesity Son        #  2    Allergies  Allergen Reactions  . Sulfamethoxazole-Trimethoprim Diarrhea    Severe diarrhea  . Albamycin [Novobiocin] Other (See Comments)    Made me look like a strawberry   . Penicillins Rash and Other (See Comments)    Has patient had a PCN reaction causing immediate rash, facial/tongue/throat swelling, SOB or lightheadedness with hypotension:  no Has patient had a PCN reaction causing severe rash involving mucus membranes or skin necrosis: no Has patient had a PCN reaction that required hospitalization no Has patient had a PCN reaction occurring within the last 10 years: no - childhood reaction If all of the above answers are "NO", then may proceed with Cephalosporin use.     Medication list has been reviewed and updated.  Current Outpatient Medications on File Prior to Visit  Medication Sig Dispense Refill  . aspirin EC 81 MG tablet Take 81 mg by mouth daily.    [name] Kitchen CALCIUM-MAGNESIUM-VITAMIN D PO Take 2 tablets by mouth 2 (two) times daily.     . cephALEXin (KEFLEX) 500 MG capsule Take 500 mg by mouth daily.    . clonazePAM (KLONOPIN) 0.5 MG tablet Take 1 tablet (0.5 mg total) by mouth at bedtime. 90 tablet 0  . FIBER PO Take 3 capsules by mouth 2 (two) times daily.     [name] Kitchen lisinopril (PRINIVIL,ZESTRIL) 5 MG tablet Take 1 tablet (5 mg total) by mouth every evening. 90 tablet 1  . Melatonin 10 MG TABS Take 1 tablet by mouth at bedtime.    . Multiple Vitamin (MULTI-VITAMINS) TABS Take 1 tablet by mouth daily.     [name] Kitchen omeprazole (PRILOSEC) 20 MG capsule TAKE 1 CAPSULE DAILY (Patient taking differently: Take 20 mg by mouth every morning. ) 90 capsule 3  . Polyethylene Glycol 3350 (MIRALAX PO) Take by mouth. Daily in morning    . Probiotic Product (PROBIOTIC ADVANCED PO) Take 1 tablet by mouth daily.     .  simvastatin (ZOCOR) 10 MG tablet TAKE 1 TABLET AT BEDTIME 90 tablet 1  . TRICOR 145 MG tablet TAKE 1 TABLET EVERY EVENING (Patient taking differently: Take 145 mg by mouth every evening. ) 90 tablet 4  . vitamin C (ASCORBIC ACID) 500 MG tablet Take 500 mg by mouth daily.     No current facility-administered medications on file prior to visit.     Review of Systems:  They have noted chills but no fever No significant cough   Physical Examination: Vitals:   06/06/19 1543  BP: 124/60  Pulse: 87  Resp: 20  Temp: 98.6 F (37 C)  SpO2: (!) 85%   Vitals:   06/06/19 1543  Weight: 233 lb (105.7 kg)  Height: 5\' 11"  (1.803 m)   Body mass index is 32.5 kg/m. Ideal Body Weight: Weight in (lb) to have BMI = 25: 178.9  GEN: WDWN, NAD, Non-toxic, A & O x 3, looks well seated in WC HEENT: Atraumatic, Normocephalic. Neck supple. No masses, No LAD. Ears and Nose: No external deformity. CV: RRR, No M/G/R. No JVD. No thrill. No extra heart sounds. PULM: crackles and ronchi left base.  No retractions. No resp. distress. No accessory muscle use. EXTR: No c/c/e NEURO Normal gait.  PSYCH: Normally interactive. Conversant. Not depressed or anxious appearing.  Calm demeanor.    Assessment and Plan:   ICD-10-CM   1. Chills  R68.83   2. Hypoxemia  R09.02      Follow-up: No follow-ups on file.  No orders of the  defined types were placed in this encounter.  No orders of the defined types were placed in this encounter.    Here today with acute worsening of normal state of health- he has hypoxia and I suspect he has pneumonia.  He cannot go home in current state Referral to [facility] the [facility] for further care    Signed Lamar Blinks, MD

## 2019-06-06 NOTE — ED Notes (Signed)
XR at bedside for [facility]

## 2019-06-06 NOTE — ED Notes (Signed)
Pt. Wife called by [facility] Dace and informed that the Pt. Is doing well with unknown diagnosis by EDP at this time.  Pt. Update to be given to wife when more information is known and if pt. Will be admitted.  Pt. Wife told to call [facility] if she has any questions or wants to know anything.

## 2019-06-06 NOTE — ED Triage Notes (Signed)
Pt sent from [place] upstairs for sob/fall. Sat 77% on r/a in room. Pain Left flank . Denies chest pain. Does not use home O2

## 2019-06-07 ENCOUNTER — Encounter (HOSPITAL_COMMUNITY): Payer: Medicare Other

## 2019-06-07 ENCOUNTER — Observation Stay (HOSPITAL_BASED_OUTPATIENT_CLINIC_OR_DEPARTMENT_OTHER): Payer: Medicare Other

## 2019-06-07 ENCOUNTER — Observation Stay (HOSPITAL_COMMUNITY): Payer: Medicare Other

## 2019-06-07 DIAGNOSIS — N179 Acute kidney failure, unspecified: Secondary | ICD-10-CM | POA: Diagnosis not present

## 2019-06-07 DIAGNOSIS — R2689 Other abnormalities of gait and mobility: Secondary | ICD-10-CM | POA: Diagnosis present

## 2019-06-07 DIAGNOSIS — R079 Chest pain, unspecified: Secondary | ICD-10-CM | POA: Diagnosis not present

## 2019-06-07 DIAGNOSIS — J9622 Acute and chronic respiratory failure with hypercapnia: Secondary | ICD-10-CM | POA: Diagnosis present

## 2019-06-07 DIAGNOSIS — Z825 Family history of asthma and other chronic lower respiratory diseases: Secondary | ICD-10-CM | POA: Diagnosis not present

## 2019-06-07 DIAGNOSIS — E785 Hyperlipidemia, unspecified: Secondary | ICD-10-CM

## 2019-06-07 DIAGNOSIS — K219 Gastro-esophageal reflux disease without esophagitis: Secondary | ICD-10-CM | POA: Diagnosis not present

## 2019-06-07 DIAGNOSIS — N182 Chronic kidney disease, stage 2 (mild): Secondary | ICD-10-CM | POA: Diagnosis not present

## 2019-06-07 DIAGNOSIS — J9601 Acute respiratory failure with hypoxia: Secondary | ICD-10-CM | POA: Diagnosis not present

## 2019-06-07 DIAGNOSIS — E1122 Type 2 diabetes mellitus with diabetic chronic kidney disease: Secondary | ICD-10-CM | POA: Diagnosis not present

## 2019-06-07 DIAGNOSIS — G4733 Obstructive sleep apnea (adult) (pediatric): Secondary | ICD-10-CM

## 2019-06-07 DIAGNOSIS — R7989 Other specified abnormal findings of blood chemistry: Secondary | ICD-10-CM

## 2019-06-07 DIAGNOSIS — T464X5A Adverse effect of angiotensin-converting-enzyme inhibitors, initial encounter: Secondary | ICD-10-CM | POA: Diagnosis present

## 2019-06-07 DIAGNOSIS — Z7982 Long term (current) use of aspirin: Secondary | ICD-10-CM | POA: Diagnosis not present

## 2019-06-07 DIAGNOSIS — I10 Essential (primary) hypertension: Secondary | ICD-10-CM | POA: Diagnosis not present

## 2019-06-07 DIAGNOSIS — Z8249 Family history of ischemic heart disease and other diseases of the circulatory system: Secondary | ICD-10-CM | POA: Diagnosis not present

## 2019-06-07 DIAGNOSIS — J9811 Atelectasis: Secondary | ICD-10-CM | POA: Diagnosis not present

## 2019-06-07 DIAGNOSIS — Z87891 Personal history of nicotine dependence: Secondary | ICD-10-CM | POA: Diagnosis not present

## 2019-06-07 DIAGNOSIS — E1151 Type 2 diabetes mellitus with diabetic peripheral angiopathy without gangrene: Secondary | ICD-10-CM | POA: Diagnosis not present

## 2019-06-07 DIAGNOSIS — Z9989 Dependence on other enabling machines and devices: Secondary | ICD-10-CM | POA: Diagnosis not present

## 2019-06-07 DIAGNOSIS — E860 Dehydration: Secondary | ICD-10-CM | POA: Diagnosis present

## 2019-06-07 DIAGNOSIS — R778 Other specified abnormalities of plasma proteins: Secondary | ICD-10-CM | POA: Diagnosis present

## 2019-06-07 DIAGNOSIS — I129 Hypertensive chronic kidney disease with stage 1 through stage 4 chronic kidney disease, or unspecified chronic kidney disease: Secondary | ICD-10-CM | POA: Diagnosis not present

## 2019-06-07 DIAGNOSIS — Z20828 Contact with and (suspected) exposure to other viral communicable diseases: Secondary | ICD-10-CM | POA: Diagnosis not present

## 2019-06-07 DIAGNOSIS — Z96653 Presence of artificial knee joint, bilateral: Secondary | ICD-10-CM | POA: Diagnosis present

## 2019-06-07 DIAGNOSIS — K5909 Other constipation: Secondary | ICD-10-CM | POA: Diagnosis present

## 2019-06-07 DIAGNOSIS — N400 Enlarged prostate without lower urinary tract symptoms: Secondary | ICD-10-CM | POA: Diagnosis present

## 2019-06-07 DIAGNOSIS — K5900 Constipation, unspecified: Secondary | ICD-10-CM | POA: Diagnosis not present

## 2019-06-07 DIAGNOSIS — I248 Other forms of acute ischemic heart disease: Secondary | ICD-10-CM | POA: Diagnosis not present

## 2019-06-07 DIAGNOSIS — J069 Acute upper respiratory infection, unspecified: Secondary | ICD-10-CM | POA: Diagnosis present

## 2019-06-07 DIAGNOSIS — R0602 Shortness of breath: Secondary | ICD-10-CM | POA: Diagnosis not present

## 2019-06-07 LAB — RESPIRATORY PANEL BY PCR

## 2019-06-07 LAB — BLOOD GAS, ARTERIAL
Acid-Base Excess: 4.8 mmol/L — ABNORMAL HIGH (ref 0.0–2.0)
Bicarbonate: 31 mmol/L — ABNORMAL HIGH (ref 20.0–28.0)
Drawn by: 28338
O2 Content: 6 L/min
O2 Saturation: 93.8 %
Patient temperature: 98.6
pCO2 arterial: 66.9 mmHg (ref 32.0–48.0)
pH, Arterial: 7.288 — ABNORMAL LOW (ref 7.350–7.450)
pO2, Arterial: 74.5 mmHg — ABNORMAL LOW (ref 83.0–108.0)

## 2019-06-07 LAB — PROCALCITONIN: Procalcitonin: <0.1 ng/mL

## 2019-06-07 LAB — TROPONIN I (HIGH SENSITIVITY)
Troponin I (High Sensitivity): 21 ng/L — ABNORMAL HIGH (ref <18)
Troponin I (High Sensitivity): 20 ng/L — ABNORMAL HIGH (ref <18)
Troponin I (High Sensitivity): 22 ng/L — ABNORMAL HIGH (ref <18)

## 2019-06-07 LAB — CREATININE, SERUM
Creatinine, Ser: 1.79 mg/dL — ABNORMAL HIGH (ref 0.61–1.24)
GFR calc Af Amer: 40 mL/min — ABNORMAL LOW (ref >60)
GFR calc non Af Amer: 35 mL/min — ABNORMAL LOW (ref >60)

## 2019-06-07 LAB — MRSA PCR SCREENING: MRSA by PCR: NEGATIVE

## 2019-06-07 LAB — SARS CORONAVIRUS 2 BY RT PCR (HOSPITAL ORDER, PERFORMED IN ~~LOC~~ HOSPITAL LAB): SARS Coronavirus 2: NEGATIVE

## 2019-06-07 LAB — GLUCOSE, CAPILLARY
Glucose-Capillary: 120 mg/dL — ABNORMAL HIGH (ref 70–99)
Glucose-Capillary: 103 mg/dL — ABNORMAL HIGH (ref 70–99)
Glucose-Capillary: 96 mg/dL (ref 70–99)
Glucose-Capillary: 166 mg/dL — ABNORMAL HIGH (ref 70–99)

## 2019-06-07 LAB — URINE CULTURE: Culture: NO GROWTH

## 2019-06-07 LAB — ECHOCARDIOGRAM COMPLETE
Height: 70 in
Weight: 3619.07 oz

## 2019-06-07 LAB — LACTIC ACID, PLASMA: Lactic Acid, Venous: 0.6 mmol/L (ref 0.5–1.9)

## 2019-06-07 MED ORDER — INSULIN ASPART 100 UNIT/ML ~~LOC~~ SOLN
0.0000 [IU] | Freq: Three times a day (TID) | SUBCUTANEOUS | Status: DC
  Administered 2019-06-08 (×2): 2 [IU] via SUBCUTANEOUS
  Administered 2019-06-08: 1 [IU] via SUBCUTANEOUS
  Administered 2019-06-09 – 2019-06-10 (×4): 2 [IU] via SUBCUTANEOUS

## 2019-06-07 MED ORDER — POLYETHYLENE GLYCOL 3350 17 G PO PACK
17.0000 g | PACK | Freq: Every day | ORAL | Status: DC | PRN
  Administered 2019-06-09: 17 g via ORAL
  Filled 2019-06-07: qty 1

## 2019-06-07 MED ORDER — HYDRALAZINE HCL 20 MG/ML IJ SOLN: 5.0000 mg | INTRAMUSCULAR | Status: DC | PRN

## 2019-06-07 MED ORDER — PANTOPRAZOLE SODIUM 40 MG PO TBEC
40.0000 mg | DELAYED_RELEASE_TABLET | Freq: Every day | ORAL | Status: DC
  Administered 2019-06-07 – 2019-06-11 (×5): 40 mg via ORAL
  Filled 2019-06-07 (×5): qty 1

## 2019-06-07 MED ORDER — FENOFIBRATE 160 MG PO TABS
160.0000 mg | ORAL_TABLET | Freq: Every day | ORAL | Status: DC
  Administered 2019-06-07 – 2019-06-11 (×5): 160 mg via ORAL
  Filled 2019-06-07 (×5): qty 1

## 2019-06-07 MED ORDER — ENOXAPARIN SODIUM 40 MG/0.4ML ~~LOC~~ SOLN
40.0000 mg | SUBCUTANEOUS | Status: DC
  Administered 2019-06-07 – 2019-06-11 (×4): 40 mg via SUBCUTANEOUS
  Filled 2019-06-07 (×4): qty 0.4

## 2019-06-07 MED ORDER — LEVALBUTEROL HCL 0.63 MG/3ML IN NEBU: 0.6300 mg | INHALATION_SOLUTION | Freq: Three times a day (TID) | RESPIRATORY_TRACT | Status: DC

## 2019-06-07 MED ORDER — METHYLPREDNISOLONE SODIUM SUCC 125 MG IJ SOLR
60.0000 mg | Freq: Two times a day (BID) | INTRAMUSCULAR | Status: DC
  Administered 2019-06-07 – 2019-06-09 (×4): 60 mg via INTRAVENOUS
  Filled 2019-06-07 (×4): qty 2

## 2019-06-07 MED ORDER — ACETAMINOPHEN 325 MG PO TABS: 650.0000 mg | ORAL_TABLET | Freq: Four times a day (QID) | ORAL | Status: DC | PRN

## 2019-06-07 MED ORDER — SODIUM CHLORIDE 0.9 % IV SOLN: INTRAVENOUS | Status: DC

## 2019-06-07 MED ORDER — SIMVASTATIN 20 MG PO TABS
10.0000 mg | ORAL_TABLET | Freq: Every day | ORAL | Status: DC
  Administered 2019-06-08 – 2019-06-10 (×3): 10 mg via ORAL
  Filled 2019-06-07 (×4): qty 1

## 2019-06-07 MED ORDER — CALCIUM CARBONATE-VITAMIN D 500-200 MG-UNIT PO TABS
1.0000 | ORAL_TABLET | Freq: Two times a day (BID) | ORAL | Status: DC
  Administered 2019-06-07 – 2019-06-11 (×7): 1 via ORAL
  Filled 2019-06-07 (×8): qty 1

## 2019-06-07 MED ORDER — CALCIUM POLYCARBOPHIL 625 MG PO TABS
625.0000 mg | ORAL_TABLET | Freq: Two times a day (BID) | ORAL | Status: DC
  Administered 2019-06-07 – 2019-06-11 (×5): 625 mg via ORAL
  Filled 2019-06-07 (×11): qty 1

## 2019-06-07 MED ORDER — CLONAZEPAM 0.5 MG PO TABS: 0.5000 mg | ORAL_TABLET | Freq: Every day | ORAL | Status: DC

## 2019-06-07 MED ORDER — MELATONIN 3 MG PO TABS
9.0000 mg | ORAL_TABLET | Freq: Every day | ORAL | Status: DC
  Administered 2019-06-09 – 2019-06-10 (×2): 9 mg via ORAL
  Filled 2019-06-07 (×6): qty 3

## 2019-06-07 MED ORDER — SACCHAROMYCES BOULARDII 250 MG PO CAPS
250.0000 mg | ORAL_CAPSULE | Freq: Every day | ORAL | Status: DC
  Administered 2019-06-07 – 2019-06-11 (×5): 250 mg via ORAL
  Filled 2019-06-07 (×5): qty 1

## 2019-06-07 MED ORDER — FUROSEMIDE 10 MG/ML IJ SOLN
20.0000 mg | Freq: Once | INTRAMUSCULAR | Status: AC
  Administered 2019-06-07: 20 mg via INTRAVENOUS
  Filled 2019-06-07: qty 2

## 2019-06-07 MED ORDER — VITAMIN C 500 MG PO TABS
500.0000 mg | ORAL_TABLET | Freq: Every day | ORAL | Status: DC
  Administered 2019-06-07 – 2019-06-11 (×4): 500 mg via ORAL
  Filled 2019-06-07 (×5): qty 1

## 2019-06-07 MED ORDER — DM-GUAIFENESIN ER 30-600 MG PO TB12: 1.0000 | ORAL_TABLET | Freq: Two times a day (BID) | ORAL | Status: DC | PRN

## 2019-06-07 MED ORDER — LEVALBUTEROL TARTRATE 45 MCG/ACT IN AERO: 2.0000 | INHALATION_SPRAY | Freq: Three times a day (TID) | RESPIRATORY_TRACT | Status: DC

## 2019-06-07 MED ORDER — LEVALBUTEROL HCL 0.63 MG/3ML IN NEBU
0.6300 mg | INHALATION_SOLUTION | Freq: Three times a day (TID) | RESPIRATORY_TRACT | Status: DC
  Administered 2019-06-07 – 2019-06-10 (×8): 0.63 mg via RESPIRATORY_TRACT
  Filled 2019-06-07 (×8): qty 3

## 2019-06-07 MED ORDER — CLONAZEPAM 0.5 MG PO TABS
0.2500 mg | ORAL_TABLET | Freq: Every day | ORAL | Status: DC
  Administered 2019-06-10: 0.25 mg via ORAL
  Filled 2019-06-07 (×2): qty 1

## 2019-06-07 MED ORDER — INSULIN ASPART 100 UNIT/ML ~~LOC~~ SOLN
0.0000 [IU] | Freq: Every day | SUBCUTANEOUS | Status: DC
  Administered 2019-06-10: 2 [IU] via SUBCUTANEOUS

## 2019-06-07 MED ORDER — ADULT MULTIVITAMIN W/MINERALS CH
1.0000 | ORAL_TABLET | Freq: Every day | ORAL | Status: DC
  Administered 2019-06-07 – 2019-06-11 (×5): 1 via ORAL
  Filled 2019-06-07 (×5): qty 1

## 2019-06-07 MED ORDER — IPRATROPIUM BROMIDE 0.02 % IN SOLN
0.5000 mg | Freq: Three times a day (TID) | RESPIRATORY_TRACT | Status: DC
  Administered 2019-06-07 – 2019-06-10 (×8): 0.5 mg via RESPIRATORY_TRACT
  Filled 2019-06-07 (×8): qty 2.5

## 2019-06-07 MED ORDER — DEXTROSE 5 % IV SOLN
INTRAVENOUS | Status: DC
  Administered 2019-06-07: 23:00:00 via INTRAVENOUS

## 2019-06-07 MED ORDER — TECHNETIUM TO 99M ALBUMIN AGGREGATED
1.5000 | Freq: Once | INTRAVENOUS | Status: AC | PRN
  Administered 2019-06-07: 1.5 via INTRAVENOUS

## 2019-06-07 MED ORDER — LEVALBUTEROL TARTRATE 45 MCG/ACT IN AERO
2.0000 | INHALATION_SPRAY | Freq: Four times a day (QID) | RESPIRATORY_TRACT | Status: DC | PRN
  Filled 2019-06-07: qty 15

## 2019-06-07 MED ORDER — IPRATROPIUM BROMIDE HFA 17 MCG/ACT IN AERS
2.0000 | INHALATION_SPRAY | RESPIRATORY_TRACT | Status: DC
  Administered 2019-06-07: 2 via RESPIRATORY_TRACT
  Filled 2019-06-07: qty 12.9

## 2019-06-07 MED ORDER — AMLODIPINE BESYLATE 5 MG PO TABS
5.0000 mg | ORAL_TABLET | Freq: Every day | ORAL | Status: DC
  Administered 2019-06-07: 5 mg via ORAL
  Filled 2019-06-07 (×2): qty 1

## 2019-06-07 MED ORDER — ASPIRIN EC 81 MG PO TBEC
81.0000 mg | DELAYED_RELEASE_TABLET | Freq: Every day | ORAL | Status: DC
  Administered 2019-06-07 – 2019-06-11 (×5): 81 mg via ORAL
  Filled 2019-06-07 (×5): qty 1

## 2019-06-07 MED ORDER — ONDANSETRON HCL 4 MG/2ML IJ SOLN: 4.0000 mg | Freq: Three times a day (TID) | INTRAMUSCULAR | Status: DC | PRN

## 2019-06-07 NOTE — ED Notes (Signed)
Report given to [facility] 

## 2019-06-07 NOTE — H&P (Addendum)
History and Physical    Matthew Nash QIO:[id number] DOB: Jul 11, 1936 DOA: 06/06/2019  Referring MD/NP/PA:   PCP: Darreld Mclean, MD   Patient coming from:  The patient is coming from home.  At baseline, pt is independent for most of ADL.        Chief Complaint: SOB  HPI: Matthew Nash is a 83 y.o. male with medical history significant of hypertension, hyperlipidemia, diabetes mellitus, GERD, anxiety, BPH, HBV, PVD, OSA on CPAP, who presents with shortness of breath.  Patient states that he has been having shortness of breath in the past several days, which has been progressively getting worse.  He has cough with little mucus production.  He has intermittent runny nose, but no sore throat.  Denies fever or chills.  No chest pain.  Patient was found to have oxygen desaturation to 77% on room air, and temporarily required BiPAP in ED, currently on 6 L nasal cannula oxygen with oxygen saturation 95%.  Patient denies nausea, vomiting, diarrhea, abdominal pain, symptoms of UTI or unilateral weakness.  Patient was found to have elevated d-dimer 3.68, 1 dose of Lovenox 75 mg was given in ED.  ED Course: pt was found to have COVID-19 negative ([facility]), pending covid19 cepheid test, WBC 9.3, troponin XIX, 21, 20, BNP 73.4, negative urinalysis, AKI with creatinine 2.12, BUN 39, chest x-ray showed bilateral atelectasis, temperature normal, blood pressure 184/62, heart rate 94, tachypnea. Patient is placed on stepdown bed for observation.  Review of Systems:   General: no fevers, chills, no body weight gain, has fatigue HEENT: no blurry vision, hearing changes or sore throat Respiratory: has dyspnea, coughing, no wheezing CV: no chest pain, no palpitations GI: no nausea, vomiting, abdominal pain, diarrhea, constipation GU: no dysuria, burning on urination, increased urinary frequency, hematuria  Ext: has leg edema Neuro: no unilateral weakness, numbness, or tingling, no vision change or hearing  loss Skin: no rash, no skin tear. MSK: No muscle spasm, no deformity, no limitation of range of movement in spin Heme: No easy bruising.  Travel history: No recent long distant travel.  Allergy:  Allergies  Allergen Reactions  . Sulfamethoxazole-Trimethoprim Diarrhea    Severe diarrhea  . Albamycin [Novobiocin] Other (See Comments)    Made me look like a strawberry   . Penicillins Rash and Other (See Comments)    Has patient had a PCN reaction causing immediate rash, facial/tongue/throat swelling, SOB or lightheadedness with hypotension:  no Has patient had a PCN reaction causing severe rash involving mucus membranes or skin necrosis: no Has patient had a PCN reaction that required hospitalization no Has patient had a PCN reaction occurring within the last 10 years: no - childhood reaction If all of the above answers are "NO", then may proceed with Cephalosporin use.     Past Medical History:  Diagnosis Date  . Arthritis    both knees  . Back pain at L4-L5 level   . Basal cell carcinoma   . BPH (benign prostatic hyperplasia)    hx s/p turp  . Chronic back pain    spondylosis  . Chronic kidney disease    kidney stones 1979  . DDD (degenerative disc disease), lumbar   . Diabetes mellitus    takes Metformin and Victoza daily-under control  . Diarrhea    x 1 today  . Diverticulitis   . Gallstones   . GERD (gastroesophageal reflux disease)    takes Omeprazole daily-under control  . H/O hiatal hernia   .  H/O measles   . Hepatitis B    B-with Mono and jaundice 1960  . History of chicken pox   . History of colon polyps   . History of kidney stones   . Hyperlipidemia    takes Simvastatin and Tricor daily-under control  . Hypertension    takes Lisinopril daily-under control  . Joint pain    both knees  . Joint swelling    rt knee  . Kidney stones   . Melanoma ([place])    right hand;basal cell carcinoma  . Neuropathy   . Peripheral vascular disease ([place])    diabetic  neuropathy  . Pneumonia    hx of  . Prostate infection    currently taking macrobid  . Sleep apnea with use of continuous positive airway pressure (CPAP)   . Tinnitus   . Urinary incontinence   . UTI (lower urinary tract infection)     Past Surgical History:  Procedure Laterality Date  . BASAL CELL CARCINOMA EXCISION    . BOTOX INJECTION  03/22/2017   In the bladder for nightly incontinence.  . C5 and T1 bone chip removed   1986  . [place]  . CHOLECYSTECTOMY  1993  . COLONOSCOPY  2011  . cortisone injection    . EYE SURGERY     cataract sx 2017 bilateral  . I&D left knee Left 1958   states 22 times  . KNEE ARTHROSCOPY Right 07/16/2014   Procedure: RIGHT ARTHROSCOPY KNEE WITH Medial and Lateral DEBRIDEMENT and chondroplasty;  Surgeon: Gearlean Alf, MD;  Location: [facility];  Service: Orthopedics;  Laterality: Right;  . LUMBAR LAMINECTOMY/DECOMPRESSION MICRODISCECTOMY Right 02/17/2014   Procedure: RIGHT LUMBAR TWO-THREE LAMINECTOMY;  Surgeon: Charlie Pitter, MD;  Location: [facility];  Service: Neurosurgery;  Laterality: Right;  right   . right shoulder surgery  2012   rotator cuff repair  . SHOULDER ARTHROSCOPY W/ ROTATOR CUFF REPAIR Left NOV 2010  . TONSILLECTOMY  1942  . TOTAL KNEE ARTHROPLASTY Right 05/27/2015   Procedure: RIGHT TOTAL KNEE ARTHROPLASTY;  Surgeon: Gaynelle Arabian, MD;  Location: [facility];  Service: Orthopedics;  Laterality: Right;  . TOTAL KNEE ARTHROPLASTY Left 05/30/2016   Procedure: LEFT TOTAL KNEE ARTHROPLASTY;  Surgeon: Gaynelle Arabian, MD;  Location: [facility];  Service: Orthopedics;  Laterality: Left;  . TRANSURETHRAL RESECTION OF PROSTATE  2006  . VASECTOMY  1983  . WISDOM TOOTH EXTRACTION      Social History:  reports that he quit smoking about 40 years ago. His smoking use included pipe. He has never used smokeless tobacco. He reports current alcohol use. He reports that he does not use drugs.  Family History:  Family History  Problem  Relation Age of Onset  . Heart disease Mother 43       Deceased  . Bladder Cancer Mother   . Breast cancer Mother   . Prostate cancer Father        Deceased-in 81s  . Healthy Brother   . Skin cancer Brother        "basal cell cancer in the bloodstream"  . Asthma Sister        Deceased  . Healthy Son        #1  . Healthy Daughter        #2  . Obesity Son        #2     Prior to Admission medications   Medication Sig Start Date End Date Taking?  Authorizing Provider  aspirin EC 81 MG tablet Take 81 mg by mouth daily.    [provider]  CALCIUM-MAGNESIUM-VITAMIN D PO Take 2 tablets by mouth 2 (two) times daily.     [provider]  cephALEXin (KEFLEX) 500 MG capsule Take 500 mg by mouth daily.    [provider]  clonazePAM (KLONOPIN) 0.5 MG tablet Take 1 tablet (0.5 mg total) by mouth at bedtime. 03/19/19   Copland, Gay Filler, MD  FIBER PO Take 3 capsules by mouth 2 (two) times daily.     [provider]  lisinopril (PRINIVIL,ZESTRIL) 5 MG tablet Take 1 tablet (5 mg total) by mouth every evening. 01/28/19   Copland, Gay Filler, MD  Melatonin 10 MG TABS Take 1 tablet by mouth at bedtime.    [provider]  Multiple Vitamin (MULTI-VITAMINS) TABS Take 1 tablet by mouth daily.     [provider]  omeprazole (PRILOSEC) 20 MG capsule TAKE 1 CAPSULE DAILY Patient taking differently: Take 20 mg by mouth every morning.  06/15/17   Copland, Gay Filler, MD  Polyethylene Glycol 3350 (MIRALAX PO) Take by mouth. Daily in morning    [provider]  Probiotic Product (PROBIOTIC ADVANCED PO) Take 1 tablet by mouth daily.     [provider]  simvastatin (ZOCOR) 10 MG tablet TAKE 1 TABLET AT BEDTIME 04/25/19   Copland, Gay Filler, MD  TRICOR 145 MG tablet TAKE 1 TABLET EVERY EVENING Patient taking differently: Take 145 mg by mouth every evening.  11/01/18   Copland, Gay Filler, MD  vitamin C (ASCORBIC ACID) 500 MG tablet Take 500 mg by  mouth daily.    [provider]    Physical Exam: Vitals:   06/07/19 0306 06/07/19 0331 06/07/19 0458 06/07/19 0600  BP:   (!) 184/62 (!) 163/54  Pulse: 79 76 94 92  Resp: (!) 31 (!) 33 (!) 22 (!) 33  Temp:   98 F (36.7 C)   TempSrc:   Oral   SpO2: 100% 99% 98% 95%  Weight:   102.6 kg   Height:   5\' 10"  (1.778 m)    General: Not in acute distress HEENT:       Eyes: PERRL, EOMI, no scleral icterus.       ENT: No discharge from the ears and nose, no pharynx injection, no tonsillar enlargement.        Neck: No JVD, no bruit, no mass felt. Heme: No neck lymph node enlargement. Cardiac: S1/S2, RRR, No murmurs, No gallops or rubs. Respiratory: No rales, wheezing, rhonchi or rubs. GI: Soft, nondistended, nontender, no rebound pain, no organomegaly, BS present. GU: No hematuria Ext: has 1+ leg pitting edema bilaterally. 2+DP/PT pulse bilaterally. Musculoskeletal: No joint deformities, No joint redness or warmth, no limitation of ROM in spin. Skin: No rashes.  Neuro: Alert, oriented X3, cranial nerves II-XII grossly intact, moves all extremities normally. Psych: Patient is not psychotic, no suicidal or hemocidal ideation.  Labs on Admission: I have personally reviewed following labs and imaging studies  CBC: Recent Labs  Lab 06/06/19 1620  WBC 9.3  NEUTROABS 7.5  HGB 11.9*  HCT 39.3  MCV 92.5  PLT 626   Basic Metabolic Panel: Recent Labs  Lab 06/06/19 1620  NA 139  K 4.6  CL 103  CO2 27  GLUCOSE 140*  BUN 39*  CREATININE 2.12*  CALCIUM 9.2  MG 2.0   GFR: Estimated Creatinine Clearance: 32.8 mL/min (A) (by C-G formula  based on SCr of 2.12 mg/dL (H)). Liver Function Tests: Recent Labs  Lab 06/06/19 1620  AST 14*  ALT 20  ALKPHOS 41  BILITOT 0.4  PROT 6.6  ALBUMIN 3.2*   Recent Labs  Lab 06/06/19 1620  LIPASE 43   No results for input(s): AMMONIA in the last 168 hours. Coagulation Profile: No results for input(s): INR, PROTIME in the last  168 hours. Cardiac Enzymes: No results for input(s): CKTOTAL, CKMB, CKMBINDEX, TROPONINI in the last 168 hours. BNP (last 3 results) No results for input(s): PROBNP in the last 8760 hours. HbA1C: No results for input(s): HGBA1C in the last 72 hours. CBG: No results for input(s): GLUCAP in the last 168 hours. Lipid Profile: No results for input(s): CHOL, HDL, LDLCALC, TRIG, CHOLHDL, LDLDIRECT in the last 72 hours. Thyroid Function Tests: No results for input(s): TSH, T4TOTAL, FREET4, T3FREE, THYROIDAB in the last 72 hours. Anemia Panel: No results for input(s): VITAMINB12, FOLATE, FERRITIN, TIBC, IRON, RETICCTPCT in the last 72 hours. Urine analysis:    Component Value Date/Time   COLORURINE YELLOW 06/06/2019 1640   APPEARANCEUR HAZY (A) 06/06/2019 1640   LABSPEC 1.025 06/06/2019 1640   PHURINE 6.0 06/06/2019 1640   GLUCOSEU NEGATIVE 06/06/2019 1640   GLUCOSEU NEGATIVE 03/20/2015 1012   HGBUR LARGE (A) 06/06/2019 1640   BILIRUBINUR NEGATIVE 06/06/2019 1640   BILIRUBINUR negative 07/27/2016 1614   KETONESUR NEGATIVE 06/06/2019 1640   PROTEINUR NEGATIVE 06/06/2019 1640   UROBILINOGEN negative 07/27/2016 1614   UROBILINOGEN 1.0 08/23/2015 1230   NITRITE NEGATIVE 06/06/2019 1640   LEUKOCYTESUR TRACE (A) 06/06/2019 1640   Sepsis Labs: @LABRCNTIP (procalcitonin:4,lacticidven:4) ) Recent Results (from the past 240 hour(s))  SARS Coronavirus 2 (Hosp order,Performed in [facility] lab via [facility] ID)     Status: None   Collection Time: 06/06/19  4:20 PM   Specimen: Dry Nasal Swab ([facility] ID Now)  Result Value Ref Range Status   SARS Coronavirus 2 ([facility] ID Now) NEGATIVE NEGATIVE Final    Comment: (NOTE) Interpretive Result Comment(s): COVID 19 Positive SARS CoV 2 target nucleic acids are DETECTED. The SARS CoV 2 RNA is generally detectable in upper and lower respiratory specimens during the acute phase of infection.  Positive results are indicative of active infection with SARS  CoV 2.  Clinical correlation with patient history and other diagnostic information is necessary to determine patient infection status.  Positive results do not rule out bacterial infection or coinfection with other viruses. The expected result is Negative. COVID 19 Negative SARS CoV 2 target nucleic acids are NOT DETECTED. The SARS CoV 2 RNA is generally detectable in upper and lower respiratory specimens during the acute phase of infection.  Negative results do not preclude SARS CoV 2 infection, do not rule out coinfections with other pathogens, and should not be used as the sole basis for treatment or other patient management decisions.  Negative results must be combined with clinical  observations, patient history, and epidemiological information. The expected result is Negative. Invalid Presence or absence of SARS CoV 2 nucleic acids cannot be determined. Repeat testing was performed on the submitted specimen and repeated Invalid results were obtained.  If clinically indicated, additional testing on a new specimen with an alternate test methodology [phone]) is advised.  The SARS CoV 2 RNA is generally detectable in upper and lower respiratory specimens during the acute phase of infection. The expected result is Negative. Fact Sheet for Patients:  GolfingFamily.no Fact Sheet for Healthcare Providers: https://www.hernandez-brewer.com/ This test is  not yet approved or cleared by the [place] and has been authorized for detection and/or diagnosis of SARS CoV 2 by FDA under an Emergency Use Authorization (EUA).  This EUA will remain in effect (meaning this test can be used) for the duration of the COVID19 d eclaration under Section 564(b)(1) of the Act, 21 U.S.C. section [phone] 3(b)(1), unless the authorization is terminated or revoked sooner. Performed at [facility], [address] [place], [place] [address]   MRSA PCR  Screening     Status: None   Collection Time: 06/07/19  5:03 AM  Result Value Ref Range Status   MRSA by PCR NEGATIVE NEGATIVE Final    Comment:        The GeneXpert MRSA Assay (FDA approved for NASAL specimens only), is one component of a comprehensive MRSA colonization surveillance program. It is not intended to diagnose MRSA infection nor to guide or monitor treatment for MRSA infections. Performed at [facility], [place] [address] [place], [place] [address]   SARS Coronavirus 2 (CEPHEID - Performed in [facility] hospital lab), Hosp Order     Status: None   Collection Time: 06/07/19  5:18 AM   Specimen: Nasopharyngeal Swab  Result Value Ref Range Status   SARS Coronavirus 2 NEGATIVE NEGATIVE Final    Comment: (NOTE) If result is NEGATIVE SARS-CoV-2 target nucleic acids are NOT DETECTED. The SARS-CoV-2 RNA is generally detectable in upper and lower  respiratory specimens during the acute phase of infection. The lowest  concentration of SARS-CoV-2 viral copies this assay can detect is 250  copies / mL. A negative result does not preclude SARS-CoV-2 infection  and should not be used as the sole basis for treatment or other  patient management decisions.  A negative result may occur with  improper specimen collection / handling, submission of specimen other  than nasopharyngeal swab, presence of viral mutation(s) within the  areas targeted by this assay, and inadequate number of viral copies  (<250 copies / mL). A negative result must be combined with clinical  observations, patient history, and epidemiological information. If result is POSITIVE SARS-CoV-2 target nucleic acids are DETECTED. The SARS-CoV-2 RNA is generally detectable in upper and lower  respiratory specimens dur ing the acute phase of infection.  Positive  results are indicative of active infection with SARS-CoV-2.  Clinical  correlation with patient history and other diagnostic information is  necessary  to determine patient infection status.  Positive results do  not rule out bacterial infection or co-infection with other viruses. If result is PRESUMPTIVE POSTIVE SARS-CoV-2 nucleic acids MAY BE PRESENT.   A presumptive positive result was obtained on the submitted specimen  and confirmed on repeat testing.  While 2019 novel coronavirus  (SARS-CoV-2) nucleic acids may be present in the submitted sample  additional confirmatory testing may be necessary for epidemiological  and / or clinical management purposes  to differentiate between  SARS-CoV-2 and other Sarbecovirus currently known to infect humans.  If clinically indicated additional testing with an alternate test  methodology [phone]) is advised. The SARS-CoV-2 RNA is generally  detectable in upper and lower respiratory sp ecimens during the acute  phase of infection. The expected result is Negative. Fact Sheet for Patients:  StrictlyIdeas.no Fact Sheet for Healthcare Providers: BankingDealers.co.za This test is not yet approved or cleared by the [place] FDA and has been authorized for detection and/or diagnosis of SARS-CoV-2 by FDA under an Emergency Use Authorization (EUA).  This EUA will remain in effect (meaning this test can be used) for the duration of the COVID-19 declaration under Section 564(b)(1) of the Act, 21 U.S.C. section 360bbb-3(b)(1), unless the authorization is terminated or revoked sooner. Performed at [facility], [place] [address], [place],  [address]      Radiological Exams on Admission: Dg Chest Portable 1 View  Result Date: 06/06/2019 CLINICAL DATA:  Shortness of breath, fall, LEFT flank pain, hypertension EXAM: PORTABLE CHEST 1 VIEW COMPARISON:  Portable exam at 1647 hrs compared to 11/25/2018 FINDINGS: Normal heart size, mediastinal contours, and pulmonary vascularity. Bibasilar atelectasis. Upper lungs clear. No pleural effusion or  pneumothorax. No acute osseous findings. IMPRESSION: Bibasilar atelectasis. Electronically Signed   By: Lavonia Dana M.D.   On: 06/06/2019 17:52     EKG: Independently reviewed.  Sinus rhythm, QTC 440, LAD, LAE, early R wave progression, PVC    Assessment/Plan Principal Problem:   Acute respiratory failure with hypoxia (HCC) Active Problems:   Diabetes mellitus type II, controlled ([place])   Gastroesophageal reflux disease without esophagitis   Hyperlipidemia LDL goal <100   OSA on CPAP   HTN (hypertension)   AKI (acute kidney injury) (HCC)   Elevated troponin   Acute respiratory failure with hypoxia ([place]): Etiology is not clear. [facility] Covid19 test negative.  Chest x-ray showed a bilateral atelectasis.  No fever or leukocytosis, low suspicions for pneumonia.  Patient has elevated d-dimer 3.68, will need to rule out PE.  Patient was given 1 dose of Lovenox 75 mg at 7:00 PM.  Pt has 1+ pitting leg edema bilaterally, CHF with potential differential diagnosis, but patient's BNP is only 73 and no pulmonary edema on chest x-ray.  We will get 2D echo for further evaluation.  - Will place on SDU for obs - Mucinex for cough  - Atrovent inhaler, prn albuterol or Xopenex inhaler - f/u Cepheid covid 19 test - Follow up blood culture x2, sputum culture and respiratory virus panel, plus Flu pcr - Nasal cannula oxygen to maintain oxygen saturation above 93% -V.Q scan in AM -LE doppler to r/o DVT due to positive D-dimer -2d echo  Diabetes mellitus type II, controlled ([place]): Last A1c 7.2 on 04/01/19, fairly controled. Patient is taking metformin at home -SSI  Gastroesophageal reflux disease without esophagitis: -Protonix  Hyperlipidemia LDL goal <100: -Tricor and zocor  OSA:  on CPAP at home -cpap  HTN:  -Hold lisinopril due to AKI -Start amlodipine 5 mg daily -IV hydralazine prn  AKI (acute kidney injury) ([place]): Likely due to dehydration and continuation of lisinopril -IV fluid:  Patient received  1L of NS in ED -hold lisinopril -f/u by BMP  Elevated troponin: Troponin 19, 21, 20. No CP.  Most likely due to demand ischemia secondary to hypoxia and stress. -Trend troponin -Check A1c, FLP - Repeat EKG in morning -Continue aspirin, Zocor and TriCor -f/u 2d echo.    DVT ppx: SQ Lovenox Code Status: Full code Family Communication: None at bed side.    Disposition Plan:  Anticipate discharge back to previous home environment Consults called:  none Admission status:   SDU/obs  Date of Service 06/07/2019    [place] [facility]   If 7PM-7AM, please contact night-coverage [email or web address] Password [facility] 06/07/2019, 6:19 AM

## 2019-06-07 NOTE — ED Notes (Signed)
Paged admitting MD 

## 2019-06-07 NOTE — ED Notes (Signed)
Spoke with Tammy at [facility] and notified that the patient requires a higher level of care now.

## 2019-06-07 NOTE — ED Notes (Signed)
Rose, RN on 2W, called and given update on patients condition and notified of new orders and upgraded level of care required.

## 2019-06-07 NOTE — Progress Notes (Signed)
Pt arrived via stretcher by [facility]. A&OX4. On O2  6L/[place]. Pt denies CP and SOB and in NAD. Pt placed on progressive monitor, VS taken, and oriented to room. MAE for complete assessment see flowsheet. Hospitalist was informed that pt arrived to 2w32.

## 2019-06-07 NOTE — Progress Notes (Addendum)
Increased patients to 6 liters and repositioned patient due to patient's SPO2 falling to 86%.  Patient's SPO2 increased and remained between 96% and 97%.  Added humidity to Patient's O2.

## 2019-06-07 NOTE — Progress Notes (Signed)
Lower extremity venous has been completed.   Preliminary results in CV Proc.   Abram Sander 06/07/2019 11:10 AM

## 2019-06-07 NOTE — ED Notes (Signed)
Return phone call received from accepting MD. Level of care changed and request of EDP to evaluated patient. Will continue to monitor. Pt is now requiring [facility]

## 2019-06-07 NOTE — ED Notes (Signed)
RN sec alerted Nurse that patient monitor was alarming. RN assessed patient and his O2 sat was 85 on 3Ls. RT came with RN we reposiitoned patient and bumped O2 to 4L [place]. Will continue to monitor. Pt states that he DOES NOT feel any more short of breath than before.

## 2019-06-07 NOTE — Progress Notes (Signed)
  Echocardiogram 2D Echocardiogram has been performed.  Matthew Nash 06/07/2019, 3:47 PM

## 2019-06-07 NOTE — Progress Notes (Addendum)
Matthew Nash is a 83 y.o. male with medical history significant of hypertension, hyperlipidemia, diabetes mellitus, GERD, anxiety, BPH, HBV, PVD, OSA on CPAP, who presents with shortness of breath.  06/07/19: Seen and examined at his bedside.  He appears short winded during conversation.  Denies dyspnea or chest pain.  Elevated d-dimer on presentation 3.68.  Doppler ultrasound of lower extremities negative for DVT.  VQ scan pending.  Independently reviewed chest x-ray done on admission which showed bibasilar atelectasis.  Negative COVID-19 test.  Will DC airborne isolation precautions.  Please refer to H&P dictated by Dr. Blaine Hamper on 06/07/2019 for further details of the assessment and plan.  UPDATE: Hypoxic with O2 sat 89% on 6L. ABG suggestive of respiratory acidosis with PH 7.2 and PCO2 67. Stat BIPAP. D/c diet. NPO while on BIPAP. VQ scan no evidence of PE. Will start IV solumedrol 60 mg BID and small dose lasix. 2D echo pending results. Will obtain procalcitonin and lactic acid.

## 2019-06-08 ENCOUNTER — Inpatient Hospital Stay (HOSPITAL_COMMUNITY): Payer: Medicare Other

## 2019-06-08 DIAGNOSIS — J9601 Acute respiratory failure with hypoxia: Principal | ICD-10-CM

## 2019-06-08 LAB — BLOOD GAS, ARTERIAL
Acid-Base Excess: 5.7 mmol/L — ABNORMAL HIGH (ref 0.0–2.0)
Acid-Base Excess: 6.7 mmol/L — ABNORMAL HIGH (ref 0.0–2.0)
Bicarbonate: 32.2 mmol/L — ABNORMAL HIGH (ref 20.0–28.0)
Bicarbonate: 32.4 mmol/L — ABNORMAL HIGH (ref 20.0–28.0)
Delivery systems: POSITIVE
Drawn by: 33100
Drawn by: 28338
Expiratory PAP: 5
Expiratory PAP: 5
FIO2: 0.4
FIO2: 40
Inspiratory PAP: 10
Inspiratory PAP: 10
O2 Saturation: 96.7 %
O2 Saturation: 94 %
Patient temperature: 98.2
Patient temperature: 98.9
RATE: 12 resp/min
pCO2 arterial: 71.1 mmHg (ref 32.0–48.0)
pCO2 arterial: 63.1 mmHg — ABNORMAL HIGH (ref 32.0–48.0)
pH, Arterial: 7.276 — ABNORMAL LOW (ref 7.350–7.450)
pH, Arterial: 7.332 — ABNORMAL LOW (ref 7.350–7.450)
pO2, Arterial: 93.6 mmHg (ref 83.0–108.0)
pO2, Arterial: 75.5 mmHg — ABNORMAL LOW (ref 83.0–108.0)

## 2019-06-08 LAB — BASIC METABOLIC PANEL
Anion gap: 12 (ref 5–15)
BUN: 31 mg/dL — ABNORMAL HIGH (ref 8–23)
CO2: 29 mmol/L (ref 22–32)
Calcium: 9.6 mg/dL (ref 8.9–10.3)
Chloride: 99 mmol/L (ref 98–111)
Creatinine, Ser: 1.58 mg/dL — ABNORMAL HIGH (ref 0.61–1.24)
GFR calc Af Amer: 47 mL/min — ABNORMAL LOW (ref >60)
GFR calc non Af Amer: 40 mL/min — ABNORMAL LOW (ref >60)
Glucose, Bld: 182 mg/dL — ABNORMAL HIGH (ref 70–99)
Potassium: 5.1 mmol/L (ref 3.5–5.1)
Sodium: 140 mmol/L (ref 135–145)

## 2019-06-08 LAB — CBC
HCT: 41.1 % (ref 39.0–52.0)
Hemoglobin: 12.4 g/dL — ABNORMAL LOW (ref 13.0–17.0)
MCH: 27.6 pg (ref 26.0–34.0)
MCHC: 30.2 g/dL (ref 30.0–36.0)
MCV: 91.3 fL (ref 80.0–100.0)
Platelets: 310 10*3/uL (ref 150–400)
RBC: 4.5 MIL/uL (ref 4.22–5.81)
RDW: 13 % (ref 11.5–15.5)
WBC: 5.9 10*3/uL (ref 4.0–10.5)
nRBC: 0 % (ref 0.0–0.2)

## 2019-06-08 LAB — LIPID PANEL
Cholesterol: 130 mg/dL (ref 0–200)
HDL: 41 mg/dL (ref >40)
LDL Cholesterol: 71 mg/dL (ref 0–99)
Total CHOL/HDL Ratio: 3.2 RATIO
Triglycerides: 89 mg/dL (ref <150)
VLDL: 18 mg/dL (ref 0–40)

## 2019-06-08 LAB — HEMOGLOBIN A1C
Hgb A1c MFr Bld: 6.8 % — ABNORMAL HIGH (ref 4.8–5.6)
Mean Plasma Glucose: 148.46 mg/dL

## 2019-06-08 LAB — GLUCOSE, CAPILLARY
Glucose-Capillary: 168 mg/dL — ABNORMAL HIGH (ref 70–99)
Glucose-Capillary: 164 mg/dL — ABNORMAL HIGH (ref 70–99)
Glucose-Capillary: 139 mg/dL — ABNORMAL HIGH (ref 70–99)
Glucose-Capillary: 184 mg/dL — ABNORMAL HIGH (ref 70–99)
Glucose-Capillary: 189 mg/dL — ABNORMAL HIGH (ref 70–99)

## 2019-06-08 MED ORDER — CEPHALEXIN 500 MG PO CAPS
500.0000 mg | ORAL_CAPSULE | Freq: Two times a day (BID) | ORAL | Status: DC
  Administered 2019-06-08 – 2019-06-11 (×4): 500 mg via ORAL
  Filled 2019-06-08 (×5): qty 1

## 2019-06-08 MED ORDER — HYDRALAZINE HCL 20 MG/ML IJ SOLN: 10.0000 mg | Freq: Four times a day (QID) | INTRAMUSCULAR | Status: DC | PRN

## 2019-06-08 MED ORDER — AMLODIPINE BESYLATE 10 MG PO TABS
10.0000 mg | ORAL_TABLET | Freq: Every day | ORAL | Status: DC
  Administered 2019-06-08 – 2019-06-11 (×4): 10 mg via ORAL
  Filled 2019-06-08 (×4): qty 1

## 2019-06-08 NOTE — Progress Notes (Signed)
CRITICAL VALUE ALERT  Critical Value:  CO2 71.1 pH 7.28  Date & Time Notied:  06/08/19 0544  Provider Notified: 06/08/19 0544  Orders Received/Actions taken: no current new orders

## 2019-06-08 NOTE — Progress Notes (Addendum)
Matthew Nash  PROGRESS NOTE    Matthew Nash  BHA:[id number] DOB: Nov 27, 1936 DOA: 06/06/2019 PCP: Darreld Mclean, MD   Brief Narrative:   Matthew Nash is a 83 y.o. male with medical history significant of hypertension, hyperlipidemia, diabetes mellitus, GERD, anxiety, BPH, HBV, PVD, OSA on CPAP, who presents with shortness of breath.  Patient states that he has been having shortness of breath in the past several days, which has been progressively getting worse.  He has cough with little mucus production.  He has intermittent runny nose, but no sore throat.  Denies fever or chills.  No chest pain.  Patient was found to have oxygen desaturation to 77% on room air, and temporarily required BiPAP in ED, currently on 6 L nasal cannula oxygen with oxygen saturation 95%.  Patient denies nausea, vomiting, diarrhea, abdominal pain, symptoms of UTI or unilateral weakness.  Patient was found to have elevated d-dimer 3.68, 1 dose of Lovenox 75 mg was given in ED.   Assessment & Plan:   Principal Problem:   Acute respiratory failure with hypoxia (HCC) Active Problems:   Diabetes mellitus type II, controlled ([place])   Gastroesophageal reflux disease without esophagitis   Hyperlipidemia LDL goal <100   OSA on CPAP   HTN (hypertension)   AKI (acute kidney injury) (HCC)   Elevated troponin   Acute respiratory failure with hypoxia     - Etiology is not clear. Abbott and Cepheid [id number] tests are negative.       - Chest x-ray showed a bilateral atelectasis; CT chest w/o constrast shows the same     - No fever or leukocytosis, low suspicions for pneumonia.       - Patient has elevated d-dimer 3.68, VQ scan negative (unable to do CTA d/t renal function); Patient was given 1 dose of Lovenox 75 mg at 7:00 PM.      - TTE EF 65%     - placed on BiPap d/t hypercapneia (uses CPAP at night d/t OSA)     - Mucinex for cough      - Atrovent inhaler, prn albuterol or Xopenex inhaler; solumedrol     - Follow up blood  culture x2, sputum culture     - respiratory virus panel negative     - BLE doppler to r/o DVT negative  Diabetes mellitus type II, controlled     - A1c is 6.8     - Patient is taking metformin at home     - SSI; carb modified diet  Gastroesophageal reflux disease without esophagitis     - Protonix  Hyperlipidemia LDL goal <100:     - zocor  OSA     - on CPAP at home     - cpap at night  HTN:      - Hold lisinopril due to AKI     - amlodipine 5 mg daily     - IV hydralazine prn  AKI      - Likely due to dehydration and continuation of lisinopril     - IV fluid: Patient received  1L of NS in ED     - hold lisinopril     - f/u by BMP  Elevated troponin     - Troponin 19, 21, 20.      - No CP.       - Most likely due to demand ischemia secondary to hypoxia and stress.  Chronic urinary retentions     - continue  I&O caths     - on chronic keflex 500mg  qday, continue  DVT prophylaxis: lovenox Code Status: FULL   Disposition Plan: TBD    Subjective: "I don't want any more of that pill."  Objective: Vitals:   06/08/19 1109 06/08/19 1110 06/08/19 1131 06/08/19 1358  BP:   (!) 142/55   Pulse: 71  68   Resp: (!) 21  20   Temp:  98.9 F (37.2 C)    TempSrc:  Axillary    SpO2: 95%  95% 94%  Weight:      Height:        Intake/Output Summary (Last 24 hours) at 06/08/2019 1403 Last data filed at 06/08/2019 1107 Gross per 24 hour  Intake 30 ml  Output 1950 ml  Net -1920 ml   Filed Weights   06/07/19 0458  Weight: 102.6 kg    Examination:  General: 83 y.o. male resting in bed in NAD Cardiovascular: RRR, +S1, S2, no m/g/r, equal pulses throughout Respiratory: decreased at bases, and tight superiorly, somewhat breathless w/ speech GI: BS+, NDNT, no masses noted, no organomegaly noted MSK: No e/c/c Skin: No rashes, bruises, ulcerations noted Neuro: A&O x 3, no focal deficits     Data Reviewed: I have personally reviewed following labs and imaging  studies.  CBC: Recent Labs  Lab 06/06/19 1620 06/08/19 0523  WBC 9.3 5.9  NEUTROABS 7.5  --   HGB 11.9* 12.4*  HCT 39.3 41.1  MCV 92.5 91.3  PLT 282 263   Basic Metabolic Panel: Recent Labs  Lab 06/06/19 1620 06/07/19 0620 06/08/19 0523  NA 139  --  140  K 4.6  --  5.1  CL 103  --  99  CO2 27  --  29  GLUCOSE 140*  --  182*  BUN 39*  --  31*  CREATININE 2.12* 1.79* 1.58*  CALCIUM 9.2  --  9.6  MG 2.0  --   --    GFR: Estimated Creatinine Clearance: 44 mL/min (A) (by C-G formula based on SCr of 1.58 mg/dL (H)). Liver Function Tests: Recent Labs  Lab 06/06/19 1620  AST 14*  ALT 20  ALKPHOS 41  BILITOT 0.4  PROT 6.6  ALBUMIN 3.2*   Recent Labs  Lab 06/06/19 1620  LIPASE 43   No results for input(s): AMMONIA in the last 168 hours. Coagulation Profile: No results for input(s): INR, PROTIME in the last 168 hours. Cardiac Enzymes: No results for input(s): CKTOTAL, CKMB, CKMBINDEX, TROPONINI in the last 168 hours. BNP (last 3 results) No results for input(s): PROBNP in the last 8760 hours. HbA1C: Recent Labs    06/08/19 0523  HGBA1C 6.8*   CBG: Recent Labs  Lab 06/07/19 0639 06/07/19 1203 06/07/19 1651 06/07/19 2240  GLUCAP 120* 103* 96 166*   Lipid Profile: Recent Labs    06/08/19 0523  CHOL 130  HDL 41  LDLCALC 71  TRIG 89  CHOLHDL 3.2   Thyroid Function Tests: No results for input(s): TSH, T4TOTAL, FREET4, T3FREE, THYROIDAB in the last 72 hours. Anemia Panel: No results for input(s): VITAMINB12, FOLATE, FERRITIN, TIBC, IRON, RETICCTPCT in the last 72 hours. Sepsis Labs: Recent Labs  Lab 06/07/19 1553  PROCALCITON <0.10  LATICACIDVEN 0.6    Recent Results (from the past 240 hour(s))  SARS Coronavirus 2 (Hosp order,Performed in [facility] lab via Abbott ID)     Status: None   Collection Time: 06/06/19  4:20 PM   Specimen: Dry Nasal Swab (Abbott  ID Now)  Result Value Ref Range Status   SARS Coronavirus 2 (Abbott ID Now)  NEGATIVE NEGATIVE Final    Comment: (NOTE) Interpretive Result Comment(s): COVID 19 Positive SARS CoV 2 target nucleic acids are DETECTED. The SARS CoV 2 RNA is generally detectable in upper and lower respiratory specimens during the acute phase of infection.  Positive results are indicative of active infection with SARS CoV 2.  Clinical correlation with patient history and other diagnostic information is necessary to determine patient infection status.  Positive results do not rule out bacterial infection or coinfection with other viruses. The expected result is Negative. COVID 19 Negative SARS CoV 2 target nucleic acids are NOT DETECTED. The SARS CoV 2 RNA is generally detectable in upper and lower respiratory specimens during the acute phase of infection.  Negative results do not preclude SARS CoV 2 infection, do not rule out coinfections with other pathogens, and should not be used as the sole basis for treatment or other patient management decisions.  Negative results must be combined with clinical  observations, patient history, and epidemiological information. The expected result is Negative. Invalid Presence or absence of SARS CoV 2 nucleic acids cannot be determined. Repeat testing was performed on the submitted specimen and repeated Invalid results were obtained.  If clinically indicated, additional testing on a new specimen with an alternate test methodology [phone]) is advised.  The SARS CoV 2 RNA is generally detectable in upper and lower respiratory specimens during the acute phase of infection. The expected result is Negative. Fact Sheet for Patients:  GolfingFamily.no Fact Sheet for Healthcare Providers: https://www.hernandez-brewer.com/ This test is not yet approved or cleared by the [place] FDA and has been authorized for detection and/or diagnosis of SARS CoV 2 by FDA under an Emergency Use Authorization (EUA).  This EUA  will remain in effect (meaning this test can be used) for the duration of the COVID19 d eclaration under Section 564(b)(1) of the Act, 21 U.S.C. section [phone] 3(b)(1), unless the authorization is terminated or revoked sooner. Performed at [facility], [address] [place], [place] [address]   Blood culture (routine x 2)     Status: None (Preliminary result)   Collection Time: 06/06/19  4:34 PM   Specimen: BLOOD LEFT HAND  Result Value Ref Range Status   Specimen Description   Final    BLOOD LEFT HAND Performed at [facility], [place] [address], [place], [place] [address]    Special Requests   Final    BOTTLES DRAWN AEROBIC ONLY Blood Culture results may not be optimal due to an inadequate volume of blood received in culture bottles Performed at [facility], [address] [place], [place] [address]    Culture   Final    NO GROWTH 2 DAYS Performed at [facility], [place] [address] [place], [place] [address]    Report Status PENDING  Incomplete  Urine culture     Status: None   Collection Time: 06/06/19  4:40 PM   Specimen: Urine, Random  Result Value Ref Range Status   Specimen Description   Final    URINE, RANDOM Performed at [facility], [address] [place], [place] [address]    Special Requests   Final    NONE Performed at [facility], [address] [place], [place] [address]    Culture   Final    NO GROWTH Performed  at [facility], [place] [address], [place], [place] [address]    Report Status 06/07/2019 FINAL  Final  MRSA PCR Screening     Status: None   Collection Time: 06/07/19  5:03 AM  Result Value Ref Range Status   MRSA by PCR NEGATIVE NEGATIVE Final    Comment:        The GeneXpert MRSA Assay (FDA approved for NASAL specimens only), is one component of a comprehensive MRSA colonization surveillance program. It is not intended to diagnose MRSA infection nor to guide or monitor  treatment for MRSA infections. Performed at [facility], [place] [address] [place], [place] [address]   SARS Coronavirus 2 (CEPHEID - Performed in [facility] lab), Hosp Order     Status: None   Collection Time: 06/07/19  5:18 AM   Specimen: Nasopharyngeal Swab  Result Value Ref Range Status   SARS Coronavirus 2 NEGATIVE NEGATIVE Final    Comment: (NOTE) If result is NEGATIVE SARS-CoV-2 target nucleic acids are NOT DETECTED. The SARS-CoV-2 RNA is generally detectable in upper and lower  respiratory specimens during the acute phase of infection. The lowest  concentration of SARS-CoV-2 viral copies this assay can detect is 250  copies / mL. A negative result does not preclude SARS-CoV-2 infection  and should not be used as the sole basis for treatment or other  patient management decisions.  A negative result may occur with  improper specimen collection / handling, submission of specimen other  than nasopharyngeal swab, presence of viral mutation(s) within the  areas targeted by this assay, and inadequate number of viral copies  (<250 copies / mL). A negative result must be combined with clinical  observations, patient history, and epidemiological information. If result is POSITIVE SARS-CoV-2 target nucleic acids are DETECTED. The SARS-CoV-2 RNA is generally detectable in upper and lower  respiratory specimens dur ing the acute phase of infection.  Positive  results are indicative of active infection with SARS-CoV-2.  Clinical  correlation with patient history and other diagnostic information is  necessary to determine patient infection status.  Positive results do  not rule out bacterial infection or co-infection with other viruses. If result is PRESUMPTIVE POSTIVE SARS-CoV-2 nucleic acids MAY BE PRESENT.   A presumptive positive result was obtained on the submitted specimen  and confirmed on repeat testing.  While 2019 novel coronavirus  (SARS-CoV-2) nucleic acids  may be present in the submitted sample  additional confirmatory testing may be necessary for epidemiological  and / or clinical management purposes  to differentiate between  SARS-CoV-2 and other Sarbecovirus currently known to infect humans.  If clinically indicated additional testing with an alternate test  methodology [id number]) is advised. The SARS-CoV-2 RNA is generally  detectable in upper and lower respiratory sp ecimens during the acute  phase of infection. The expected result is Negative. Fact Sheet for Patients:  StrictlyIdeas.no Fact Sheet for Healthcare Providers: BankingDealers.co.za This test is not yet approved or cleared by the [place] FDA and has been authorized for detection and/or diagnosis of SARS-CoV-2 by FDA under an Emergency Use Authorization (EUA).  This EUA will remain in effect (meaning this test can be used) for the duration of the COVID-19 declaration under Section 564(b)(1) of the Act, 21 U.S.C. section 360bbb-3(b)(1), unless the authorization is terminated or revoked sooner. Performed at [facility], [place] [address], [place], [place] [address]   Respiratory Panel by PCR     Status: None  Collection Time: 06/07/19  6:06 AM   Specimen: Nasopharyngeal Swab; Respiratory  Result Value Ref Range Status   Adenovirus NOT DETECTED NOT DETECTED Final   Coronavirus 229E NOT DETECTED NOT DETECTED Final    Comment: (NOTE) The Coronavirus on the Respiratory Panel, DOES NOT test for the novel  Coronavirus (2019 nCoV)    Coronavirus HKU1 NOT DETECTED NOT DETECTED Final   Coronavirus NL63 NOT DETECTED NOT DETECTED Final   Coronavirus OC43 NOT DETECTED NOT DETECTED Final   Metapneumovirus NOT DETECTED NOT DETECTED Final   Rhinovirus / Enterovirus NOT DETECTED NOT DETECTED Final   Influenza A NOT DETECTED NOT DETECTED Final   Influenza B NOT DETECTED NOT DETECTED Final   Parainfluenza Virus 1 NOT DETECTED  NOT DETECTED Final   Parainfluenza Virus 2 NOT DETECTED NOT DETECTED Final   Parainfluenza Virus 3 NOT DETECTED NOT DETECTED Final   Parainfluenza Virus 4 NOT DETECTED NOT DETECTED Final   Respiratory Syncytial Virus NOT DETECTED NOT DETECTED Final   Bordetella pertussis NOT DETECTED NOT DETECTED Final   Chlamydophila pneumoniae NOT DETECTED NOT DETECTED Final   Mycoplasma pneumoniae NOT DETECTED NOT DETECTED Final    Comment: Performed at [facility], [address] [place], [place] [address]         Radiology Studies: Nm Pulmonary Perfusion  Result Date: 06/07/2019 CLINICAL DATA:  Chest pain.  Concern for pulmonary embolism. EXAM: NUCLEAR MEDICINE PERFUSION LUNG SCAN TECHNIQUE: Perfusion images were obtained in multiple projections after intravenous injection of radiopharmaceutical. RADIOPHARMACEUTICALS:  1.7 mCi Tc-40m MAA COMPARISON:  Radiograph 06/06/2019 FINDINGS: No wedge-shaped peripheral perfusion defects within the LEFT or RIGHT lung to localize acute pulmonary embolism. Decreased regional ventilation to the LEFT lung compared to the RIGHT. IMPRESSION: No evidence of acute pulmonary embolism. Electronically Signed   By: Suzy Bouchard M.D.   On: 06/07/2019 14:18   Dg Chest Portable 1 View  Result Date: 06/06/2019 CLINICAL DATA:  Shortness of breath, fall, LEFT flank pain, hypertension EXAM: PORTABLE CHEST 1 VIEW COMPARISON:  Portable exam at 1647 hrs compared to 11/25/2018 FINDINGS: Normal heart size, mediastinal contours, and pulmonary vascularity. Bibasilar atelectasis. Upper lungs clear. No pleural effusion or pneumothorax. No acute osseous findings. IMPRESSION: Bibasilar atelectasis. Electronically Signed   By: Lavonia Dana M.D.   On: 06/06/2019 17:52   Vas [place] Lower Extremity Venous (dvt)  Result Date: 06/07/2019  Lower Venous Study Indications: Positive d dimer.  Limitations: Patient positioning. Performing Technologist: Abram Sander RVS  Examination Guidelines: A  complete evaluation includes B-mode imaging, spectral Doppler, color Doppler, and power Doppler as needed of all accessible portions of each vessel. Bilateral testing is considered an integral part of a complete examination. Limited examinations for reoccurring indications may be performed as noted.  +---------+---------------+---------+-----------+----------+--------------+  RIGHT     Compressibility Phasicity Spontaneity Properties Summary         +---------+---------------+---------+-----------+----------+--------------+  CFV       Full            Yes       Yes                                    +---------+---------------+---------+-----------+----------+--------------+  SFJ       Full                                                             +---------+---------------+---------+-----------+----------+--------------+  FV Prox   Full                                                             +---------+---------------+---------+-----------+----------+--------------+  FV Mid    Full                                                             +---------+---------------+---------+-----------+----------+--------------+  FV Distal Full                                                             +---------+---------------+---------+-----------+----------+--------------+  PFV       Full                                                             +---------+---------------+---------+-----------+----------+--------------+  POP       Full            Yes       Yes                                    +---------+---------------+---------+-----------+----------+--------------+  PTV       Full                                                             +---------+---------------+---------+-----------+----------+--------------+  PERO                                                       Not visualized  +---------+---------------+---------+-----------+----------+--------------+    +---------+---------------+---------+-----------+----------+--------------+  LEFT      Compressibility Phasicity Spontaneity Properties Summary         +---------+---------------+---------+-----------+----------+--------------+  CFV       Full            Yes       Yes                                    +---------+---------------+---------+-----------+----------+--------------+  SFJ       Full                                                             +---------+---------------+---------+-----------+----------+--------------+  FV Prox   Full                                                             +---------+---------------+---------+-----------+----------+--------------+  FV Mid    Full                                                             +---------+---------------+---------+-----------+----------+--------------+  FV Distal Full                                                             +---------+---------------+---------+-----------+----------+--------------+  PFV       Full                                                             +---------+---------------+---------+-----------+----------+--------------+  POP       Full            Yes       Yes                                    +---------+---------------+---------+-----------+----------+--------------+  PTV       Full                                                             +---------+---------------+---------+-----------+----------+--------------+  PERO                                                       Not visualized  +---------+---------------+---------+-----------+----------+--------------+     Summary: Right: There is no evidence of deep vein thrombosis in the lower extremity. No cystic structure found in the popliteal fossa. Left: There is no evidence of deep vein thrombosis in the lower extremity. No cystic structure found in the popliteal fossa.  *See table(s) above for measurements and observations. Electronically signed by Curt Jews MD on 06/07/2019 at 3:52:23 PM.    Final         Scheduled Meds:  amLODipine  10 mg Oral Daily   aspirin EC  81 mg Oral Daily   calcium-vitamin D  1 tablet Oral BID   clonazePAM  0.25 mg Oral QHS   enoxaparin (LOVENOX) injection  40 mg Subcutaneous Q24H   fenofibrate  160 mg Oral Daily   insulin aspart  0-5 Units Subcutaneous QHS   insulin aspart  0-9 Units Subcutaneous TID WC   ipratropium  0.5 mg Nebulization TID   levalbuterol  0.63 mg Nebulization TID   Melatonin  9 mg Oral QHS   methylPREDNISolone (SOLU-MEDROL) injection  60 mg Intravenous Q12H   multivitamin with minerals  1 tablet Oral Daily   pantoprazole  40 mg Oral Daily   polycarbophil  625 mg Oral BID   saccharomyces boulardii  250 mg Oral Daily   simvastatin  10 mg Oral QHS   vitamin C  500 mg Oral Daily   Continuous Infusions:  dextrose 30 mL/hr at 06/07/19 2320     LOS: 1 day    Time spent: 35 minutes spent in the coordination of care today.     Jonnie Finner, DO [facility] Pager [phone]  If 7PM-7AM, please contact night-coverage [email or web address] Password [facility] 06/08/2019, 2:03 PM

## 2019-06-09 ENCOUNTER — Inpatient Hospital Stay (HOSPITAL_COMMUNITY): Payer: Medicare Other

## 2019-06-09 LAB — RENAL FUNCTION PANEL
Albumin: 2.6 g/dL — ABNORMAL LOW (ref 3.5–5.0)
Anion gap: 9 (ref 5–15)
BUN: 42 mg/dL — ABNORMAL HIGH (ref 8–23)
CO2: 33 mmol/L — ABNORMAL HIGH (ref 22–32)
Calcium: 9.9 mg/dL (ref 8.9–10.3)
Chloride: 99 mmol/L (ref 98–111)
Creatinine, Ser: 1.39 mg/dL — ABNORMAL HIGH (ref 0.61–1.24)
GFR calc Af Amer: 55 mL/min — ABNORMAL LOW (ref >60)
GFR calc non Af Amer: 47 mL/min — ABNORMAL LOW (ref >60)
Glucose, Bld: 211 mg/dL — ABNORMAL HIGH (ref 70–99)
Phosphorus: 2.2 mg/dL — ABNORMAL LOW (ref 2.5–4.6)
Potassium: 4.8 mmol/L (ref 3.5–5.1)
Sodium: 141 mmol/L (ref 135–145)

## 2019-06-09 LAB — CBC WITH DIFFERENTIAL/PLATELET
Abs Immature Granulocytes: 0.03 10*3/uL (ref 0.00–0.07)
Basophils Absolute: 0 10*3/uL (ref 0.0–0.1)
Basophils Relative: 0 %
Eosinophils Absolute: 0 10*3/uL (ref 0.0–0.5)
Eosinophils Relative: 0 %
HCT: 40.8 % (ref 39.0–52.0)
Hemoglobin: 12.5 g/dL — ABNORMAL LOW (ref 13.0–17.0)
Immature Granulocytes: 0 %
Lymphocytes Relative: 5 %
Lymphs Abs: 0.5 10*3/uL — ABNORMAL LOW (ref 0.7–4.0)
MCH: 27.4 pg (ref 26.0–34.0)
MCHC: 30.6 g/dL (ref 30.0–36.0)
MCV: 89.5 fL (ref 80.0–100.0)
Monocytes Absolute: 0.2 10*3/uL (ref 0.1–1.0)
Monocytes Relative: 2 %
Neutro Abs: 10 10*3/uL — ABNORMAL HIGH (ref 1.7–7.7)
Neutrophils Relative %: 93 %
Platelets: 387 10*3/uL (ref 150–400)
RBC: 4.56 MIL/uL (ref 4.22–5.81)
RDW: 13.1 % (ref 11.5–15.5)
WBC: 10.7 10*3/uL — ABNORMAL HIGH (ref 4.0–10.5)
nRBC: 0 % (ref 0.0–0.2)

## 2019-06-09 LAB — MAGNESIUM: Magnesium: 2 mg/dL (ref 1.7–2.4)

## 2019-06-09 LAB — GLUCOSE, CAPILLARY
Glucose-Capillary: 200 mg/dL — ABNORMAL HIGH (ref 70–99)
Glucose-Capillary: 186 mg/dL — ABNORMAL HIGH (ref 70–99)
Glucose-Capillary: 198 mg/dL — ABNORMAL HIGH (ref 70–99)
Glucose-Capillary: 165 mg/dL — ABNORMAL HIGH (ref 70–99)

## 2019-06-09 MED ORDER — SENNOSIDES-DOCUSATE SODIUM 8.6-50 MG PO TABS
1.0000 | ORAL_TABLET | Freq: Every day | ORAL | Status: DC
  Administered 2019-06-09: 1 via ORAL
  Filled 2019-06-09 (×2): qty 1

## 2019-06-09 MED ORDER — METHYLPREDNISOLONE SODIUM SUCC 125 MG IJ SOLR: 60.0000 mg | Freq: Every day | INTRAMUSCULAR | Status: DC

## 2019-06-09 NOTE — Progress Notes (Signed)
[name] Kitchen  PROGRESS NOTE    CHAMPION CORALES  VCB:[id number] DOB: 04/23/1936 DOA: 06/06/2019 PCP: Darreld Mclean, MD   Brief Narrative:   [name] a 83 y.o.malewith medical history significant ofhypertension, hyperlipidemia, diabetes mellitus, GERD, anxiety, BPH, HBV, PVD, OSA on CPAP, who presents with shortness of breath.  Patient states that he has been having shortness of breath in the past several days, which has been progressively getting worse. He has cough with little mucus production. He has intermittent runny nose, but no sore throat. Denies fever or chills. No chest pain. Patient was found to have oxygen desaturation to 77% on room air, and temporarily required BiPAP in ED, currently on 6 L nasal cannula oxygen with oxygen saturation 95%. Patient denies nausea,vomiting, diarrhea, abdominal pain, symptoms of UTI or unilateral weakness. Patient was found to have elevated d-dimer 3.68, 1 dose of Lovenox 75 mg was given in ED.   Assessment & Plan:   Principal Problem:   Acute respiratory failure with hypoxia (HCC) Active Problems:   Diabetes mellitus type II, controlled ([place])   Gastroesophageal reflux disease without esophagitis   Hyperlipidemia LDL goal <100   OSA on CPAP   HTN (hypertension)   AKI (acute kidney injury) (HCC)   Elevated troponin   Acute respiratory failure with hypoxia     - Etiology is not clear. Abbott and Cepheid [id number] tests are negative.       - Chest x-ray showed a bilateral atelectasis; CT chest w/o constrast shows the same     - No fever or leukocytosis, low suspicions for pneumonia.       - Patient has elevated d-dimer 3.68, VQ scan negative (unable to do CTA d/t renal function); Patient was given 1 dose of Lovenox 75 mg at 7:00 PM.      - TTE EF 65%     - placed on BiPap d/t hypercapneia (uses CPAP at night d/t OSA)     - Mucinex for cough      - Atrovent inhaler, prn albuterol or Xopenex inhaler; solumedrol     - Follow up blood  culture x2, sputum culture     - respiratory virus panel negative     - BLE doppler to r/o DVT negative     - wean solumedrol down to qday today and if hold stable, will start a prednisone taper tomorrow     - still requiring O2 supplementation (he does NOT use O2 at home); wean as able  Diabetes mellitus type II, controlled     - A1c is 6.8     - Patient is taking metformin at home     - SSI; carb modified diet  Gastroesophageal reflux disease without esophagitis     - Protonix  Hyperlipidemia LDL goal <100:     - zocor  OSA     - on CPAP at home     - cpap at night  HTN:      - Hold lisinopril due to AKI     - amlodipine 5 mg daily     - IV hydralazine prn  AKI on CKD2     - Likely due to dehydration and continuation of lisinopril     - IV fluid: Patient received  1L of NS in ED     - hold lisinopril     - f/u by BMP  Elevated troponin     - Troponin 19, 21, 20.      - No  CP.       - Most likely due to demand ischemia secondary to hypoxia and stress.  Chronic urinary retention     - continue I&O caths     - on chronic keflex 500mg  qday, continue  Chronic constipation     - miralax, metamucil     - c/o of being bloated/worse constipation; check KUB, add senakot   DVT prophylaxis: lovenox Code Status: FULL   Disposition Plan: TBD    Antimicrobials:  . Keflex 500mg  qday    Subjective: "I feel like I'm breathing better."  Objective: Vitals:   06/08/19 2257 06/08/19 2300 06/09/19 0300 06/09/19 0627  BP:  (!) 119/39 (!) 118/46   Pulse: 75 70 67 68  Resp: 15 (!) 21 18 (!) 32  Temp:  98.1 F (36.7 C) 97.7 F (36.5 C)   TempSrc:  Axillary Axillary   SpO2: 93% 94% 95% 95%  Weight:   102.5 kg   Height:        Intake/Output Summary (Last 24 hours) at 06/09/2019 0708 Last data filed at 06/09/2019 0500 Gross per 24 hour  Intake 720 ml  Output 1900 ml  Net -1180 ml   Filed Weights   06/07/19 0458 06/09/19 0300  Weight: 102.6 kg 102.5 kg     Examination:  General: 83 y.o. male resting in bed in NAD Cardiovascular: RRR, +S1, S2, no m/g/r, equal pulses throughout Respiratory: clear this AM, however, still a little breathless w/ speech GI: BS+, NDNT, no masses noted, no organomegaly noted MSK: No e/c/c Skin: No rashes, bruises, ulcerations noted Neuro: A&O x 3, no focal deficits    Data Reviewed: I have personally reviewed following labs and imaging studies.  CBC: Recent Labs  Lab 06/06/19 1620 06/08/19 0523  WBC 9.3 5.9  NEUTROABS 7.5  --   HGB 11.9* 12.4*  HCT 39.3 41.1  MCV 92.5 91.3  PLT 282 616   Basic Metabolic Panel: Recent Labs  Lab 06/06/19 1620 06/07/19 0620 06/08/19 0523  NA 139  --  140  K 4.6  --  5.1  CL 103  --  99  CO2 27  --  29  GLUCOSE 140*  --  182*  BUN 39*  --  31*  CREATININE 2.12* 1.79* 1.58*  CALCIUM 9.2  --  9.6  MG 2.0  --   --    GFR: Estimated Creatinine Clearance: 44 mL/min (A) (by C-G formula based on SCr of 1.58 mg/dL (H)). Liver Function Tests: Recent Labs  Lab 06/06/19 1620  AST 14*  ALT 20  ALKPHOS 41  BILITOT 0.4  PROT 6.6  ALBUMIN 3.2*   Recent Labs  Lab 06/06/19 1620  LIPASE 43   No results for input(s): AMMONIA in the last 168 hours. Coagulation Profile: No results for input(s): INR, PROTIME in the last 168 hours. Cardiac Enzymes: No results for input(s): CKTOTAL, CKMB, CKMBINDEX, TROPONINI in the last 168 hours. BNP (last 3 results) No results for input(s): PROBNP in the last 8760 hours. HbA1C: Recent Labs    06/08/19 0523  HGBA1C 6.8*   CBG: Recent Labs  Lab 06/08/19 0754 06/08/19 1130 06/08/19 1406 06/08/19 1711 06/08/19 2105  GLUCAP 168* 164* 139* 184* 189*   Lipid Profile: Recent Labs    06/08/19 0523  CHOL 130  HDL 41  LDLCALC 71  TRIG 89  CHOLHDL 3.2   Thyroid Function Tests: No results for input(s): TSH, T4TOTAL, FREET4, T3FREE, THYROIDAB in the last 72 hours. Anemia Panel:  No results for input(s): VITAMINB12,  FOLATE, FERRITIN, TIBC, IRON, RETICCTPCT in the last 72 hours. Sepsis Labs: Recent Labs  Lab 06/07/19 1553  PROCALCITON <0.10  LATICACIDVEN 0.6    Recent Results (from the past 240 hour(s))  SARS Coronavirus 2 (Hosp order,Performed in [facility] lab via Abbott ID)     Status: None   Collection Time: 06/06/19  4:20 PM   Specimen: Dry Nasal Swab (Abbott ID Now)  Result Value Ref Range Status   SARS Coronavirus 2 (Abbott ID Now) NEGATIVE NEGATIVE Final    Comment: (NOTE) Interpretive Result Comment(s): COVID 19 Positive SARS CoV 2 target nucleic acids are DETECTED. The SARS CoV 2 RNA is generally detectable in upper and lower respiratory specimens during the acute phase of infection.  Positive results are indicative of active infection with SARS CoV 2.  Clinical correlation with patient history and other diagnostic information is necessary to determine patient infection status.  Positive results do not rule out bacterial infection or coinfection with other viruses. The expected result is Negative. COVID 19 Negative SARS CoV 2 target nucleic acids are NOT DETECTED. The SARS CoV 2 RNA is generally detectable in upper and lower respiratory specimens during the acute phase of infection.  Negative results do not preclude SARS CoV 2 infection, do not rule out coinfections with other pathogens, and should not be used as the sole basis for treatment or other patient management decisions.  Negative results must be combined with clinical  observations, patient history, and epidemiological information. The expected result is Negative. Invalid Presence or absence of SARS CoV 2 nucleic acids cannot be determined. Repeat testing was performed on the submitted specimen and repeated Invalid results were obtained.  If clinically indicated, additional testing on a new specimen with an alternate test methodology [phone]) is advised.  The SARS CoV 2 RNA is generally detectable in upper and  lower respiratory specimens during the acute phase of infection. The expected result is Negative. Fact Sheet for Patients:  GolfingFamily.no Fact Sheet for Healthcare Providers: https://www.hernandez-brewer.com/ This test is not yet approved or cleared by the [place] FDA and has been authorized for detection and/or diagnosis of SARS CoV 2 by FDA under an Emergency Use Authorization (EUA).  This EUA will remain in effect (meaning this test can be used) for the duration of the COVID19 d eclaration under Section 564(b)(1) of the Act, 21 U.S.C. section [phone] 3(b)(1), unless the authorization is terminated or revoked sooner. Performed at [facility], [address] [place], [place] [address]   Blood culture (routine x 2)     Status: None (Preliminary result)   Collection Time: 06/06/19  4:34 PM   Specimen: BLOOD LEFT HAND  Result Value Ref Range Status   Specimen Description   Final    BLOOD LEFT HAND Performed at [facility], [place] [address] [place], [place] [address]    Special Requests   Final    BOTTLES DRAWN AEROBIC ONLY Blood Culture results may not be optimal due to an inadequate volume of blood received in culture bottles Performed at [facility], [address] [place], [place] [address]    Culture   Final    NO GROWTH 2 DAYS Performed at [facility], [place] [address], [place],  [address]    Report Status PENDING  Incomplete  Urine culture     Status: None   Collection Time: 06/06/19  4:40 PM   Specimen:  Urine, Random  Result Value Ref Range Status   Specimen Description   Final    URINE, RANDOM Performed at [facility], [address] [place], [place] [address]    Special Requests   Final    NONE Performed at [facility], [address] [place], [place] [address]    Culture   Final    NO GROWTH Performed at [facility], [place] [address]  [place], [place] [address]    Report Status 06/07/2019 FINAL  Final  MRSA PCR Screening     Status: None   Collection Time: 06/07/19  5:03 AM  Result Value Ref Range Status   MRSA by PCR NEGATIVE NEGATIVE Final    Comment:        The GeneXpert MRSA Assay (FDA approved for NASAL specimens only), is one component of a comprehensive MRSA colonization surveillance program. It is not intended to diagnose MRSA infection nor to guide or monitor treatment for MRSA infections. Performed at [facility], [place] [address] [place], [place] [address]   SARS Coronavirus 2 (CEPHEID - Performed in [place] [facility] lab), Hosp Order     Status: None   Collection Time: 06/07/19  5:18 AM   Specimen: Nasopharyngeal Swab  Result Value Ref Range Status   SARS Coronavirus 2 NEGATIVE NEGATIVE Final    Comment: (NOTE) If result is NEGATIVE SARS-CoV-2 target nucleic acids are NOT DETECTED. The SARS-CoV-2 RNA is generally detectable in upper and lower  respiratory specimens during the acute phase of infection. The lowest  concentration of SARS-CoV-2 viral copies this assay can detect is 250  copies / mL. A negative result does not preclude SARS-CoV-2 infection  and should not be used as the sole basis for treatment or other  patient management decisions.  A negative result may occur with  improper specimen collection / handling, submission of specimen other  than nasopharyngeal swab, presence of viral mutation(s) within the  areas targeted by this assay, and inadequate number of viral copies  (<250 copies / mL). A negative result must be combined with clinical  observations, patient history, and epidemiological information. If result is POSITIVE SARS-CoV-2 target nucleic acids are DETECTED. The SARS-CoV-2 RNA is generally detectable in upper and lower  respiratory specimens dur ing the acute phase of infection.  Positive  results are indicative of active infection with SARS-CoV-2.  Clinical   correlation with patient history and other diagnostic information is  necessary to determine patient infection status.  Positive results do  not rule out bacterial infection or co-infection with other viruses. If result is PRESUMPTIVE POSTIVE SARS-CoV-2 nucleic acids MAY BE PRESENT.   A presumptive positive result was obtained on the submitted specimen  and confirmed on repeat testing.  While 2019 novel coronavirus  (SARS-CoV-2) nucleic acids may be present in the submitted sample  additional confirmatory testing may be necessary for epidemiological  and / or clinical management purposes  to differentiate between  SARS-CoV-2 and other Sarbecovirus currently known to infect humans.  If clinically indicated additional testing with an alternate test  methodology [phone]) is advised. The SARS-CoV-2 RNA is generally  detectable in upper and lower respiratory sp ecimens during the acute  phase of infection. The expected result is Negative. Fact Sheet for Patients:  StrictlyIdeas.no Fact Sheet for Healthcare Providers: BankingDealers.co.za This test is not yet approved or cleared by the [place] FDA and has been authorized for detection and/or diagnosis of  SARS-CoV-2 by FDA under an Emergency Use Authorization (EUA).  This EUA will remain in effect (meaning this test can be used) for the duration of the COVID-19 declaration under Section 564(b)(1) of the Act, 21 U.S.C. section 360bbb-3(b)(1), unless the authorization is terminated or revoked sooner. Performed at [facility], [place] [address], [place], [place] [address]   Respiratory Panel by PCR     Status: None   Collection Time: 06/07/19  6:06 AM   Specimen: Nasopharyngeal Swab; Respiratory  Result Value Ref Range Status   Adenovirus NOT DETECTED NOT DETECTED Final   Coronavirus 229E NOT DETECTED NOT DETECTED Final    Comment: (NOTE) The Coronavirus on the Respiratory Panel,  DOES NOT test for the novel  Coronavirus (2019 nCoV)    Coronavirus HKU1 NOT DETECTED NOT DETECTED Final   Coronavirus NL63 NOT DETECTED NOT DETECTED Final   Coronavirus OC43 NOT DETECTED NOT DETECTED Final   Metapneumovirus NOT DETECTED NOT DETECTED Final   Rhinovirus / Enterovirus NOT DETECTED NOT DETECTED Final   Influenza A NOT DETECTED NOT DETECTED Final   Influenza B NOT DETECTED NOT DETECTED Final   Parainfluenza Virus 1 NOT DETECTED NOT DETECTED Final   Parainfluenza Virus 2 NOT DETECTED NOT DETECTED Final   Parainfluenza Virus 3 NOT DETECTED NOT DETECTED Final   Parainfluenza Virus 4 NOT DETECTED NOT DETECTED Final   Respiratory Syncytial Virus NOT DETECTED NOT DETECTED Final   Bordetella pertussis NOT DETECTED NOT DETECTED Final   Chlamydophila pneumoniae NOT DETECTED NOT DETECTED Final   Mycoplasma pneumoniae NOT DETECTED NOT DETECTED Final    Comment: Performed at [facility], [place]. [address], [place], [place] [address]      Radiology Studies: Ct Chest Wo Contrast  Result Date: 06/08/2019 CLINICAL DATA:  Shortness of breath EXAM: CT CHEST WITHOUT CONTRAST TECHNIQUE: Multidetector CT imaging of the chest was performed following the standard protocol without IV contrast. COMPARISON:  Pulmonary V/Q scan, 06/07/2019, CT chest, 11/25/2018 FINDINGS: Cardiovascular: Scattered coronary artery calcifications. Normal heart size. No pericardial effusion. Mediastinum/Nodes: No enlarged mediastinal, hilar, or axillary lymph nodes. Thyroid gland, trachea, and esophagus demonstrate no significant findings. Lungs/Pleura: Minimal paraseptal emphysema. Bibasilar scarring or atelectasis of the lung bases. Upper Abdomen: No acute abnormality. Musculoskeletal: No chest wall mass or suspicious bone lesions identified. IMPRESSION: 1. Minimal paraseptal emphysema. Bibasilar scarring or atelectasis of the lung bases. No acute appearing airspace opacity. 2.  Scattered coronary artery calcifications.  Electronically Signed   By: Eddie Candle M.D.   On: 06/08/2019 14:24   Nm Pulmonary Perfusion  Result Date: 06/07/2019 CLINICAL DATA:  Chest pain.  Concern for pulmonary embolism. EXAM: NUCLEAR MEDICINE PERFUSION LUNG SCAN TECHNIQUE: Perfusion images were obtained in multiple projections after intravenous injection of radiopharmaceutical. RADIOPHARMACEUTICALS:  1.7 mCi Tc-29m MAA COMPARISON:  Radiograph 06/06/2019 FINDINGS: No wedge-shaped peripheral perfusion defects within the LEFT or RIGHT lung to localize acute pulmonary embolism. Decreased regional ventilation to the LEFT lung compared to the RIGHT. IMPRESSION: No evidence of acute pulmonary embolism. Electronically Signed   By: Suzy Bouchard M.D.   On: 06/07/2019 14:18   Vas [place] Lower Extremity Venous (dvt)  Result Date: 06/07/2019  Lower Venous Study Indications: Positive d dimer.  Limitations: Patient positioning. Performing Technologist: Abram Sander RVS  Examination Guidelines: A complete evaluation includes B-mode imaging, spectral Doppler, color Doppler, and power Doppler as needed of all accessible portions of each vessel. Bilateral testing is considered an integral part of a complete examination. Limited examinations  for reoccurring indications may be performed as noted.  +---------+---------------+---------+-----------+----------+--------------+ RIGHT    CompressibilityPhasicitySpontaneityPropertiesSummary        +---------+---------------+---------+-----------+----------+--------------+ CFV      Full           Yes      Yes                                 +---------+---------------+---------+-----------+----------+--------------+ SFJ      Full                                                        +---------+---------------+---------+-----------+----------+--------------+ FV Prox  Full                                                        +---------+---------------+---------+-----------+----------+--------------+  FV Mid   Full                                                        +---------+---------------+---------+-----------+----------+--------------+ FV DistalFull                                                        +---------+---------------+---------+-----------+----------+--------------+ PFV      Full                                                        +---------+---------------+---------+-----------+----------+--------------+ POP      Full           Yes      Yes                                 +---------+---------------+---------+-----------+----------+--------------+ PTV      Full                                                        +---------+---------------+---------+-----------+----------+--------------+ PERO                                                  Not visualized +---------+---------------+---------+-----------+----------+--------------+   +---------+---------------+---------+-----------+----------+--------------+ LEFT     CompressibilityPhasicitySpontaneityPropertiesSummary        +---------+---------------+---------+-----------+----------+--------------+ CFV      Full           Yes      Yes                                 +---------+---------------+---------+-----------+----------+--------------+  SFJ      Full                                                        +---------+---------------+---------+-----------+----------+--------------+ FV Prox  Full                                                        +---------+---------------+---------+-----------+----------+--------------+ FV Mid   Full                                                        +---------+---------------+---------+-----------+----------+--------------+ FV DistalFull                                                        +---------+---------------+---------+-----------+----------+--------------+ PFV      Full                                                         +---------+---------------+---------+-----------+----------+--------------+ POP      Full           Yes      Yes                                 +---------+---------------+---------+-----------+----------+--------------+ PTV      Full                                                        +---------+---------------+---------+-----------+----------+--------------+ PERO                                                  Not visualized +---------+---------------+---------+-----------+----------+--------------+     Summary: Right: There is no evidence of deep vein thrombosis in the lower extremity. No cystic structure found in the popliteal fossa. Left: There is no evidence of deep vein thrombosis in the lower extremity. No cystic structure found in the popliteal fossa.  *See table(s) above for measurements and observations. Electronically signed by Curt Jews MD on 06/07/2019 at 3:52:23 PM.    Final      Scheduled Meds: . amLODipine  10 mg Oral Daily  . aspirin EC  81 mg Oral Daily  . calcium-vitamin D  1 tablet Oral BID  . cephALEXin  500 mg Oral BID  . clonazePAM  0.25 mg Oral QHS  . enoxaparin (  LOVENOX) injection  40 mg Subcutaneous Q24H  . fenofibrate  160 mg Oral Daily  . insulin aspart  0-5 Units Subcutaneous QHS  . insulin aspart  0-9 Units Subcutaneous TID WC  . ipratropium  0.5 mg Nebulization TID  . levalbuterol  0.63 mg Nebulization TID  . Melatonin  9 mg Oral QHS  . methylPREDNISolone (SOLU-MEDROL) injection  60 mg Intravenous Q12H  . multivitamin with minerals  1 tablet Oral Daily  . pantoprazole  40 mg Oral Daily  . polycarbophil  625 mg Oral BID  . saccharomyces boulardii  250 mg Oral Daily  . simvastatin  10 mg Oral QHS  . vitamin C  500 mg Oral Daily   Continuous Infusions: . dextrose 30 mL/hr at 06/07/19 2320     LOS: 2 days    Time spent: 25 minutes spent in the coordination of care today.    Jonnie Finner, DO [facility] Pager [phone]  If 7PM-7AM, please contact night-coverage [email or web address] Password [email or web address] 06/09/2019, 7:08 AM

## 2019-06-10 LAB — GLUCOSE, CAPILLARY
Glucose-Capillary: 115 mg/dL — ABNORMAL HIGH (ref 70–99)
Glucose-Capillary: 176 mg/dL — ABNORMAL HIGH (ref 70–99)
Glucose-Capillary: 248 mg/dL — ABNORMAL HIGH (ref 70–99)
Glucose-Capillary: 210 mg/dL — ABNORMAL HIGH (ref 70–99)

## 2019-06-10 LAB — RENAL FUNCTION PANEL
Albumin: 2.5 g/dL — ABNORMAL LOW (ref 3.5–5.0)
Anion gap: 9 (ref 5–15)
BUN: 52 mg/dL — ABNORMAL HIGH (ref 8–23)
CO2: 29 mmol/L (ref 22–32)
Calcium: 9.7 mg/dL (ref 8.9–10.3)
Chloride: 106 mmol/L (ref 98–111)
Creatinine, Ser: 1.41 mg/dL — ABNORMAL HIGH (ref 0.61–1.24)
GFR calc Af Amer: 54 mL/min — ABNORMAL LOW (ref >60)
GFR calc non Af Amer: 46 mL/min — ABNORMAL LOW (ref >60)
Glucose, Bld: 133 mg/dL — ABNORMAL HIGH (ref 70–99)
Phosphorus: 2.5 mg/dL (ref 2.5–4.6)
Potassium: 4.5 mmol/L (ref 3.5–5.1)
Sodium: 144 mmol/L (ref 135–145)

## 2019-06-10 LAB — CBC WITH DIFFERENTIAL/PLATELET
Abs Immature Granulocytes: 0.04 10*3/uL (ref 0.00–0.07)
Basophils Absolute: 0 10*3/uL (ref 0.0–0.1)
Basophils Relative: 0 %
Eosinophils Absolute: 0 10*3/uL (ref 0.0–0.5)
Eosinophils Relative: 0 %
HCT: 37.8 % — ABNORMAL LOW (ref 39.0–52.0)
Hemoglobin: 11.7 g/dL — ABNORMAL LOW (ref 13.0–17.0)
Immature Granulocytes: 1 %
Lymphocytes Relative: 19 %
Lymphs Abs: 1.3 10*3/uL (ref 0.7–4.0)
MCH: 27.7 pg (ref 26.0–34.0)
MCHC: 31 g/dL (ref 30.0–36.0)
MCV: 89.4 fL (ref 80.0–100.0)
Monocytes Absolute: 0.6 10*3/uL (ref 0.1–1.0)
Monocytes Relative: 8 %
Neutro Abs: 5.2 10*3/uL (ref 1.7–7.7)
Neutrophils Relative %: 72 %
Platelets: 377 10*3/uL (ref 150–400)
RBC: 4.23 MIL/uL (ref 4.22–5.81)
RDW: 13.4 % (ref 11.5–15.5)
WBC: 7.1 10*3/uL (ref 4.0–10.5)
nRBC: 0 % (ref 0.0–0.2)

## 2019-06-10 LAB — MAGNESIUM: Magnesium: 1.7 mg/dL (ref 1.7–2.4)

## 2019-06-10 MED ORDER — SODIUM CHLORIDE 0.9 % IV SOLN
Freq: Once | INTRAVENOUS | Status: AC
  Administered 2019-06-10: 17:00:00 via INTRAVENOUS

## 2019-06-10 MED ORDER — PREDNISONE 20 MG PO TABS
50.0000 mg | ORAL_TABLET | Freq: Every day | ORAL | Status: DC
  Administered 2019-06-10 – 2019-06-11 (×2): 50 mg via ORAL
  Filled 2019-06-10 (×2): qty 2

## 2019-06-10 MED ORDER — LEVALBUTEROL HCL 0.63 MG/3ML IN NEBU: 0.6300 mg | INHALATION_SOLUTION | Freq: Four times a day (QID) | RESPIRATORY_TRACT | Status: DC | PRN

## 2019-06-10 MED ORDER — IPRATROPIUM BROMIDE 0.02 % IN SOLN: 0.5000 mg | Freq: Four times a day (QID) | RESPIRATORY_TRACT | Status: DC | PRN

## 2019-06-10 NOTE — Progress Notes (Signed)
[name] Kitchen  PROGRESS NOTE    Matthew Nash  GOT:[id number] DOB: 1936-06-03 DOA: 06/06/2019 PCP: Darreld Mclean, MD   Brief Narrative:   Matthew Nash a 83 y.o.malewith medical history significant ofhypertension, hyperlipidemia, diabetes mellitus, GERD, anxiety, BPH, HBV, PVD, OSA on CPAP, who presents with shortness of breath.  Patient states that he has been having shortness of breath in the past several days, which has been progressively getting worse. He has cough with little mucus production. He has intermittent runny nose, but no sore throat. Denies fever or chills. No chest pain. Patient was found to have oxygen desaturation to 77% on room air, and temporarily required BiPAP in ED, currently on 6 L nasal cannula oxygen with oxygen saturation 95%. Patient denies nausea,vomiting, diarrhea, abdominal pain, symptoms of UTI or unilateral weakness. Patient was found to have elevated d-dimer 3.68, 1 dose of Lovenox 75 mg was given in ED.   Assessment & Plan:   Principal Problem:   Acute respiratory failure with hypoxia (HCC) Active Problems:   Diabetes mellitus type II, controlled ([place])   Gastroesophageal reflux disease without esophagitis   Hyperlipidemia LDL goal <100   OSA on CPAP   HTN (hypertension)   AKI (acute kidney injury) (HCC)   Elevated troponin   Acute respiratory failure with hypoxia - Etiology is not clear. Abbott and Cepheid [id number] tests are negative.  - Chest x-ray showed a bilateral atelectasis; CT chest w/o constrast shows the same - No fever or leukocytosis, low suspicions for pneumonia.  - Patient has elevated d-dimer 3.68, VQ scan negative (unable to do CTA d/t renal function); Patient was given 1 dose of Lovenox 75 mg at 7:00 PM.  - TTE EF 65% - placed on BiPap d/t hypercapneia (uses CPAP at night d/t OSA) - Mucinex for cough  - Atrovent inhaler, prn albuterol or Xopenex inhaler; solumedrol - Follow up blood  culture x2, sputum culture - respiratory virus panel negative - BLE doppler to r/o DVT negative     - wean solumedrol down to qday today and if hold stable, will start a prednisone taper tomorrow     - still requiring O2 supplementation (he does NOT use O2 at home); wean as able  Diabetes mellitus type II, controlled - A1c is 6.8 - Patient is taking metformin at home - SSI; carb modified diet  Gastroesophageal reflux disease without esophagitis - Protonix  Hyperlipidemia LDL goal <100: - zocor  OSA - on CPAP at home - cpap at night  HTN:  - Hold lisinopril due to AKI - amlodipine 5 mg daily - IV hydralazine prn  AKI on CKD2 - Likely due to dehydration and continuation of lisinopril - IV fluid: Patient received 1L of NS in ED - hold lisinopril     - we'll give him a little more fluid today; his SCr has settled in at 1.4 which is still off his baseline  Elevated troponin - Troponin 19, 21, 20.  - No CP.  - Most likely due to demand ischemia secondary to hypoxia and stress.  Chronic urinary retention - continue I&O caths - on chronic keflex 500mg  qday, continue  Chronic constipation     - miralax, metamucil     - c/o of being bloated/worse constipation; check KUB, add senakot     - KUB was negative; denies complaints this AM  Debility/Gait instability     - PT consult  Was still on 3L this AM when I examined. Decreased to 0.5L [place].  Watch sats. Lung exam is good. C/o weakness with walking. No focality on exam. Will ask PT to see. Now on prednisone (start taper if he remains stable through afternoon). Hopefully d/c to home this afternoon or tomorrow.   DVT prophylaxis:lovenox Code Status:FULL Disposition Plan:TBD  Antimicrobials:  . Keflex 500mg  qday    Subjective: "My legs feel weak."  Objective: Vitals:   06/09/19 2015 06/09/19 2321 06/10/19 0300 06/10/19 0807   BP:  (!) 112/57 122/64 118/72  Pulse:  70 65 69  Resp:  (!) 21 (!) 26 19  Temp:  97.8 F (36.6 C) 97.8 F (36.6 C) 97.9 F (36.6 C)  TempSrc:  Axillary Axillary Oral  SpO2: 96% 100% 95% 98%  Weight:   102.5 kg   Height:        Intake/Output Summary (Last 24 hours) at 06/10/2019 1202 Last data filed at 06/09/2019 2240 Gross per 24 hour  Intake -  Output 300 ml  Net -300 ml   Filed Weights   06/07/19 0458 06/09/19 0300 06/10/19 0300  Weight: 102.6 kg 102.5 kg 102.5 kg    Examination:  General:83 y.o.maleresting in bed in NAD Cardiovascular: RRR, +S1, S2, no m/g/r, equal pulses throughout Respiratory:clear this AM, however, still a little breathless w/ speech GI: BS+, NDNT, no masses noted, no organomegaly noted MSK: No e/c/c Skin: No rashes, bruises, ulcerations noted Neuro: A&O x 3, no focal deficits    Data Reviewed: I have personally reviewed following labs and imaging studies.  CBC: Recent Labs  Lab 06/06/19 1620 06/08/19 0523 06/09/19 0732 06/10/19 0622  WBC 9.3 5.9 10.7* 7.1  NEUTROABS 7.5  --  10.0* 5.2  HGB 11.9* 12.4* 12.5* 11.7*  HCT 39.3 41.1 40.8 37.8*  MCV 92.5 91.3 89.5 89.4  PLT 282 310 387 099   Basic Metabolic Panel: Recent Labs  Lab 06/06/19 1620 06/07/19 0620 06/08/19 0523 06/09/19 0732 06/10/19 0622  NA 139  --  140 141 144  K 4.6  --  5.1 4.8 4.5  CL 103  --  99 99 106  CO2 27  --  29 33* 29  GLUCOSE 140*  --  182* 211* 133*  BUN 39*  --  31* 42* 52*  CREATININE 2.12* 1.79* 1.58* 1.39* 1.41*  CALCIUM 9.2  --  9.6 9.9 9.7  MG 2.0  --   --  2.0 1.7  PHOS  --   --   --  2.2* 2.5   GFR: Estimated Creatinine Clearance: 49.3 mL/min (A) (by C-G formula based on SCr of 1.41 mg/dL (H)). Liver Function Tests: Recent Labs  Lab 06/06/19 1620 06/09/19 0732 06/10/19 0622  AST 14*  --   --   ALT 20  --   --   ALKPHOS 41  --   --   BILITOT 0.4  --   --   PROT 6.6  --   --   ALBUMIN 3.2* 2.6* 2.5*   Recent Labs  Lab 06/06/19  1620  LIPASE 43   No results for input(s): AMMONIA in the last 168 hours. Coagulation Profile: No results for input(s): INR, PROTIME in the last 168 hours. Cardiac Enzymes: No results for input(s): CKTOTAL, CKMB, CKMBINDEX, TROPONINI in the last 168 hours. BNP (last 3 results) No results for input(s): PROBNP in the last 8760 hours. HbA1C: Recent Labs    06/08/19 0523  HGBA1C 6.8*   CBG: Recent Labs  Lab 06/09/19 1158 06/09/19 1636 06/09/19 2116 06/10/19 0733 06/10/19 1158  GLUCAP  186* 198* 165* 115* 176*   Lipid Profile: Recent Labs    06/08/19 0523  CHOL 130  HDL 41  LDLCALC 71  TRIG 89  CHOLHDL 3.2   Thyroid Function Tests: No results for input(s): TSH, T4TOTAL, FREET4, T3FREE, THYROIDAB in the last 72 hours. Anemia Panel: No results for input(s): VITAMINB12, FOLATE, FERRITIN, TIBC, IRON, RETICCTPCT in the last 72 hours. Sepsis Labs: Recent Labs  Lab 06/07/19 1553  PROCALCITON <0.10  LATICACIDVEN 0.6    Recent Results (from the past 240 hour(s))  SARS Coronavirus 2 (Hosp order,Performed in [facility] lab via Abbott ID)     Status: None   Collection Time: 06/06/19  4:20 PM   Specimen: Dry Nasal Swab (Abbott ID Now)  Result Value Ref Range Status   SARS Coronavirus 2 (Abbott ID Now) NEGATIVE NEGATIVE Final    Comment: (NOTE) Interpretive Result Comment(s): COVID 19 Positive SARS CoV 2 target nucleic acids are DETECTED. The SARS CoV 2 RNA is generally detectable in upper and lower respiratory specimens during the acute phase of infection.  Positive results are indicative of active infection with SARS CoV 2.  Clinical correlation with patient history and other diagnostic information is necessary to determine patient infection status.  Positive results do not rule out bacterial infection or coinfection with other viruses. The expected result is Negative. COVID 19 Negative SARS CoV 2 target nucleic acids are NOT DETECTED. The SARS CoV 2 RNA is  generally detectable in upper and lower respiratory specimens during the acute phase of infection.  Negative results do not preclude SARS CoV 2 infection, do not rule out coinfections with other pathogens, and should not be used as the sole basis for treatment or other patient management decisions.  Negative results must be combined with clinical  observations, patient history, and epidemiological information. The expected result is Negative. Invalid Presence or absence of SARS CoV 2 nucleic acids cannot be determined. Repeat testing was performed on the submitted specimen and repeated Invalid results were obtained.  If clinically indicated, additional testing on a new specimen with an alternate test methodology [phone]) is advised.  The SARS CoV 2 RNA is generally detectable in upper and lower respiratory specimens during the acute phase of infection. The expected result is Negative. Fact Sheet for Patients:  GolfingFamily.no Fact Sheet for Healthcare Providers: https://www.hernandez-brewer.com/ This test is not yet approved or cleared by the [place] FDA and has been authorized for detection and/or diagnosis of SARS CoV 2 by FDA under an Emergency Use Authorization (EUA).  This EUA will remain in effect (meaning this test can be used) for the duration of the COVID19 d eclaration under Section 564(b)(1) of the Act, 21 U.S.C. section 8308031113 3(b)(1), unless the authorization is terminated or revoked sooner. Performed at [facility], [address] [place], [place] [address]   Blood culture (routine x 2)     Status: None (Preliminary result)   Collection Time: 06/06/19  4:34 PM   Specimen: BLOOD LEFT HAND  Result Value Ref Range Status   Specimen Description   Final    BLOOD LEFT HAND Performed at [facility], [place] [address], [place], [place] [address]    Special Requests   Final    BOTTLES DRAWN AEROBIC ONLY Blood  Culture results may not be optimal due to an inadequate volume of blood received in culture bottles Performed at [facility], [address], [place], [place] [address]  Culture   Final    NO GROWTH 4 DAYS Performed at [facility], [place] [address], [place], [place] [address]    Report Status PENDING  Incomplete  Urine culture     Status: None   Collection Time: 06/06/19  4:40 PM   Specimen: Urine, Random  Result Value Ref Range Status   Specimen Description   Final    URINE, RANDOM Performed at [facility], [address] [place], [place] [address]    Special Requests   Final    NONE Performed at [facility], [address] [place], [place] [address]    Culture   Final    NO GROWTH Performed at [facility], [place] [address], [place], [place] [address]    Report Status 06/07/2019 FINAL  Final  MRSA PCR Screening     Status: None   Collection Time: 06/07/19  5:03 AM  Result Value Ref Range Status   MRSA by PCR NEGATIVE NEGATIVE Final    Comment:        The GeneXpert MRSA Assay (FDA approved for NASAL specimens only), is one component of a comprehensive MRSA colonization surveillance program. It is not intended to diagnose MRSA infection nor to guide or monitor treatment for MRSA infections. Performed at [facility], [place] [address] [place], [place] [address]   SARS Coronavirus 2 (CEPHEID - Performed in [place] hospital lab), Hosp Order     Status: None   Collection Time: 06/07/19  5:18 AM   Specimen: Nasopharyngeal Swab  Result Value Ref Range Status   SARS Coronavirus 2 NEGATIVE NEGATIVE Final    Comment: (NOTE) If result is NEGATIVE SARS-CoV-2 target nucleic acids are NOT DETECTED. The SARS-CoV-2 RNA is generally detectable in upper and lower  respiratory specimens during the acute phase of infection. The lowest  concentration of SARS-CoV-2 viral copies this assay can detect is 250  copies / mL. A  negative result does not preclude SARS-CoV-2 infection  and should not be used as the sole basis for treatment or other  patient management decisions.  A negative result may occur with  improper specimen collection / handling, submission of specimen other  than nasopharyngeal swab, presence of viral mutation(s) within the  areas targeted by this assay, and inadequate number of viral copies  (<250 copies / mL). A negative result must be combined with clinical  observations, patient history, and epidemiological information. If result is POSITIVE SARS-CoV-2 target nucleic acids are DETECTED. The SARS-CoV-2 RNA is generally detectable in upper and lower  respiratory specimens dur ing the acute phase of infection.  Positive  results are indicative of active infection with SARS-CoV-2.  Clinical  correlation with patient history and other diagnostic information is  necessary to determine patient infection status.  Positive results do  not rule out bacterial infection or co-infection with other viruses. If result is PRESUMPTIVE POSTIVE SARS-CoV-2 nucleic acids MAY BE PRESENT.   A presumptive positive result was obtained on the submitted specimen  and confirmed on repeat testing.  While 2019 novel coronavirus  (SARS-CoV-2) nucleic acids may be present in the submitted sample  additional confirmatory testing may be necessary for epidemiological  and / or clinical management purposes  to differentiate between  SARS-CoV-2 and other Sarbecovirus currently known to infect humans.  If clinically indicated additional testing with an alternate test  methodology [phone]) is advised. The SARS-CoV-2 RNA is generally  detectable in upper  and lower respiratory sp ecimens during the acute  phase of infection. The expected result is Negative. Fact Sheet for Patients:  StrictlyIdeas.no Fact Sheet for Healthcare Providers: BankingDealers.co.za This test is not  yet approved or cleared by the [place] FDA and has been authorized for detection and/or diagnosis of SARS-CoV-2 by FDA under an Emergency Use Authorization (EUA).  This EUA will remain in effect (meaning this test can be used) for the duration of the COVID-19 declaration under Section 564(b)(1) of the Act, 21 U.S.C. section 360bbb-3(b)(1), unless the authorization is terminated or revoked sooner. Performed at [facility], [place] [address] [place], [place] [address]   Respiratory Panel by PCR     Status: None   Collection Time: 06/07/19  6:06 AM   Specimen: Nasopharyngeal Swab; Respiratory  Result Value Ref Range Status   Adenovirus NOT DETECTED NOT DETECTED Final   Coronavirus 229E NOT DETECTED NOT DETECTED Final    Comment: (NOTE) The Coronavirus on the Respiratory Panel, DOES NOT test for the novel  Coronavirus (2019 nCoV)    Coronavirus HKU1 NOT DETECTED NOT DETECTED Final   Coronavirus NL63 NOT DETECTED NOT DETECTED Final   Coronavirus OC43 NOT DETECTED NOT DETECTED Final   Metapneumovirus NOT DETECTED NOT DETECTED Final   Rhinovirus / Enterovirus NOT DETECTED NOT DETECTED Final   Influenza A NOT DETECTED NOT DETECTED Final   Influenza B NOT DETECTED NOT DETECTED Final   Parainfluenza Virus 1 NOT DETECTED NOT DETECTED Final   Parainfluenza Virus 2 NOT DETECTED NOT DETECTED Final   Parainfluenza Virus 3 NOT DETECTED NOT DETECTED Final   Parainfluenza Virus 4 NOT DETECTED NOT DETECTED Final   Respiratory Syncytial Virus NOT DETECTED NOT DETECTED Final   Bordetella pertussis NOT DETECTED NOT DETECTED Final   Chlamydophila pneumoniae NOT DETECTED NOT DETECTED Final   Mycoplasma pneumoniae NOT DETECTED NOT DETECTED Final    Comment: Performed at [facility], [place]. [address], [place], [place] [address]      Radiology Studies: Dg Abd 1 View  Result Date: 06/09/2019 CLINICAL DATA:  83 year old male with a history constipation EXAM: ABDOMEN - 1 VIEW COMPARISON:   01/15/2019 FINDINGS: Gas within stomach small bowel and colon. No abnormal distention. Formed stool within the sigmoid colon, mild burden. Surgical changes of cholecystectomy. No unexpected radiopaque foreign body soft tissue density or calcification. Degenerative changes of the spine and the hips. No displaced fracture IMPRESSION: Nonobstructive bowel gas pattern with mild stool burden. Electronically Signed   By: Corrie Mckusick D.O.   On: 06/09/2019 14:04   Ct Chest Wo Contrast  Result Date: 06/08/2019 CLINICAL DATA:  Shortness of breath EXAM: CT CHEST WITHOUT CONTRAST TECHNIQUE: Multidetector CT imaging of the chest was performed following the standard protocol without IV contrast. COMPARISON:  Pulmonary V/Q scan, 06/07/2019, CT chest, 11/25/2018 FINDINGS: Cardiovascular: Scattered coronary artery calcifications. Normal heart size. No pericardial effusion. Mediastinum/Nodes: No enlarged mediastinal, hilar, or axillary lymph nodes. Thyroid gland, trachea, and esophagus demonstrate no significant findings. Lungs/Pleura: Minimal paraseptal emphysema. Bibasilar scarring or atelectasis of the lung bases. Upper Abdomen: No acute abnormality. Musculoskeletal: No chest wall mass or suspicious bone lesions identified. IMPRESSION: 1. Minimal paraseptal emphysema. Bibasilar scarring or atelectasis of the lung bases. No acute appearing airspace opacity. 2.  Scattered coronary artery calcifications. Electronically Signed   By: Eddie Candle M.D.   On: 06/08/2019 14:24    Scheduled Meds: . amLODipine  10 mg Oral Daily  . aspirin EC  81 mg  Oral Daily  . calcium-vitamin D  1 tablet Oral BID  . cephALEXin  500 mg Oral BID  . clonazePAM  0.25 mg Oral QHS  . enoxaparin (LOVENOX) injection  40 mg Subcutaneous Q24H  . fenofibrate  160 mg Oral Daily  . insulin aspart  0-5 Units Subcutaneous QHS  . insulin aspart  0-9 Units Subcutaneous TID WC  . Melatonin  9 mg Oral QHS  . multivitamin with minerals  1 tablet Oral  Daily  . pantoprazole  40 mg Oral Daily  . polycarbophil  625 mg Oral BID  . predniSONE  50 mg Oral Q breakfast  . saccharomyces boulardii  250 mg Oral Daily  . senna-docusate  1 tablet Oral QHS  . simvastatin  10 mg Oral QHS  . vitamin C  500 mg Oral Daily   Continuous Infusions: . dextrose 30 mL/hr at 06/07/19 2320     LOS: 3 days    Time spent: 25 minutes spent in the coordination of care today.    Jonnie Finner, DO [facility] Pager [phone]  If 7PM-7AM, please contact night-coverage [email or web address] Password [facility] 06/10/2019, 12:02 PM

## 2019-06-10 NOTE — Care Management Important Message (Signed)
Important Message  Patient Details  Name: Matthew Nash MRN: [id number] Date of Birth: May 19, 1936   Medicare Important Message Given:  Yes     [name] 06/10/2019, 1:24 PM

## 2019-06-11 LAB — RENAL FUNCTION PANEL
Albumin: 2.6 g/dL — ABNORMAL LOW (ref 3.5–5.0)
Anion gap: 6 (ref 5–15)
BUN: 37 mg/dL — ABNORMAL HIGH (ref 8–23)
CO2: 29 mmol/L (ref 22–32)
Calcium: 9.3 mg/dL (ref 8.9–10.3)
Chloride: 106 mmol/L (ref 98–111)
Creatinine, Ser: 1.34 mg/dL — ABNORMAL HIGH (ref 0.61–1.24)
GFR calc Af Amer: 57 mL/min — ABNORMAL LOW (ref >60)
GFR calc non Af Amer: 49 mL/min — ABNORMAL LOW (ref >60)
Glucose, Bld: 163 mg/dL — ABNORMAL HIGH (ref 70–99)
Phosphorus: 2.5 mg/dL (ref 2.5–4.6)
Potassium: 4.5 mmol/L (ref 3.5–5.1)
Sodium: 141 mmol/L (ref 135–145)

## 2019-06-11 LAB — CBC WITH DIFFERENTIAL/PLATELET
Abs Immature Granulocytes: 0.05 10*3/uL (ref 0.00–0.07)
Basophils Absolute: 0 10*3/uL (ref 0.0–0.1)
Basophils Relative: 0 %
Eosinophils Absolute: 0 10*3/uL (ref 0.0–0.5)
Eosinophils Relative: 0 %
HCT: 39.2 % (ref 39.0–52.0)
Hemoglobin: 12.4 g/dL — ABNORMAL LOW (ref 13.0–17.0)
Immature Granulocytes: 1 %
Lymphocytes Relative: 11 %
Lymphs Abs: 0.9 10*3/uL (ref 0.7–4.0)
MCH: 28.1 pg (ref 26.0–34.0)
MCHC: 31.6 g/dL (ref 30.0–36.0)
MCV: 88.9 fL (ref 80.0–100.0)
Monocytes Absolute: 0.7 10*3/uL (ref 0.1–1.0)
Monocytes Relative: 8 %
Neutro Abs: 7.2 10*3/uL (ref 1.7–7.7)
Neutrophils Relative %: 80 %
Platelets: 380 10*3/uL (ref 150–400)
RBC: 4.41 MIL/uL (ref 4.22–5.81)
RDW: 13.4 % (ref 11.5–15.5)
WBC: 8.9 10*3/uL (ref 4.0–10.5)
nRBC: 0 % (ref 0.0–0.2)

## 2019-06-11 LAB — GLUCOSE, CAPILLARY
Glucose-Capillary: 119 mg/dL — ABNORMAL HIGH (ref 70–99)
Glucose-Capillary: 121 mg/dL — ABNORMAL HIGH (ref 70–99)

## 2019-06-11 LAB — CULTURE, BLOOD (ROUTINE X 2): Culture: NO GROWTH

## 2019-06-11 LAB — MAGNESIUM: Magnesium: 1.5 mg/dL — ABNORMAL LOW (ref 1.7–2.4)

## 2019-06-11 MED ORDER — AMLODIPINE BESYLATE 10 MG PO TABS: 10.0000 mg | ORAL_TABLET | Freq: Every day | ORAL | 0 refills | Status: DC

## 2019-06-11 MED ORDER — MAGNESIUM OXIDE 400 (241.3 MG) MG PO TABS
400.0000 mg | ORAL_TABLET | Freq: Two times a day (BID) | ORAL | Status: DC
  Administered 2019-06-11: 400 mg via ORAL
  Filled 2019-06-11: qty 1

## 2019-06-11 MED ORDER — PREDNISONE 10 MG PO TABS: ORAL_TABLET | ORAL | 0 refills | Status: AC

## 2019-06-11 NOTE — Discharge Summary (Signed)
. Physician Discharge Summary  Matthew Nash WPY:[id number] DOB: 1936-08-29 DOA: 06/06/2019  PCP: Darreld Mclean, MD  Admit date: 06/06/2019 Discharge date: 06/11/2019  Admitted From: Home Disposition:  Discharged to home with HHPT.  Recommendations for Outpatient Follow-up:  1. Follow up with PCP in 1-2 weeks 2. Please obtain BMP/CBC in one week  Equipment/Devices: Rolling Walker   Discharge Condition: Stable  CODE STATUS: FULL   Brief/Interim Summary: Matthew Nash a 83 y.o.malewith medical history significant ofhypertension, hyperlipidemia, diabetes mellitus, GERD, anxiety, BPH, HBV, PVD, OSA on CPAP, who presents with shortness of breath.  Patient states that he has been having shortness of breath in the past several days, which has been progressively getting worse. He has cough with little mucus production. He has intermittent runny nose, but no sore throat. Denies fever or chills. No chest pain. Patient was found to have oxygen desaturation to 77% on room air, and temporarily required BiPAP in ED, currently on 6 L nasal cannula oxygen with oxygen saturation 95%. Patient denies nausea,vomiting, diarrhea, abdominal pain, symptoms of UTI or unilateral weakness. Patient was found to have elevated d-dimer 3.68, 1 dose of Lovenox 75 mg was given in ED.  Discharge Diagnoses:  Principal Problem:   Acute respiratory failure with hypoxia (HCC) Active Problems:   Diabetes mellitus type II, controlled ([place])   Gastroesophageal reflux disease without esophagitis   Hyperlipidemia LDL goal <100   OSA on CPAP   HTN (hypertension)   AKI (acute kidney injury) (HCC)   Elevated troponin  Acute respiratory failure with hypoxia - Etiology is not clear. Abbott and Cepheid [id number] tests are negative.  - Chest x-ray showed a bilateral atelectasis; CT chest w/o constrast shows the same - No fever or leukocytosis, low suspicions for pneumonia.  - Patient has  elevated d-dimer 3.68, VQ scan negative (unable to do CTA d/t renal function); Patient was given 1 dose of Lovenox 75 mg at 7:00 PM.  - TTE EF 65% - placed on BiPap d/t hypercapneia (uses CPAP at night d/t OSA) - Mucinex for cough  - Atrovent inhaler, prn albuterol or Xopenex inhaler; solumedrol - Follow up blood culture x2, sputum culture - respiratory virus panel negative - BLE doppler to r/o DVT negative - wean solumedrol down to qday today and if hold stable, will start a prednisone taper tomorrow - stable on RA, continue steroid taper  Diabetes mellitus type II, controlled - A1c is 6.8 - Patient is taking metformin at home - SSI; carb modified diet  Gastroesophageal reflux disease without esophagitis - Protonix  Hyperlipidemia LDL goal <100: - zocor  OSA - on CPAP at home - cpap at night  HTN:  - Hold lisinopril due to AKI - amlodipine 5 mg daily - IV hydralazine prn  AKIon CKD2 - Likely due to dehydration and continuation of lisinopril - IV fluid: Patient received 1L of NS in ED - hold lisinopril     - we'll give him a little more fluid today; his SCr has settled in at 1.4 which is still off his baseline  Elevated troponin - Troponin 19, 21, 20.  - No CP.  - Most likely due to demand ischemia secondary to hypoxia and stress.  Chronic urinary retention - continue I&O caths - on chronic keflex 500mg  qday, continue  Chronic constipation - miralax, metamucil - c/o of being bloated/worse constipation; check KUB, add senakot     - KUB was negative; denies complaints this AM  Debility/Gait instability     -  PT consult     - PT recs HHPT, RW  Discharge Instructions   Allergies as of 06/11/2019      Reactions   Sulfamethoxazole-trimethoprim Diarrhea   Severe diarrhea   Albamycin [novobiocin] Itching, Rash   Penicillins Rash, Other  (See Comments)   Has patient had a PCN reaction causing immediate rash, facial/tongue/throat swelling, SOB or lightheadedness with hypotension:  no Has patient had a PCN reaction causing severe rash involving mucus membranes or skin necrosis: no Has patient had a PCN reaction that required hospitalization no Has patient had a PCN reaction occurring within the last 10 years: no - childhood reaction If all of the above answers are "NO", then may proceed with Cephalosporin use.      Medication List    STOP taking these medications   lisinopril 5 MG tablet Commonly known as: ZESTRIL     TAKE these medications   amLODipine 10 MG tablet Commonly known as: NORVASC Take 1 tablet (10 mg total) by mouth daily. Start taking on: June 12, 2019   aspirin EC 81 MG tablet Take 81 mg by mouth daily.   CALCIUM-MAGNESIUM-VITAMIN D PO Take 2 tablets by mouth 2 (two) times daily.   cephALEXin 500 MG capsule Commonly known as: KEFLEX Take 500 mg by mouth daily.   clonazePAM 0.5 MG tablet Commonly known as: KLONOPIN Take 1 tablet (0.5 mg total) by mouth at bedtime. What changed: how much to take   FIBER PO Take 3 capsules by mouth 2 (two) times daily.   Melatonin 10 MG Tabs Take 1 tablet by mouth at bedtime.   metFORMIN 500 MG tablet Commonly known as: GLUCOPHAGE Take 500 mg by mouth 2 (two) times daily with a meal.   MIRALAX PO Take 17 g by mouth daily.   Multi-Vitamins Tabs Take 1 tablet by mouth daily.   omeprazole 20 MG capsule Commonly known as: PRILOSEC TAKE 1 CAPSULE DAILY What changed: when to take this   predniSONE 10 MG tablet Commonly known as: DELTASONE Take 4 tablets (40 mg total) by mouth daily for 1 day, THEN 3 tablets (30 mg total) daily for 1 day, THEN 2 tablets (20 mg total) daily for 1 day, THEN 1 tablet (10 mg total) daily for 1 day. Start taking on: June 11, 2019   PROBIOTIC ADVANCED PO Take 1 tablet by mouth daily.   simvastatin 10 MG tablet Commonly  known as: ZOCOR TAKE 1 TABLET AT BEDTIME   Tricor 145 MG tablet Generic drug: fenofibrate TAKE 1 TABLET EVERY EVENING   vitamin C 500 MG tablet Commonly known as: ASCORBIC ACID Take 500 mg by mouth daily.            Durable Medical Equipment  (From admission, onward)         Start     Ordered   06/11/19 1307  For home use only DME Walker rolling  Once    Question:  Patient needs a walker to treat with the following condition  Answer:  Gait instability   06/11/19 1307          Allergies  Allergen Reactions  . Sulfamethoxazole-Trimethoprim Diarrhea    Severe diarrhea  . Albamycin [Novobiocin] Itching and Rash  . Penicillins Rash and Other (See Comments)    Has patient had a PCN reaction causing immediate rash, facial/tongue/throat swelling, SOB or lightheadedness with hypotension:  no Has patient had a PCN reaction causing severe rash involving mucus membranes or skin necrosis: no Has patient  had a PCN reaction that required hospitalization no Has patient had a PCN reaction occurring within the last 10 years: no - childhood reaction If all of the above answers are "NO", then may proceed with Cephalosporin use.      Procedures/Studies: Dg Abd 1 View  Result Date: 06/09/2019 CLINICAL DATA:  83 year old male with a history constipation EXAM: ABDOMEN - 1 VIEW COMPARISON:  01/15/2019 FINDINGS: Gas within stomach small bowel and colon. No abnormal distention. Formed stool within the sigmoid colon, mild burden. Surgical changes of cholecystectomy. No unexpected radiopaque foreign body soft tissue density or calcification. Degenerative changes of the spine and the hips. No displaced fracture IMPRESSION: Nonobstructive bowel gas pattern with mild stool burden. Electronically Signed   By: Corrie Mckusick D.O.   On: 06/09/2019 14:04   Ct Chest Wo Contrast  Result Date: 06/08/2019 CLINICAL DATA:  Shortness of breath EXAM: CT CHEST WITHOUT CONTRAST TECHNIQUE: Multidetector CT  imaging of the chest was performed following the standard protocol without IV contrast. COMPARISON:  Pulmonary V/Q scan, 06/07/2019, CT chest, 11/25/2018 FINDINGS: Cardiovascular: Scattered coronary artery calcifications. Normal heart size. No pericardial effusion. Mediastinum/Nodes: No enlarged mediastinal, hilar, or axillary lymph nodes. Thyroid gland, trachea, and esophagus demonstrate no significant findings. Lungs/Pleura: Minimal paraseptal emphysema. Bibasilar scarring or atelectasis of the lung bases. Upper Abdomen: No acute abnormality. Musculoskeletal: No chest wall mass or suspicious bone lesions identified. IMPRESSION: 1. Minimal paraseptal emphysema. Bibasilar scarring or atelectasis of the lung bases. No acute appearing airspace opacity. 2.  Scattered coronary artery calcifications. Electronically Signed   By: Eddie Candle M.D.   On: 06/08/2019 14:24   Nm Pulmonary Perfusion  Result Date: 06/07/2019 CLINICAL DATA:  Chest pain.  Concern for pulmonary embolism. EXAM: NUCLEAR MEDICINE PERFUSION LUNG SCAN TECHNIQUE: Perfusion images were obtained in multiple projections after intravenous injection of radiopharmaceutical. RADIOPHARMACEUTICALS:  1.7 mCi Tc-30m MAA COMPARISON:  Radiograph 06/06/2019 FINDINGS: No wedge-shaped peripheral perfusion defects within the LEFT or RIGHT lung to localize acute pulmonary embolism. Decreased regional ventilation to the LEFT lung compared to the RIGHT. IMPRESSION: No evidence of acute pulmonary embolism. Electronically Signed   By: Suzy Bouchard M.D.   On: 06/07/2019 14:18   Dg Chest Portable 1 View  Result Date: 06/06/2019 CLINICAL DATA:  Shortness of breath, fall, LEFT flank pain, hypertension EXAM: PORTABLE CHEST 1 VIEW COMPARISON:  Portable exam at 1647 hrs compared to 11/25/2018 FINDINGS: Normal heart size, mediastinal contours, and pulmonary vascularity. Bibasilar atelectasis. Upper lungs clear. No pleural effusion or pneumothorax. No acute osseous  findings. IMPRESSION: Bibasilar atelectasis. Electronically Signed   By: Lavonia Dana M.D.   On: 06/06/2019 17:52   Vas [place] Lower Extremity Venous (dvt)  Result Date: 06/07/2019  Lower Venous Study Indications: Positive d dimer.  Limitations: Patient positioning. Performing Technologist: Abram Sander RVS  Examination Guidelines: A complete evaluation includes B-mode imaging, spectral Doppler, color Doppler, and power Doppler as needed of all accessible portions of each vessel. Bilateral testing is considered an integral part of a complete examination. Limited examinations for reoccurring indications may be performed as noted.  +---------+---------------+---------+-----------+----------+--------------+ RIGHT    CompressibilityPhasicitySpontaneityPropertiesSummary        +---------+---------------+---------+-----------+----------+--------------+ CFV      Full           Yes      Yes                                 +---------+---------------+---------+-----------+----------+--------------+  SFJ      Full                                                        +---------+---------------+---------+-----------+----------+--------------+ FV Prox  Full                                                        +---------+---------------+---------+-----------+----------+--------------+ FV Mid   Full                                                        +---------+---------------+---------+-----------+----------+--------------+ FV DistalFull                                                        +---------+---------------+---------+-----------+----------+--------------+ PFV      Full                                                        +---------+---------------+---------+-----------+----------+--------------+ POP      Full           Yes      Yes                                 +---------+---------------+---------+-----------+----------+--------------+ PTV      Full                                                         +---------+---------------+---------+-----------+----------+--------------+ PERO                                                  Not visualized +---------+---------------+---------+-----------+----------+--------------+   +---------+---------------+---------+-----------+----------+--------------+ LEFT     CompressibilityPhasicitySpontaneityPropertiesSummary        +---------+---------------+---------+-----------+----------+--------------+ CFV      Full           Yes      Yes                                 +---------+---------------+---------+-----------+----------+--------------+ SFJ      Full                                                        +---------+---------------+---------+-----------+----------+--------------+  FV Prox  Full                                                        +---------+---------------+---------+-----------+----------+--------------+ FV Mid   Full                                                        +---------+---------------+---------+-----------+----------+--------------+ FV DistalFull                                                        +---------+---------------+---------+-----------+----------+--------------+ PFV      Full                                                        +---------+---------------+---------+-----------+----------+--------------+ POP      Full           Yes      Yes                                 +---------+---------------+---------+-----------+----------+--------------+ PTV      Full                                                        +---------+---------------+---------+-----------+----------+--------------+ PERO                                                  Not visualized +---------+---------------+---------+-----------+----------+--------------+     Summary: Right: There is no evidence of deep vein thrombosis in  the lower extremity. No cystic structure found in the popliteal fossa. Left: There is no evidence of deep vein thrombosis in the lower extremity. No cystic structure found in the popliteal fossa.  *See table(s) above for measurements and observations. Electronically signed by Curt Jews MD on 06/07/2019 at 3:52:23 PM.    Final       Subjective: "I feel good."  Discharge Exam: Vitals:   06/10/19 2311 06/11/19 0851  BP: 114/60 118/82  Pulse: 76 74  Resp:  16  Temp: 97.7 F (36.5 C) 97.7 F (36.5 C)  SpO2: 94% 100%   Vitals:   06/10/19 1537 06/10/19 1645 06/10/19 2311 06/11/19 0851  BP:  118/67 114/60 118/82  Pulse: 86 78 76 74  Resp:  17  16  Temp:  97.7 F (36.5 C) 97.7 F (36.5 C) 97.7 F (36.5 C)  TempSrc:  Oral Oral Oral  SpO2: 97% 97% 94% 100%  Weight:      Height:  General:83 y.o.maleresting in bed in NAD Cardiovascular: RRR, +S1, S2, no m/g/r, equal pulses throughout Respiratory:CTABL, no w/r/r, normal WOB GI: BS+, NDNT, no masses noted, no organomegaly noted MSK: No e/c/c Skin: No rashes, bruises, ulcerations noted Neuro: A&O x 3, no focal deficits    The results of significant diagnostics from this hospitalization (including imaging, microbiology, ancillary and laboratory) are listed below for reference.     Microbiology: Recent Results (from the past 240 hour(s))  SARS Coronavirus 2 (Hosp order,Performed in [facility] lab via Abbott ID)     Status: None   Collection Time: 06/06/19  4:20 PM   Specimen: Dry Nasal Swab (Abbott ID Now)  Result Value Ref Range Status   SARS Coronavirus 2 (Abbott ID Now) NEGATIVE NEGATIVE Final    Comment: (NOTE) Interpretive Result Comment(s): COVID 19 Positive SARS CoV 2 target nucleic acids are DETECTED. The SARS CoV 2 RNA is generally detectable in upper and lower respiratory specimens during the acute phase of infection.  Positive results are indicative of active infection with SARS CoV 2.   Clinical correlation with patient history and other diagnostic information is necessary to determine patient infection status.  Positive results do not rule out bacterial infection or coinfection with other viruses. The expected result is Negative. COVID 19 Negative SARS CoV 2 target nucleic acids are NOT DETECTED. The SARS CoV 2 RNA is generally detectable in upper and lower respiratory specimens during the acute phase of infection.  Negative results do not preclude SARS CoV 2 infection, do not rule out coinfections with other pathogens, and should not be used as the sole basis for treatment or other patient management decisions.  Negative results must be combined with clinical  observations, patient history, and epidemiological information. The expected result is Negative. Invalid Presence or absence of SARS CoV 2 nucleic acids cannot be determined. Repeat testing was performed on the submitted specimen and repeated Invalid results were obtained.  If clinically indicated, additional testing on a new specimen with an alternate test methodology [id number]) is advised.  The SARS CoV 2 RNA is generally detectable in upper and lower respiratory specimens during the acute phase of infection. The expected result is Negative. Fact Sheet for Patients:  GolfingFamily.no Fact Sheet for Healthcare Providers: https://www.hernandez-brewer.com/ This test is not yet approved or cleared by the [place] FDA and has been authorized for detection and/or diagnosis of SARS CoV 2 by FDA under an Emergency Use Authorization (EUA).  This EUA will remain in effect (meaning this test can be used) for the duration of the COVID19 d eclaration under Section 564(b)(1) of the Act, 21 U.S.C. section [phone] 3(b)(1), unless the authorization is terminated or revoked sooner. Performed at [facility], [address] [place], [place] [address]   Blood culture  (routine x 2)     Status: None   Collection Time: 06/06/19  4:34 PM   Specimen: BLOOD LEFT HAND  Result Value Ref Range Status   Specimen Description   Final    BLOOD LEFT HAND Performed at [facility], [place] [address], [place], [place] [address]    Special Requests   Final    BOTTLES DRAWN AEROBIC ONLY Blood Culture results may not be optimal due to an inadequate volume of blood received in culture bottles Performed at [facility], [address] [place], [place] [address]    Culture   Final    NO GROWTH 5 DAYS Performed at [facility], [place] [address], [place], [place] [address]    Report Status 06/11/2019 FINAL  Final  Urine culture     Status: None   Collection Time: 06/06/19  4:40 PM   Specimen: Urine, Random  Result Value Ref Range Status   Specimen Description   Final    URINE, RANDOM Performed at [facility], [address] [place], [place] [address]    Special Requests   Final    NONE Performed at [facility], [address] [place], [place] [address]    Culture   Final    NO GROWTH Performed at [facility], [place] [address], [place], [place] [address]    Report Status 06/07/2019 FINAL  Final  MRSA PCR Screening     Status: None   Collection Time: 06/07/19  5:03 AM  Result Value Ref Range Status   MRSA by PCR NEGATIVE NEGATIVE Final    Comment:        The GeneXpert MRSA Assay (FDA approved for NASAL specimens only), is one component of a comprehensive MRSA colonization surveillance program. It is not intended to diagnose MRSA infection nor to guide or monitor treatment for MRSA infections. Performed at [facility], [place] [address], [place], [place] [address]   SARS Coronavirus 2 (CEPHEID - Performed in [place] hospital lab), Hosp Order     Status: None   Collection Time: 06/07/19  5:18 AM   Specimen: Nasopharyngeal Swab  Result Value Ref Range Status   SARS Coronavirus 2 NEGATIVE NEGATIVE  Final    Comment: (NOTE) If result is NEGATIVE SARS-CoV-2 target nucleic acids are NOT DETECTED. The SARS-CoV-2 RNA is generally detectable in upper and lower  respiratory specimens during the acute phase of infection. The lowest  concentration of SARS-CoV-2 viral copies this assay can detect is 250  copies / mL. A negative result does not preclude SARS-CoV-2 infection  and should not be used as the sole basis for treatment or other  patient management decisions.  A negative result may occur with  improper specimen collection / handling, submission of specimen other  than nasopharyngeal swab, presence of viral mutation(s) within the  areas targeted by this assay, and inadequate number of viral copies  (<250 copies / mL). A negative result must be combined with clinical  observations, patient history, and epidemiological information. If result is POSITIVE SARS-CoV-2 target nucleic acids are DETECTED. The SARS-CoV-2 RNA is generally detectable in upper and lower  respiratory specimens dur ing the acute phase of infection.  Positive  results are indicative of active infection with SARS-CoV-2.  Clinical  correlation with patient history and other diagnostic information is  necessary to determine patient infection status.  Positive results do  not rule out bacterial infection or co-infection with other viruses. If result is PRESUMPTIVE POSTIVE SARS-CoV-2 nucleic acids MAY BE PRESENT.   A presumptive positive result was obtained on the submitted specimen  and confirmed on repeat testing.  While 2019 novel coronavirus  (SARS-CoV-2) nucleic acids may be present in the submitted sample  additional confirmatory testing may be necessary for epidemiological  and / or clinical management purposes  to differentiate between  SARS-CoV-2 and other Sarbecovirus currently known to infect humans.  If clinically indicated additional testing with an alternate test  methodology [phone]) is advised. The  SARS-CoV-2 RNA is generally  detectable in upper and lower respiratory sp ecimens during the acute  phase of infection.  The expected result is Negative. Fact Sheet for Patients:  StrictlyIdeas.no Fact Sheet for Healthcare Providers: BankingDealers.co.za This test is not yet approved or cleared by the [place] FDA and has been authorized for detection and/or diagnosis of SARS-CoV-2 by FDA under an Emergency Use Authorization (EUA).  This EUA will remain in effect (meaning this test can be used) for the duration of the COVID-19 declaration under Section 564(b)(1) of the Act, 21 U.S.C. section 360bbb-3(b)(1), unless the authorization is terminated or revoked sooner. Performed at [facility], [place] [address] [place], [place] [address]   Respiratory Panel by PCR     Status: None   Collection Time: 06/07/19  6:06 AM   Specimen: Nasopharyngeal Swab; Respiratory  Result Value Ref Range Status   Adenovirus NOT DETECTED NOT DETECTED Final   Coronavirus 229E NOT DETECTED NOT DETECTED Final    Comment: (NOTE) The Coronavirus on the Respiratory Panel, DOES NOT test for the novel  Coronavirus (2019 nCoV)    Coronavirus HKU1 NOT DETECTED NOT DETECTED Final   Coronavirus NL63 NOT DETECTED NOT DETECTED Final   Coronavirus OC43 NOT DETECTED NOT DETECTED Final   Metapneumovirus NOT DETECTED NOT DETECTED Final   Rhinovirus / Enterovirus NOT DETECTED NOT DETECTED Final   Influenza A NOT DETECTED NOT DETECTED Final   Influenza B NOT DETECTED NOT DETECTED Final   Parainfluenza Virus 1 NOT DETECTED NOT DETECTED Final   Parainfluenza Virus 2 NOT DETECTED NOT DETECTED Final   Parainfluenza Virus 3 NOT DETECTED NOT DETECTED Final   Parainfluenza Virus 4 NOT DETECTED NOT DETECTED Final   Respiratory Syncytial Virus NOT DETECTED NOT DETECTED Final   Bordetella pertussis NOT DETECTED NOT DETECTED Final   Chlamydophila pneumoniae NOT DETECTED NOT  DETECTED Final   Mycoplasma pneumoniae NOT DETECTED NOT DETECTED Final    Comment: Performed at [facility], [place]. [address] [place], [place] [address]     Labs: BNP (last 3 results) Recent Labs    06/06/19 1620  BNP 25.3   Basic Metabolic Panel: Recent Labs  Lab 06/06/19 1620 06/07/19 0620 06/08/19 0523 06/09/19 0812 06/10/19 0622 06/11/19 0654  NA 139  --  140 141 144 141  K 4.6  --  5.1 4.8 4.5 4.5  CL 103  --  99 99 106 106  CO2 27  --  29 33* 29 29  GLUCOSE 140*  --  182* 211* 133* 163*  BUN 39*  --  31* 42* 52* 37*  CREATININE 2.12* 1.79* 1.58* 1.39* 1.41* 1.34*  CALCIUM 9.2  --  9.6 9.9 9.7 9.3  MG 2.0  --   --  2.0 1.7 1.5*  PHOS  --   --   --  2.2* 2.5 2.5   Liver Function Tests: Recent Labs  Lab 06/06/19 1620 06/09/19 0812 06/10/19 0622 06/11/19 0654  AST 14*  --   --   --   ALT 20  --   --   --   ALKPHOS 41  --   --   --   BILITOT 0.4  --   --   --   PROT 6.6  --   --   --   ALBUMIN 3.2* 2.6* 2.5* 2.6*   Recent Labs  Lab 06/06/19 1620  LIPASE 43   No results for input(s): AMMONIA in the last 168 hours. CBC: Recent Labs  Lab 06/06/19 1620 06/08/19 0523 06/09/19 0812 06/10/19 0622 06/11/19 0654  WBC 9.3 5.9 10.7* 7.1 8.9  NEUTROABS 7.5  --  10.0* 5.2 7.2  HGB 11.9* 12.4* 12.5* 11.7* 12.4*  HCT 39.3 41.1 40.8 37.8* 39.2  MCV 92.5 91.3 89.5 89.4 88.9  PLT 282 310 387 377 380   Cardiac Enzymes: No results for input(s): CKTOTAL, CKMB, CKMBINDEX, TROPONINI in the last 168 hours. BNP: Invalid input(s): POCBNP CBG: Recent Labs  Lab 06/10/19 1158 06/10/19 1603 06/10/19 2059 06/11/19 0847 06/11/19 1132  GLUCAP 176* 248* 210* 119* 121*   D-Dimer No results for input(s): DDIMER in the last 72 hours. Hgb A1c No results for input(s): HGBA1C in the last 72 hours. Lipid Profile No results for input(s): CHOL, HDL, LDLCALC, TRIG, CHOLHDL, LDLDIRECT in the last 72 hours. Thyroid function studies No results for input(s): TSH,  T4TOTAL, T3FREE, THYROIDAB in the last 72 hours.  Invalid input(s): FREET3 Anemia work up No results for input(s): VITAMINB12, FOLATE, FERRITIN, TIBC, IRON, RETICCTPCT in the last 72 hours. Urinalysis    Component Value Date/Time   COLORURINE YELLOW 06/06/2019 1640   APPEARANCEUR HAZY (A) 06/06/2019 1640   LABSPEC 1.025 06/06/2019 1640   PHURINE 6.0 06/06/2019 1640   GLUCOSEU NEGATIVE 06/06/2019 1640   GLUCOSEU NEGATIVE 03/20/2015 1012   HGBUR LARGE (A) 06/06/2019 1640   BILIRUBINUR NEGATIVE 06/06/2019 1640   BILIRUBINUR negative 07/27/2016 1614   KETONESUR NEGATIVE 06/06/2019 1640   PROTEINUR NEGATIVE 06/06/2019 1640   UROBILINOGEN negative 07/27/2016 1614   UROBILINOGEN 1.0 08/23/2015 1230   NITRITE NEGATIVE 06/06/2019 1640   LEUKOCYTESUR TRACE (A) 06/06/2019 1640   Sepsis Labs Invalid input(s): PROCALCITONIN,  WBC,  LACTICIDVEN Microbiology Recent Results (from the past 240 hour(s))  SARS Coronavirus 2 (Hosp order,Performed in [place] lab via [employer] ID)     Status: None   Collection Time: 06/06/19  4:20 PM   Specimen: Dry Nasal Swab ([facility] ID Now)  Result Value Ref Range Status   SARS Coronavirus 2 ([facility] ID Now) NEGATIVE NEGATIVE Final    Comment: (NOTE) Interpretive Result Comment(s): COVID 19 Positive SARS CoV 2 target nucleic acids are DETECTED. The SARS CoV 2 RNA is generally detectable in upper and lower respiratory specimens during the acute phase of infection.  Positive results are indicative of active infection with SARS CoV 2.  Clinical correlation with patient history and other diagnostic information is necessary to determine patient infection status.  Positive results do not rule out bacterial infection or coinfection with other viruses. The expected result is Negative. COVID 19 Negative SARS CoV 2 target nucleic acids are NOT DETECTED. The SARS CoV 2 RNA is generally detectable in upper and lower respiratory specimens during the acute phase of  infection.  Negative results do not preclude SARS CoV 2 infection, do not rule out coinfections with other pathogens, and should not be used as the sole basis for treatment or other patient management decisions.  Negative results must be combined with clinical  observations, patient history, and epidemiological information. The expected result is Negative. Invalid Presence or absence of SARS CoV 2 nucleic acids cannot be determined. Repeat testing was performed on the submitted specimen and repeated Invalid results were obtained.  If clinically indicated, additional testing on a new specimen with an alternate test methodology [phone]) is advised.  The SARS CoV 2 RNA is generally detectable in upper and lower respiratory specimens during the acute phase of infection. The expected result is Negative. Fact Sheet for Patients:  GolfingFamily.no Fact Sheet for Healthcare Providers: https://www.hernandez-brewer.com/ This test is not yet approved or cleared by the [place]  States FDA and has been authorized for detection and/or diagnosis of SARS CoV 2 by FDA under an Emergency Use Authorization (EUA).  This EUA will remain in effect (meaning this test can be used) for the duration of the COVID19 d eclaration under Section 564(b)(1) of the Act, 21 U.S.C. section [phone] 3(b)(1), unless the authorization is terminated or revoked sooner. Performed at [facility], [address] [place], [place] [address]   Blood culture (routine x 2)     Status: None   Collection Time: 06/06/19  4:34 PM   Specimen: BLOOD LEFT HAND  Result Value Ref Range Status   Specimen Description   Final    BLOOD LEFT HAND Performed at [facility], [place] [address] [place], [place] [address]    Special Requests   Final    BOTTLES DRAWN AEROBIC ONLY Blood Culture results may not be optimal due to an inadequate volume of blood received in culture bottles Performed at  [facility], [address] [place], [place] [address]    Culture   Final    NO GROWTH 5 DAYS Performed at [facility], [place] [address], [place], [place] [address]    Report Status 06/11/2019 FINAL  Final  Urine culture     Status: None   Collection Time: 06/06/19  4:40 PM   Specimen: Urine, Random  Result Value Ref Range Status   Specimen Description   Final    URINE, RANDOM Performed at [facility], [address] [place], [place] [address]    Special Requests   Final    NONE Performed at [facility], [address] [place], [place] [address]    Culture   Final    NO GROWTH Performed at [facility], [place] [address] [place], [place] [address]    Report Status 06/07/2019 FINAL  Final  MRSA PCR Screening     Status: None   Collection Time: 06/07/19  5:03 AM  Result Value Ref Range Status   MRSA by PCR NEGATIVE NEGATIVE Final    Comment:        The GeneXpert MRSA Assay (FDA approved for NASAL specimens only), is one component of a comprehensive MRSA colonization surveillance program. It is not intended to diagnose MRSA infection nor to guide or monitor treatment for MRSA infections. Performed at [facility], [place] [address], [place],  [address]   SARS Coronavirus 2 (CEPHEID - Performed in [place] [facility] lab), Hosp Order     Status: None   Collection Time: 06/07/19  5:18 AM   Specimen: Nasopharyngeal Swab  Result Value Ref Range Status   SARS Coronavirus 2 NEGATIVE NEGATIVE Final    Comment: (NOTE) If result is NEGATIVE SARS-CoV-2 target nucleic acids are NOT DETECTED. The SARS-CoV-2 RNA is generally detectable in upper and lower  respiratory specimens during the acute phase of infection. The lowest  concentration of SARS-CoV-2 viral copies this assay can detect is 250  copies / mL. A negative result does not preclude SARS-CoV-2 infection  and should not be used as the sole basis for treatment or  other  patient management decisions.  A negative result may occur with  improper specimen collection / handling, submission of specimen other  than nasopharyngeal swab, presence of viral mutation(s) within the  areas targeted by this assay, and inadequate number of viral copies  (<250 copies / mL). A negative  result must be combined with clinical  observations, patient history, and epidemiological information. If result is POSITIVE SARS-CoV-2 target nucleic acids are DETECTED. The SARS-CoV-2 RNA is generally detectable in upper and lower  respiratory specimens dur ing the acute phase of infection.  Positive  results are indicative of active infection with SARS-CoV-2.  Clinical  correlation with patient history and other diagnostic information is  necessary to determine patient infection status.  Positive results do  not rule out bacterial infection or co-infection with other viruses. If result is PRESUMPTIVE POSTIVE SARS-CoV-2 nucleic acids MAY BE PRESENT.   A presumptive positive result was obtained on the submitted specimen  and confirmed on repeat testing.  While 2019 novel coronavirus  (SARS-CoV-2) nucleic acids may be present in the submitted sample  additional confirmatory testing may be necessary for epidemiological  and / or clinical management purposes  to differentiate between  SARS-CoV-2 and other Sarbecovirus currently known to infect humans.  If clinically indicated additional testing with an alternate test  methodology [phone]) is advised. The SARS-CoV-2 RNA is generally  detectable in upper and lower respiratory sp ecimens during the acute  phase of infection. The expected result is Negative. Fact Sheet for Patients:  StrictlyIdeas.no Fact Sheet for Healthcare Providers: BankingDealers.co.za This test is not yet approved or cleared by the [place] FDA and has been authorized for detection and/or diagnosis of  SARS-CoV-2 by FDA under an Emergency Use Authorization (EUA).  This EUA will remain in effect (meaning this test can be used) for the duration of the COVID-19 declaration under Section 564(b)(1) of the Act, 21 U.S.C. section 360bbb-3(b)(1), unless the authorization is terminated or revoked sooner. Performed at [facility], [place] [address] [place], [place] [address]   Respiratory Panel by PCR     Status: None   Collection Time: 06/07/19  6:06 AM   Specimen: Nasopharyngeal Swab; Respiratory  Result Value Ref Range Status   Adenovirus NOT DETECTED NOT DETECTED Final   Coronavirus 229E NOT DETECTED NOT DETECTED Final    Comment: (NOTE) The Coronavirus on the Respiratory Panel, DOES NOT test for the novel  Coronavirus (2019 nCoV)    Coronavirus HKU1 NOT DETECTED NOT DETECTED Final   Coronavirus NL63 NOT DETECTED NOT DETECTED Final   Coronavirus OC43 NOT DETECTED NOT DETECTED Final   Metapneumovirus NOT DETECTED NOT DETECTED Final   Rhinovirus / Enterovirus NOT DETECTED NOT DETECTED Final   Influenza A NOT DETECTED NOT DETECTED Final   Influenza B NOT DETECTED NOT DETECTED Final   Parainfluenza Virus 1 NOT DETECTED NOT DETECTED Final   Parainfluenza Virus 2 NOT DETECTED NOT DETECTED Final   Parainfluenza Virus 3 NOT DETECTED NOT DETECTED Final   Parainfluenza Virus 4 NOT DETECTED NOT DETECTED Final   Respiratory Syncytial Virus NOT DETECTED NOT DETECTED Final   Bordetella pertussis NOT DETECTED NOT DETECTED Final   Chlamydophila pneumoniae NOT DETECTED NOT DETECTED Final   Mycoplasma pneumoniae NOT DETECTED NOT DETECTED Final    Comment: Performed at [facility], [place], [place], [place] [address]     Time coordinating discharge: 35 minutes  SIGNED:   Jonnie Finner, DO  [facility] 06/11/2019, 1:09 PM Pager   If 7PM-7AM, please contact night-coverage [email or web address] Password [email or web address]

## 2019-06-11 NOTE — TOC Transition Note (Signed)
Transition of Care [facility]) - CM/SW Discharge Note   Patient Details  Name: Matthew Nash MRN: [id number] Date of Birth: 1936/10/18  Transition of Care [facility]) CM/SW Contact:  Zenon Mayo, RN Phone Number: 06/11/2019, 2:03 PM   Clinical Narrative:    From home with wife, for dc today, will need HHPT,  NCM offered choice from Medicare.gov list, he chose [facility], referral given to [facility] for HHPT, soc will begin 24-48 hrs post dc. He also will need a rolling walker.  Patient states it is ok to get from [facility], referral made to [facility] for rolling walker, he will bring to room prior to dc.    Final next level of care: [facility] Barriers to Discharge: No Barriers Identified   Patient Goals and CMS Choice Patient states their goals for this hospitalization and ongoing recovery are:: to take a shower CMS Medicare.gov Compare Post Acute Care list provided to:: Patient Choice offered to / list presented to : Patient  Discharge Placement                       Discharge Plan and Services                DME Arranged: Walker rolling DME Agency: [facility] Date DME Agency Contacted: 06/11/19 Time DME Agency Contacted: [phone] Representative spoke with at DME Agency: zack HH Arranged: PT [place]: [place] ([place]) Date [place]: 06/11/19 Time [place]: [phone] Representative spoke with at [place]: [place] ([place]) Interventions     Readmission Risk Interventions No flowsheet data found.

## 2019-06-11 NOTE — Evaluation (Signed)
Physical Therapy Evaluation Patient Details Name: Matthew Nash MRN: [id number] DOB: Jan 24, 1936 Today's Date: 06/11/2019   History of Present Illness  83 yo male with onset of acute SOB was admitted for acute respiratory failure with 77% O2 sats, atelectasis, with 3L O2 needed.  Pt is Covid (-).  Has been referred to PT for dc to home if able.  PMHx:  HTN, DM, PVD, emphysema, atherosclerosis, OSA, on CPAP at night, CKD, Hep B, skin CA, lumbar DDD and spondylosis  Clinical Impression  Pt was seen for assessment of his mobility and noted strength and balance changes that impact both gait and transfers.  He is hoping to go directly home and his main issue will be recovering his strength to increase control of lower height transfers.  This will be addressed wth HHPT and during acute therapy sessions.  Follow acutely for hip knee and ankle strength deficits, for balance during walking and control of transfers to stand and to get to chair.    Follow Up Recommendations Home health PT;Supervision for mobility/OOB    Equipment Recommendations  Rolling walker with 5" wheels    Recommendations for Other Services       Precautions / Restrictions Precautions Precautions: Fall Precaution Comments: monitor O2 sats Restrictions Weight Bearing Restrictions: No      Mobility  Bed Mobility Overal bed mobility: Needs Assistance Bed Mobility: Supine to Sit;Sit to Supine     Supine to sit: Min guard Sit to supine: Min guard   General bed mobility comments: following instructions, min guard to support trunk on sitting up effort, scoots out alone  Transfers Overall transfer level: Needs assistance Equipment used: Rolling walker (2 wheeled);1 person hand held assist Transfers: Sit to/from Stand Sit to Stand: Min assist;From elevated surface         General transfer comment: reminders to use his hands to push off bed  Ambulation/Gait Ambulation/Gait assistance: Min guard Gait Distance  (Feet): 125 Feet Assistive device: Rolling walker (2 wheeled);1 person hand held assist Gait Pattern/deviations: Step-through pattern;Wide base of support;Trunk flexed;Decreased stride length Gait velocity: reduced Gait velocity interpretation: <1.31 ft/sec, indicative of household ambulator General Gait Details: PT brought a seat in case pt fatigued but was able to maintain the pace and walk from room down hall and back with no rest  Stairs            Wheelchair Mobility    Modified Rankin (Stroke Patients Only)       Balance Overall balance assessment: History of Falls;Needs assistance Sitting-balance support: Feet supported Sitting balance-Leahy Scale: Good     Standing balance support: Bilateral upper extremity supported;During functional activity Standing balance-Leahy Scale: Fair Standing balance comment: less than fair dynamic balance                             Pertinent Vitals/Pain Pain Assessment: No/denies pain    Home Living Family/patient expects to be discharged to:: Private residence Living Arrangements: Spouse/significant other Available Help at Discharge: Family;Available 24 hours/day Type of Home: House Home Access: Stairs to enter Entrance Stairs-Rails: None(stair is a threshhold height) Entrance Stairs-Number of Steps: 1 Home Layout: One level Home Equipment: Walker - 4 wheels;Shower seat Additional Comments: pt has been able to walk with no assist but with recent falls requiring EMT's to help    Prior Function Level of Independence: Independent with assistive device(s)         Comments: until recent falls  was able to walk with no help     Hand Dominance   Dominant Hand: Right    Extremity/Trunk Assessment   Upper Extremity Assessment Upper Extremity Assessment: Overall WFL for tasks assessed    Lower Extremity Assessment Lower Extremity Assessment: Generalized weakness    Cervical / Trunk Assessment Cervical /  Trunk Assessment: Normal  Communication   Communication: No difficulties  Cognition Arousal/Alertness: Awake/alert Behavior During Therapy: WFL for tasks assessed/performed Overall Cognitive Status: Within Functional Limits for tasks assessed                                 General Comments: follows PT questions and aware of his limitations      General Comments General comments (skin integrity, edema, etc.): pt is up to side of bed and had to take up the height to get min assist level of standing to power up, and prior to that was mod max on low height    Exercises Other Exercises Other Exercises: pt compensates with gait for foot drop because his AFO's are in very big shoes that he finds are more likely to trip him   Assessment/Plan    PT Assessment Patient needs continued PT services  PT Problem List Decreased strength;Decreased range of motion;Decreased activity tolerance;Decreased balance;Decreased mobility;Decreased coordination;Cardiopulmonary status limiting activity;Obesity       PT Treatment Interventions DME instruction;Gait training;Functional mobility training;Therapeutic activities;Therapeutic exercise;Balance training;Neuromuscular re-education;Patient/family education    PT Goals (Current goals can be found in the Care Plan section)  Acute Rehab PT Goals Patient Stated Goal: to walk and get home PT Goal Formulation: With patient Time For Goal Achievement: 06/25/19 Potential to Achieve Goals: Good    Frequency Min 4X/week   Barriers to discharge Decreased caregiver support home with wife who may not be able to assist him from lower height to stand    Co-evaluation               AM-PAC PT "6 Clicks" Mobility  Outcome Measure Help needed turning from your back to your side while in a flat bed without using bedrails?: A Little Help needed moving from lying on your back to sitting on the side of a flat bed without using bedrails?: A  Little Help needed moving to and from a bed to a chair (including a wheelchair)?: A Little Help needed standing up from a chair using your arms (e.g., wheelchair or bedside chair)?: A Lot Help needed to walk in hospital room?: A Little Help needed climbing 3-5 steps with a railing? : A Little 6 Click Score: 17    End of Session Equipment Utilized During Treatment: Gait belt Activity Tolerance: Patient tolerated treatment well;Patient limited by fatigue Patient left: with call bell/phone within reach;in chair;with chair alarm set Nurse Communication: Mobility status PT Visit Diagnosis: Unsteadiness on feet (R26.81);Muscle weakness (generalized) (M62.81);Difficulty in walking, not elsewhere classified (R26.2)    Time: 8099-8338 PT Time Calculation (min) (ACUTE ONLY): 28 min   Charges:   PT Evaluation $PT Eval Moderate Complexity: 1 Mod PT Treatments $Gait Training: 8-22 mins       Ramond Dial 06/11/2019, 12:35 PM  Mee Hives, PT MS Acute Rehab Dept. Number: [place]

## 2019-06-12 ENCOUNTER — Telehealth: Payer: Self-pay | Admitting: *Deleted

## 2019-06-12 NOTE — Telephone Encounter (Signed)
LVM. Pt has pcp appt 06/13/19

## 2019-06-12 NOTE — Progress Notes (Signed)
[name] [facility] [address] [place], [place] [address] [phone]  Date:  06/13/2019   Name:  Matthew Nash   DOB:  Dec 13, 1935   MRN:  [id number]  PCP:  Matthew Mclean, MD    Chief Complaint: Hospitalization Follow-up   History of Present Illness:  Matthew Nash is a 83 y.o. very pleasant male patient who presents with the following:  Virtual visit today for hospital follow-up Pt location is home, provider is at office I saw Matthew Nash in the office on 6/25 and sent him to the ER- he was admitted as follows:  Admit date: 06/06/2019 Discharge date: 06/11/2019 Recommendations for Outpatient Follow-up:  1. Follow up with PCP in 1-2 weeks 2. Please obtain BMP/CBC in one week  Equipment/Devices: Rolling Walker  Brief/Interim Summary: Matthew Nash a 83 y.o.malewith medical history significant ofhypertension, hyperlipidemia, diabetes mellitus, GERD, anxiety, BPH, HBV, PVD, OSA on CPAP, who presents with shortness of breath.  Patient states that he has been having shortness of breath in the past several days, which has been progressively getting worse. He has cough with little mucus production. He has intermittent runny nose, but no sore throat. Denies fever or chills. No chest pain. Patient was found to have oxygen desaturation to 77% on room air, and temporarily required BiPAP in ED, currently on 6 L nasal cannula oxygen with oxygen saturation 95%. Patient denies nausea,vomiting, diarrhea, abdominal pain, symptoms of UTI or unilateral weakness. Patient was found to have elevated d-dimer 3.68, 1 dose of Lovenox 75 mg was given in ED.  Discharge Diagnoses:  Principal Problem:   Acute respiratory failure with hypoxia (HCC) Active Problems:   Diabetes mellitus type II, controlled ([place])   Gastroesophageal reflux disease without esophagitis   Hyperlipidemia LDL goal <100   OSA on CPAP   HTN (hypertension)    AKI (acute kidney injury) (HCC)   Elevated troponin  Acute respiratory failure with hypoxia - Etiology is not clear. Abbott and Cepheid [id number] tests are negative.  - Chest x-ray showed a bilateral atelectasis; CT chest w/o constrast shows the same - No fever or leukocytosis, low suspicions for pneumonia.  - Patient has elevated d-dimer 3.68, VQ scan negative (unable to do CTA d/t renal function); Patient was given 1 dose of Lovenox 75 mg at 7:00 PM.  - TTE EF 65% - placed on BiPap d/t hypercapneia (uses CPAP at night d/t OSA) - Mucinex for cough  - Atrovent inhaler, prn albuterol or Xopenex inhaler; solumedrol - Follow up blood culture x2, sputum culture - respiratory virus panel negative - BLE doppler to r/o DVT negative - wean solumedrol down to qday today and if hold stable, will start a prednisone taper tomorrow - stable on RA, continue steroid taper Diabetes mellitus type II, controlled - A1c is 6.8 - Patient is taking metformin at home - SSI; carb modified diet Gastroesophageal reflux disease without esophagitis - Protonix Hyperlipidemia LDL goal <100: - zocor OSA - on CPAP at home - cpap at night HTN:  - Hold lisinopril due to AKI - amlodipine 5 mg daily - IV hydralazine prn AKIon CKD2 - Likely due to dehydration and continuation of lisinopril - IV fluid: Patient received 1L of NS in ED - hold lisinopril - we'll give him a little more fluid today; his SCr has settled in at 1.4 which is still off his baseline Elevated troponin - Troponin 19, 21, 20.  - No  CP.  - Most likely due to demand ischemia secondary to hypoxia and stress. Chronic urinary retention - continue I&O caths - on chronic keflex 500mg  qday, continue Chronic constipation - miralax, metamucil - c/o of being bloated/worse constipation; check KUB, add  senakot - KUB was negative; denies complaints this AM Debility/Gait instability - PT consult     - PT recs HHPT, RW  He notes that he is feeling a bit dizzy today-his blood pressure is low He thinks this is due to stopping lisinopril and being put on amlodipine due to ARI He was on 5 mg of lisinopril and they put him on 10 of amlodipine; per DC summary he was supposed to be on 5 mg but he got an rx for 10 mg apparently which she started yesterday His breathing is improving steadily He is able to walk a couple of hundred yards and does ok at this point  His appetite is good Overall he feels like he is getting much better, not quite back to normal yet  Patient Active Problem List   Diagnosis Date Noted  . HTN (hypertension) 06/07/2019  . AKI (acute kidney injury) ([place]) 06/07/2019  . Elevated troponin 06/07/2019  . Acute respiratory failure with hypoxia ([place]) 06/06/2019  . Chronic kidney disease 11/25/2018  . Ischemic chest pain ([place]) 11/25/2018  . Chest pain 11/25/2018  . Pelvic floor dysfunction 04/04/2018  . Change in bowel habits 04/04/2018  . OSA on CPAP 01/12/2018  . REM behavioral disorder 01/12/2018  . Diarrhea 09/15/2017  . Incontinence of feces 09/15/2017  . Peripheral neuropathy 10/20/2016  . Bladder outlet obstruction 05/02/2016  . OA (osteoarthritis) of knee 05/27/2015  . Diabetes mellitus type II, controlled ([place]) 11/26/2014  . Gastroesophageal reflux disease without esophagitis 11/26/2014  . Arthritis of both knees 11/26/2014  . Absence of bladder continence 11/26/2014  . Hyperlipidemia LDL goal <100 11/26/2014  . Acute medial meniscal tear 07/16/2014  . Lumbosacral spondylosis without myelopathy 02/17/2014  . Spondylosis, lumbosacral 02/17/2014    Past Medical History:  Diagnosis Date  . Arthritis    both knees  . Back pain at L4-L5 level   . Basal cell carcinoma   . BPH (benign prostatic hyperplasia)    hx s/p turp  . Chronic back pain     spondylosis  . Chronic kidney disease    kidney stones 1979  . DDD (degenerative disc disease), lumbar   . Diabetes mellitus    takes Metformin and Victoza daily-under control  . Diarrhea    x 1 today  . Diverticulitis   . Gallstones   . GERD (gastroesophageal reflux disease)    takes Omeprazole daily-under control  . H/O hiatal hernia   . H/O measles   . Hepatitis B    B-with Mono and jaundice 1960  . History of chicken pox   . History of colon polyps   . History of kidney stones   . Hyperlipidemia    takes Simvastatin and Tricor daily-under control  . Hypertension    takes Lisinopril daily-under control  . Joint pain    both knees  . Joint swelling    rt knee  . Kidney stones   . Melanoma ([place])    right hand;basal cell carcinoma  . Neuropathy   . Peripheral vascular disease ([place])    diabetic neuropathy  . Pneumonia    hx of  . Prostate infection    currently taking macrobid  . Sleep apnea with use of continuous positive airway  pressure (CPAP)   . Tinnitus   . Urinary incontinence   . UTI (lower urinary tract infection)     Past Surgical History:  Procedure Laterality Date  . BASAL CELL CARCINOMA EXCISION    . BOTOX INJECTION  03/22/2017   In the bladder for nightly incontinence.  . C5 and T1 bone chip removed   1986  . [place]  . CHOLECYSTECTOMY  1993  . COLONOSCOPY  2011  . cortisone injection    . EYE SURGERY     cataract sx 2017 bilateral  . I&D left knee Left 1958   states 22 times  . KNEE ARTHROSCOPY Right 07/16/2014   Procedure: RIGHT ARTHROSCOPY KNEE WITH Medial and Lateral DEBRIDEMENT and chondroplasty;  Surgeon: Gearlean Alf, MD;  Location: [facility];  Service: Orthopedics;  Laterality: Right;  . LUMBAR LAMINECTOMY/DECOMPRESSION MICRODISCECTOMY Right 02/17/2014   Procedure: RIGHT LUMBAR TWO-THREE LAMINECTOMY;  Surgeon: Charlie Pitter, MD;  Location: [facility];  Service: Neurosurgery;  Laterality: Right;  right   . right shoulder  surgery  2012   rotator cuff repair  . SHOULDER ARTHROSCOPY W/ ROTATOR CUFF REPAIR Left NOV 2010  . TONSILLECTOMY  1942  . TOTAL KNEE ARTHROPLASTY Right 05/27/2015   Procedure: RIGHT TOTAL KNEE ARTHROPLASTY;  Surgeon: Gaynelle Arabian, MD;  Location: [facility];  Service: Orthopedics;  Laterality: Right;  . TOTAL KNEE ARTHROPLASTY Left 05/30/2016   Procedure: LEFT TOTAL KNEE ARTHROPLASTY;  Surgeon: Gaynelle Arabian, MD;  Location: [facility];  Service: Orthopedics;  Laterality: Left;  . TRANSURETHRAL RESECTION OF PROSTATE  2006  . VASECTOMY  1983  . WISDOM TOOTH EXTRACTION      Social History   Tobacco Use  . Smoking status: Former Smoker    Types: Pipe    Quit date: 07/14/1978    Years since quitting: 40.9  . Smokeless tobacco: Never Used  . Tobacco comment: quit smoking 1979  Substance Use Topics  . Alcohol use: Yes    Comment: OCCASIONAL  . Drug use: No    Family History  Problem Relation Age of Onset  . Heart disease Mother 23       Deceased  . Bladder Cancer Mother   . Breast cancer Mother   . Prostate cancer Father        Deceased-in 19s  . Healthy Brother   . Skin cancer Brother        "basal cell cancer in the bloodstream"  . Asthma Sister        Deceased  . Healthy Son        #1  . Healthy Daughter        #2  . Obesity Son        #2    Allergies  Allergen Reactions  . Sulfamethoxazole-Trimethoprim Diarrhea    Severe diarrhea  . Albamycin [Novobiocin] Itching and Rash  . Penicillins Rash and Other (See Comments)    Has patient had a PCN reaction causing immediate rash, facial/tongue/throat swelling, SOB or lightheadedness with hypotension:  no Has patient had a PCN reaction causing severe rash involving mucus membranes or skin necrosis: no Has patient had a PCN reaction that required hospitalization no Has patient had a PCN reaction occurring within the last 10 years: no - childhood reaction If all of the above answers are "NO", then may proceed with Cephalosporin  use.     Medication list has been reviewed and updated.  Current Outpatient Medications on File Prior to  Visit  Medication Sig Dispense Refill  . amLODipine (NORVASC) 10 MG tablet Take 1 tablet (10 mg total) by mouth daily. 30 tablet 0  . aspirin EC 81 MG tablet Take 81 mg by mouth daily.    [name] Kitchen CALCIUM-MAGNESIUM-VITAMIN D PO Take 2 tablets by mouth 2 (two) times daily.     . clonazePAM (KLONOPIN) 0.5 MG tablet Take 1 tablet (0.5 mg total) by mouth at bedtime. (Patient taking differently: Take 0.25 mg by mouth at bedtime. ) 90 tablet 0  . FIBER PO Take 3 capsules by mouth 2 (two) times daily.     . Melatonin 10 MG TABS Take 1 tablet by mouth at bedtime.    . metFORMIN (GLUCOPHAGE) 500 MG tablet Take 500 mg by mouth 2 (two) times daily with a meal.    . Multiple Vitamin (MULTI-VITAMINS) TABS Take 1 tablet by mouth daily.     [name] Kitchen omeprazole (PRILOSEC) 20 MG capsule TAKE 1 CAPSULE DAILY (Patient taking differently: Take 20 mg by mouth every morning. ) 90 capsule 3  . Polyethylene Glycol 3350 (MIRALAX PO) Take 17 g by mouth daily.     . predniSONE (DELTASONE) 10 MG tablet Take 4 tablets (40 mg total) by mouth daily for 1 day, THEN 3 tablets (30 mg total) daily for 1 day, THEN 2 tablets (20 mg total) daily for 1 day, THEN 1 tablet (10 mg total) daily for 1 day. 10 tablet 0  . Probiotic Product (PROBIOTIC ADVANCED PO) Take 1 tablet by mouth daily.     . simvastatin (ZOCOR) 10 MG tablet TAKE 1 TABLET AT BEDTIME (Patient taking differently: Take 10 mg by mouth at bedtime. ) 90 tablet 1  . TRICOR 145 MG tablet TAKE 1 TABLET EVERY EVENING 90 tablet 4  . vitamin C (ASCORBIC ACID) 500 MG tablet Take 500 mg by mouth daily.     No current facility-administered medications on file prior to visit.     Review of Systems:  As per HPI- otherwise negative. No fever chills  Physical Examination: Vitals:   06/13/19 1520  BP: 90/60  Pulse: 78  Temp: 98 F (36.7 C)  SpO2: 98%   Vitals:   06/13/19 1520   Weight: 231 lb (104.8 kg)  Height: 5\' 11"  (1.803 m)   Body mass index is 32.22 kg/m. Ideal Body Weight: Weight in (lb) to have BMI = 25: 178.9  GEN: WDWN, NAD, Non-toxic, A & O x 3, appears his normal self and much improved today HEENT: Atraumatic, Normocephalic. Neck supple. No masses, No LAD. Ears and Nose: No external deformity. CV: RRR, No M/G/R. No JVD. No thrill. No extra heart sounds. PULM: CTA B, no wheezes, crackles, rhonchi. No retractions. No resp. distress. No accessory muscle use.  Lung sounds fine ABD: S, NT, ND EXTR: No c/c/e NEURO Normal gait for pt- he has foot drop and uses a walker PSYCH: Normally interactive. Conversant. Not depressed or anxious appearing.  Calm demeanor.    Assessment and Plan:   ICD-10-CM   1. Hospital discharge follow-up  N82 Basic metabolic panel    CBC  2. Medication monitoring encounter  Z51.81 CBC  3. Hypoxemia  R09.02   4. AKI (acute kidney injury) ([place])  N17.9    Following up from recent hospital discharge today-Matthew Nash was admitted with acute hypoxia.  Exact cause was not determined, but he is doing much better.  His oxygen saturations are back to normal and his breathing is comfortable again  He did have  acute renal insufficiency on admission with max creatinine of 2.1-normal baseline is 1 - 1.1 His 5 mg of lisinopril was stopped, and he was put I think inadvertently started on 10 mg amlodipine.  At most recent labs his kidney function was nearly back to baseline, will have him stop amlodipine and restart lisinopril at this time as his BP is too low Creatinine clearance was also appropriate for metformin use  Lab appoint for next week to check a BMP and CBC  He will give an update as to how he is doing - BP readings- on mychart over the weekend   Follow-up: No follow-ups on file.  No orders of the defined types were placed in this encounter.  Orders Placed This Encounter  Procedures  . Basic metabolic panel  . CBC       Signed Lamar Blinks, MD

## 2019-06-13 ENCOUNTER — Other Ambulatory Visit: Payer: Self-pay

## 2019-06-13 ENCOUNTER — Telehealth: Payer: Self-pay | Admitting: Family Medicine

## 2019-06-13 ENCOUNTER — Encounter: Payer: Self-pay | Admitting: Family Medicine

## 2019-06-13 ENCOUNTER — Ambulatory Visit (INDEPENDENT_AMBULATORY_CARE_PROVIDER_SITE_OTHER): Payer: Medicare Other | Admitting: Family Medicine

## 2019-06-13 VITALS — BP 90/60 | HR 78 | Temp 98.0°F | Ht 71.0 in | Wt 231.0 lb

## 2019-06-13 DIAGNOSIS — Z7984 Long term (current) use of oral hypoglycemic drugs: Secondary | ICD-10-CM | POA: Diagnosis not present

## 2019-06-13 DIAGNOSIS — Z5181 Encounter for therapeutic drug level monitoring: Secondary | ICD-10-CM | POA: Diagnosis not present

## 2019-06-13 DIAGNOSIS — N182 Chronic kidney disease, stage 2 (mild): Secondary | ICD-10-CM | POA: Diagnosis not present

## 2019-06-13 DIAGNOSIS — E1122 Type 2 diabetes mellitus with diabetic chronic kidney disease: Secondary | ICD-10-CM | POA: Diagnosis not present

## 2019-06-13 DIAGNOSIS — K219 Gastro-esophageal reflux disease without esophagitis: Secondary | ICD-10-CM | POA: Diagnosis not present

## 2019-06-13 DIAGNOSIS — R42 Dizziness and giddiness: Secondary | ICD-10-CM | POA: Diagnosis not present

## 2019-06-13 DIAGNOSIS — E785 Hyperlipidemia, unspecified: Secondary | ICD-10-CM | POA: Diagnosis not present

## 2019-06-13 DIAGNOSIS — Z09 Encounter for follow-up examination after completed treatment for conditions other than malignant neoplasm: Secondary | ICD-10-CM

## 2019-06-13 DIAGNOSIS — G4733 Obstructive sleep apnea (adult) (pediatric): Secondary | ICD-10-CM | POA: Diagnosis not present

## 2019-06-13 DIAGNOSIS — Z7982 Long term (current) use of aspirin: Secondary | ICD-10-CM | POA: Diagnosis not present

## 2019-06-13 DIAGNOSIS — I129 Hypertensive chronic kidney disease with stage 1 through stage 4 chronic kidney disease, or unspecified chronic kidney disease: Secondary | ICD-10-CM | POA: Diagnosis not present

## 2019-06-13 DIAGNOSIS — E1151 Type 2 diabetes mellitus with diabetic peripheral angiopathy without gangrene: Secondary | ICD-10-CM | POA: Diagnosis not present

## 2019-06-13 DIAGNOSIS — N179 Acute kidney failure, unspecified: Secondary | ICD-10-CM

## 2019-06-13 DIAGNOSIS — R0902 Hypoxemia: Secondary | ICD-10-CM

## 2019-06-13 DIAGNOSIS — J9601 Acute respiratory failure with hypoxia: Secondary | ICD-10-CM | POA: Diagnosis not present

## 2019-06-13 DIAGNOSIS — R339 Retention of urine, unspecified: Secondary | ICD-10-CM | POA: Diagnosis not present

## 2019-06-13 DIAGNOSIS — E1140 Type 2 diabetes mellitus with diabetic neuropathy, unspecified: Secondary | ICD-10-CM | POA: Diagnosis not present

## 2019-06-13 NOTE — Telephone Encounter (Signed)
LMOM for [facility] w/ verbal orders.

## 2019-06-13 NOTE — Patient Instructions (Addendum)
It was good to see you today- you are looking so much better! I think you are dizzy because your BP is too low STOP the amlodipine and go back to 5 mg of lisinopril daily We will bring you in next week for labs only  Please keep me closely updated regarding your BP and how you are feeling

## 2019-06-13 NOTE — Telephone Encounter (Signed)
Home Health Verbal Orders - Caller/Agency: [place] with [facility]  Callback Number: [phone] Requesting OT/PT/Skilled Nursing/Social Work/Speech Therapy: Need verbal for Pt and order for OT  Frequency: 1 week 1 2 week 2 1 week 6

## 2019-06-17 DIAGNOSIS — N182 Chronic kidney disease, stage 2 (mild): Secondary | ICD-10-CM | POA: Diagnosis not present

## 2019-06-17 DIAGNOSIS — E1140 Type 2 diabetes mellitus with diabetic neuropathy, unspecified: Secondary | ICD-10-CM | POA: Diagnosis not present

## 2019-06-17 DIAGNOSIS — E1122 Type 2 diabetes mellitus with diabetic chronic kidney disease: Secondary | ICD-10-CM | POA: Diagnosis not present

## 2019-06-17 DIAGNOSIS — I129 Hypertensive chronic kidney disease with stage 1 through stage 4 chronic kidney disease, or unspecified chronic kidney disease: Secondary | ICD-10-CM | POA: Diagnosis not present

## 2019-06-17 DIAGNOSIS — J9601 Acute respiratory failure with hypoxia: Secondary | ICD-10-CM | POA: Diagnosis not present

## 2019-06-17 DIAGNOSIS — E1151 Type 2 diabetes mellitus with diabetic peripheral angiopathy without gangrene: Secondary | ICD-10-CM | POA: Diagnosis not present

## 2019-06-18 ENCOUNTER — Encounter: Payer: Self-pay | Admitting: Family Medicine

## 2019-06-18 DIAGNOSIS — E1122 Type 2 diabetes mellitus with diabetic chronic kidney disease: Secondary | ICD-10-CM | POA: Diagnosis not present

## 2019-06-18 DIAGNOSIS — G5601 Carpal tunnel syndrome, right upper limb: Secondary | ICD-10-CM | POA: Diagnosis not present

## 2019-06-18 DIAGNOSIS — I129 Hypertensive chronic kidney disease with stage 1 through stage 4 chronic kidney disease, or unspecified chronic kidney disease: Secondary | ICD-10-CM | POA: Diagnosis not present

## 2019-06-18 DIAGNOSIS — G4752 REM sleep behavior disorder: Secondary | ICD-10-CM | POA: Diagnosis not present

## 2019-06-18 DIAGNOSIS — R270 Ataxia, unspecified: Secondary | ICD-10-CM | POA: Diagnosis not present

## 2019-06-18 DIAGNOSIS — E1142 Type 2 diabetes mellitus with diabetic polyneuropathy: Secondary | ICD-10-CM | POA: Diagnosis not present

## 2019-06-18 DIAGNOSIS — J9601 Acute respiratory failure with hypoxia: Secondary | ICD-10-CM | POA: Diagnosis not present

## 2019-06-18 DIAGNOSIS — Z7984 Long term (current) use of oral hypoglycemic drugs: Secondary | ICD-10-CM | POA: Diagnosis not present

## 2019-06-18 DIAGNOSIS — N182 Chronic kidney disease, stage 2 (mild): Secondary | ICD-10-CM | POA: Diagnosis not present

## 2019-06-18 DIAGNOSIS — E1151 Type 2 diabetes mellitus with diabetic peripheral angiopathy without gangrene: Secondary | ICD-10-CM | POA: Diagnosis not present

## 2019-06-18 DIAGNOSIS — E1140 Type 2 diabetes mellitus with diabetic neuropathy, unspecified: Secondary | ICD-10-CM | POA: Diagnosis not present

## 2019-06-19 ENCOUNTER — Other Ambulatory Visit (INDEPENDENT_AMBULATORY_CARE_PROVIDER_SITE_OTHER): Payer: Medicare Other

## 2019-06-19 ENCOUNTER — Other Ambulatory Visit: Payer: Self-pay

## 2019-06-19 DIAGNOSIS — Z09 Encounter for follow-up examination after completed treatment for conditions other than malignant neoplasm: Secondary | ICD-10-CM | POA: Diagnosis not present

## 2019-06-19 DIAGNOSIS — Z5181 Encounter for therapeutic drug level monitoring: Secondary | ICD-10-CM | POA: Diagnosis not present

## 2019-06-20 ENCOUNTER — Telehealth: Payer: Self-pay | Admitting: Family Medicine

## 2019-06-20 DIAGNOSIS — J9601 Acute respiratory failure with hypoxia: Secondary | ICD-10-CM | POA: Diagnosis not present

## 2019-06-20 DIAGNOSIS — I129 Hypertensive chronic kidney disease with stage 1 through stage 4 chronic kidney disease, or unspecified chronic kidney disease: Secondary | ICD-10-CM | POA: Diagnosis not present

## 2019-06-20 DIAGNOSIS — E1140 Type 2 diabetes mellitus with diabetic neuropathy, unspecified: Secondary | ICD-10-CM | POA: Diagnosis not present

## 2019-06-20 DIAGNOSIS — E1122 Type 2 diabetes mellitus with diabetic chronic kidney disease: Secondary | ICD-10-CM | POA: Diagnosis not present

## 2019-06-20 DIAGNOSIS — E1151 Type 2 diabetes mellitus with diabetic peripheral angiopathy without gangrene: Secondary | ICD-10-CM | POA: Diagnosis not present

## 2019-06-20 DIAGNOSIS — N182 Chronic kidney disease, stage 2 (mild): Secondary | ICD-10-CM | POA: Diagnosis not present

## 2019-06-20 LAB — CBC
HCT: 36.1 % — ABNORMAL LOW (ref 39.0–52.0)
Hemoglobin: 11.7 g/dL — ABNORMAL LOW (ref 13.0–17.0)
MCHC: 32.4 g/dL (ref 30.0–36.0)
MCV: 87.1 fl (ref 78.0–100.0)
Platelets: 256 10*3/uL (ref 150.0–400.0)
RBC: 4.14 Mil/uL — ABNORMAL LOW (ref 4.22–5.81)
RDW: 14.6 % (ref 11.5–15.5)
WBC: 6.4 10*3/uL (ref 4.0–10.5)

## 2019-06-20 LAB — BASIC METABOLIC PANEL
BUN: 23 mg/dL (ref 6–23)
CO2: 30 mEq/L (ref 19–32)
Calcium: 9.3 mg/dL (ref 8.4–10.5)
Chloride: 105 mEq/L (ref 96–112)
Creatinine, Ser: 1.11 mg/dL (ref 0.40–1.50)
GFR: 63.55 mL/min (ref >60.00)
Glucose, Bld: 108 mg/dL — ABNORMAL HIGH (ref 70–99)
Potassium: 4.4 mEq/L (ref 3.5–5.1)
Sodium: 143 mEq/L (ref 135–145)

## 2019-06-20 NOTE — Telephone Encounter (Signed)
LMOM for [place] with verbal orders.

## 2019-06-20 NOTE — Telephone Encounter (Signed)
Home Health Verbal Orders - Caller/Agency: [facility] Callback Number: [phone] [place] Requesting OT/PT/Skilled Nursing/Social Work/Speech Therapy: OT Frequency: 1 time a week for 4 weeks

## 2019-06-21 ENCOUNTER — Encounter: Payer: Self-pay | Admitting: Family Medicine

## 2019-06-21 ENCOUNTER — Other Ambulatory Visit: Payer: Self-pay | Admitting: Family Medicine

## 2019-06-21 DIAGNOSIS — R198 Other specified symptoms and signs involving the digestive system and abdomen: Secondary | ICD-10-CM | POA: Diagnosis not present

## 2019-06-21 DIAGNOSIS — D649 Anemia, unspecified: Secondary | ICD-10-CM

## 2019-06-21 NOTE — Progress Notes (Signed)
ifob has been mailed to patient for completion.

## 2019-06-24 DIAGNOSIS — J9601 Acute respiratory failure with hypoxia: Secondary | ICD-10-CM | POA: Diagnosis not present

## 2019-06-24 DIAGNOSIS — E1122 Type 2 diabetes mellitus with diabetic chronic kidney disease: Secondary | ICD-10-CM | POA: Diagnosis not present

## 2019-06-24 DIAGNOSIS — E1140 Type 2 diabetes mellitus with diabetic neuropathy, unspecified: Secondary | ICD-10-CM | POA: Diagnosis not present

## 2019-06-24 DIAGNOSIS — I129 Hypertensive chronic kidney disease with stage 1 through stage 4 chronic kidney disease, or unspecified chronic kidney disease: Secondary | ICD-10-CM | POA: Diagnosis not present

## 2019-06-24 DIAGNOSIS — N182 Chronic kidney disease, stage 2 (mild): Secondary | ICD-10-CM | POA: Diagnosis not present

## 2019-06-24 DIAGNOSIS — E1151 Type 2 diabetes mellitus with diabetic peripheral angiopathy without gangrene: Secondary | ICD-10-CM | POA: Diagnosis not present

## 2019-06-25 ENCOUNTER — Encounter: Payer: Self-pay | Admitting: Family Medicine

## 2019-06-25 DIAGNOSIS — K5900 Constipation, unspecified: Secondary | ICD-10-CM | POA: Diagnosis not present

## 2019-06-25 DIAGNOSIS — R159 Full incontinence of feces: Secondary | ICD-10-CM | POA: Diagnosis not present

## 2019-06-25 DIAGNOSIS — K6289 Other specified diseases of anus and rectum: Secondary | ICD-10-CM | POA: Diagnosis not present

## 2019-06-26 ENCOUNTER — Encounter: Payer: Self-pay | Admitting: Family Medicine

## 2019-06-26 DIAGNOSIS — R490 Dysphonia: Secondary | ICD-10-CM | POA: Diagnosis not present

## 2019-06-26 DIAGNOSIS — J382 Nodules of vocal cords: Secondary | ICD-10-CM | POA: Diagnosis not present

## 2019-06-26 NOTE — Telephone Encounter (Signed)
[place] at [facility] [phone]) and requested report. They will fax to front office fax.

## 2019-06-27 DIAGNOSIS — I129 Hypertensive chronic kidney disease with stage 1 through stage 4 chronic kidney disease, or unspecified chronic kidney disease: Secondary | ICD-10-CM | POA: Diagnosis not present

## 2019-06-27 DIAGNOSIS — J9601 Acute respiratory failure with hypoxia: Secondary | ICD-10-CM | POA: Diagnosis not present

## 2019-06-27 DIAGNOSIS — E1122 Type 2 diabetes mellitus with diabetic chronic kidney disease: Secondary | ICD-10-CM | POA: Diagnosis not present

## 2019-06-27 DIAGNOSIS — E1151 Type 2 diabetes mellitus with diabetic peripheral angiopathy without gangrene: Secondary | ICD-10-CM | POA: Diagnosis not present

## 2019-06-27 DIAGNOSIS — E1140 Type 2 diabetes mellitus with diabetic neuropathy, unspecified: Secondary | ICD-10-CM | POA: Diagnosis not present

## 2019-06-27 DIAGNOSIS — N182 Chronic kidney disease, stage 2 (mild): Secondary | ICD-10-CM | POA: Diagnosis not present

## 2019-06-28 ENCOUNTER — Telehealth: Payer: Self-pay

## 2019-06-28 ENCOUNTER — Encounter: Payer: Self-pay | Admitting: Family Medicine

## 2019-06-28 DIAGNOSIS — E1140 Type 2 diabetes mellitus with diabetic neuropathy, unspecified: Secondary | ICD-10-CM | POA: Diagnosis not present

## 2019-06-28 DIAGNOSIS — E1151 Type 2 diabetes mellitus with diabetic peripheral angiopathy without gangrene: Secondary | ICD-10-CM | POA: Diagnosis not present

## 2019-06-28 DIAGNOSIS — I129 Hypertensive chronic kidney disease with stage 1 through stage 4 chronic kidney disease, or unspecified chronic kidney disease: Secondary | ICD-10-CM | POA: Diagnosis not present

## 2019-06-28 DIAGNOSIS — J9601 Acute respiratory failure with hypoxia: Secondary | ICD-10-CM | POA: Diagnosis not present

## 2019-06-28 DIAGNOSIS — E1122 Type 2 diabetes mellitus with diabetic chronic kidney disease: Secondary | ICD-10-CM | POA: Diagnosis not present

## 2019-06-28 DIAGNOSIS — N182 Chronic kidney disease, stage 2 (mild): Secondary | ICD-10-CM | POA: Diagnosis not present

## 2019-06-28 NOTE — Telephone Encounter (Signed)
Copied from [place] [phone]. Topic: General - Inquiry >> Jun 28, 2019  9:22 AM Richardo Priest, [place] wrote: Reason for CRM: [place], OT, calling in stating patient fell last week, Friday July 10th. Some minor bruising occurred. Wanted to inform PCP just in case. Call back is [phone].

## 2019-07-02 DIAGNOSIS — E1122 Type 2 diabetes mellitus with diabetic chronic kidney disease: Secondary | ICD-10-CM | POA: Diagnosis not present

## 2019-07-02 DIAGNOSIS — E1151 Type 2 diabetes mellitus with diabetic peripheral angiopathy without gangrene: Secondary | ICD-10-CM | POA: Diagnosis not present

## 2019-07-02 DIAGNOSIS — E1140 Type 2 diabetes mellitus with diabetic neuropathy, unspecified: Secondary | ICD-10-CM | POA: Diagnosis not present

## 2019-07-02 DIAGNOSIS — I129 Hypertensive chronic kidney disease with stage 1 through stage 4 chronic kidney disease, or unspecified chronic kidney disease: Secondary | ICD-10-CM | POA: Diagnosis not present

## 2019-07-02 DIAGNOSIS — N182 Chronic kidney disease, stage 2 (mild): Secondary | ICD-10-CM | POA: Diagnosis not present

## 2019-07-02 DIAGNOSIS — J9601 Acute respiratory failure with hypoxia: Secondary | ICD-10-CM | POA: Diagnosis not present

## 2019-07-03 ENCOUNTER — Other Ambulatory Visit (INDEPENDENT_AMBULATORY_CARE_PROVIDER_SITE_OTHER): Payer: Medicare Other

## 2019-07-03 DIAGNOSIS — D649 Anemia, unspecified: Secondary | ICD-10-CM

## 2019-07-04 ENCOUNTER — Encounter: Payer: Self-pay | Admitting: Family Medicine

## 2019-07-04 LAB — FECAL OCCULT BLOOD, IMMUNOCHEMICAL: Fecal Occult Bld: POSITIVE — AB

## 2019-07-04 NOTE — Progress Notes (Signed)
Received + ifob Colonoscopy 10/18 Message to pt- please schedule a follow-up with GI Doc, Matthew Nash

## 2019-07-05 ENCOUNTER — Encounter: Payer: Self-pay | Admitting: Family Medicine

## 2019-07-09 DIAGNOSIS — I129 Hypertensive chronic kidney disease with stage 1 through stage 4 chronic kidney disease, or unspecified chronic kidney disease: Secondary | ICD-10-CM | POA: Diagnosis not present

## 2019-07-09 DIAGNOSIS — J9601 Acute respiratory failure with hypoxia: Secondary | ICD-10-CM | POA: Diagnosis not present

## 2019-07-09 DIAGNOSIS — E1151 Type 2 diabetes mellitus with diabetic peripheral angiopathy without gangrene: Secondary | ICD-10-CM | POA: Diagnosis not present

## 2019-07-09 DIAGNOSIS — E1122 Type 2 diabetes mellitus with diabetic chronic kidney disease: Secondary | ICD-10-CM | POA: Diagnosis not present

## 2019-07-09 DIAGNOSIS — E1140 Type 2 diabetes mellitus with diabetic neuropathy, unspecified: Secondary | ICD-10-CM | POA: Diagnosis not present

## 2019-07-09 DIAGNOSIS — N182 Chronic kidney disease, stage 2 (mild): Secondary | ICD-10-CM | POA: Diagnosis not present

## 2019-07-10 ENCOUNTER — Emergency Department (HOSPITAL_BASED_OUTPATIENT_CLINIC_OR_DEPARTMENT_OTHER)
Admission: EM | Admit: 2019-07-10 | Discharge: 2019-07-10 | Disposition: A | Discharge status: Home / Self Care | Payer: Medicare Other | Attending: Emergency Medicine | Admitting: Emergency Medicine

## 2019-07-10 ENCOUNTER — Encounter (HOSPITAL_BASED_OUTPATIENT_CLINIC_OR_DEPARTMENT_OTHER): Payer: Self-pay | Admitting: Emergency Medicine

## 2019-07-10 ENCOUNTER — Other Ambulatory Visit: Payer: Self-pay

## 2019-07-10 ENCOUNTER — Emergency Department (HOSPITAL_BASED_OUTPATIENT_CLINIC_OR_DEPARTMENT_OTHER): Payer: Medicare Other

## 2019-07-10 ENCOUNTER — Encounter: Payer: Self-pay | Admitting: Family Medicine

## 2019-07-10 DIAGNOSIS — Z87891 Personal history of nicotine dependence: Secondary | ICD-10-CM | POA: Diagnosis not present

## 2019-07-10 DIAGNOSIS — W19XXXA Unspecified fall, initial encounter: Secondary | ICD-10-CM

## 2019-07-10 DIAGNOSIS — R197 Diarrhea, unspecified: Secondary | ICD-10-CM | POA: Insufficient documentation

## 2019-07-10 DIAGNOSIS — Z7984 Long term (current) use of oral hypoglycemic drugs: Secondary | ICD-10-CM | POA: Insufficient documentation

## 2019-07-10 DIAGNOSIS — E119 Type 2 diabetes mellitus without complications: Secondary | ICD-10-CM | POA: Diagnosis not present

## 2019-07-10 DIAGNOSIS — S3991XA Unspecified injury of abdomen, initial encounter: Secondary | ICD-10-CM | POA: Diagnosis not present

## 2019-07-10 DIAGNOSIS — Z96652 Presence of left artificial knee joint: Secondary | ICD-10-CM | POA: Diagnosis not present

## 2019-07-10 LAB — CBC WITH DIFFERENTIAL/PLATELET
Abs Immature Granulocytes: 0.01 10*3/uL (ref 0.00–0.07)
Basophils Absolute: 0 10*3/uL (ref 0.0–0.1)
Basophils Relative: 0 %
Eosinophils Absolute: 0.1 10*3/uL (ref 0.0–0.5)
Eosinophils Relative: 3 %
HCT: 41 % (ref 39.0–52.0)
Hemoglobin: 12.3 g/dL — ABNORMAL LOW (ref 13.0–17.0)
Immature Granulocytes: 0 %
Lymphocytes Relative: 18 %
Lymphs Abs: 1 10*3/uL (ref 0.7–4.0)
MCH: 27.8 pg (ref 26.0–34.0)
MCHC: 30 g/dL (ref 30.0–36.0)
MCV: 92.6 fL (ref 80.0–100.0)
Monocytes Absolute: 0.5 10*3/uL (ref 0.1–1.0)
Monocytes Relative: 8 %
Neutro Abs: 3.9 10*3/uL (ref 1.7–7.7)
Neutrophils Relative %: 71 %
Platelets: 261 10*3/uL (ref 150–400)
RBC: 4.43 MIL/uL (ref 4.22–5.81)
RDW: 14.6 % (ref 11.5–15.5)
WBC: 5.5 10*3/uL (ref 4.0–10.5)
nRBC: 0 % (ref 0.0–0.2)

## 2019-07-10 LAB — C DIFFICILE QUICK SCREEN W PCR REFLEX
C Diff antigen: NEGATIVE
C Diff interpretation: NOT DETECTED
C Diff toxin: NEGATIVE

## 2019-07-10 LAB — COMPREHENSIVE METABOLIC PANEL
ALT: 15 U/L (ref 0–44)
AST: 13 U/L — ABNORMAL LOW (ref 15–41)
Albumin: 3.7 g/dL (ref 3.5–5.0)
Alkaline Phosphatase: 38 U/L (ref 38–126)
Anion gap: 9 (ref 5–15)
BUN: 15 mg/dL (ref 8–23)
CO2: 29 mmol/L (ref 22–32)
Calcium: 9.6 mg/dL (ref 8.9–10.3)
Chloride: 105 mmol/L (ref 98–111)
Creatinine, Ser: 1.05 mg/dL (ref 0.61–1.24)
GFR calc Af Amer: >60 mL/min (ref >60)
GFR calc non Af Amer: >60 mL/min (ref >60)
Glucose, Bld: 185 mg/dL — ABNORMAL HIGH (ref 70–99)
Potassium: 4 mmol/L (ref 3.5–5.1)
Sodium: 143 mmol/L (ref 135–145)
Total Bilirubin: 0.7 mg/dL (ref 0.3–1.2)
Total Protein: 6.4 g/dL — ABNORMAL LOW (ref 6.5–8.1)

## 2019-07-10 LAB — GASTROINTESTINAL PANEL BY PCR, STOOL (REPLACES STOOL CULTURE)

## 2019-07-10 MED ORDER — LOPERAMIDE HCL 2 MG PO CAPS
2.0000 mg | ORAL_CAPSULE | Freq: Once | ORAL | Status: AC
  Administered 2019-07-10: 2 mg via ORAL
  Filled 2019-07-10: qty 1

## 2019-07-10 MED ORDER — LOPERAMIDE HCL 2 MG PO CAPS: 2.0000 mg | ORAL_CAPSULE | Freq: Four times a day (QID) | ORAL | 0 refills | Status: DC | PRN

## 2019-07-10 NOTE — Discharge Instructions (Addendum)
In the setting of diarrhea, I would hold your Miralax for now and follow up with your GI or PCP doctor for further instruction

## 2019-07-10 NOTE — ED Triage Notes (Signed)
Pt reports watery diarrhea that started tonight. Pt was going to bathroom and sustained mechanical fall due to not using his walker. Pt has 2 abrasions on his back from fall. Denies any pain.

## 2019-07-10 NOTE — ED Provider Notes (Signed)
Emergency Department Provider Note   I have reviewed the triage vital signs and the nursing notes.   HISTORY  Chief Complaint Diarrhea   HPI Matthew Nash is a 83 y.o. male with a long history of GI symptoms for last couple years.  Patient states that it was constipation so started on MiraLAX and then he was having loose stools for a while but continued the MiraLAX but now today has had multiple episodes of watery diarrhea.  He is on Keflex for self-catheterization.  Today he had a episode where he twisted and his foot got stuck on the floor and he fell because he did not have his walker.  On the floor he had profuse watery diarrhea. No s yncope, vomiting or severe headache. No neuro changes. EMS was called and brought him here for further evaluation.  Patient states that he does not have any pain anywhere aside from some scratches on his back.  He is more worried about the diarrhea which is at 5 episodes today.  He is also decreased appetite and mild abdominal bloating.  No urinary symptoms.  No fever no weakness no fatigue.  No sick contacts.  No suspicious food intake.   No other associated or modifying symptoms.    Past Medical History:  Diagnosis Date  . Arthritis    both knees  . Back pain at L4-L5 level   . Basal cell carcinoma   . BPH (benign prostatic hyperplasia)    hx s/p turp  . Chronic back pain    spondylosis  . Chronic kidney disease    kidney stones 1979  . DDD (degenerative disc disease), lumbar   . Diabetes mellitus    takes Metformin and Victoza daily-under control  . Diarrhea    x 1 today  . Diverticulitis   . Gallstones   . GERD (gastroesophageal reflux disease)    takes Omeprazole daily-under control  . H/O hiatal hernia   . H/O measles   . Hepatitis B    B-with Mono and jaundice 1960  . History of chicken pox   . History of colon polyps   . History of kidney stones   . Hyperlipidemia    takes Simvastatin and Tricor daily-under control  .  Hypertension    takes Lisinopril daily-under control  . Joint pain    both knees  . Joint swelling    rt knee  . Kidney stones   . Melanoma ([place])    right hand;basal cell carcinoma  . Neuropathy   . Peripheral vascular disease ([place])    diabetic neuropathy  . Pneumonia    hx of  . Prostate infection    currently taking macrobid  . Sleep apnea with use of continuous positive airway pressure (CPAP)   . Tinnitus   . Urinary incontinence   . UTI (lower urinary tract infection)     Patient Active Problem List   Diagnosis Date Noted  . HTN (hypertension) 06/07/2019  . AKI (acute kidney injury) ([place]) 06/07/2019  . Elevated troponin 06/07/2019  . Acute respiratory failure with hypoxia ([place]) 06/06/2019  . Chronic kidney disease 11/25/2018  . Ischemic chest pain ([place]) 11/25/2018  . Chest pain 11/25/2018  . Pelvic floor dysfunction 04/04/2018  . Change in bowel habits 04/04/2018  . OSA on CPAP 01/12/2018  . REM behavioral disorder 01/12/2018  . Diarrhea 09/15/2017  . Incontinence of feces 09/15/2017  . Peripheral neuropathy 10/20/2016  . Bladder outlet obstruction 05/02/2016  . OA (osteoarthritis)  of knee 05/27/2015  . Diabetes mellitus type II, controlled ([place]) 11/26/2014  . Gastroesophageal reflux disease without esophagitis 11/26/2014  . Arthritis of both knees 11/26/2014  . Absence of bladder continence 11/26/2014  . Hyperlipidemia LDL goal <100 11/26/2014  . Acute medial meniscal tear 07/16/2014  . Lumbosacral spondylosis without myelopathy 02/17/2014  . Spondylosis, lumbosacral 02/17/2014    Past Surgical History:  Procedure Laterality Date  . BASAL CELL CARCINOMA EXCISION    . BOTOX INJECTION  03/22/2017   In the bladder for nightly incontinence.  . C5 and T1 bone chip removed   1986  . [place]  . CHOLECYSTECTOMY  1993  . COLONOSCOPY  2011  . cortisone injection    . EYE SURGERY     cataract sx 2017 bilateral  . I&D left knee Left 1958    states 22 times  . KNEE ARTHROSCOPY Right 07/16/2014   Procedure: RIGHT ARTHROSCOPY KNEE WITH Medial and Lateral DEBRIDEMENT and chondroplasty;  Surgeon: Gearlean Alf, MD;  Location: [facility];  Service: Orthopedics;  Laterality: Right;  . LUMBAR LAMINECTOMY/DECOMPRESSION MICRODISCECTOMY Right 02/17/2014   Procedure: RIGHT LUMBAR TWO-THREE LAMINECTOMY;  Surgeon: Charlie Pitter, MD;  Location: [facility];  Service: Neurosurgery;  Laterality: Right;  right   . right shoulder surgery  2012   rotator cuff repair  . SHOULDER ARTHROSCOPY W/ ROTATOR CUFF REPAIR Left NOV 2010  . TONSILLECTOMY  1942  . TOTAL KNEE ARTHROPLASTY Right 05/27/2015   Procedure: RIGHT TOTAL KNEE ARTHROPLASTY;  Surgeon: Gaynelle Arabian, MD;  Location: [facility];  Service: Orthopedics;  Laterality: Right;  . TOTAL KNEE ARTHROPLASTY Left 05/30/2016   Procedure: LEFT TOTAL KNEE ARTHROPLASTY;  Surgeon: Gaynelle Arabian, MD;  Location: [facility];  Service: Orthopedics;  Laterality: Left;  . TRANSURETHRAL RESECTION OF PROSTATE  2006  . VASECTOMY  1983  . WISDOM TOOTH EXTRACTION      Current Outpatient Rx  . Order #: [id number] Class: Historical Med  . Order #: [id number] Class: Historical Med  . Order #: [id number] Class: Normal  . Order #: [id number] Class: Historical Med  . Order #: [id number] Class: Historical Med  . Order #: [id number] Class: Print  . Order #: [id number] Class: Historical Med  . Order #: [id number] Class: Historical Med  . Order #: [id number] Class: Historical Med  . Order #: [id number] Class: Normal  . Order #: [id number] Class: Historical Med  . Order #: [id number] Class: Historical Med  . Order #: [id number] Class: Normal  . Order #: [id number] Class: Normal  . Order #: [id number] Class: Historical Med    Allergies Sulfamethoxazole-trimethoprim, Albamycin [novobiocin], and Penicillins  Family History  Problem Relation Age of Onset  . Heart disease Mother 65       Deceased  . Bladder Cancer Mother   . Breast cancer Mother   .  Prostate cancer Father        Deceased-in 72s  . Healthy Brother   . Skin cancer Brother        "basal cell cancer in the bloodstream"  . Asthma Sister        Deceased  . Healthy Son        #1  . Healthy Daughter        #2  . Obesity Son        #2    Social History Social History   Tobacco Use  . Smoking status: Former Smoker    Types: Pipe    Quit date: 07/14/1978    Years since quitting: 41.0  . Smokeless tobacco:  Never Used  . Tobacco comment: quit smoking 1979  Substance Use Topics  . Alcohol use: Yes    Comment: OCCASIONAL  . Drug use: No    Review of Systems  All other systems negative except as documented in the HPI. All pertinent positives and negatives as reviewed in the HPI. ____________________________________________   PHYSICAL EXAM:  VITAL SIGNS: ED Triage Vitals  Enc Vitals Group     BP 07/10/19 0121 111/85     Pulse Rate 07/10/19 0121 86     Resp 07/10/19 0121 16     Temp 07/10/19 0121 97.8 F (36.6 C)     Temp Source 07/10/19 0121 Oral     SpO2 07/10/19 0121 95 %     Weight 07/10/19 0121 231 lb (104.8 kg)     Height 07/10/19 0121 5' 10.5" (1.791 m)    Constitutional: Alert and oriented. Well appearing and in no acute distress. Eyes: Conjunctivae are normal. PERRL. EOMI. Head: Atraumatic. Nose: No congestion/rhinnorhea. Mouth/Throat: Mucous membranes are moist.  Oropharynx non-erythematous. Neck: No stridor.  No meningeal signs.   Cardiovascular: Normal rate, regular rhythm. Good peripheral circulation. Grossly normal heart sounds.   Respiratory: Normal respiratory effort.  No retractions. Lungs CTAB. Gastrointestinal: Soft and nontender. No distention.  Musculoskeletal: No cervical spine tenderness, thoracic spine tenderness or Lumbar spine tenderness.  No tenderness or pain with palpation and full ROM of all joints in upper and lower extremities.  No ecchymosis or other signs of trauma on back or extremities.  No Pain with AP or lateral  compression of ribs.  Paracervical ttp, paraspinal ttp Neurologic:  Normal speech and language. No gross focal neurologic deficits are appreciated.  Skin:  Skin is warm, dry and intact. No rash noted. Developing ecchymotic area and abrasion to left mid back.   ____________________________________________   LABS (all labs ordered are listed, but only abnormal results are displayed)  Labs Reviewed  CBC WITH DIFFERENTIAL/PLATELET - Abnormal; Notable for the following components:      Result Value   Hemoglobin 12.3 (*)    All other components within normal limits  COMPREHENSIVE METABOLIC PANEL - Abnormal; Notable for the following components:   Glucose, Bld 185 (*)    Total Protein 6.4 (*)    AST 13 (*)    All other components within normal limits  C DIFFICILE QUICK SCREEN W PCR REFLEX  GASTROINTESTINAL PANEL BY PCR, STOOL (REPLACES STOOL CULTURE)   ____________________________________________  INITIAL IMPRESSION / ASSESSMENT AND PLAN / ED COURSE  Will eval for causes of diarrhea but likely related to use of miralax. Will also check electrolytes. No indication for traumatic workup at this time.   Stool collected, will engage PCP to follow up. Diarrhea improved. Stable for dc.     Pertinent labs & imaging results that were available during my care of the patient were reviewed by me and considered in my medical decision making (see chart for details).  A medical screening exam was performed and I feel the patient has had an appropriate workup for their chief complaint at this time and likelihood of emergent condition existing is low. They have been counseled on decision, discharge, follow up and which symptoms necessitate immediate return to the emergency department. They or their family verbally stated understanding and agreement with plan and discharged in stable condition.   ____________________________________________  FINAL CLINICAL IMPRESSION(S) / ED DIAGNOSES  Final  diagnoses:  Diarrhea, unspecified type  Fall, initial encounter     MEDICATIONS GIVEN  DURING THIS VISIT:  Medications  loperamide (IMODIUM) capsule 2 mg (2 mg Oral Given 07/10/19 0335)     NEW OUTPATIENT MEDICATIONS STARTED DURING THIS VISIT:  Discharge Medication List as of 07/10/2019  4:37 AM    START taking these medications   Details  loperamide (IMODIUM) 2 MG capsule Take 1 capsule (2 mg total) by mouth 4 (four) times daily as needed for diarrhea or loose stools., Starting Wed 07/10/2019, Print        Note:  This note was prepared with assistance of Dragon voice recognition software. Occasional wrong-word or sound-a-like substitutions may have occurred due to the inherent limitations of voice recognition software.   Cesare Sumlin, Corene Cornea, MD 07/10/19 [id number]

## 2019-07-12 DIAGNOSIS — N182 Chronic kidney disease, stage 2 (mild): Secondary | ICD-10-CM | POA: Diagnosis not present

## 2019-07-12 DIAGNOSIS — E1122 Type 2 diabetes mellitus with diabetic chronic kidney disease: Secondary | ICD-10-CM | POA: Diagnosis not present

## 2019-07-12 DIAGNOSIS — E1151 Type 2 diabetes mellitus with diabetic peripheral angiopathy without gangrene: Secondary | ICD-10-CM | POA: Diagnosis not present

## 2019-07-12 DIAGNOSIS — I129 Hypertensive chronic kidney disease with stage 1 through stage 4 chronic kidney disease, or unspecified chronic kidney disease: Secondary | ICD-10-CM | POA: Diagnosis not present

## 2019-07-12 DIAGNOSIS — J9601 Acute respiratory failure with hypoxia: Secondary | ICD-10-CM | POA: Diagnosis not present

## 2019-07-12 DIAGNOSIS — E1140 Type 2 diabetes mellitus with diabetic neuropathy, unspecified: Secondary | ICD-10-CM | POA: Diagnosis not present

## 2019-07-13 DIAGNOSIS — R339 Retention of urine, unspecified: Secondary | ICD-10-CM | POA: Diagnosis not present

## 2019-07-13 DIAGNOSIS — I129 Hypertensive chronic kidney disease with stage 1 through stage 4 chronic kidney disease, or unspecified chronic kidney disease: Secondary | ICD-10-CM | POA: Diagnosis not present

## 2019-07-13 DIAGNOSIS — E1151 Type 2 diabetes mellitus with diabetic peripheral angiopathy without gangrene: Secondary | ICD-10-CM | POA: Diagnosis not present

## 2019-07-13 DIAGNOSIS — E785 Hyperlipidemia, unspecified: Secondary | ICD-10-CM | POA: Diagnosis not present

## 2019-07-13 DIAGNOSIS — J9601 Acute respiratory failure with hypoxia: Secondary | ICD-10-CM | POA: Diagnosis not present

## 2019-07-13 DIAGNOSIS — E1122 Type 2 diabetes mellitus with diabetic chronic kidney disease: Secondary | ICD-10-CM | POA: Diagnosis not present

## 2019-07-13 DIAGNOSIS — E1140 Type 2 diabetes mellitus with diabetic neuropathy, unspecified: Secondary | ICD-10-CM | POA: Diagnosis not present

## 2019-07-13 DIAGNOSIS — Z7982 Long term (current) use of aspirin: Secondary | ICD-10-CM | POA: Diagnosis not present

## 2019-07-13 DIAGNOSIS — Z7984 Long term (current) use of oral hypoglycemic drugs: Secondary | ICD-10-CM | POA: Diagnosis not present

## 2019-07-13 DIAGNOSIS — K219 Gastro-esophageal reflux disease without esophagitis: Secondary | ICD-10-CM | POA: Diagnosis not present

## 2019-07-13 DIAGNOSIS — G4733 Obstructive sleep apnea (adult) (pediatric): Secondary | ICD-10-CM | POA: Diagnosis not present

## 2019-07-13 DIAGNOSIS — N182 Chronic kidney disease, stage 2 (mild): Secondary | ICD-10-CM | POA: Diagnosis not present

## 2019-07-14 NOTE — Progress Notes (Signed)
[place] [facility] [address], [place], [place] [address] [phone]  Date:  07/15/2019   Name:  Matthew Nash   DOB:  [birth year]   MRN:  [id number]  PCP:  Darreld Mclean, MD    Chief Complaint: No chief complaint on file.   History of Present Illness:  Matthew Nash is a 83 y.o. very pleasant male patient who presents with the following:  Telephone follow-up visit today for this pt with history of DM, recurrent diarrhea, neuropathy, HTN Patient location is home, provider location is office.  Patient identity is confirmed with 2 factors, he gives consent for virtual visit today  He was seen in the ER on 7/29 with concern of diarrhea and fall.  He hit his back on the doorframe. He has some bruising but seems to be ok from his fall  He did bump his head when he fell but not hard, no headaches He was evaluated and released to home Labs from that visit were benign Stool culture negative, C diff negative while at hospital  He is holding lisinopril right now as his blood pressures running low-see below  BP Readings from Last 3 Encounters:  07/10/19 124/68  06/13/19 90/60  06/11/19 118/82   He sent [place] vitals signs from earlier today Wt  212.8 BP  111/67 Pulse  84 Temp 97.4 Blood sugar (fasting) 107 He had a BM yesterday - just a small amount, small amount again today Diarrhea is now resolved No belly pain or cramping  He is eating pretty well, no vomiting  Wt Readings from Last 3 Encounters:  07/10/19 231 lb (104.8 kg)  06/13/19 231 lb (104.8 kg)  06/10/19 225 lb 15.5 oz (102.5 kg)   Pt reports he was not actually weighed at the ER so we are not sure what his weight might have been  He does think he has lost some weight however  Lab Results  Component Value Date   HGBA1C 6.8 (H) 06/08/2019   He has an appt with his GI doc on 8/27- he is now seeing PA-C Maretta Los and Dr. Rolm Bookbinder with [facility]  gastroenterology  Patient Active Problem List   Diagnosis Date Noted  . HTN (hypertension) 06/07/2019  . AKI (acute kidney injury) ([place]) 06/07/2019  . Elevated troponin 06/07/2019  . Acute respiratory failure with hypoxia ([place]) 06/06/2019  . Chronic kidney disease 11/25/2018  . Ischemic chest pain ([place]) 11/25/2018  . Chest pain 11/25/2018  . Pelvic floor dysfunction 04/04/2018  . Change in bowel habits 04/04/2018  . OSA on CPAP 01/12/2018  . REM behavioral disorder 01/12/2018  . Diarrhea 09/15/2017  . Incontinence of feces 09/15/2017  . Peripheral neuropathy 10/20/2016  . Bladder outlet obstruction 05/02/2016  . OA (osteoarthritis) of knee 05/27/2015  . Diabetes mellitus type II, controlled ([place]) 11/26/2014  . Gastroesophageal reflux disease without esophagitis 11/26/2014  . Arthritis of both knees 11/26/2014  . Absence of bladder continence 11/26/2014  . Hyperlipidemia LDL goal <100 11/26/2014  . Acute medial meniscal tear 07/16/2014  . Lumbosacral spondylosis without myelopathy 02/17/2014  . Spondylosis, lumbosacral 02/17/2014    Past Medical History:  Diagnosis Date  . Arthritis    both knees  . Back pain at L4-L5 level   . Basal cell carcinoma   . BPH (benign prostatic hyperplasia)    hx s/p turp  . Chronic back pain    spondylosis  . Chronic kidney  disease    kidney stones 1979  . DDD (degenerative disc disease), lumbar   . Diabetes mellitus    takes Metformin and Victoza daily-under control  . Diarrhea    x 1 today  . Diverticulitis   . Gallstones   . GERD (gastroesophageal reflux disease)    takes Omeprazole daily-under control  . H/O hiatal hernia   . H/O measles   . Hepatitis B    B-with Mono and jaundice 1960  . History of chicken pox   . History of colon polyps   . History of kidney stones   . Hyperlipidemia    takes Simvastatin and Tricor daily-under control  . Hypertension    takes Lisinopril daily-under control  . Joint pain    both knees   . Joint swelling    rt knee  . Kidney stones   . Melanoma ([place])    right hand;basal cell carcinoma  . Neuropathy   . Peripheral vascular disease ([place])    diabetic neuropathy  . Pneumonia    hx of  . Prostate infection    currently taking macrobid  . Sleep apnea with use of continuous positive airway pressure (CPAP)   . Tinnitus   . Urinary incontinence   . UTI (lower urinary tract infection)     Past Surgical History:  Procedure Laterality Date  . BASAL CELL CARCINOMA EXCISION    . BOTOX INJECTION  03/22/2017   In the bladder for nightly incontinence.  . C5 and T1 bone chip removed   1986  . [place]  . CHOLECYSTECTOMY  1993  . COLONOSCOPY  2011  . cortisone injection    . EYE SURGERY     cataract sx 2017 bilateral  . I&D left knee Left 1958   states 22 times  . KNEE ARTHROSCOPY Right 07/16/2014   Procedure: RIGHT ARTHROSCOPY KNEE WITH Medial and Lateral DEBRIDEMENT and chondroplasty;  Surgeon: Gearlean Alf, MD;  [facility];  Service: Orthopedics;  Laterality: Right;  . LUMBAR LAMINECTOMY/DECOMPRESSION MICRODISCECTOMY Right 02/17/2014   Procedure: RIGHT LUMBAR TWO-THREE LAMINECTOMY;  Surgeon: Charlie Pitter, MD;  Location: [place] [facility];  Service: Neurosurgery;  Laterality: Right;  right   . right shoulder surgery  2012   rotator cuff repair  . SHOULDER ARTHROSCOPY W/ ROTATOR CUFF REPAIR Left NOV 2010  . TONSILLECTOMY  1942  . TOTAL KNEE ARTHROPLASTY Right 05/27/2015   Procedure: RIGHT TOTAL KNEE ARTHROPLASTY;  Surgeon: Gaynelle Arabian, MD;  [facility];  Service: Orthopedics;  Laterality: Right;  . TOTAL KNEE ARTHROPLASTY Left 05/30/2016   Procedure: LEFT TOTAL KNEE ARTHROPLASTY;  Surgeon: Gaynelle Arabian, MD;  [facility];  Service: Orthopedics;  Laterality: Left;  . TRANSURETHRAL RESECTION OF PROSTATE  2006  . VASECTOMY  1983  . WISDOM TOOTH EXTRACTION      Social History   Tobacco Use  . Smoking status: Former Smoker    Types: Pipe     Quit date: 07/14/1978    Years since quitting: 41.0  . Smokeless tobacco: Never Used  . Tobacco comment: quit smoking 1979  Substance Use Topics  . Alcohol use: Yes    Comment: OCCASIONAL  . Drug use: No    Family History  Problem Relation Age of Onset  . Heart disease Mother 60       Deceased  . Bladder Cancer Mother   . Breast cancer Mother   . Prostate cancer Father  Deceased-in 90s  . Healthy Brother   . Skin cancer Brother        "basal cell cancer in the bloodstream"  . Asthma Sister        Deceased  . Healthy Son        #1  . Healthy Daughter        #2  . Obesity Son        #2    Allergies  Allergen Reactions  . Sulfamethoxazole-Trimethoprim Diarrhea    Severe diarrhea  . Albamycin [Novobiocin] Itching and Rash  . Penicillins Rash and Other (See Comments)    Has patient had a PCN reaction causing immediate rash, facial/tongue/throat swelling, SOB or lightheadedness with hypotension:  no Has patient had a PCN reaction causing severe rash involving mucus membranes or skin necrosis: no Has patient had a PCN reaction that required hospitalization no Has patient had a PCN reaction occurring within the last 10 years: no - childhood reaction If all of the above answers are "NO", then may proceed with Cephalosporin use.     Medication list has been reviewed and updated.  Current Outpatient Medications on File Prior to Visit  Medication Sig Dispense Refill  . aspirin EC 81 MG tablet Take 81 mg by mouth daily.    [name] Kitchen CALCIUM-MAGNESIUM-VITAMIN D PO Take 2 tablets by mouth 2 (two) times daily.     . clonazePAM (KLONOPIN) 0.5 MG tablet Take 1 tablet (0.5 mg total) by mouth at bedtime. (Patient taking differently: Take 0.25 mg by mouth at bedtime. ) 90 tablet 0  . FIBER PO Take 3 capsules by mouth 2 (two) times daily.     [name] Kitchen lisinopril (ZESTRIL) 5 MG tablet Take 5 mg by mouth daily.    [name] Kitchen loperamide (IMODIUM) 2 MG capsule Take 1 capsule (2 mg total) by mouth 4  (four) times daily as needed for diarrhea or loose stools. 12 capsule 0  . Melatonin 10 MG TABS Take 1 tablet by mouth at bedtime.    . metFORMIN (GLUCOPHAGE) 500 MG tablet Take 500 mg by mouth 2 (two) times daily with a meal.    . Multiple Vitamin (MULTI-VITAMINS) TABS Take 1 tablet by mouth daily.     [name] Kitchen omeprazole (PRILOSEC) 20 MG capsule TAKE 1 CAPSULE DAILY (Patient taking differently: Take 20 mg by mouth every morning. ) 90 capsule 3  . Polyethylene Glycol 3350 (MIRALAX PO) Take 17 g by mouth daily.     . Probiotic Product (PROBIOTIC ADVANCED PO) Take 1 tablet by mouth daily.     . simvastatin (ZOCOR) 10 MG tablet TAKE 1 TABLET AT BEDTIME (Patient taking differently: Take 10 mg by mouth at bedtime. ) 90 tablet 1  . TRICOR 145 MG tablet TAKE 1 TABLET EVERY EVENING 90 tablet 4  . vitamin C (ASCORBIC ACID) 500 MG tablet Take 500 mg by mouth daily.     No current facility-administered medications on file prior to visit.     Review of Systems:  As per HPI- otherwise negative. No fever chills  Physical Examination: There were no vitals filed for this visit. There were no vitals filed for this visit. There is no height or weight on file to calculate BMI. Ideal Body Weight:    Spoke with patient over the telephone today.  He sounds well, no cough, wheezing, distress is noted  Assessment and Plan:   ICD-10-CM   1. Functional diarrhea  K59.1   2. Hospital discharge follow-up  Z09  Virtual visit today to follow-up from recent ER visit.  Patient was seen in the emergency room on July 29 with diarrhea and a fall.  He did not seem to get injured in the fall, and notes his diarrhea is actually better His stool studies and labs from that visit were reassuring He has GI follow-up Asked [place] to see me in the office in about 1 month for a recheck and labs.  Will let me know if any concerns in the meantime Spoke with patient for about 8 minutes today Follow-up: No follow-ups on file.  No  orders of the defined types were placed in this encounter.  No orders of the defined types were placed in this encounter.   @SIGN @   Asked him to see me in one month for a recheck and he will do so  Signed Lamar Blinks, MD

## 2019-07-15 ENCOUNTER — Ambulatory Visit (INDEPENDENT_AMBULATORY_CARE_PROVIDER_SITE_OTHER): Payer: Medicare Other | Admitting: Family Medicine

## 2019-07-15 ENCOUNTER — Other Ambulatory Visit: Payer: Self-pay

## 2019-07-15 ENCOUNTER — Encounter: Payer: Self-pay | Admitting: Family Medicine

## 2019-07-15 DIAGNOSIS — Z09 Encounter for follow-up examination after completed treatment for conditions other than malignant neoplasm: Secondary | ICD-10-CM | POA: Diagnosis not present

## 2019-07-15 DIAGNOSIS — K591 Functional diarrhea: Secondary | ICD-10-CM | POA: Diagnosis not present

## 2019-07-17 DIAGNOSIS — I129 Hypertensive chronic kidney disease with stage 1 through stage 4 chronic kidney disease, or unspecified chronic kidney disease: Secondary | ICD-10-CM | POA: Diagnosis not present

## 2019-07-17 DIAGNOSIS — E1122 Type 2 diabetes mellitus with diabetic chronic kidney disease: Secondary | ICD-10-CM | POA: Diagnosis not present

## 2019-07-17 DIAGNOSIS — J9601 Acute respiratory failure with hypoxia: Secondary | ICD-10-CM | POA: Diagnosis not present

## 2019-07-17 DIAGNOSIS — N182 Chronic kidney disease, stage 2 (mild): Secondary | ICD-10-CM | POA: Diagnosis not present

## 2019-07-17 DIAGNOSIS — E1151 Type 2 diabetes mellitus with diabetic peripheral angiopathy without gangrene: Secondary | ICD-10-CM | POA: Diagnosis not present

## 2019-07-17 DIAGNOSIS — E1140 Type 2 diabetes mellitus with diabetic neuropathy, unspecified: Secondary | ICD-10-CM | POA: Diagnosis not present

## 2019-07-22 DIAGNOSIS — E1151 Type 2 diabetes mellitus with diabetic peripheral angiopathy without gangrene: Secondary | ICD-10-CM | POA: Diagnosis not present

## 2019-07-22 DIAGNOSIS — E1122 Type 2 diabetes mellitus with diabetic chronic kidney disease: Secondary | ICD-10-CM | POA: Diagnosis not present

## 2019-07-22 DIAGNOSIS — J9601 Acute respiratory failure with hypoxia: Secondary | ICD-10-CM | POA: Diagnosis not present

## 2019-07-22 DIAGNOSIS — N182 Chronic kidney disease, stage 2 (mild): Secondary | ICD-10-CM | POA: Diagnosis not present

## 2019-07-22 DIAGNOSIS — E1140 Type 2 diabetes mellitus with diabetic neuropathy, unspecified: Secondary | ICD-10-CM | POA: Diagnosis not present

## 2019-07-22 DIAGNOSIS — I129 Hypertensive chronic kidney disease with stage 1 through stage 4 chronic kidney disease, or unspecified chronic kidney disease: Secondary | ICD-10-CM | POA: Diagnosis not present

## 2019-07-29 DIAGNOSIS — J9601 Acute respiratory failure with hypoxia: Secondary | ICD-10-CM | POA: Diagnosis not present

## 2019-07-29 DIAGNOSIS — E1122 Type 2 diabetes mellitus with diabetic chronic kidney disease: Secondary | ICD-10-CM | POA: Diagnosis not present

## 2019-07-29 DIAGNOSIS — N182 Chronic kidney disease, stage 2 (mild): Secondary | ICD-10-CM | POA: Diagnosis not present

## 2019-07-29 DIAGNOSIS — E1140 Type 2 diabetes mellitus with diabetic neuropathy, unspecified: Secondary | ICD-10-CM | POA: Diagnosis not present

## 2019-07-29 DIAGNOSIS — E1151 Type 2 diabetes mellitus with diabetic peripheral angiopathy without gangrene: Secondary | ICD-10-CM | POA: Diagnosis not present

## 2019-07-29 DIAGNOSIS — I129 Hypertensive chronic kidney disease with stage 1 through stage 4 chronic kidney disease, or unspecified chronic kidney disease: Secondary | ICD-10-CM | POA: Diagnosis not present

## 2019-08-05 ENCOUNTER — Encounter: Payer: Self-pay | Admitting: Family Medicine

## 2019-08-05 DIAGNOSIS — E1122 Type 2 diabetes mellitus with diabetic chronic kidney disease: Secondary | ICD-10-CM | POA: Diagnosis not present

## 2019-08-05 DIAGNOSIS — I129 Hypertensive chronic kidney disease with stage 1 through stage 4 chronic kidney disease, or unspecified chronic kidney disease: Secondary | ICD-10-CM | POA: Diagnosis not present

## 2019-08-05 DIAGNOSIS — E1151 Type 2 diabetes mellitus with diabetic peripheral angiopathy without gangrene: Secondary | ICD-10-CM | POA: Diagnosis not present

## 2019-08-05 DIAGNOSIS — J9601 Acute respiratory failure with hypoxia: Secondary | ICD-10-CM | POA: Diagnosis not present

## 2019-08-05 DIAGNOSIS — N182 Chronic kidney disease, stage 2 (mild): Secondary | ICD-10-CM | POA: Diagnosis not present

## 2019-08-05 DIAGNOSIS — E1140 Type 2 diabetes mellitus with diabetic neuropathy, unspecified: Secondary | ICD-10-CM | POA: Diagnosis not present

## 2019-08-06 DIAGNOSIS — R159 Full incontinence of feces: Secondary | ICD-10-CM | POA: Diagnosis not present

## 2019-08-06 DIAGNOSIS — K5909 Other constipation: Secondary | ICD-10-CM | POA: Diagnosis not present

## 2019-08-06 DIAGNOSIS — R198 Other specified symptoms and signs involving the digestive system and abdomen: Secondary | ICD-10-CM | POA: Diagnosis not present

## 2019-08-08 DIAGNOSIS — K6289 Other specified diseases of anus and rectum: Secondary | ICD-10-CM | POA: Diagnosis not present

## 2019-08-10 NOTE — Progress Notes (Signed)
[facility] [address], [place], [place] [address] [phone]  Date:  08/14/2019   Name:  Matthew Nash   DOB:  November 03, 1936   MRN:  [id number]  PCP:  Darreld Mclean, MD    Chief Complaint: Diarrhea (follow up) and Flu Vaccine   History of Present Illness:  Matthew Nash is a 83 y.o. very pleasant male patient who presents with the following:  Short term follow-up visit today Seen by myself on 8/3 -at that time he had been seen in the ER with diarrhea Flu shot-  Give today   He saw his GI doc with [facility] last week- saw the PA Matthew Nash; the MD is Dr. Rolm Nash He has a couple of accidents on the way into the office They took him off miralax and he will start PT for pelvic floor dysfunction next week He went to see ENT Dr. Joya Nash recently for joarse voice- they thought this might actually represent parkinson's disease, causing his voice to change due to vocal cord dysfunction  This dx had not been a consideration in the past   His neurologist is Dr. Bland Nash at [facility]- last saw him in July Next visit with him is a month from now We discussed PD and how it might be treated, Matthew Nash wants to see what Dr. Bland Nash thinks first     Patient Active Problem List   Diagnosis Date Noted  . HTN (hypertension) 06/07/2019  . AKI (acute kidney injury) ([place]) 06/07/2019  . Elevated troponin 06/07/2019  . Acute respiratory failure with hypoxia ([place]) 06/06/2019  . Chronic kidney disease 11/25/2018  . Ischemic chest pain ([place]) 11/25/2018  . Chest pain 11/25/2018  . Pelvic floor dysfunction 04/04/2018  . OSA on CPAP 01/12/2018  . REM behavioral disorder 01/12/2018  . Diarrhea 09/15/2017  . Incontinence of feces 09/15/2017  . Peripheral neuropathy 10/20/2016  . Bladder outlet obstruction 05/02/2016  . OA (osteoarthritis) of knee 05/27/2015  . Diabetes mellitus type II, controlled ([place]) 11/26/2014  . Gastroesophageal reflux  disease without esophagitis 11/26/2014  . Arthritis of both knees 11/26/2014  . Absence of bladder continence 11/26/2014  . Hyperlipidemia LDL goal <100 11/26/2014  . Acute medial meniscal tear 07/16/2014  . Lumbosacral spondylosis without myelopathy 02/17/2014  . Spondylosis, lumbosacral 02/17/2014    Past Medical History:  Diagnosis Date  . Arthritis    both knees  . Back pain at L4-L5 level   . Basal cell carcinoma   . BPH (benign prostatic hyperplasia)    hx s/p turp  . Chronic back pain    spondylosis  . Chronic kidney disease    kidney stones 1979  . DDD (degenerative disc disease), lumbar   . Diabetes mellitus    takes Metformin and Victoza daily-under control  . Diarrhea    x 1 today  . Diverticulitis   . Gallstones   . GERD (gastroesophageal reflux disease)    takes Omeprazole daily-under control  . H/O hiatal hernia   . H/O measles   . Hepatitis B    B-with Mono and jaundice 1960  . History of chicken pox   . History of colon polyps   . History of kidney stones   . Hyperlipidemia    takes Simvastatin and Tricor daily-under control  . Hypertension    takes Lisinopril daily-under control  . Joint pain    both knees  . Joint swelling  rt knee  . Kidney stones   . Melanoma ([place])    right hand;basal cell carcinoma  . Neuropathy   . Peripheral vascular disease ([place])    diabetic neuropathy  . Pneumonia    hx of  . Prostate infection    currently taking macrobid  . Sleep apnea with use of continuous positive airway pressure (CPAP)   . Tinnitus   . Urinary incontinence   . UTI (lower urinary tract infection)     Past Surgical History:  Procedure Laterality Date  . BASAL CELL CARCINOMA EXCISION    . BOTOX INJECTION  03/22/2017   In the bladder for nightly incontinence.  . C5 and T1 bone chip removed   1986  . [place]  . CHOLECYSTECTOMY  1993  . COLONOSCOPY  2011  . cortisone injection    . EYE SURGERY     cataract sx 2017  bilateral  . I&D left knee Left 1958   states 22 times  . KNEE ARTHROSCOPY Right 07/16/2014   Procedure: RIGHT ARTHROSCOPY KNEE WITH Medial and Lateral DEBRIDEMENT and chondroplasty;  Surgeon: Gearlean Alf, MD;  Location: [facility];  Service: Orthopedics;  Laterality: Right;  . LUMBAR LAMINECTOMY/DECOMPRESSION MICRODISCECTOMY Right 02/17/2014   Procedure: RIGHT LUMBAR TWO-THREE LAMINECTOMY;  Surgeon: Charlie Pitter, MD;  Location: [facility];  Service: Neurosurgery;  Laterality: Right;  right   . right shoulder surgery  2012   rotator cuff repair  . SHOULDER ARTHROSCOPY W/ ROTATOR CUFF REPAIR Left NOV 2010  . TONSILLECTOMY  1942  . TOTAL KNEE ARTHROPLASTY Right 05/27/2015   Procedure: RIGHT TOTAL KNEE ARTHROPLASTY;  Surgeon: Gaynelle Arabian, MD;  Location: [facility];  Service: Orthopedics;  Laterality: Right;  . TOTAL KNEE ARTHROPLASTY Left 05/30/2016   Procedure: LEFT TOTAL KNEE ARTHROPLASTY;  Surgeon: Gaynelle Arabian, MD;  Location: [facility];  Service: Orthopedics;  Laterality: Left;  . TRANSURETHRAL RESECTION OF PROSTATE  2006  . VASECTOMY  1983  . WISDOM TOOTH EXTRACTION      Social History   Tobacco Use  . Smoking status: Former Smoker    Types: Pipe    Quit date: 07/14/1978    Years since quitting: 41.1  . Smokeless tobacco: Never Used  . Tobacco comment: quit smoking 1979  Substance Use Topics  . Alcohol use: Yes    Comment: OCCASIONAL  . Drug use: No    Family History  Problem Relation Age of Onset  . Heart disease Mother 104       Deceased  . Bladder Cancer Mother   . Breast cancer Mother   . Prostate cancer Father        Deceased-in 27s  . Healthy Brother   . Skin cancer Brother        "basal cell cancer in the bloodstream"  . Asthma Sister        Deceased  . Healthy Son        #1  . Healthy Daughter        #2  . Obesity Son        #2    Allergies  Allergen Reactions  . Sulfamethoxazole-Trimethoprim Diarrhea    Severe diarrhea  . Albamycin [Novobiocin] Itching and  Rash  . Penicillins Rash and Other (See Comments)    Has patient had a PCN reaction causing immediate rash, facial/tongue/throat swelling, SOB or lightheadedness with hypotension:  no Has patient had a PCN reaction causing severe rash involving mucus membranes or skin  necrosis: no Has patient had a PCN reaction that required hospitalization no Has patient had a PCN reaction occurring within the last 10 years: no - childhood reaction If all of the above answers are "NO", then may proceed with Cephalosporin use.     Medication list has been reviewed and updated.  Current Outpatient Medications on File Prior to Visit  Medication Sig Dispense Refill  . Alpha-Lipoic Acid 600 MG TABS Take by mouth.    [name] Kitchen aspirin EC 81 MG tablet Take 81 mg by mouth daily.    [name] Kitchen CALCIUM-MAGNESIUM-VITAMIN D PO Take 2 tablets by mouth 2 (two) times daily.     . clonazePAM (KLONOPIN) 0.5 MG tablet Take 1 tablet (0.5 mg total) by mouth at bedtime. (Patient taking differently: Take 0.25 mg by mouth at bedtime. ) 90 tablet 0  . FIBER PO Take 3 capsules by mouth 2 (two) times daily.     . Melatonin 10 MG TABS Take 1 tablet by mouth at bedtime.    . metFORMIN (GLUCOPHAGE) 500 MG tablet Take 500 mg by mouth 2 (two) times daily with a meal.    . Multiple Vitamin (MULTI-VITAMINS) TABS Take 1 tablet by mouth daily.     [name] Kitchen omeprazole (PRILOSEC) 20 MG capsule TAKE 1 CAPSULE DAILY (Patient taking differently: Take 20 mg by mouth every morning. ) 90 capsule 3  . Polyethylene Glycol 3350 (MIRALAX PO) Take 17 g by mouth daily.     . Probiotic Product (PROBIOTIC ADVANCED PO) Take 1 tablet by mouth daily.     . simvastatin (ZOCOR) 10 MG tablet TAKE 1 TABLET AT BEDTIME (Patient taking differently: Take 10 mg by mouth at bedtime. ) 90 tablet 1  . TRICOR 145 MG tablet TAKE 1 TABLET EVERY EVENING 90 tablet 4  . vitamin C (ASCORBIC ACID) 500 MG tablet Take 500 mg by mouth daily.     No current facility-administered medications on file  prior to visit.     Review of Systems:  As per HPI- otherwise negative. No fever or chills  Physical Examination: Vitals:   08/14/19 1404  BP: 122/82  Pulse: 80  Resp: 19  Temp: (!) 97.5 F (36.4 C)  SpO2: 93%   Vitals:   08/14/19 1404  Weight: 227 lb (103 kg)  Height: 5' 10.5" (1.791 m)   Body mass index is 32.11 kg/m. Ideal Body Weight: Weight in (lb) to have BMI = 25: 176.4  GEN: WDWN, NAD, Non-toxic, A & O x 3, overweight, looks well, his normal self HEENT: Atraumatic, Normocephalic. Neck supple. No masses, No LAD. Ears and Nose: No external deformity. CV: RRR, No M/G/R. No JVD. No thrill. No extra heart sounds. PULM: CTA B, no wheezes, crackles, rhonchi. No retractions. No resp. distress. No accessory muscle use. EXTR: No c/c/e NEURO Normal gait for pt- foot drop, uses a walker  Tremor evident in both hands PSYCH: Normally interactive. Conversant. Not depressed or anxious appearing.  Calm demeanor.    Assessment and Plan: Needs flu shot - Plan: Flu Vaccine QUAD High Dose(Fluad)  Functional diarrhea  Tremor of right hand  Pathologic change of voice  Following up today Given flu shot Discussed possible concern for PD Offered to check a PSA- he prefers to do with next routine labs in December Asked him to see me in December for visit, A1c   Signed Lamar Blinks, MD

## 2019-08-12 DIAGNOSIS — R490 Dysphonia: Secondary | ICD-10-CM | POA: Diagnosis not present

## 2019-08-12 DIAGNOSIS — Z87891 Personal history of nicotine dependence: Secondary | ICD-10-CM | POA: Diagnosis not present

## 2019-08-12 DIAGNOSIS — Z7289 Other problems related to lifestyle: Secondary | ICD-10-CM | POA: Diagnosis not present

## 2019-08-12 DIAGNOSIS — J383 Other diseases of vocal cords: Secondary | ICD-10-CM | POA: Diagnosis not present

## 2019-08-12 DIAGNOSIS — K219 Gastro-esophageal reflux disease without esophagitis: Secondary | ICD-10-CM | POA: Diagnosis not present

## 2019-08-14 ENCOUNTER — Other Ambulatory Visit: Payer: Self-pay

## 2019-08-14 ENCOUNTER — Encounter: Payer: Self-pay | Admitting: Family Medicine

## 2019-08-14 ENCOUNTER — Ambulatory Visit (INDEPENDENT_AMBULATORY_CARE_PROVIDER_SITE_OTHER): Payer: Medicare Other | Admitting: Family Medicine

## 2019-08-14 VITALS — BP 122/82 | HR 80 | Temp 97.5°F | Resp 19 | Ht 70.5 in | Wt 227.0 lb

## 2019-08-14 DIAGNOSIS — R490 Dysphonia: Secondary | ICD-10-CM

## 2019-08-14 DIAGNOSIS — R251 Tremor, unspecified: Secondary | ICD-10-CM

## 2019-08-14 DIAGNOSIS — Z23 Encounter for immunization: Secondary | ICD-10-CM

## 2019-08-14 DIAGNOSIS — K591 Functional diarrhea: Secondary | ICD-10-CM

## 2019-08-14 DIAGNOSIS — R499 Unspecified voice and resonance disorder: Secondary | ICD-10-CM

## 2019-08-14 NOTE — Patient Instructions (Signed)
Great to see you today- please see me in December for A1c check.  We can also do a PSA level at that time if you like

## 2019-08-20 DIAGNOSIS — R198 Other specified symptoms and signs involving the digestive system and abdomen: Secondary | ICD-10-CM | POA: Diagnosis not present

## 2019-08-20 DIAGNOSIS — M6289 Other specified disorders of muscle: Secondary | ICD-10-CM | POA: Diagnosis not present

## 2019-08-20 DIAGNOSIS — K5909 Other constipation: Secondary | ICD-10-CM | POA: Diagnosis not present

## 2019-08-20 DIAGNOSIS — R159 Full incontinence of feces: Secondary | ICD-10-CM | POA: Diagnosis not present

## 2019-08-20 DIAGNOSIS — K623 Rectal prolapse: Secondary | ICD-10-CM | POA: Diagnosis not present

## 2019-08-22 DIAGNOSIS — N3941 Urge incontinence: Secondary | ICD-10-CM | POA: Diagnosis not present

## 2019-08-29 DIAGNOSIS — K5909 Other constipation: Secondary | ICD-10-CM | POA: Diagnosis not present

## 2019-08-29 DIAGNOSIS — K623 Rectal prolapse: Secondary | ICD-10-CM | POA: Diagnosis not present

## 2019-08-29 DIAGNOSIS — R159 Full incontinence of feces: Secondary | ICD-10-CM | POA: Diagnosis not present

## 2019-08-29 DIAGNOSIS — R198 Other specified symptoms and signs involving the digestive system and abdomen: Secondary | ICD-10-CM | POA: Diagnosis not present

## 2019-08-29 DIAGNOSIS — M6289 Other specified disorders of muscle: Secondary | ICD-10-CM | POA: Diagnosis not present

## 2019-09-05 DIAGNOSIS — K623 Rectal prolapse: Secondary | ICD-10-CM | POA: Diagnosis not present

## 2019-09-05 DIAGNOSIS — R159 Full incontinence of feces: Secondary | ICD-10-CM | POA: Diagnosis not present

## 2019-09-05 DIAGNOSIS — M6289 Other specified disorders of muscle: Secondary | ICD-10-CM | POA: Diagnosis not present

## 2019-09-05 DIAGNOSIS — K5909 Other constipation: Secondary | ICD-10-CM | POA: Diagnosis not present

## 2019-09-05 DIAGNOSIS — R198 Other specified symptoms and signs involving the digestive system and abdomen: Secondary | ICD-10-CM | POA: Diagnosis not present

## 2019-09-10 DIAGNOSIS — R159 Full incontinence of feces: Secondary | ICD-10-CM | POA: Diagnosis not present

## 2019-09-10 DIAGNOSIS — R198 Other specified symptoms and signs involving the digestive system and abdomen: Secondary | ICD-10-CM | POA: Diagnosis not present

## 2019-09-10 DIAGNOSIS — K623 Rectal prolapse: Secondary | ICD-10-CM | POA: Diagnosis not present

## 2019-09-10 DIAGNOSIS — K5909 Other constipation: Secondary | ICD-10-CM | POA: Diagnosis not present

## 2019-09-10 DIAGNOSIS — M6289 Other specified disorders of muscle: Secondary | ICD-10-CM | POA: Diagnosis not present

## 2019-09-12 ENCOUNTER — Encounter: Payer: Self-pay | Admitting: Family Medicine

## 2019-09-12 DIAGNOSIS — E1142 Type 2 diabetes mellitus with diabetic polyneuropathy: Secondary | ICD-10-CM | POA: Diagnosis not present

## 2019-09-12 DIAGNOSIS — G4752 REM sleep behavior disorder: Secondary | ICD-10-CM | POA: Diagnosis not present

## 2019-09-12 DIAGNOSIS — R270 Ataxia, unspecified: Secondary | ICD-10-CM | POA: Diagnosis not present

## 2019-09-12 DIAGNOSIS — G5601 Carpal tunnel syndrome, right upper limb: Secondary | ICD-10-CM | POA: Diagnosis not present

## 2019-09-17 DIAGNOSIS — K5909 Other constipation: Secondary | ICD-10-CM | POA: Diagnosis not present

## 2019-09-17 DIAGNOSIS — R198 Other specified symptoms and signs involving the digestive system and abdomen: Secondary | ICD-10-CM | POA: Diagnosis not present

## 2019-09-17 DIAGNOSIS — M6289 Other specified disorders of muscle: Secondary | ICD-10-CM | POA: Diagnosis not present

## 2019-09-17 DIAGNOSIS — K623 Rectal prolapse: Secondary | ICD-10-CM | POA: Diagnosis not present

## 2019-09-17 DIAGNOSIS — R159 Full incontinence of feces: Secondary | ICD-10-CM | POA: Diagnosis not present

## 2019-09-17 DIAGNOSIS — R531 Weakness: Secondary | ICD-10-CM | POA: Diagnosis not present

## 2019-09-18 DIAGNOSIS — R490 Dysphonia: Secondary | ICD-10-CM | POA: Diagnosis not present

## 2019-09-18 DIAGNOSIS — J383 Other diseases of vocal cords: Secondary | ICD-10-CM | POA: Diagnosis not present

## 2019-09-18 DIAGNOSIS — J3801 Paralysis of vocal cords and larynx, unilateral: Secondary | ICD-10-CM | POA: Diagnosis not present

## 2019-09-18 DIAGNOSIS — J385 Laryngeal spasm: Secondary | ICD-10-CM | POA: Diagnosis not present

## 2019-09-20 DIAGNOSIS — R270 Ataxia, unspecified: Secondary | ICD-10-CM | POA: Diagnosis not present

## 2019-09-20 DIAGNOSIS — R519 Headache, unspecified: Secondary | ICD-10-CM | POA: Diagnosis not present

## 2019-09-24 DIAGNOSIS — M6289 Other specified disorders of muscle: Secondary | ICD-10-CM | POA: Diagnosis not present

## 2019-09-24 DIAGNOSIS — R531 Weakness: Secondary | ICD-10-CM | POA: Diagnosis not present

## 2019-09-24 DIAGNOSIS — R159 Full incontinence of feces: Secondary | ICD-10-CM | POA: Diagnosis not present

## 2019-09-24 DIAGNOSIS — K623 Rectal prolapse: Secondary | ICD-10-CM | POA: Diagnosis not present

## 2019-09-24 DIAGNOSIS — K5909 Other constipation: Secondary | ICD-10-CM | POA: Diagnosis not present

## 2019-09-24 DIAGNOSIS — R198 Other specified symptoms and signs involving the digestive system and abdomen: Secondary | ICD-10-CM | POA: Diagnosis not present

## 2019-09-25 DIAGNOSIS — R490 Dysphonia: Secondary | ICD-10-CM | POA: Diagnosis not present

## 2019-09-25 DIAGNOSIS — J3801 Paralysis of vocal cords and larynx, unilateral: Secondary | ICD-10-CM | POA: Diagnosis not present

## 2019-09-25 DIAGNOSIS — J385 Laryngeal spasm: Secondary | ICD-10-CM | POA: Diagnosis not present

## 2019-09-25 DIAGNOSIS — J383 Other diseases of vocal cords: Secondary | ICD-10-CM | POA: Diagnosis not present

## 2019-10-01 DIAGNOSIS — J383 Other diseases of vocal cords: Secondary | ICD-10-CM | POA: Diagnosis not present

## 2019-10-01 DIAGNOSIS — J385 Laryngeal spasm: Secondary | ICD-10-CM | POA: Diagnosis not present

## 2019-10-01 DIAGNOSIS — R490 Dysphonia: Secondary | ICD-10-CM | POA: Diagnosis not present

## 2019-10-01 DIAGNOSIS — J3801 Paralysis of vocal cords and larynx, unilateral: Secondary | ICD-10-CM | POA: Diagnosis not present

## 2019-10-03 ENCOUNTER — Encounter: Payer: Self-pay | Admitting: Family Medicine

## 2019-10-03 DIAGNOSIS — G4700 Insomnia, unspecified: Secondary | ICD-10-CM

## 2019-10-03 DIAGNOSIS — K623 Rectal prolapse: Secondary | ICD-10-CM | POA: Diagnosis not present

## 2019-10-03 DIAGNOSIS — R198 Other specified symptoms and signs involving the digestive system and abdomen: Secondary | ICD-10-CM | POA: Diagnosis not present

## 2019-10-03 DIAGNOSIS — K5909 Other constipation: Secondary | ICD-10-CM | POA: Diagnosis not present

## 2019-10-03 DIAGNOSIS — M6289 Other specified disorders of muscle: Secondary | ICD-10-CM | POA: Diagnosis not present

## 2019-10-03 DIAGNOSIS — R531 Weakness: Secondary | ICD-10-CM | POA: Diagnosis not present

## 2019-10-03 DIAGNOSIS — R159 Full incontinence of feces: Secondary | ICD-10-CM | POA: Diagnosis not present

## 2019-10-03 MED ORDER — CLONAZEPAM 0.5 MG PO TABS: 0.2500 mg | ORAL_TABLET | Freq: Every day | ORAL | 0 refills | Status: DC

## 2019-10-08 DIAGNOSIS — R198 Other specified symptoms and signs involving the digestive system and abdomen: Secondary | ICD-10-CM | POA: Diagnosis not present

## 2019-10-08 DIAGNOSIS — R531 Weakness: Secondary | ICD-10-CM | POA: Diagnosis not present

## 2019-10-08 DIAGNOSIS — R159 Full incontinence of feces: Secondary | ICD-10-CM | POA: Diagnosis not present

## 2019-10-08 DIAGNOSIS — K5909 Other constipation: Secondary | ICD-10-CM | POA: Diagnosis not present

## 2019-10-08 DIAGNOSIS — M6289 Other specified disorders of muscle: Secondary | ICD-10-CM | POA: Diagnosis not present

## 2019-10-08 DIAGNOSIS — K623 Rectal prolapse: Secondary | ICD-10-CM | POA: Diagnosis not present

## 2019-10-09 DIAGNOSIS — J385 Laryngeal spasm: Secondary | ICD-10-CM | POA: Diagnosis not present

## 2019-10-09 DIAGNOSIS — J3801 Paralysis of vocal cords and larynx, unilateral: Secondary | ICD-10-CM | POA: Diagnosis not present

## 2019-10-09 DIAGNOSIS — R490 Dysphonia: Secondary | ICD-10-CM | POA: Diagnosis not present

## 2019-10-09 DIAGNOSIS — J383 Other diseases of vocal cords: Secondary | ICD-10-CM | POA: Diagnosis not present

## 2019-10-16 DIAGNOSIS — R490 Dysphonia: Secondary | ICD-10-CM | POA: Diagnosis not present

## 2019-10-16 DIAGNOSIS — J383 Other diseases of vocal cords: Secondary | ICD-10-CM | POA: Diagnosis not present

## 2019-10-31 DIAGNOSIS — M79641 Pain in right hand: Secondary | ICD-10-CM | POA: Diagnosis not present

## 2019-10-31 DIAGNOSIS — E1340 Other specified diabetes mellitus with diabetic neuropathy, unspecified: Secondary | ICD-10-CM | POA: Diagnosis not present

## 2019-11-02 DIAGNOSIS — J90 Pleural effusion, not elsewhere classified: Secondary | ICD-10-CM | POA: Diagnosis not present

## 2019-11-02 DIAGNOSIS — L89152 Pressure ulcer of sacral region, stage 2: Secondary | ICD-10-CM | POA: Diagnosis not present

## 2019-11-02 DIAGNOSIS — J9601 Acute respiratory failure with hypoxia: Secondary | ICD-10-CM | POA: Diagnosis present

## 2019-11-02 DIAGNOSIS — Z936 Other artificial openings of urinary tract status: Secondary | ICD-10-CM | POA: Diagnosis not present

## 2019-11-02 DIAGNOSIS — G4700 Insomnia, unspecified: Secondary | ICD-10-CM | POA: Diagnosis not present

## 2019-11-02 DIAGNOSIS — D649 Anemia, unspecified: Secondary | ICD-10-CM | POA: Diagnosis not present

## 2019-11-02 DIAGNOSIS — G4752 REM sleep behavior disorder: Secondary | ICD-10-CM | POA: Diagnosis not present

## 2019-11-02 DIAGNOSIS — R6521 Severe sepsis with septic shock: Secondary | ICD-10-CM | POA: Diagnosis present

## 2019-11-02 DIAGNOSIS — J181 Lobar pneumonia, unspecified organism: Secondary | ICD-10-CM | POA: Diagnosis not present

## 2019-11-02 DIAGNOSIS — N390 Urinary tract infection, site not specified: Secondary | ICD-10-CM | POA: Diagnosis present

## 2019-11-02 DIAGNOSIS — J479 Bronchiectasis, uncomplicated: Secondary | ICD-10-CM | POA: Diagnosis not present

## 2019-11-02 DIAGNOSIS — N132 Hydronephrosis with renal and ureteral calculous obstruction: Secondary | ICD-10-CM | POA: Diagnosis not present

## 2019-11-02 DIAGNOSIS — N319 Neuromuscular dysfunction of bladder, unspecified: Secondary | ICD-10-CM | POA: Diagnosis present

## 2019-11-02 DIAGNOSIS — E119 Type 2 diabetes mellitus without complications: Secondary | ICD-10-CM | POA: Diagnosis not present

## 2019-11-02 DIAGNOSIS — A4152 Sepsis due to Pseudomonas: Secondary | ICD-10-CM | POA: Diagnosis present

## 2019-11-02 DIAGNOSIS — E7800 Pure hypercholesterolemia, unspecified: Secondary | ICD-10-CM | POA: Diagnosis present

## 2019-11-02 DIAGNOSIS — A419 Sepsis, unspecified organism: Secondary | ICD-10-CM | POA: Diagnosis not present

## 2019-11-02 DIAGNOSIS — E785 Hyperlipidemia, unspecified: Secondary | ICD-10-CM | POA: Diagnosis present

## 2019-11-02 DIAGNOSIS — R0602 Shortness of breath: Secondary | ICD-10-CM | POA: Diagnosis not present

## 2019-11-02 DIAGNOSIS — I10 Essential (primary) hypertension: Secondary | ICD-10-CM | POA: Diagnosis present

## 2019-11-02 DIAGNOSIS — E1142 Type 2 diabetes mellitus with diabetic polyneuropathy: Secondary | ICD-10-CM | POA: Diagnosis present

## 2019-11-02 DIAGNOSIS — G4730 Sleep apnea, unspecified: Secondary | ICD-10-CM | POA: Diagnosis not present

## 2019-11-02 DIAGNOSIS — R0600 Dyspnea, unspecified: Secondary | ICD-10-CM | POA: Diagnosis not present

## 2019-11-02 DIAGNOSIS — Z466 Encounter for fitting and adjustment of urinary device: Secondary | ICD-10-CM | POA: Diagnosis not present

## 2019-11-02 DIAGNOSIS — I959 Hypotension, unspecified: Secondary | ICD-10-CM | POA: Diagnosis not present

## 2019-11-02 DIAGNOSIS — L89156 Pressure-induced deep tissue damage of sacral region: Secondary | ICD-10-CM | POA: Diagnosis present

## 2019-11-02 DIAGNOSIS — R0902 Hypoxemia: Secondary | ICD-10-CM | POA: Diagnosis not present

## 2019-11-02 DIAGNOSIS — G4733 Obstructive sleep apnea (adult) (pediatric): Secondary | ICD-10-CM | POA: Diagnosis present

## 2019-11-02 DIAGNOSIS — N12 Tubulo-interstitial nephritis, not specified as acute or chronic: Secondary | ICD-10-CM | POA: Diagnosis not present

## 2019-11-02 DIAGNOSIS — K219 Gastro-esophageal reflux disease without esophagitis: Secondary | ICD-10-CM | POA: Diagnosis not present

## 2019-11-02 DIAGNOSIS — R000 Tachycardia, unspecified: Secondary | ICD-10-CM | POA: Diagnosis not present

## 2019-11-02 DIAGNOSIS — Z20828 Contact with and (suspected) exposure to other viral communicable diseases: Secondary | ICD-10-CM | POA: Diagnosis not present

## 2019-11-02 DIAGNOSIS — Z8619 Personal history of other infectious and parasitic diseases: Secondary | ICD-10-CM | POA: Diagnosis not present

## 2019-11-02 DIAGNOSIS — Z720 Tobacco use: Secondary | ICD-10-CM | POA: Diagnosis not present

## 2019-11-02 DIAGNOSIS — Z7984 Long term (current) use of oral hypoglycemic drugs: Secondary | ICD-10-CM | POA: Diagnosis not present

## 2019-11-02 DIAGNOSIS — R499 Unspecified voice and resonance disorder: Secondary | ICD-10-CM | POA: Diagnosis not present

## 2019-11-02 DIAGNOSIS — N400 Enlarged prostate without lower urinary tract symptoms: Secondary | ICD-10-CM | POA: Diagnosis present

## 2019-11-02 DIAGNOSIS — A4150 Gram-negative sepsis, unspecified: Secondary | ICD-10-CM | POA: Diagnosis not present

## 2019-11-02 DIAGNOSIS — N210 Calculus in bladder: Secondary | ICD-10-CM | POA: Diagnosis present

## 2019-11-02 DIAGNOSIS — F329 Major depressive disorder, single episode, unspecified: Secondary | ICD-10-CM | POA: Diagnosis present

## 2019-11-02 DIAGNOSIS — R6520 Severe sepsis without septic shock: Secondary | ICD-10-CM | POA: Diagnosis not present

## 2019-11-02 DIAGNOSIS — N136 Pyonephrosis: Secondary | ICD-10-CM | POA: Diagnosis present

## 2019-11-02 DIAGNOSIS — N179 Acute kidney failure, unspecified: Secondary | ICD-10-CM | POA: Diagnosis present

## 2019-11-02 DIAGNOSIS — K5790 Diverticulosis of intestine, part unspecified, without perforation or abscess without bleeding: Secondary | ICD-10-CM | POA: Diagnosis present

## 2019-11-02 DIAGNOSIS — R531 Weakness: Secondary | ICD-10-CM | POA: Diagnosis not present

## 2019-11-02 DIAGNOSIS — R918 Other nonspecific abnormal finding of lung field: Secondary | ICD-10-CM | POA: Diagnosis not present

## 2019-11-02 DIAGNOSIS — B965 Pseudomonas (aeruginosa) (mallei) (pseudomallei) as the cause of diseases classified elsewhere: Secondary | ICD-10-CM | POA: Diagnosis present

## 2019-11-02 DIAGNOSIS — J189 Pneumonia, unspecified organism: Secondary | ICD-10-CM | POA: Diagnosis not present

## 2019-11-02 DIAGNOSIS — N201 Calculus of ureter: Secondary | ICD-10-CM | POA: Diagnosis not present

## 2019-11-02 DIAGNOSIS — J9691 Respiratory failure, unspecified with hypoxia: Secondary | ICD-10-CM | POA: Diagnosis not present

## 2019-11-02 DIAGNOSIS — I4891 Unspecified atrial fibrillation: Secondary | ICD-10-CM | POA: Diagnosis not present

## 2019-11-02 DIAGNOSIS — Z8249 Family history of ischemic heart disease and other diseases of the circulatory system: Secondary | ICD-10-CM | POA: Diagnosis not present

## 2019-11-11 ENCOUNTER — Encounter: Payer: Self-pay | Admitting: Family Medicine

## 2019-11-11 NOTE — Telephone Encounter (Signed)
Wrote message back to patient and Sent to Dr Lorelei Pont as a Juluis Rainier

## 2019-11-13 DIAGNOSIS — E1142 Type 2 diabetes mellitus with diabetic polyneuropathy: Secondary | ICD-10-CM | POA: Diagnosis not present

## 2019-11-13 DIAGNOSIS — N12 Tubulo-interstitial nephritis, not specified as acute or chronic: Secondary | ICD-10-CM | POA: Diagnosis not present

## 2019-11-13 DIAGNOSIS — G4752 REM sleep behavior disorder: Secondary | ICD-10-CM | POA: Diagnosis not present

## 2019-11-13 DIAGNOSIS — B965 Pseudomonas (aeruginosa) (mallei) (pseudomallei) as the cause of diseases classified elsewhere: Secondary | ICD-10-CM | POA: Diagnosis not present

## 2019-11-13 DIAGNOSIS — G4733 Obstructive sleep apnea (adult) (pediatric): Secondary | ICD-10-CM | POA: Diagnosis not present

## 2019-11-13 DIAGNOSIS — I10 Essential (primary) hypertension: Secondary | ICD-10-CM | POA: Diagnosis not present

## 2019-11-13 DIAGNOSIS — N136 Pyonephrosis: Secondary | ICD-10-CM | POA: Diagnosis not present

## 2019-11-13 DIAGNOSIS — R531 Weakness: Secondary | ICD-10-CM | POA: Diagnosis not present

## 2019-11-13 DIAGNOSIS — A4152 Sepsis due to Pseudomonas: Secondary | ICD-10-CM | POA: Diagnosis not present

## 2019-11-13 DIAGNOSIS — R499 Unspecified voice and resonance disorder: Secondary | ICD-10-CM | POA: Diagnosis not present

## 2019-11-13 DIAGNOSIS — J9601 Acute respiratory failure with hypoxia: Secondary | ICD-10-CM | POA: Diagnosis not present

## 2019-11-13 DIAGNOSIS — J189 Pneumonia, unspecified organism: Secondary | ICD-10-CM | POA: Diagnosis not present

## 2019-11-13 DIAGNOSIS — Z8619 Personal history of other infectious and parasitic diseases: Secondary | ICD-10-CM | POA: Diagnosis not present

## 2019-11-13 DIAGNOSIS — E119 Type 2 diabetes mellitus without complications: Secondary | ICD-10-CM | POA: Diagnosis not present

## 2019-11-13 DIAGNOSIS — R0602 Shortness of breath: Secondary | ICD-10-CM | POA: Diagnosis not present

## 2019-11-13 DIAGNOSIS — E785 Hyperlipidemia, unspecified: Secondary | ICD-10-CM | POA: Diagnosis not present

## 2019-11-13 DIAGNOSIS — N390 Urinary tract infection, site not specified: Secondary | ICD-10-CM | POA: Diagnosis not present

## 2019-11-13 DIAGNOSIS — Z466 Encounter for fitting and adjustment of urinary device: Secondary | ICD-10-CM | POA: Diagnosis not present

## 2019-11-13 DIAGNOSIS — N210 Calculus in bladder: Secondary | ICD-10-CM | POA: Diagnosis not present

## 2019-11-13 DIAGNOSIS — N179 Acute kidney failure, unspecified: Secondary | ICD-10-CM | POA: Diagnosis not present

## 2019-11-13 DIAGNOSIS — L89152 Pressure ulcer of sacral region, stage 2: Secondary | ICD-10-CM | POA: Diagnosis not present

## 2019-11-13 DIAGNOSIS — R6521 Severe sepsis with septic shock: Secondary | ICD-10-CM | POA: Diagnosis not present

## 2019-11-13 DIAGNOSIS — N319 Neuromuscular dysfunction of bladder, unspecified: Secondary | ICD-10-CM | POA: Diagnosis not present

## 2019-11-13 MED ORDER — Medication
1.00 | Status: DC
Start: 2019-11-14 — End: 2019-11-13

## 2019-11-13 MED ORDER — PHENYLEPHRINE-GUAIFENESIN 20-375 MG PO CP12
5.00 | ORAL_CAPSULE | ORAL | Status: DC
Start: ? — End: 2019-11-13

## 2019-11-13 MED ORDER — GLUCOSAMINE-CHONDROIT-COLLAGEN PO
100.00 | ORAL | Status: DC
Start: ? — End: 2019-11-13

## 2019-11-13 MED ORDER — Medication
5.00 | Status: DC
Start: 2019-11-13 — End: 2019-11-13

## 2019-11-13 MED ORDER — Medication
Status: DC
Start: ? — End: 2019-11-13

## 2019-11-13 MED ORDER — CALCIUM CARBONATE ANTACID PO
0.25 | ORAL | Status: DC
Start: ? — End: 2019-11-13

## 2019-11-13 MED ORDER — DIHYDROTACHYSTEROL 0.125 MG PO TABS
10.00 | ORAL_TABLET | ORAL | Status: DC
Start: 2019-11-13 — End: 2019-11-13

## 2019-11-13 MED ORDER — VICON FORTE PO CAPS
17.00 | ORAL_CAPSULE | ORAL | Status: DC
Start: ? — End: 2019-11-13

## 2019-11-13 MED ORDER — QUINERVA 260 MG PO TABS
650.00 | ORAL_TABLET | ORAL | Status: DC
Start: ? — End: 2019-11-13

## 2019-11-13 MED ORDER — Medication
500.00 | Status: DC
Start: 2019-11-14 — End: 2019-11-13

## 2019-11-13 MED ORDER — Medication
5.00 | Status: DC
Start: ? — End: 2019-11-13

## 2019-11-13 MED ORDER — PHARMALOX 225-200 MG/5ML PO SUSP
5.00 | ORAL | Status: DC
Start: ? — End: 2019-11-13

## 2019-11-13 MED ORDER — PHENYLEPH-POT GUAIACOLSULF
81.00 | Status: DC
Start: 2019-11-14 — End: 2019-11-13

## 2019-11-13 MED ORDER — PRO HERBS ENERGY PO TABS
20.00 | ORAL_TABLET | ORAL | Status: DC
Start: 2019-11-13 — End: 2019-11-13

## 2019-11-13 MED ORDER — INTRON A 6000000 UNIT/ML IJ SOLN
1.00 | INTRAMUSCULAR | Status: DC
Start: ? — End: 2019-11-13

## 2019-11-13 MED ORDER — SERTRALINE HCL 50 MG PO TABS
50.00 | ORAL_TABLET | ORAL | Status: DC
Start: 2019-11-14 — End: 2019-11-13

## 2019-11-13 MED ORDER — EQUATE NICOTINE 4 MG MT GUM
4.00 | CHEWING_GUM | OROMUCOSAL | Status: DC
Start: ? — End: 2019-11-13

## 2019-11-13 MED ORDER — OLANZAPINE-FLUOXETINE HCL 6-50 MG PO CAPS
6.00 | ORAL_CAPSULE | ORAL | Status: DC
Start: 2019-11-13 — End: 2019-11-13

## 2019-11-14 ENCOUNTER — Encounter: Payer: Self-pay | Admitting: Family Medicine

## 2019-11-15 DIAGNOSIS — R531 Weakness: Secondary | ICD-10-CM | POA: Diagnosis not present

## 2019-11-15 DIAGNOSIS — N319 Neuromuscular dysfunction of bladder, unspecified: Secondary | ICD-10-CM | POA: Diagnosis not present

## 2019-11-15 DIAGNOSIS — E785 Hyperlipidemia, unspecified: Secondary | ICD-10-CM | POA: Diagnosis not present

## 2019-11-15 DIAGNOSIS — A4152 Sepsis due to Pseudomonas: Secondary | ICD-10-CM | POA: Diagnosis not present

## 2019-12-03 ENCOUNTER — Telehealth: Payer: Self-pay | Admitting: Family Medicine

## 2019-12-03 ENCOUNTER — Other Ambulatory Visit: Payer: Self-pay

## 2019-12-03 DIAGNOSIS — E1140 Type 2 diabetes mellitus with diabetic neuropathy, unspecified: Secondary | ICD-10-CM | POA: Diagnosis not present

## 2019-12-03 DIAGNOSIS — L89312 Pressure ulcer of right buttock, stage 2: Secondary | ICD-10-CM | POA: Diagnosis not present

## 2019-12-03 DIAGNOSIS — N390 Urinary tract infection, site not specified: Secondary | ICD-10-CM | POA: Diagnosis not present

## 2019-12-03 DIAGNOSIS — Z87891 Personal history of nicotine dependence: Secondary | ICD-10-CM | POA: Diagnosis not present

## 2019-12-03 DIAGNOSIS — E7800 Pure hypercholesterolemia, unspecified: Secondary | ICD-10-CM | POA: Diagnosis not present

## 2019-12-03 DIAGNOSIS — J383 Other diseases of vocal cords: Secondary | ICD-10-CM | POA: Diagnosis not present

## 2019-12-03 DIAGNOSIS — Z466 Encounter for fitting and adjustment of urinary device: Secondary | ICD-10-CM | POA: Diagnosis not present

## 2019-12-03 DIAGNOSIS — N319 Neuromuscular dysfunction of bladder, unspecified: Secondary | ICD-10-CM | POA: Diagnosis not present

## 2019-12-03 DIAGNOSIS — N139 Obstructive and reflux uropathy, unspecified: Secondary | ICD-10-CM | POA: Diagnosis not present

## 2019-12-03 DIAGNOSIS — E785 Hyperlipidemia, unspecified: Secondary | ICD-10-CM | POA: Diagnosis not present

## 2019-12-03 DIAGNOSIS — G4733 Obstructive sleep apnea (adult) (pediatric): Secondary | ICD-10-CM | POA: Diagnosis not present

## 2019-12-03 DIAGNOSIS — B199 Unspecified viral hepatitis without hepatic coma: Secondary | ICD-10-CM | POA: Diagnosis not present

## 2019-12-03 DIAGNOSIS — B965 Pseudomonas (aeruginosa) (mallei) (pseudomallei) as the cause of diseases classified elsewhere: Secondary | ICD-10-CM | POA: Diagnosis not present

## 2019-12-03 DIAGNOSIS — K219 Gastro-esophageal reflux disease without esophagitis: Secondary | ICD-10-CM | POA: Diagnosis not present

## 2019-12-03 NOTE — Telephone Encounter (Signed)
Home Health Verbal Orders -[place]  Caller/Agency: [facility] Number: [phone] Requesting OT/PT/Skilled Nursing/Social Work/Speech Therapy: Skilled nursing  Frequency: 1x9  bathaid 1x4

## 2019-12-04 ENCOUNTER — Encounter: Payer: Self-pay | Admitting: Internal Medicine

## 2019-12-04 ENCOUNTER — Ambulatory Visit (INDEPENDENT_AMBULATORY_CARE_PROVIDER_SITE_OTHER): Payer: Medicare Other | Admitting: Internal Medicine

## 2019-12-04 ENCOUNTER — Inpatient Hospital Stay: Payer: Medicare Other | Admitting: Family Medicine

## 2019-12-04 VITALS — BP 108/59 | HR 76 | Temp 97.4°F | Resp 18 | Ht 70.5 in | Wt 194.0 lb

## 2019-12-04 DIAGNOSIS — L89312 Pressure ulcer of right buttock, stage 2: Secondary | ICD-10-CM | POA: Diagnosis not present

## 2019-12-04 DIAGNOSIS — N139 Obstructive and reflux uropathy, unspecified: Secondary | ICD-10-CM | POA: Diagnosis not present

## 2019-12-04 DIAGNOSIS — N390 Urinary tract infection, site not specified: Secondary | ICD-10-CM | POA: Diagnosis not present

## 2019-12-04 DIAGNOSIS — R9389 Abnormal findings on diagnostic imaging of other specified body structures: Secondary | ICD-10-CM | POA: Diagnosis not present

## 2019-12-04 DIAGNOSIS — R600 Localized edema: Secondary | ICD-10-CM

## 2019-12-04 DIAGNOSIS — Z8619 Personal history of other infectious and parasitic diseases: Secondary | ICD-10-CM

## 2019-12-04 DIAGNOSIS — L89301 Pressure ulcer of unspecified buttock, stage 1: Secondary | ICD-10-CM | POA: Diagnosis not present

## 2019-12-04 DIAGNOSIS — B965 Pseudomonas (aeruginosa) (mallei) (pseudomallei) as the cause of diseases classified elsewhere: Secondary | ICD-10-CM | POA: Diagnosis not present

## 2019-12-04 DIAGNOSIS — E1140 Type 2 diabetes mellitus with diabetic neuropathy, unspecified: Secondary | ICD-10-CM | POA: Diagnosis not present

## 2019-12-04 DIAGNOSIS — N319 Neuromuscular dysfunction of bladder, unspecified: Secondary | ICD-10-CM | POA: Diagnosis not present

## 2019-12-04 DIAGNOSIS — A419 Sepsis, unspecified organism: Secondary | ICD-10-CM

## 2019-12-04 MED ORDER — COLLAGENASE 250 UNIT/GM EX OINT: 1.0000 "application " | TOPICAL_OINTMENT | Freq: Two times a day (BID) | CUTANEOUS | 0 refills | Status: DC

## 2019-12-04 NOTE — Telephone Encounter (Signed)
Verbal orders given to Amber.  

## 2019-12-04 NOTE — Patient Instructions (Addendum)
GO TO THE LAB : Get the blood work     GO TO THE FRONT DESK Schedule your next appointment   to see Dr. Lorelei Pont in 3 weeks   Pressure ulcers: Frequent turning Get ship  Skin You can go to a medical supply store. [facility] has a near home supply store as well. Apply Santyl twice a day Call if increased redness, swelling, discharge.  For swelling: Leg elevation   Check the  blood pressure 2 or 3 times a week BP GOAL is between 110/65 and  135/85. If it is consistently higher or lower, let me know    Preventing Pressure Injuries  A pressure injury, sometimes called a bedsore or a pressure ulcer, is an injury to the skin and underlying tissue caused by pressure. A pressure injury can happen when your skin presses against a surface, such as a mattress or wheelchair seat, for too long. The pressure on the blood vessels causes reduced blood flow to your skin. This can eventually cause the skin tissue to die and break down into a wound. Pressure injuries usually develop:  Over bony parts of the body, such as the tailbone, shoulders, elbows, hips, and heels.  Under medical devices, such as respiratory equipment, stockings, tubes, and splints. How can this condition affect me? Pressure injuries are caused by a lack of blood supply to an area of skin. These injuries begin as a reddened area on the skin and can become an open sore. They can result from intense pressure over a short period of time or from less pressure over a long period of time. Pressure injuries can vary in severity. They can cause pain, muscle damage, and infection. What can increase my risk? This condition is more likely to develop in people who:  Are in the hospital or an extended care facility.  Are bedridden or in a wheelchair.  Have an injury or disease that keeps them from: ? Moving normally. ? Feeling pain or pressure. ? Communicating if they feel pain or pressure.  Have a condition that: ? Makes  them sleepy or less alert. ? Causes poor blood flow.  Need to wear a medical device.  Have poor control of their bladder or bowel functions (incontinence).  Have poor nutrition (malnutrition).  Have had this condition before.  Are of certain ethnicities. People of African American, Latino, or Hispanic descent are at higher risk compared to other ethnic groups. What actions can I take to prevent pressure injuries? Reducing and redistributing pressure  Do not lie or sit in one position for a long time. Move or change position: ? Every hour when out of bed in a chair. ? Every two hours when in bed. ? As often as told by your health care provider.  Use pillows, wedges, or cushions to redistribute pressure. Ask your health care provider to recommend a mattress, cushions, or pads for you.  Use medical devices that do not rub your skin. Tell your health care provider if one of your medical devices is causing pain or irritation. Skin care If you are in the hospital, your health care providers:  Will inspect your skin, including areas under or around medical devices, at least twice a day.  May recommend that you use certain types of bedding to help prevent pressure injuries. These may include a pad, mattress, or chair cushion that is filled with gel, air, water, or foam.  Will evaluate your nutrition and consult a dietitian if needed.  Will  inspect and change any wound dressings regularly.  May help you move into different positions every few hours.  Will adjust any medical devices and braces as needed to limit pressure on your skin.  Will keep your skin clean and dry.  May use gentle cleansers and skin protectants if you are incontinent.  Will moisturize any dry skin. In general, at home:  Keep your skin clean and dry. Gently pat your skin dry.  Do not rub or massage bony areas of your skin.  Moisturize dry skin.  Use gentle cleansers and skin protectants routinely if you  are incontinent.  Check your skin at least once a day for any changes in color and for any new blisters or sores. Make sure to check under and around any medical devices and between skin folds. Have a caregiver do this for you if you are not able.  Lifestyle  Be as active as you can every day. Ask your health care provider to suggest safe exercises or activities.  Do not abuse drugs or alcohol.  Do not use any products that contain nicotine or tobacco, such as cigarettes, e-cigarettes, and chewing tobacco. If you need help quitting, ask your health care provider. General instructions   Take over-the-counter and prescription medicines only as told by your health care provider.  Work with your health care provider to manage any chronic health conditions.  Eat a healthy diet that includes protein, vitamins, and minerals. Ask your health care provider what types of food you should eat.  Drink enough fluid to keep your urine pale yellow.  Keep all follow-up visits as told by your health care provider. This is important. Contact a health care provider if you:  Feel or see any changes in your skin. Summary  A pressure injury, sometimes called a bedsore or a pressure ulcer, is an injury to the skin and underlying tissue caused by pressure.  Do not lie or sit in one position for a long time.  Check your skin at least once a day for any changes in color and for any new blisters or sores.  Make sure to check under and around any medical devices and between skin folds. Have a caregiver do this for you if you are not able.  Eat a healthy diet that includes protein, vitamins, and minerals. Ask your health care provider what types of food you should eat. This information is not intended to replace advice given to you by your health care provider. Make sure you discuss any questions you have with your health care provider. Document Released: 01/05/2005 Document Revised: 03/22/2019 Document  Reviewed: 08/21/2018 Elsevier Patient Education  2020 [employer].

## 2019-12-04 NOTE — Progress Notes (Signed)
Subjective:    Patient ID: Matthew Nash, male    DOB: April 08, 1936, 83 y.o.   MRN: [id number]  DOS:  12/04/2019 Type of visit - description: Hospital follow-up Admission date 11/02/2019, discharged 11/13/2019 He was admitted to an outside facility complaining of pelvic pain with radiation to the back. DX: Septic shock due to Pseudomonas sepsis, UTI, ureteral calculus left, left lower lobe opacity on x-ray. Creatinine was 1.6, white count was elevated, he was tachycardic and met sepsis criteria.  Was admitted to ICU for fluid resuscitation, Levophed. A 5 mm stone was causing left hydronephrosis and hydroureter. Percutaneous nephrostomy tube 11-23- 2020 Blood urine culture shows Pseudomonas  On 11/05/2019 was noted to be in A. fib with RVR VR, treated appropriately. Urology perform a cystoscopy 11/13/2019 and remove with the stone that was already at the bladder level. The nephrostomy tube was removed before discharge.  After getting cefepime at the hospital was discharged on Ciprox5 days.  Was recommended SNF  Needs follow-up with urology and PCP.  11/13/2019: Sodium 141, potassium 3.6, creatinine 0.8,  LFTs normal;  WBC is 8.2, hemoglobin 10.2, platelets 294  Review of Systems  He is here today with his wife. He was sent from the SNF to home 11/30/2019. Since then, no fever chills No chest pain no difficulty breathing Appetite somewhat decreased. Urine output from the catheter is normal: He has chronic edema at the lower extremities associated with a bluish color.  The swelling seems to be somewhat worse since he left the hospital.  Also he has a buttock pressure ulcer and needs some advice.   Past Medical History:  Diagnosis Date  . Arthritis    both knees  . Back pain at L4-L5 level   . Basal cell carcinoma   . BPH (benign prostatic hyperplasia)    hx s/p turp  . Chronic back pain    spondylosis  . Chronic kidney disease    kidney stones 1979  . DDD  (degenerative disc disease), lumbar   . Diabetes mellitus    takes Metformin and Victoza daily-under control  . Diarrhea    x 1 today  . Diverticulitis   . Gallstones   . GERD (gastroesophageal reflux disease)    takes Omeprazole daily-under control  . H/O hiatal hernia   . H/O measles   . Hepatitis B    B-with Mono and jaundice 1960  . History of chicken pox   . History of colon polyps   . History of kidney stones   . Hyperlipidemia    takes Simvastatin and Tricor daily-under control  . Hypertension    takes Lisinopril daily-under control  . Joint pain    both knees  . Joint swelling    rt knee  . Kidney stones   . Melanoma ([place])    right hand;basal cell carcinoma  . Neuropathy   . Peripheral vascular disease ([place])    diabetic neuropathy  . Pneumonia    hx of  . Prostate infection    currently taking macrobid  . Sleep apnea with use of continuous positive airway pressure (CPAP)   . Tinnitus   . Urinary incontinence   . UTI (lower urinary tract infection)     Past Surgical History:  Procedure Laterality Date  . BASAL CELL CARCINOMA EXCISION    . BOTOX INJECTION  03/22/2017   In the bladder for nightly incontinence.  . C5 and T1 bone chip removed   1986  . CAUTERIZE INNER NOSE  [place]  . COLONOSCOPY  2011  . cortisone injection    . EYE SURGERY     cataract sx 2017 bilateral  . I&D left knee Left 1958   states 22 times  . KNEE ARTHROSCOPY Right 07/16/2014   Procedure: RIGHT ARTHROSCOPY KNEE WITH Medial and Lateral DEBRIDEMENT and chondroplasty;  Surgeon: Gearlean Alf, MD;  Location: [facility];  Service: Orthopedics;  Laterality: Right;  . LUMBAR LAMINECTOMY/DECOMPRESSION MICRODISCECTOMY Right 02/17/2014   Procedure: RIGHT LUMBAR TWO-THREE LAMINECTOMY;  Surgeon: Charlie Pitter, MD;  Location: [place] [facility];  Service: Neurosurgery;  Laterality: Right;  right   . right shoulder surgery  2012   rotator cuff repair  . SHOULDER ARTHROSCOPY W/  ROTATOR CUFF REPAIR Left NOV 2010  . TONSILLECTOMY  1942  . TOTAL KNEE ARTHROPLASTY Right 05/27/2015   Procedure: RIGHT TOTAL KNEE ARTHROPLASTY;  Surgeon: Gaynelle Arabian, MD;  Location: [facility];  Service: Orthopedics;  Laterality: Right;  . TOTAL KNEE ARTHROPLASTY Left 05/30/2016   Procedure: LEFT TOTAL KNEE ARTHROPLASTY;  Surgeon: Gaynelle Arabian, MD;  Location: [facility];  Service: Orthopedics;  Laterality: Left;  . TRANSURETHRAL RESECTION OF PROSTATE  2006  . VASECTOMY  1983  . WISDOM TOOTH EXTRACTION      Social History   Socioeconomic History  . Marital status: Married    Spouse name: Not on file  . Number of children: 4  . Years of education: Not on file  . Highest education level: Not on file  Occupational History  . Occupation: retired  Tobacco Use  . Smoking status: Former Smoker    Types: Pipe    Quit date: 07/14/1978    Years since quitting: 41.4  . Smokeless tobacco: Never Used  . Tobacco comment: quit smoking 1979  Substance and Sexual Activity  . Alcohol use: Yes    Comment: OCCASIONAL  . Drug use: No  . Sexual activity: Never  Other Topics Concern  . Not on file  Social History Narrative  . Not on file   Social Determinants of Health   Financial Resource Strain:   . Difficulty of Paying Living Expenses: Not on file  Food Insecurity:   . Worried About Charity fundraiser in the Last Year: Not on file  . Ran Out of Food in the Last Year: Not on file  Transportation Needs:   . Lack of Transportation (Medical): Not on file  . Lack of Transportation (Non-Medical): Not on file  Physical Activity:   . Days of Exercise per Week: Not on file  . Minutes of Exercise per Session: Not on file  Stress:   . Feeling of Stress : Not on file  Social Connections:   . Frequency of Communication with Friends and Family: Not on file  . Frequency of Social Gatherings with Friends and Family: Not on file  . Attends Religious Services: Not on file  . Active Member of Clubs or  Organizations: Not on file  . Attends Archivist Meetings: Not on file  . Marital Status: Not on file  Intimate Partner Violence:   . Fear of Current or Ex-Partner: Not on file  . Emotionally Abused: Not on file  . Physically Abused: Not on file  . Sexually Abused: Not on file      Allergies as of 12/04/2019      Reactions   Sulfamethoxazole-trimethoprim Diarrhea   Severe diarrhea   Albamycin [novobiocin] Itching, Rash   Penicillins Rash, Other (See  Comments)   Has patient had a PCN reaction causing immediate rash, facial/tongue/throat swelling, SOB or lightheadedness with hypotension:  no Has patient had a PCN reaction causing severe rash involving mucus membranes or skin necrosis: no Has patient had a PCN reaction that required hospitalization no Has patient had a PCN reaction occurring within the last 10 years: no - childhood reaction If all of the above answers are "NO", then may proceed with Cephalosporin use.      Medication List       Accurate as of December 04, 2019  3:28 PM. If you have any questions, ask your nurse or doctor.        STOP taking these medications   metFORMIN 500 MG tablet Commonly known as: GLUCOPHAGE Stopped by: Kathlene November, MD     TAKE these medications   Alpha-Lipoic Acid 600 MG Tabs Take by mouth.   aspirin EC 81 MG tablet Take 81 mg by mouth daily.   CALCIUM-MAGNESIUM-VITAMIN D PO Take 2 tablets by mouth 2 (two) times daily.   clonazePAM 0.5 MG tablet Commonly known as: KLONOPIN Take 0.5 tablets (0.25 mg total) by mouth at bedtime.   FIBER PO Take 3 capsules by mouth 2 (two) times daily.   Melatonin 10 MG Tabs Take 1 tablet by mouth at bedtime.   MIRALAX PO Take 17 g by mouth daily.   Multi-Vitamins Tabs Take 1 tablet by mouth daily.   omeprazole 20 MG capsule Commonly known as: PRILOSEC TAKE 1 CAPSULE DAILY What changed: when to take this   PROBIOTIC ADVANCED PO Take 1 tablet by mouth daily.   simvastatin 10  MG tablet Commonly known as: ZOCOR TAKE 1 TABLET AT BEDTIME   Tricor 145 MG tablet Generic drug: fenofibrate TAKE 1 TABLET EVERY EVENING   vitamin C 500 MG tablet Commonly known as: ASCORBIC ACID Take 500 mg by mouth daily.           Objective:   Physical Exam Skin:        BP (!) 108/59 (BP Location: Right Arm, Patient Position: Sitting, Cuff Size: Normal)   Pulse 76   Temp (!) 97.4 F (36.3 C) (Temporal)   Resp 18   Ht 5' 10.5" (1.791 m)   Wt 194 lb (88 kg)   SpO2 95%   BMI 27.44 kg/m  General:   Well developed, elderly gentleman, looks weak but not toxic. HEENT:  Normocephalic . Face symmetric, atraumatic Lungs:  CTA B Normal respiratory effort, no intercostal retractions, no accessory muscle use. Heart: RRR,  no murmur.  Lower extremities: Trace pretibial and periankle edema. Toes and distal feet are indeed bluish in color , a chronic finding. Pedal pulses palpable. Abdomen:  Not distended, soft, non-tender.  Exam performed while the patient was sitting in a wheelchair Skin: Examined while the patient was sitting in his wheelchair Neurologic:  alert & oriented X3.  Speech normal, gait not tested Psych--  Cognition and judgment appear intact.  Cooperative with normal attention span and concentration.  Behavior appropriate. No anxious or depressed appearing.     Assessment     83 year old gentleman.  PMH includes diabetes, neuropathy, neurogenic bladder self catheterize 3-4 times a day, history of previous UTIs and kidney stones presents after a hospital admission:  Septic shock due to urosepsis: See HPI for details, he is recovering, currently has a bladder catheter in place, will see urology next week. We will check a CMP, CBC. He saw physical therapy today for evaluation to  see if he qualifies for more PT. He is currently essentially wheelchair-bound. Pressure ulcer stage I and II: Recommend frequent turning's, ship  skin, Santyl, recommend to  discuss with his home health agency to see if they have a skin care nurse. Lower extremity edema, toe discoloration: This is a chronic problem, previously ABIs 08-2018 were normal, ultrasound 05/2019 show no DVT on either leg.  The family report slight increase swelling from baseline, probably from IV fluids at hospital, recommend low-salt diet.  Avoid diuretics for now, BP today 108/59. Abnormal chest x-ray: Reportedly had a left lower lobe opacity upon admission to the hospital recently, recommend recheck a chest x-ray on RTC.  RTC to see PCP 3 weeks   This visit occurred during the SARS-CoV-2 public health emergency.  Safety protocols were in place, including screening questions prior to the visit, additional usage of staff PPE, and extensive cleaning of exam room while observing appropriate contact time as indicated for disinfecting solutions.

## 2019-12-05 ENCOUNTER — Other Ambulatory Visit: Payer: Self-pay

## 2019-12-05 ENCOUNTER — Other Ambulatory Visit (INDEPENDENT_AMBULATORY_CARE_PROVIDER_SITE_OTHER): Payer: Medicare Other

## 2019-12-05 DIAGNOSIS — A419 Sepsis, unspecified organism: Secondary | ICD-10-CM

## 2019-12-05 LAB — CBC WITH DIFFERENTIAL/PLATELET
Basophils Absolute: 0 10*3/uL (ref 0.0–0.1)
Basophils Relative: 0.6 % (ref 0.0–3.0)
Eosinophils Absolute: 0.2 10*3/uL (ref 0.0–0.7)
Eosinophils Relative: 2.4 % (ref 0.0–5.0)
HCT: 34.1 % — ABNORMAL LOW (ref 39.0–52.0)
Hemoglobin: 10.8 g/dL — ABNORMAL LOW (ref 13.0–17.0)
Lymphocytes Relative: 17 % (ref 12.0–46.0)
Lymphs Abs: 1.3 10*3/uL (ref 0.7–4.0)
MCHC: 31.8 g/dL (ref 30.0–36.0)
MCV: 85.9 fl (ref 78.0–100.0)
Monocytes Absolute: 0.5 10*3/uL (ref 0.1–1.0)
Monocytes Relative: 6.6 % (ref 3.0–12.0)
Neutro Abs: 5.5 10*3/uL (ref 1.4–7.7)
Neutrophils Relative %: 73.4 % (ref 43.0–77.0)
Platelets: 270 10*3/uL (ref 150.0–400.0)
RBC: 3.97 Mil/uL — ABNORMAL LOW (ref 4.22–5.81)
RDW: 15.2 % (ref 11.5–15.5)
WBC: 7.5 10*3/uL (ref 4.0–10.5)

## 2019-12-05 LAB — COMPREHENSIVE METABOLIC PANEL
ALT: 8 U/L (ref 0–53)
AST: 8 U/L (ref 0–37)
Albumin: 3.8 g/dL (ref 3.5–5.2)
Alkaline Phosphatase: 49 U/L (ref 39–117)
BUN: 17 mg/dL (ref 6–23)
CO2: 31 mEq/L (ref 19–32)
Calcium: 9.9 mg/dL (ref 8.4–10.5)
Chloride: 103 mEq/L (ref 96–112)
Creatinine, Ser: 1.06 mg/dL (ref 0.40–1.50)
GFR: 66.94 mL/min (ref >60.00)
Glucose, Bld: 132 mg/dL — ABNORMAL HIGH (ref 70–99)
Potassium: 4.2 mEq/L (ref 3.5–5.1)
Sodium: 142 mEq/L (ref 135–145)
Total Bilirubin: 0.5 mg/dL (ref 0.2–1.2)
Total Protein: 6.8 g/dL (ref 6.0–8.3)

## 2019-12-09 ENCOUNTER — Encounter: Payer: Self-pay | Admitting: Family Medicine

## 2019-12-09 ENCOUNTER — Telehealth: Payer: Self-pay | Admitting: Family Medicine

## 2019-12-09 DIAGNOSIS — N139 Obstructive and reflux uropathy, unspecified: Secondary | ICD-10-CM | POA: Diagnosis not present

## 2019-12-09 DIAGNOSIS — N390 Urinary tract infection, site not specified: Secondary | ICD-10-CM | POA: Diagnosis not present

## 2019-12-09 DIAGNOSIS — L89312 Pressure ulcer of right buttock, stage 2: Secondary | ICD-10-CM | POA: Diagnosis not present

## 2019-12-09 DIAGNOSIS — B965 Pseudomonas (aeruginosa) (mallei) (pseudomallei) as the cause of diseases classified elsewhere: Secondary | ICD-10-CM | POA: Diagnosis not present

## 2019-12-09 DIAGNOSIS — E1140 Type 2 diabetes mellitus with diabetic neuropathy, unspecified: Secondary | ICD-10-CM | POA: Diagnosis not present

## 2019-12-09 DIAGNOSIS — N319 Neuromuscular dysfunction of bladder, unspecified: Secondary | ICD-10-CM | POA: Diagnosis not present

## 2019-12-09 NOTE — Telephone Encounter (Signed)
Home Health Verbal Orders - Caller/Agency: [facility] [place] Number: [phone] Requesting OT/PT/Skilled Nursing/Social Work/Speech Therapy: PT request  Frequency: 1 w 2  2 w 2  1 w 5

## 2019-12-09 NOTE — Telephone Encounter (Signed)
Verbal orders given  

## 2019-12-10 DIAGNOSIS — E1140 Type 2 diabetes mellitus with diabetic neuropathy, unspecified: Secondary | ICD-10-CM | POA: Diagnosis not present

## 2019-12-10 DIAGNOSIS — N139 Obstructive and reflux uropathy, unspecified: Secondary | ICD-10-CM | POA: Diagnosis not present

## 2019-12-10 DIAGNOSIS — B965 Pseudomonas (aeruginosa) (mallei) (pseudomallei) as the cause of diseases classified elsewhere: Secondary | ICD-10-CM | POA: Diagnosis not present

## 2019-12-10 DIAGNOSIS — N390 Urinary tract infection, site not specified: Secondary | ICD-10-CM | POA: Diagnosis not present

## 2019-12-10 DIAGNOSIS — L89312 Pressure ulcer of right buttock, stage 2: Secondary | ICD-10-CM | POA: Diagnosis not present

## 2019-12-10 DIAGNOSIS — N319 Neuromuscular dysfunction of bladder, unspecified: Secondary | ICD-10-CM | POA: Diagnosis not present

## 2019-12-11 DIAGNOSIS — L89312 Pressure ulcer of right buttock, stage 2: Secondary | ICD-10-CM | POA: Diagnosis not present

## 2019-12-11 DIAGNOSIS — N390 Urinary tract infection, site not specified: Secondary | ICD-10-CM | POA: Diagnosis not present

## 2019-12-11 DIAGNOSIS — B965 Pseudomonas (aeruginosa) (mallei) (pseudomallei) as the cause of diseases classified elsewhere: Secondary | ICD-10-CM | POA: Diagnosis not present

## 2019-12-11 DIAGNOSIS — N139 Obstructive and reflux uropathy, unspecified: Secondary | ICD-10-CM | POA: Diagnosis not present

## 2019-12-11 DIAGNOSIS — N319 Neuromuscular dysfunction of bladder, unspecified: Secondary | ICD-10-CM | POA: Diagnosis not present

## 2019-12-11 DIAGNOSIS — E1140 Type 2 diabetes mellitus with diabetic neuropathy, unspecified: Secondary | ICD-10-CM | POA: Diagnosis not present

## 2019-12-12 ENCOUNTER — Telehealth: Payer: Self-pay | Admitting: Family Medicine

## 2019-12-12 NOTE — Telephone Encounter (Unsigned)
Copied from [place] [phone]. Topic: Quick Communication - Home Health Verbal Orders >> Dec 12, 2019  9:51 AM Yvette Rack wrote: Caller/Agency: [place] with Advanced Callback Number: [phone] Requesting OT/PT/Skilled Nursing/Social Work/Speech Therapy: OT Frequency: 1 time a week for 5 weeks

## 2019-12-12 NOTE — Telephone Encounter (Signed)
[place] given verbal orders

## 2019-12-16 DIAGNOSIS — E1140 Type 2 diabetes mellitus with diabetic neuropathy, unspecified: Secondary | ICD-10-CM | POA: Diagnosis not present

## 2019-12-16 DIAGNOSIS — N390 Urinary tract infection, site not specified: Secondary | ICD-10-CM | POA: Diagnosis not present

## 2019-12-16 DIAGNOSIS — N139 Obstructive and reflux uropathy, unspecified: Secondary | ICD-10-CM | POA: Diagnosis not present

## 2019-12-16 DIAGNOSIS — B965 Pseudomonas (aeruginosa) (mallei) (pseudomallei) as the cause of diseases classified elsewhere: Secondary | ICD-10-CM | POA: Diagnosis not present

## 2019-12-16 DIAGNOSIS — N319 Neuromuscular dysfunction of bladder, unspecified: Secondary | ICD-10-CM | POA: Diagnosis not present

## 2019-12-16 DIAGNOSIS — L89312 Pressure ulcer of right buttock, stage 2: Secondary | ICD-10-CM | POA: Diagnosis not present

## 2019-12-17 DIAGNOSIS — B965 Pseudomonas (aeruginosa) (mallei) (pseudomallei) as the cause of diseases classified elsewhere: Secondary | ICD-10-CM | POA: Diagnosis not present

## 2019-12-17 DIAGNOSIS — N390 Urinary tract infection, site not specified: Secondary | ICD-10-CM | POA: Diagnosis not present

## 2019-12-17 DIAGNOSIS — N319 Neuromuscular dysfunction of bladder, unspecified: Secondary | ICD-10-CM | POA: Diagnosis not present

## 2019-12-17 DIAGNOSIS — E1140 Type 2 diabetes mellitus with diabetic neuropathy, unspecified: Secondary | ICD-10-CM | POA: Diagnosis not present

## 2019-12-17 DIAGNOSIS — N139 Obstructive and reflux uropathy, unspecified: Secondary | ICD-10-CM | POA: Diagnosis not present

## 2019-12-17 DIAGNOSIS — L89312 Pressure ulcer of right buttock, stage 2: Secondary | ICD-10-CM | POA: Diagnosis not present

## 2019-12-19 DIAGNOSIS — L89312 Pressure ulcer of right buttock, stage 2: Secondary | ICD-10-CM | POA: Diagnosis not present

## 2019-12-19 DIAGNOSIS — N139 Obstructive and reflux uropathy, unspecified: Secondary | ICD-10-CM | POA: Diagnosis not present

## 2019-12-19 DIAGNOSIS — N319 Neuromuscular dysfunction of bladder, unspecified: Secondary | ICD-10-CM | POA: Diagnosis not present

## 2019-12-19 DIAGNOSIS — B965 Pseudomonas (aeruginosa) (mallei) (pseudomallei) as the cause of diseases classified elsewhere: Secondary | ICD-10-CM | POA: Diagnosis not present

## 2019-12-19 DIAGNOSIS — E1140 Type 2 diabetes mellitus with diabetic neuropathy, unspecified: Secondary | ICD-10-CM | POA: Diagnosis not present

## 2019-12-19 DIAGNOSIS — N390 Urinary tract infection, site not specified: Secondary | ICD-10-CM | POA: Diagnosis not present

## 2019-12-23 DIAGNOSIS — N139 Obstructive and reflux uropathy, unspecified: Secondary | ICD-10-CM | POA: Diagnosis not present

## 2019-12-23 DIAGNOSIS — N319 Neuromuscular dysfunction of bladder, unspecified: Secondary | ICD-10-CM | POA: Diagnosis not present

## 2019-12-23 DIAGNOSIS — L89312 Pressure ulcer of right buttock, stage 2: Secondary | ICD-10-CM | POA: Diagnosis not present

## 2019-12-23 DIAGNOSIS — B965 Pseudomonas (aeruginosa) (mallei) (pseudomallei) as the cause of diseases classified elsewhere: Secondary | ICD-10-CM | POA: Diagnosis not present

## 2019-12-23 DIAGNOSIS — N390 Urinary tract infection, site not specified: Secondary | ICD-10-CM | POA: Diagnosis not present

## 2019-12-23 DIAGNOSIS — E1140 Type 2 diabetes mellitus with diabetic neuropathy, unspecified: Secondary | ICD-10-CM | POA: Diagnosis not present

## 2019-12-23 NOTE — Progress Notes (Deleted)
[facility] [address] [place], [place] [address] [id number] [phone]  Date:  12/25/2019   Name:  Matthew Nash   DOB:  1936/11/11   MRN:  [id number]  PCP:  Darreld Mclean, MD    Chief Complaint: No chief complaint on file.   History of Present Illness:  Matthew Nash is a 84 y.o. very pleasant male patient who presents with the following:  Here today for periodic follow-up visit History of hypertension, chronic kidney disease, pelvic floor dysfunction with incontinence and urinary retention requiring self cath, peripheral neuropathy, REM behavior disorder, diabetes  He saw my partner Dr. Larose Kells on 12/23 following an admission for septic shock  He was admitted from 11/21 to 11/13/2019 at [facility] with septic shock-urosepsis, Pseudomonas  Lab Results  Component Value Date   HGBA1C 6.8 (H) 06/08/2019   Due for A1c, recheck CBC  Patient Active Problem List   Diagnosis Date Noted  . HTN (hypertension) 06/07/2019  . AKI (acute kidney injury) ([place]) 06/07/2019  . Elevated troponin 06/07/2019  . Acute respiratory failure with hypoxia ([place]) 06/06/2019  . Chronic kidney disease 11/25/2018  . Ischemic chest pain ([place]) 11/25/2018  . Chest pain 11/25/2018  . Pelvic floor dysfunction 04/04/2018  . OSA on CPAP 01/12/2018  . REM behavioral disorder 01/12/2018  . Diarrhea 09/15/2017  . Incontinence of feces 09/15/2017  . Peripheral neuropathy 10/20/2016  . Bladder outlet obstruction 05/02/2016  . OA (osteoarthritis) of knee 05/27/2015  . Diabetes mellitus type II, controlled ([place]) 11/26/2014  . Gastroesophageal reflux disease without esophagitis 11/26/2014  . Arthritis of both knees 11/26/2014  . Absence of bladder continence 11/26/2014  . Hyperlipidemia LDL goal <100 11/26/2014  . Acute medial meniscal tear 07/16/2014  . Lumbosacral spondylosis without myelopathy 02/17/2014  . Spondylosis, lumbosacral 02/17/2014     Past Medical History:  Diagnosis Date  . Arthritis    both knees  . Back pain at L4-L5 level   . Basal cell carcinoma   . BPH (benign prostatic hyperplasia)    hx s/p turp  . Chronic back pain    spondylosis  . Chronic kidney disease    kidney stones 1979  . DDD (degenerative disc disease), lumbar   . Diabetes mellitus    takes Metformin and Victoza daily-under control  . Diarrhea    x 1 today  . Diverticulitis   . Gallstones   . GERD (gastroesophageal reflux disease)    takes Omeprazole daily-under control  . H/O hiatal hernia   . H/O measles   . Hepatitis B    B-with Mono and jaundice 1960  . History of chicken pox   . History of colon polyps   . History of kidney stones   . Hyperlipidemia    takes Simvastatin and Tricor daily-under control  . Hypertension    takes Lisinopril daily-under control  . Joint pain    both knees  . Joint swelling    rt knee  . Kidney stones   . Melanoma ([place])    right hand;basal cell carcinoma  . Neuropathy   . Peripheral vascular disease ([place])    diabetic neuropathy  . Pneumonia    hx of  . Prostate infection    currently taking macrobid  . Sleep apnea with use of continuous positive airway pressure (CPAP)   . Tinnitus   . Urinary incontinence   . UTI (lower urinary tract infection)  Past Surgical History:  Procedure Laterality Date  . BASAL CELL CARCINOMA EXCISION    . BOTOX INJECTION  03/22/2017   In the bladder for nightly incontinence.  . C5 and T1 bone chip removed   1986  . [place]  . CHOLECYSTECTOMY  1993  . COLONOSCOPY  2011  . cortisone injection    . EYE SURGERY     cataract sx 2017 bilateral  . I&D left knee Left 1958   states 22 times  . KNEE ARTHROSCOPY Right 07/16/2014   Procedure: RIGHT ARTHROSCOPY KNEE WITH Medial and Lateral DEBRIDEMENT and chondroplasty;  Surgeon: Gearlean Alf, MD;  Location: [facility];  Service: Orthopedics;  Laterality: Right;  . LUMBAR  LAMINECTOMY/DECOMPRESSION MICRODISCECTOMY Right 02/17/2014   Procedure: RIGHT LUMBAR TWO-THREE LAMINECTOMY;  Surgeon: Charlie Pitter, MD;  Location: [place] [facility];  Service: Neurosurgery;  Laterality: Right;  right   . right shoulder surgery  2012   rotator cuff repair  . SHOULDER ARTHROSCOPY W/ ROTATOR CUFF REPAIR Left NOV 2010  . TONSILLECTOMY  1942  . TOTAL KNEE ARTHROPLASTY Right 05/27/2015   Procedure: RIGHT TOTAL KNEE ARTHROPLASTY;  Surgeon: Gaynelle Arabian, MD;  [facility]: [facility];  Service: Orthopedics;  Laterality: Right;  . TOTAL KNEE ARTHROPLASTY Left 05/30/2016   Procedure: LEFT TOTAL KNEE ARTHROPLASTY;  Surgeon: Gaynelle Arabian, MD;  Location: [facility];  Service: Orthopedics;  Laterality: Left;  . TRANSURETHRAL RESECTION OF PROSTATE  2006  . VASECTOMY  1983  . WISDOM TOOTH EXTRACTION      Social History   Tobacco Use  . Smoking status: Former Smoker    Types: Pipe    Quit date: 07/14/1978    Years since quitting: 41.4  . Smokeless tobacco: Never Used  . Tobacco comment: quit smoking 1979  Substance Use Topics  . Alcohol use: Yes    Comment: OCCASIONAL  . Drug use: No    Family History  Problem Relation Age of Onset  . Heart disease Mother 71       Deceased  . Bladder Cancer Mother   . Breast cancer Mother   . Prostate cancer Father        Deceased-in 67s  . Healthy Brother   . Skin cancer Brother        "basal cell cancer in the bloodstream"  . Asthma Sister        Deceased  . Healthy Son        #1  . Healthy Daughter        #2  . Obesity Son        #2    Allergies  Allergen Reactions  . Sulfamethoxazole-Trimethoprim Diarrhea    Severe diarrhea  . Albamycin [Novobiocin] Itching and Rash  . Penicillins Rash and Other (See Comments)    Has patient had a PCN reaction causing immediate rash, facial/tongue/throat swelling, SOB or lightheadedness with hypotension:  no Has patient had a PCN reaction causing severe rash involving mucus membranes or skin necrosis:  no Has patient had a PCN reaction that required hospitalization no Has patient had a PCN reaction occurring within the last 10 years: no - childhood reaction If all of the above answers are "NO", then may proceed with Cephalosporin use.     Medication list has been reviewed and updated.  Current Outpatient Medications on File Prior to Visit  Medication Sig Dispense Refill  . Alpha-Lipoic Acid 600 MG TABS Take by mouth.    [name] Kitchen aspirin EC 81  MG tablet Take 81 mg by mouth daily.    [name] Kitchen CALCIUM-MAGNESIUM-VITAMIN D PO Take 2 tablets by mouth 2 (two) times daily.     . clonazePAM (KLONOPIN) 0.5 MG tablet Take 0.5 tablets (0.25 mg total) by mouth at bedtime. 90 tablet 0  . collagenase (SANTYL) ointment Apply 1 application topically 2 (two) times daily. 30 g 0  . FIBER PO Take 3 capsules by mouth 2 (two) times daily.     . Melatonin 10 MG TABS Take 1 tablet by mouth at bedtime.    . Multiple Vitamin (MULTI-VITAMINS) TABS Take 1 tablet by mouth daily.     [name] Kitchen omeprazole (PRILOSEC) 20 MG capsule TAKE 1 CAPSULE DAILY (Patient taking differently: Take 20 mg by mouth every morning. ) 90 capsule 3  . Polyethylene Glycol 3350 (MIRALAX PO) Take 17 g by mouth daily.     . Probiotic Product (PROBIOTIC ADVANCED PO) Take 1 tablet by mouth daily.     . simvastatin (ZOCOR) 10 MG tablet TAKE 1 TABLET AT BEDTIME (Patient taking differently: Take 10 mg by mouth at bedtime. ) 90 tablet 1  . TRICOR 145 MG tablet TAKE 1 TABLET EVERY EVENING 90 tablet 4  . vitamin C (ASCORBIC ACID) 500 MG tablet Take 500 mg by mouth daily.     No current facility-administered medications on file prior to visit.    Review of Systems:  As per HPI- otherwise negative.   Physical Examination: There were no vitals filed for this visit. There were no vitals filed for this visit. There is no height or weight on file to calculate BMI. Ideal Body Weight:    GEN: WDWN, NAD, Non-toxic, A & O x 3 HEENT: Atraumatic, Normocephalic. Neck  supple. No masses, No LAD. Ears and Nose: No external deformity. CV: RRR, No M/G/R. No JVD. No thrill. No extra heart sounds. PULM: CTA B, no wheezes, crackles, rhonchi. No retractions. No resp. distress. No accessory muscle use. ABD: S, NT, ND, +BS. No rebound. No HSM. EXTR: No c/c/e NEURO Normal gait.  PSYCH: Normally interactive. Conversant. Not depressed or anxious appearing.  Calm demeanor.    Assessment and Plan: *** This visit occurred during the SARS-CoV-2 public health emergency.  Safety protocols were in place, including screening questions prior to the visit, additional usage of staff PPE, and extensive cleaning of exam room while observing appropriate contact time as indicated for disinfecting solutions.    Signed Lamar Blinks, MD

## 2019-12-24 DIAGNOSIS — B965 Pseudomonas (aeruginosa) (mallei) (pseudomallei) as the cause of diseases classified elsewhere: Secondary | ICD-10-CM | POA: Diagnosis not present

## 2019-12-24 DIAGNOSIS — E1140 Type 2 diabetes mellitus with diabetic neuropathy, unspecified: Secondary | ICD-10-CM | POA: Diagnosis not present

## 2019-12-24 DIAGNOSIS — N390 Urinary tract infection, site not specified: Secondary | ICD-10-CM | POA: Diagnosis not present

## 2019-12-24 DIAGNOSIS — L89312 Pressure ulcer of right buttock, stage 2: Secondary | ICD-10-CM | POA: Diagnosis not present

## 2019-12-24 DIAGNOSIS — N319 Neuromuscular dysfunction of bladder, unspecified: Secondary | ICD-10-CM | POA: Diagnosis not present

## 2019-12-24 DIAGNOSIS — N139 Obstructive and reflux uropathy, unspecified: Secondary | ICD-10-CM | POA: Diagnosis not present

## 2019-12-25 ENCOUNTER — Ambulatory Visit: Payer: Medicare Other | Admitting: Family Medicine

## 2019-12-26 ENCOUNTER — Other Ambulatory Visit: Payer: Self-pay | Admitting: Family Medicine

## 2019-12-26 DIAGNOSIS — E785 Hyperlipidemia, unspecified: Secondary | ICD-10-CM

## 2019-12-27 ENCOUNTER — Other Ambulatory Visit: Payer: Self-pay

## 2019-12-27 DIAGNOSIS — B965 Pseudomonas (aeruginosa) (mallei) (pseudomallei) as the cause of diseases classified elsewhere: Secondary | ICD-10-CM | POA: Diagnosis not present

## 2019-12-27 DIAGNOSIS — L89312 Pressure ulcer of right buttock, stage 2: Secondary | ICD-10-CM | POA: Diagnosis not present

## 2019-12-27 DIAGNOSIS — E1140 Type 2 diabetes mellitus with diabetic neuropathy, unspecified: Secondary | ICD-10-CM | POA: Diagnosis not present

## 2019-12-27 DIAGNOSIS — N139 Obstructive and reflux uropathy, unspecified: Secondary | ICD-10-CM | POA: Diagnosis not present

## 2019-12-27 DIAGNOSIS — N319 Neuromuscular dysfunction of bladder, unspecified: Secondary | ICD-10-CM | POA: Diagnosis not present

## 2019-12-27 DIAGNOSIS — N390 Urinary tract infection, site not specified: Secondary | ICD-10-CM | POA: Diagnosis not present

## 2019-12-29 NOTE — Progress Notes (Signed)
[facility] [place], [place], [place] [address] [id number] [phone]  Date:  12/30/2019   Name:  Matthew Nash   DOB:  11/11/1936   MRN:  [id number]  PCP:  Darreld Mclean, MD    Chief Complaint: Follow-up (3 week follow up) and BedSore   History of Present Illness:  Matthew Nash is a 84 y.o. very pleasant male patient who presents with the following:  Last seen by myself in September Here today for periodic follow-up visit History of hypertension, sleep apnea with CPAP, diabetes, peripheral neuropathy, chronic kidney disease, bladder incontinence, REM behavioral disorder  He saw my partner Dr. Larose Kells about 1 month ago following a hospital admission with urosepsis He was admitted at [facility] from 11/21-12/2, with septic shock due to Pseudomonas from UTI as well as nephrolithiasis He was discharged to rehab for about 4 weeks, during which time he developed a sacral decubitus ulcer He is now home, saw his outpatient urologist about 3 weeks ago He still has an indwelling catheter and bag, does not feel that he is strong enough yet to restart his in and out self caths  He notes that his skin ulcer is still there but getting better- home health is coming out once a week to change his dressing  They are using cotton briefs which are easier on his skin than the depends they were using They stopped using santyl cream at this time   He is seeing OT and PT twice a week He is working hard on his upper body strength  He is using a walker at home- used a chair to come into my office as it is a long walk back to my exam room  Overall patient and his wife feel that he is making progress in getting his strength back They mostly wanted to check his decubitus ulcer today No fever or chills   Patient Active Problem List   Diagnosis Date Noted  . HTN (hypertension) 06/07/2019  . AKI (acute kidney injury) ([place]) 06/07/2019   . Elevated troponin 06/07/2019  . Acute respiratory failure with hypoxia ([place]) 06/06/2019  . Chronic kidney disease 11/25/2018  . Ischemic chest pain ([place]) 11/25/2018  . Chest pain 11/25/2018  . Pelvic floor dysfunction 04/04/2018  . OSA on CPAP 01/12/2018  . REM behavioral disorder 01/12/2018  . Diarrhea 09/15/2017  . Incontinence of feces 09/15/2017  . Peripheral neuropathy 10/20/2016  . Bladder outlet obstruction 05/02/2016  . OA (osteoarthritis) of knee 05/27/2015  . Diabetes mellitus type II, controlled ([place]) 11/26/2014  . Gastroesophageal reflux disease without esophagitis 11/26/2014  . Arthritis of both knees 11/26/2014  . Absence of bladder continence 11/26/2014  . Hyperlipidemia LDL goal <100 11/26/2014  . Acute medial meniscal tear 07/16/2014  . Lumbosacral spondylosis without myelopathy 02/17/2014  . Spondylosis, lumbosacral 02/17/2014    Past Medical History:  Diagnosis Date  . Arthritis    both knees  . Back pain at L4-L5 level   . Basal cell carcinoma   . BPH (benign prostatic hyperplasia)    hx s/p turp  . Chronic back pain    spondylosis  . Chronic kidney disease    kidney stones 1979  . DDD (degenerative disc disease), lumbar   . Diabetes mellitus    takes Metformin and Victoza daily-under control  . Diarrhea    x 1 today  . Diverticulitis   . Gallstones   .  GERD (gastroesophageal reflux disease)    takes Omeprazole daily-under control  . H/O hiatal hernia   . H/O measles   . Hepatitis B    B-with Mono and jaundice 1960  . History of chicken pox   . History of colon polyps   . History of kidney stones   . Hyperlipidemia    takes Simvastatin and Tricor daily-under control  . Hypertension    takes Lisinopril daily-under control  . Joint pain    both knees  . Joint swelling    rt knee  . Kidney stones   . Melanoma ([place])    right hand;basal cell carcinoma  . Neuropathy   . Peripheral vascular disease ([place])    diabetic neuropathy  .  Pneumonia    hx of  . Prostate infection    currently taking macrobid  . Sleep apnea with use of continuous positive airway pressure (CPAP)   . Tinnitus   . Urinary incontinence   . UTI (lower urinary tract infection)     Past Surgical History:  Procedure Laterality Date  . BASAL CELL CARCINOMA EXCISION    . BOTOX INJECTION  03/22/2017   In the bladder for nightly incontinence.  . C5 and T1 bone chip removed   1986  . [place]  . CHOLECYSTECTOMY  1993  . COLONOSCOPY  2011  . cortisone injection    . EYE SURGERY     cataract sx 2017 bilateral  . I&D left knee Left 1958   states 22 times  . KNEE ARTHROSCOPY Right 07/16/2014   Procedure: RIGHT ARTHROSCOPY KNEE WITH Medial and Lateral DEBRIDEMENT and chondroplasty;  Surgeon: Gearlean Alf, MD;  Location: [facility];  Service: Orthopedics;  Laterality: Right;  . LUMBAR LAMINECTOMY/DECOMPRESSION MICRODISCECTOMY Right 02/17/2014   Procedure: RIGHT LUMBAR TWO-THREE LAMINECTOMY;  Surgeon: Charlie Pitter, MD;  Location: [facility];  Service: Neurosurgery;  Laterality: Right;  right   . right shoulder surgery  2012   rotator cuff repair  . SHOULDER ARTHROSCOPY W/ ROTATOR CUFF REPAIR Left NOV 2010  . TONSILLECTOMY  1942  . TOTAL KNEE ARTHROPLASTY Right 05/27/2015   Procedure: RIGHT TOTAL KNEE ARTHROPLASTY;  Surgeon: Gaynelle Arabian, MD;  [facility]: [facility];  Service: Orthopedics;  Laterality: Right;  . TOTAL KNEE ARTHROPLASTY Left 05/30/2016   Procedure: LEFT TOTAL KNEE ARTHROPLASTY;  Surgeon: Gaynelle Arabian, MD;  [facility];  Service: Orthopedics;  Laterality: Left;  . TRANSURETHRAL RESECTION OF PROSTATE  2006  . VASECTOMY  1983  . WISDOM TOOTH EXTRACTION      Social History   Tobacco Use  . Smoking status: Former Smoker    Types: Pipe    Quit date: 07/14/1978    Years since quitting: 41.4  . Smokeless tobacco: Never Used  . Tobacco comment: quit smoking 1979  Substance Use Topics  . Alcohol use: Yes    Comment:  OCCASIONAL  . Drug use: No    Family History  Problem Relation Age of Onset  . Heart disease Mother 24       Deceased  . Bladder Cancer Mother   . Breast cancer Mother   . Prostate cancer Father        Deceased-in 75s  . Healthy Brother   . Skin cancer Brother        "basal cell cancer in the bloodstream"  . Asthma Sister        Deceased  . Healthy Son        #  1  . Healthy Daughter        #2  . Obesity Son        #2    Allergies  Allergen Reactions  . Sulfamethoxazole-Trimethoprim Diarrhea    Severe diarrhea  . Albamycin [Novobiocin] Itching and Rash  . Penicillins Rash and Other (See Comments)    Has patient had a PCN reaction causing immediate rash, facial/tongue/throat swelling, SOB or lightheadedness with hypotension:  no Has patient had a PCN reaction causing severe rash involving mucus membranes or skin necrosis: no Has patient had a PCN reaction that required hospitalization no Has patient had a PCN reaction occurring within the last 10 years: no - childhood reaction If all of the above answers are "NO", then may proceed with Cephalosporin use.     Medication list has been reviewed and updated.  Current Outpatient Medications on File Prior to Visit  Medication Sig Dispense Refill  . Alpha-Lipoic Acid 600 MG TABS Take by mouth.    [name] Kitchen aspirin EC 81 MG tablet Take 81 mg by mouth daily.    [name] Kitchen CALCIUM-MAGNESIUM-VITAMIN D PO Take 2 tablets by mouth 2 (two) times daily.     . clonazePAM (KLONOPIN) 0.5 MG tablet Take 0.5 tablets (0.25 mg total) by mouth at bedtime. 90 tablet 0  . collagenase (SANTYL) ointment Apply 1 application topically 2 (two) times daily. 30 g 0  . FIBER PO Take 3 capsules by mouth 2 (two) times daily.     . Melatonin 10 MG TABS Take 1 tablet by mouth at bedtime.    . Multiple Vitamin (MULTI-VITAMINS) TABS Take 1 tablet by mouth daily.     [name] Kitchen omeprazole (PRILOSEC) 20 MG capsule TAKE 1 CAPSULE DAILY (Patient taking differently: Take 20 mg by mouth  every morning. ) 90 capsule 3  . Polyethylene Glycol 3350 (MIRALAX PO) Take 17 g by mouth daily.     . Probiotic Product (PROBIOTIC ADVANCED PO) Take 1 tablet by mouth daily.     . simvastatin (ZOCOR) 10 MG tablet Take 1 tablet (10 mg total) by mouth at bedtime. 90 tablet 3  . TRICOR 145 MG tablet TAKE 1 TABLET EVERY EVENING 90 tablet 3  . vitamin C (ASCORBIC ACID) 500 MG tablet Take 500 mg by mouth daily.     No current facility-administered medications on file prior to visit.    Review of Systems:  As per HPI- otherwise negative. Accompanied by his wife who contributes to the history today  Physical Examination: Vitals:   12/30/19 1605  BP: 124/70  Pulse: 70  Resp: (!) 94  Temp: (!) 96.9 F (36.1 C)   Vitals:   12/30/19 1605  Weight: 218 lb (98.9 kg)  Height: 5' 10.5" (1.791 m)   Body mass index is 30.84 kg/m. Ideal Body Weight: Weight in (lb) to have BMI = 25: 176.4  Of note, respiratory rate entered above in the air-patient not tachypneic on exam GEN: WDWN, NAD, Non-toxic, A & O x 3, looks well but weaker than his normal self HEENT: Atraumatic, Normocephalic. Neck supple. No masses, No LAD. Ears and Nose: No external deformity. CV: RRR, No M/G/R. No JVD. No thrill. No extra heart sounds. PULM: CTA B, no wheezes, crackles, rhonchi. No retractions. No resp. distress. No accessory muscle use. ABD: S, NT, ND. No rebound. No HSM. EXTR: No c/c/e NEURO patient typically uses AFOs, he is not wearing these today.  He is able to rise from wheelchair and stand for me to examine  his decubitus ulcer His gait is slow and unsteady, he is using a wheelchair He has a stage I decubitus ulcer at the superior aspect of the gluteal cleft PSYCH: Normally interactive. Conversant. Not depressed or anxious appearing.  Calm demeanor.    Assessment and Plan: Pressure injury of buttock, stage 1, unspecified laterality  History of septic shock  Lower extremity edema  Abnormal chest  x-ray  Controlled type 2 diabetes mellitus without complication, without long-term current use of insulin (HCC)  Hyperlipidemia LDL goal <100  Nocturnal enuresis  Other polyneuropathy  Here today for follow-up visit Rooney was admitted for 10 days at the end of November with urosepsis, then was discharged to SNF due to weakness He is now back home, gradually getting better Sacral decub appears to making progress, I dressed it with a gel dressing and encouraged his wife to continue either gel dressings or other methods as recommended by home health Encouraged continued PT and OT Diabetes has been under good control with diet only recently, plan to draw labs at next visit Lab Results  Component Value Date   HGBA1C 6.8 (H) 06/08/2019   Him to see me in 6 -8 weeks for a follow-up visit, sooner if he is getting worse or has any other concerns Moderate medical decision making today This visit occurred during the SARS-CoV-2 public health emergency.  Safety protocols were in place, including screening questions prior to the visit, additional usage of staff PPE, and extensive cleaning of exam room while observing appropriate contact time as indicated for disinfecting solutions.    Signed Lamar Blinks, MD

## 2019-12-30 ENCOUNTER — Ambulatory Visit (INDEPENDENT_AMBULATORY_CARE_PROVIDER_SITE_OTHER): Payer: Medicare Other | Admitting: Family Medicine

## 2019-12-30 ENCOUNTER — Encounter: Payer: Self-pay | Admitting: Family Medicine

## 2019-12-30 ENCOUNTER — Other Ambulatory Visit: Payer: Self-pay

## 2019-12-30 VITALS — BP 124/70 | HR 70 | Temp 96.9°F | Resp 94 | Ht 70.5 in | Wt 218.0 lb

## 2019-12-30 DIAGNOSIS — E785 Hyperlipidemia, unspecified: Secondary | ICD-10-CM | POA: Diagnosis not present

## 2019-12-30 DIAGNOSIS — R600 Localized edema: Secondary | ICD-10-CM | POA: Diagnosis not present

## 2019-12-30 DIAGNOSIS — B965 Pseudomonas (aeruginosa) (mallei) (pseudomallei) as the cause of diseases classified elsewhere: Secondary | ICD-10-CM | POA: Diagnosis not present

## 2019-12-30 DIAGNOSIS — N3944 Nocturnal enuresis: Secondary | ICD-10-CM | POA: Diagnosis not present

## 2019-12-30 DIAGNOSIS — Z8619 Personal history of other infectious and parasitic diseases: Secondary | ICD-10-CM | POA: Diagnosis not present

## 2019-12-30 DIAGNOSIS — E119 Type 2 diabetes mellitus without complications: Secondary | ICD-10-CM

## 2019-12-30 DIAGNOSIS — N390 Urinary tract infection, site not specified: Secondary | ICD-10-CM | POA: Diagnosis not present

## 2019-12-30 DIAGNOSIS — L89301 Pressure ulcer of unspecified buttock, stage 1: Secondary | ICD-10-CM | POA: Diagnosis not present

## 2019-12-30 DIAGNOSIS — L89312 Pressure ulcer of right buttock, stage 2: Secondary | ICD-10-CM | POA: Diagnosis not present

## 2019-12-30 DIAGNOSIS — R9389 Abnormal findings on diagnostic imaging of other specified body structures: Secondary | ICD-10-CM | POA: Diagnosis not present

## 2019-12-30 DIAGNOSIS — N139 Obstructive and reflux uropathy, unspecified: Secondary | ICD-10-CM | POA: Diagnosis not present

## 2019-12-30 DIAGNOSIS — N319 Neuromuscular dysfunction of bladder, unspecified: Secondary | ICD-10-CM | POA: Diagnosis not present

## 2019-12-30 DIAGNOSIS — G6289 Other specified polyneuropathies: Secondary | ICD-10-CM | POA: Diagnosis not present

## 2019-12-30 DIAGNOSIS — E1140 Type 2 diabetes mellitus with diabetic neuropathy, unspecified: Secondary | ICD-10-CM | POA: Diagnosis not present

## 2019-12-30 NOTE — Patient Instructions (Addendum)
It was good to see you today- your sacral ulcer appears to be making good progress I would perhaps try a gel dressing like the one I gave you for your ulcer   Please see me in 6- 8 weeks for a recheck- sooner if you need anything

## 2019-12-31 DIAGNOSIS — N139 Obstructive and reflux uropathy, unspecified: Secondary | ICD-10-CM | POA: Diagnosis not present

## 2019-12-31 DIAGNOSIS — E1140 Type 2 diabetes mellitus with diabetic neuropathy, unspecified: Secondary | ICD-10-CM | POA: Diagnosis not present

## 2019-12-31 DIAGNOSIS — B965 Pseudomonas (aeruginosa) (mallei) (pseudomallei) as the cause of diseases classified elsewhere: Secondary | ICD-10-CM | POA: Diagnosis not present

## 2019-12-31 DIAGNOSIS — N319 Neuromuscular dysfunction of bladder, unspecified: Secondary | ICD-10-CM | POA: Diagnosis not present

## 2019-12-31 DIAGNOSIS — L89312 Pressure ulcer of right buttock, stage 2: Secondary | ICD-10-CM | POA: Diagnosis not present

## 2019-12-31 DIAGNOSIS — N390 Urinary tract infection, site not specified: Secondary | ICD-10-CM | POA: Diagnosis not present

## 2020-01-02 DIAGNOSIS — J383 Other diseases of vocal cords: Secondary | ICD-10-CM | POA: Diagnosis not present

## 2020-01-02 DIAGNOSIS — E1140 Type 2 diabetes mellitus with diabetic neuropathy, unspecified: Secondary | ICD-10-CM | POA: Diagnosis not present

## 2020-01-02 DIAGNOSIS — N390 Urinary tract infection, site not specified: Secondary | ICD-10-CM | POA: Diagnosis not present

## 2020-01-02 DIAGNOSIS — B199 Unspecified viral hepatitis without hepatic coma: Secondary | ICD-10-CM | POA: Diagnosis not present

## 2020-01-02 DIAGNOSIS — Z87891 Personal history of nicotine dependence: Secondary | ICD-10-CM | POA: Diagnosis not present

## 2020-01-02 DIAGNOSIS — L89312 Pressure ulcer of right buttock, stage 2: Secondary | ICD-10-CM | POA: Diagnosis not present

## 2020-01-02 DIAGNOSIS — Z466 Encounter for fitting and adjustment of urinary device: Secondary | ICD-10-CM | POA: Diagnosis not present

## 2020-01-02 DIAGNOSIS — B965 Pseudomonas (aeruginosa) (mallei) (pseudomallei) as the cause of diseases classified elsewhere: Secondary | ICD-10-CM | POA: Diagnosis not present

## 2020-01-02 DIAGNOSIS — K219 Gastro-esophageal reflux disease without esophagitis: Secondary | ICD-10-CM | POA: Diagnosis not present

## 2020-01-02 DIAGNOSIS — N319 Neuromuscular dysfunction of bladder, unspecified: Secondary | ICD-10-CM | POA: Diagnosis not present

## 2020-01-02 DIAGNOSIS — N139 Obstructive and reflux uropathy, unspecified: Secondary | ICD-10-CM | POA: Diagnosis not present

## 2020-01-02 DIAGNOSIS — G4733 Obstructive sleep apnea (adult) (pediatric): Secondary | ICD-10-CM | POA: Diagnosis not present

## 2020-01-02 DIAGNOSIS — E7800 Pure hypercholesterolemia, unspecified: Secondary | ICD-10-CM | POA: Diagnosis not present

## 2020-01-02 DIAGNOSIS — E785 Hyperlipidemia, unspecified: Secondary | ICD-10-CM | POA: Diagnosis not present

## 2020-01-07 DIAGNOSIS — E1140 Type 2 diabetes mellitus with diabetic neuropathy, unspecified: Secondary | ICD-10-CM | POA: Diagnosis not present

## 2020-01-07 DIAGNOSIS — N139 Obstructive and reflux uropathy, unspecified: Secondary | ICD-10-CM | POA: Diagnosis not present

## 2020-01-07 DIAGNOSIS — N390 Urinary tract infection, site not specified: Secondary | ICD-10-CM | POA: Diagnosis not present

## 2020-01-07 DIAGNOSIS — B965 Pseudomonas (aeruginosa) (mallei) (pseudomallei) as the cause of diseases classified elsewhere: Secondary | ICD-10-CM | POA: Diagnosis not present

## 2020-01-07 DIAGNOSIS — L89312 Pressure ulcer of right buttock, stage 2: Secondary | ICD-10-CM | POA: Diagnosis not present

## 2020-01-07 DIAGNOSIS — N319 Neuromuscular dysfunction of bladder, unspecified: Secondary | ICD-10-CM | POA: Diagnosis not present

## 2020-01-08 DIAGNOSIS — B965 Pseudomonas (aeruginosa) (mallei) (pseudomallei) as the cause of diseases classified elsewhere: Secondary | ICD-10-CM | POA: Diagnosis not present

## 2020-01-08 DIAGNOSIS — N319 Neuromuscular dysfunction of bladder, unspecified: Secondary | ICD-10-CM | POA: Diagnosis not present

## 2020-01-08 DIAGNOSIS — N139 Obstructive and reflux uropathy, unspecified: Secondary | ICD-10-CM | POA: Diagnosis not present

## 2020-01-08 DIAGNOSIS — L89312 Pressure ulcer of right buttock, stage 2: Secondary | ICD-10-CM | POA: Diagnosis not present

## 2020-01-08 DIAGNOSIS — E1140 Type 2 diabetes mellitus with diabetic neuropathy, unspecified: Secondary | ICD-10-CM | POA: Diagnosis not present

## 2020-01-08 DIAGNOSIS — N390 Urinary tract infection, site not specified: Secondary | ICD-10-CM | POA: Diagnosis not present

## 2020-01-10 ENCOUNTER — Ambulatory Visit: Payer: Medicare Other

## 2020-01-10 DIAGNOSIS — N319 Neuromuscular dysfunction of bladder, unspecified: Secondary | ICD-10-CM | POA: Diagnosis not present

## 2020-01-10 DIAGNOSIS — R8290 Unspecified abnormal findings in urine: Secondary | ICD-10-CM | POA: Diagnosis not present

## 2020-01-14 DIAGNOSIS — L89312 Pressure ulcer of right buttock, stage 2: Secondary | ICD-10-CM | POA: Diagnosis not present

## 2020-01-14 DIAGNOSIS — N319 Neuromuscular dysfunction of bladder, unspecified: Secondary | ICD-10-CM | POA: Diagnosis not present

## 2020-01-14 DIAGNOSIS — R270 Ataxia, unspecified: Secondary | ICD-10-CM | POA: Diagnosis not present

## 2020-01-14 DIAGNOSIS — E1140 Type 2 diabetes mellitus with diabetic neuropathy, unspecified: Secondary | ICD-10-CM | POA: Diagnosis not present

## 2020-01-14 DIAGNOSIS — B965 Pseudomonas (aeruginosa) (mallei) (pseudomallei) as the cause of diseases classified elsewhere: Secondary | ICD-10-CM | POA: Diagnosis not present

## 2020-01-14 DIAGNOSIS — G629 Polyneuropathy, unspecified: Secondary | ICD-10-CM | POA: Diagnosis not present

## 2020-01-14 DIAGNOSIS — N390 Urinary tract infection, site not specified: Secondary | ICD-10-CM | POA: Diagnosis not present

## 2020-01-14 DIAGNOSIS — G5601 Carpal tunnel syndrome, right upper limb: Secondary | ICD-10-CM | POA: Diagnosis not present

## 2020-01-14 DIAGNOSIS — N139 Obstructive and reflux uropathy, unspecified: Secondary | ICD-10-CM | POA: Diagnosis not present

## 2020-01-15 DIAGNOSIS — N319 Neuromuscular dysfunction of bladder, unspecified: Secondary | ICD-10-CM | POA: Diagnosis not present

## 2020-01-15 DIAGNOSIS — B965 Pseudomonas (aeruginosa) (mallei) (pseudomallei) as the cause of diseases classified elsewhere: Secondary | ICD-10-CM | POA: Diagnosis not present

## 2020-01-15 DIAGNOSIS — E1140 Type 2 diabetes mellitus with diabetic neuropathy, unspecified: Secondary | ICD-10-CM | POA: Diagnosis not present

## 2020-01-15 DIAGNOSIS — L89312 Pressure ulcer of right buttock, stage 2: Secondary | ICD-10-CM | POA: Diagnosis not present

## 2020-01-15 DIAGNOSIS — N139 Obstructive and reflux uropathy, unspecified: Secondary | ICD-10-CM | POA: Diagnosis not present

## 2020-01-15 DIAGNOSIS — N390 Urinary tract infection, site not specified: Secondary | ICD-10-CM | POA: Diagnosis not present

## 2020-01-18 ENCOUNTER — Ambulatory Visit: Payer: Medicare Other | Attending: Internal Medicine

## 2020-01-18 DIAGNOSIS — Z23 Encounter for immunization: Secondary | ICD-10-CM | POA: Insufficient documentation

## 2020-01-18 NOTE — Progress Notes (Signed)
   Covid-19 Vaccination Clinic  Name:  Matthew Nash    MRN: [id number] DOB: [birth year]  01/18/2020  Mr. Matthew Nash was observed post Covid-19 immunization for 15 minutes without incidence. He was provided with Vaccine Information Sheet and instruction to access the V-Safe system.   Mr. Matthew Nash was instructed to call 911 with any severe reactions post vaccine: [name] Kitchen Difficulty breathing  . Swelling of your face and throat  . A fast heartbeat  . A bad rash all over your body  . Dizziness and weakness    Immunizations Administered    Name Date Dose VIS Date Route   Pfizer COVID-19 Vaccine 01/18/2020  4:04 PM 0.3 mL 11/22/2019 Intramuscular   Manufacturer: [place]   Lot: [id number]   [place]: [id number]

## 2020-01-20 DIAGNOSIS — N139 Obstructive and reflux uropathy, unspecified: Secondary | ICD-10-CM | POA: Diagnosis not present

## 2020-01-20 DIAGNOSIS — N390 Urinary tract infection, site not specified: Secondary | ICD-10-CM | POA: Diagnosis not present

## 2020-01-20 DIAGNOSIS — N319 Neuromuscular dysfunction of bladder, unspecified: Secondary | ICD-10-CM | POA: Diagnosis not present

## 2020-01-20 DIAGNOSIS — E1140 Type 2 diabetes mellitus with diabetic neuropathy, unspecified: Secondary | ICD-10-CM | POA: Diagnosis not present

## 2020-01-20 DIAGNOSIS — L89312 Pressure ulcer of right buttock, stage 2: Secondary | ICD-10-CM | POA: Diagnosis not present

## 2020-01-20 DIAGNOSIS — B965 Pseudomonas (aeruginosa) (mallei) (pseudomallei) as the cause of diseases classified elsewhere: Secondary | ICD-10-CM | POA: Diagnosis not present

## 2020-01-21 ENCOUNTER — Ambulatory Visit: Payer: Medicare Other

## 2020-01-22 DIAGNOSIS — N390 Urinary tract infection, site not specified: Secondary | ICD-10-CM | POA: Diagnosis not present

## 2020-01-22 DIAGNOSIS — B965 Pseudomonas (aeruginosa) (mallei) (pseudomallei) as the cause of diseases classified elsewhere: Secondary | ICD-10-CM | POA: Diagnosis not present

## 2020-01-22 DIAGNOSIS — E1140 Type 2 diabetes mellitus with diabetic neuropathy, unspecified: Secondary | ICD-10-CM | POA: Diagnosis not present

## 2020-01-22 DIAGNOSIS — N139 Obstructive and reflux uropathy, unspecified: Secondary | ICD-10-CM | POA: Diagnosis not present

## 2020-01-22 DIAGNOSIS — N319 Neuromuscular dysfunction of bladder, unspecified: Secondary | ICD-10-CM | POA: Diagnosis not present

## 2020-01-22 DIAGNOSIS — L89312 Pressure ulcer of right buttock, stage 2: Secondary | ICD-10-CM | POA: Diagnosis not present

## 2020-01-28 DIAGNOSIS — B965 Pseudomonas (aeruginosa) (mallei) (pseudomallei) as the cause of diseases classified elsewhere: Secondary | ICD-10-CM | POA: Diagnosis not present

## 2020-01-28 DIAGNOSIS — N319 Neuromuscular dysfunction of bladder, unspecified: Secondary | ICD-10-CM | POA: Diagnosis not present

## 2020-01-28 DIAGNOSIS — N139 Obstructive and reflux uropathy, unspecified: Secondary | ICD-10-CM | POA: Diagnosis not present

## 2020-01-28 DIAGNOSIS — N390 Urinary tract infection, site not specified: Secondary | ICD-10-CM | POA: Diagnosis not present

## 2020-01-28 DIAGNOSIS — E1140 Type 2 diabetes mellitus with diabetic neuropathy, unspecified: Secondary | ICD-10-CM | POA: Diagnosis not present

## 2020-01-28 DIAGNOSIS — L89312 Pressure ulcer of right buttock, stage 2: Secondary | ICD-10-CM | POA: Diagnosis not present

## 2020-01-31 ENCOUNTER — Encounter: Payer: Self-pay | Admitting: Family Medicine

## 2020-01-31 DIAGNOSIS — L89312 Pressure ulcer of right buttock, stage 2: Secondary | ICD-10-CM | POA: Diagnosis not present

## 2020-01-31 DIAGNOSIS — E1140 Type 2 diabetes mellitus with diabetic neuropathy, unspecified: Secondary | ICD-10-CM | POA: Diagnosis not present

## 2020-01-31 DIAGNOSIS — B965 Pseudomonas (aeruginosa) (mallei) (pseudomallei) as the cause of diseases classified elsewhere: Secondary | ICD-10-CM | POA: Diagnosis not present

## 2020-01-31 DIAGNOSIS — N139 Obstructive and reflux uropathy, unspecified: Secondary | ICD-10-CM | POA: Diagnosis not present

## 2020-01-31 DIAGNOSIS — N390 Urinary tract infection, site not specified: Secondary | ICD-10-CM | POA: Diagnosis not present

## 2020-01-31 DIAGNOSIS — N319 Neuromuscular dysfunction of bladder, unspecified: Secondary | ICD-10-CM | POA: Diagnosis not present

## 2020-02-05 ENCOUNTER — Encounter: Payer: Self-pay | Admitting: Family Medicine

## 2020-02-09 NOTE — Progress Notes (Signed)
[facility] at [facility] [address], [place], [place] [address] [id number] [phone]  Date:  02/10/2020   Name:  Matthew Nash   DOB:  12/25/1935   MRN:  [id number]  PCP:  Darreld Mclean, MD    Chief Complaint: No chief complaint on file.   History of Present Illness:  Matthew Nash is a 84 y.o. very pleasant male patient who presents with the following:  Virtual visit today for follow-up-telephone Patient with history of hypertension chronic kidney disease, sleep apnea, diabetes, gait disorder/neuropathy/neurogenic bladder, REM behavioral disorder  Patient location is home, provider location is office.  Patient identity confirmed with 2 factors, he gives consent for virtual visit today.  The patient and myself are present on the call today Last seen by myself most recently in mid January.  He was admitted at [facility] in November with urosepsis, then went to rehab during which time he developed a pressure ulcer  He is currently being evaluated by Dr. Bland Span at [facility] possible Parkinson's disease They are doing a trial of Sinemet His urologist is Dr. Venia Minks also with [facility] visit with him in January He currently has an indwelling Foley catheter-previously he was doing intermittent self cath at home.  More recently he has felt better with an indwelling.  He is seeing Dr Venia Minks tomorrow  His main concern today is his living situation He is interested in moving into a nursing facility- he is concerned about his wife having too much to do.   They would both like to move to a facility that will allow him to stay together.  However, [name] is not sure if he needs assisted living or independent living at this time He needs help with dressing, getting undressed, and meals, and cleaning up after bowel movements.  He is able to bathe and manage his medications He checked his glucose today- it was 98  Lab  Results  Component Value Date   HGBA1C 6.8 (H) 06/08/2019     Patient Active Problem List   Diagnosis Date Noted  . HTN (hypertension) 06/07/2019  . AKI (acute kidney injury) ([place]) 06/07/2019  . Elevated troponin 06/07/2019  . Acute respiratory failure with hypoxia ([place]) 06/06/2019  . Chronic kidney disease 11/25/2018  . Ischemic chest pain ([place]) 11/25/2018  . Chest pain 11/25/2018  . Pelvic floor dysfunction 04/04/2018  . OSA on CPAP 01/12/2018  . REM behavioral disorder 01/12/2018  . Diarrhea 09/15/2017  . Incontinence of feces 09/15/2017  . Peripheral neuropathy 10/20/2016  . Bladder outlet obstruction 05/02/2016  . OA (osteoarthritis) of knee 05/27/2015  . Diabetes mellitus type II, controlled ([place]) 11/26/2014  . Gastroesophageal reflux disease without esophagitis 11/26/2014  . Arthritis of both knees 11/26/2014  . Absence of bladder continence 11/26/2014  . Hyperlipidemia LDL goal <100 11/26/2014  . Acute medial meniscal tear 07/16/2014  . Lumbosacral spondylosis without myelopathy 02/17/2014  . Spondylosis, lumbosacral 02/17/2014    Past Medical History:  Diagnosis Date  . Arthritis    both knees  . Back pain at L4-L5 level   . Basal cell carcinoma   . BPH (benign prostatic hyperplasia)    hx s/p turp  . Chronic back pain    spondylosis  . Chronic kidney disease    kidney stones 1979  . DDD (degenerative disc disease), lumbar   . Diabetes mellitus    takes Metformin and Victoza daily-under control  . Diarrhea  x 1 today  . Diverticulitis   . Gallstones   . GERD (gastroesophageal reflux disease)    takes Omeprazole daily-under control  . H/O hiatal hernia   . H/O measles   . Hepatitis B    B-with Mono and jaundice 1960  . History of chicken pox   . History of colon polyps   . History of kidney stones   . Hyperlipidemia    takes Simvastatin and Tricor daily-under control  . Hypertension    takes Lisinopril daily-under control  . Joint pain     both knees  . Joint swelling    rt knee  . Kidney stones   . Melanoma ([place])    right hand;basal cell carcinoma  . Neuropathy   . Peripheral vascular disease ([place])    diabetic neuropathy  . Pneumonia    hx of  . Prostate infection    currently taking macrobid  . Sleep apnea with use of continuous positive airway pressure (CPAP)   . Tinnitus   . Urinary incontinence   . UTI (lower urinary tract infection)     Past Surgical History:  Procedure Laterality Date  . BASAL CELL CARCINOMA EXCISION    . BOTOX INJECTION  03/22/2017   In the bladder for nightly incontinence.  . C5 and T1 bone chip removed   1986  . [place]  . CHOLECYSTECTOMY  1993  . COLONOSCOPY  2011  . cortisone injection    . EYE SURGERY     cataract sx 2017 bilateral  . I&D left knee Left 1958   states 22 times  . KNEE ARTHROSCOPY Right 07/16/2014   Procedure: RIGHT ARTHROSCOPY KNEE WITH Medial and Lateral DEBRIDEMENT and chondroplasty;  Surgeon: Gearlean Alf, MD;  [facility];  Service: Orthopedics;  Laterality: Right;  . LUMBAR LAMINECTOMY/DECOMPRESSION MICRODISCECTOMY Right 02/17/2014   Procedure: RIGHT LUMBAR TWO-THREE LAMINECTOMY;  Surgeon: Charlie Pitter, MD;  Location: [facility];  Service: Neurosurgery;  Laterality: Right;  right   . right shoulder surgery  2012   rotator cuff repair  . SHOULDER ARTHROSCOPY W/ ROTATOR CUFF REPAIR Left NOV 2010  . TONSILLECTOMY  1942  . TOTAL KNEE ARTHROPLASTY Right 05/27/2015   Procedure: RIGHT TOTAL KNEE ARTHROPLASTY;  Surgeon: Gaynelle Arabian, MD;  [facility];  Service: Orthopedics;  Laterality: Right;  . TOTAL KNEE ARTHROPLASTY Left 05/30/2016   Procedure: LEFT TOTAL KNEE ARTHROPLASTY;  Surgeon: Gaynelle Arabian, MD;  [facility];  Service: Orthopedics;  Laterality: Left;  . TRANSURETHRAL RESECTION OF PROSTATE  2006  . VASECTOMY  1983  . WISDOM TOOTH EXTRACTION      Social History   Tobacco Use  . Smoking status: Former Smoker     Types: Pipe    Quit date: 07/14/1978    Years since quitting: 41.6  . Smokeless tobacco: Never Used  . Tobacco comment: quit smoking 1979  Substance Use Topics  . Alcohol use: Yes    Comment: OCCASIONAL  . Drug use: No    Family History  Problem Relation Age of Onset  . Heart disease Mother 62       Deceased  . Bladder Cancer Mother   . Breast cancer Mother   . Prostate cancer Father        Deceased-in 65s  . Healthy Brother   . Skin cancer Brother        "basal cell cancer in the bloodstream"  . Asthma Sister  Deceased  . Healthy Son        #1  . Healthy Daughter        #2  . Obesity Son        #2    Allergies  Allergen Reactions  . Sulfamethoxazole-Trimethoprim Diarrhea    Severe diarrhea  . Albamycin [Novobiocin] Itching and Rash  . Penicillins Rash and Other (See Comments)    Has patient had a PCN reaction causing immediate rash, facial/tongue/throat swelling, SOB or lightheadedness with hypotension:  no Has patient had a PCN reaction causing severe rash involving mucus membranes or skin necrosis: no Has patient had a PCN reaction that required hospitalization no Has patient had a PCN reaction occurring within the last 10 years: no - childhood reaction If all of the above answers are "NO", then may proceed with Cephalosporin use.     Medication list has been reviewed and updated.  Current Outpatient Medications on File Prior to Visit  Medication Sig Dispense Refill  . Alpha-Lipoic Acid 600 MG TABS Take by mouth.    [name] Kitchen aspirin EC 81 MG tablet Take 81 mg by mouth daily.    [name] Kitchen CALCIUM-MAGNESIUM-VITAMIN D PO Take 2 tablets by mouth 2 (two) times daily.     . clonazePAM (KLONOPIN) 0.5 MG tablet Take 0.5 tablets (0.25 mg total) by mouth at bedtime. 90 tablet 0  . collagenase (SANTYL) ointment Apply 1 application topically 2 (two) times daily. 30 g 0  . FIBER PO Take 3 capsules by mouth 2 (two) times daily.     . Melatonin 10 MG TABS Take 1 tablet by mouth at  bedtime.    . Multiple Vitamin (MULTI-VITAMINS) TABS Take 1 tablet by mouth daily.     [name] Kitchen omeprazole (PRILOSEC) 20 MG capsule TAKE 1 CAPSULE DAILY (Patient taking differently: Take 20 mg by mouth every morning. ) 90 capsule 3  . Polyethylene Glycol 3350 (MIRALAX PO) Take 17 g by mouth daily.     . Probiotic Product (PROBIOTIC ADVANCED PO) Take 1 tablet by mouth daily.     . simvastatin (ZOCOR) 10 MG tablet Take 1 tablet (10 mg total) by mouth at bedtime. 90 tablet 3  . TRICOR 145 MG tablet TAKE 1 TABLET EVERY EVENING 90 tablet 3  . vitamin C (ASCORBIC ACID) 500 MG tablet Take 500 mg by mouth daily.     No current facility-administered medications on file prior to visit.    Review of Systems:  As per HPI- otherwise negative.   Physical Examination: There were no vitals filed for this visit. There were no vitals filed for this visit. There is no height or weight on file to calculate BMI. Ideal Body Weight:    Discussed with patient on the telephone.  He sounds well, no cough, wheezing, distress is noted  Assessment and Plan: Frail elderly  Controlled type 2 diabetes mellitus without complication, without long-term current use of insulin (HCC)  Hyperlipidemia LDL goal <100  Tremor of right hand  Polyneuropathy associated with underlying disease ([place])  Phone visit today to discuss possible change in living situation.  Matthew Nash and his wife currently live at home, his wife is his main caretaker.  Matthew Nash worries that this is too much for her, we discussed that possibly moving to a senior living facility.  A place that offers both independent and assisted living would be ideal.  We discussed a couple of options, I encouraged him to tell staff exactly what he needs and see if they can  advise him about level of care.  I would think that independent living with a couple of hours of in-home services per day might be ideal He would let me know what else I can do to help Spoke to pt for 10:20  today on the phone  Signed Lamar Blinks, MD

## 2020-02-10 ENCOUNTER — Ambulatory Visit (INDEPENDENT_AMBULATORY_CARE_PROVIDER_SITE_OTHER): Payer: Medicare Other | Admitting: Family Medicine

## 2020-02-10 ENCOUNTER — Other Ambulatory Visit: Payer: Self-pay

## 2020-02-10 ENCOUNTER — Encounter: Payer: Self-pay | Admitting: Family Medicine

## 2020-02-10 DIAGNOSIS — R54 Age-related physical debility: Secondary | ICD-10-CM | POA: Diagnosis not present

## 2020-02-10 DIAGNOSIS — E785 Hyperlipidemia, unspecified: Secondary | ICD-10-CM

## 2020-02-10 DIAGNOSIS — G63 Polyneuropathy in diseases classified elsewhere: Secondary | ICD-10-CM

## 2020-02-10 DIAGNOSIS — E119 Type 2 diabetes mellitus without complications: Secondary | ICD-10-CM

## 2020-02-10 DIAGNOSIS — R251 Tremor, unspecified: Secondary | ICD-10-CM

## 2020-02-11 DIAGNOSIS — N319 Neuromuscular dysfunction of bladder, unspecified: Secondary | ICD-10-CM | POA: Diagnosis not present

## 2020-02-12 ENCOUNTER — Ambulatory Visit: Payer: Medicare Other | Attending: Internal Medicine

## 2020-02-12 DIAGNOSIS — Z23 Encounter for immunization: Secondary | ICD-10-CM | POA: Insufficient documentation

## 2020-02-12 NOTE — Progress Notes (Signed)
   Covid-19 Vaccination Clinic  Name:  Matthew Nash    MRN: [id number] DOB: [birth year]  02/12/2020  [name] was observed post Covid-19 immunization for 15 minutes without incident. He was provided with Vaccine Information Sheet and instruction to access the V-Safe system.   [name] was instructed to call 911 with any severe reactions post vaccine: [name] Kitchen Difficulty breathing  . Swelling of face and throat  . A fast heartbeat  . A bad rash all over body  . Dizziness and weakness   Immunizations Administered    Name Date Dose VIS Date Route   Pfizer COVID-19 Vaccine 02/12/2020  2:36 PM 0.3 mL 11/22/2019 Intramuscular   Manufacturer: [place]   Lot: [id number]   [place]: [id number]

## 2020-02-26 ENCOUNTER — Telehealth: Payer: Self-pay | Admitting: Family Medicine

## 2020-02-26 NOTE — Progress Notes (Signed)
  Chronic Care Management   Note  02/26/2020 Name: KEESHAUN WIGGINGTON MRN: [id number] DOB: 1936-04-01  TANYA RUNNING is a 84 y.o. year old male who is a primary care patient of Copland, Gay Filler, MD. I reached out to Nita Sells by phone today in response to a referral sent by Mr. Raford Bazinet Ragain's PCP, Copland, Gay Filler, MD.   Mr. Huml was given information about Chronic Care Management services today including:  1. CCM service includes personalized support from designated clinical staff supervised by his physician, including individualized plan of care and coordination with other care providers 2. 24/7 contact phone numbers for assistance for urgent and routine care needs. 3. Service will only be billed when office clinical staff spend 20 minutes or more in a month to coordinate care. 4. Only one practitioner may furnish and bill the service in a calendar month. 5. The patient may stop CCM services at any time (effective at the end of the month) by phone call to the office staff.   Patient agreed to services and verbal consent obtained.   Follow up plan:   Raynicia Dukes [employer] Scheduler

## 2020-03-13 DIAGNOSIS — Z466 Encounter for fitting and adjustment of urinary device: Secondary | ICD-10-CM | POA: Diagnosis not present

## 2020-03-13 DIAGNOSIS — N318 Other neuromuscular dysfunction of bladder: Secondary | ICD-10-CM | POA: Diagnosis not present

## 2020-03-13 DIAGNOSIS — N319 Neuromuscular dysfunction of bladder, unspecified: Secondary | ICD-10-CM | POA: Diagnosis not present

## 2020-03-19 ENCOUNTER — Telehealth: Payer: Medicare Other

## 2020-03-20 ENCOUNTER — Encounter: Payer: Self-pay | Admitting: Family Medicine

## 2020-03-20 MED ORDER — OMEPRAZOLE 20 MG PO CPDR: 20.0000 mg | DELAYED_RELEASE_CAPSULE | Freq: Every day | ORAL | 3 refills | Status: AC

## 2020-04-15 DIAGNOSIS — N390 Urinary tract infection, site not specified: Secondary | ICD-10-CM | POA: Diagnosis not present

## 2020-04-15 DIAGNOSIS — N319 Neuromuscular dysfunction of bladder, unspecified: Secondary | ICD-10-CM | POA: Diagnosis not present

## 2020-04-15 NOTE — Progress Notes (Signed)
This visit is being conducted via phone call  - after an attmept to do on video chat - due to the COVID-19 pandemic. This patient has given me verbal consent via phone to conduct this visit, patient states they are participating from their home address. Some vital signs may be absent or patient reported.   Patient identification: identified by name, DOB, and current address.    Subjective:   Matthew Nash is a 84 y.o. male who presents for Medicare Annual/Subsequent preventive examination.  Review of Systems:    Home Safety/Smoke Alarms: Feels safe in home. Smoke alarms in place.  Lives w/ wife. In independent living at [facility].. Walk in shower. Uses walker or wheelchair.  Male:   PSA-  Lab Results  Component Value Date   PSA 1.36 02/15/2016       Objective:    Vitals:Unable to assess. This visit is enabled though telemedicine due to Covid 19.   Advanced Directives 04/16/2020 07/10/2019 06/07/2019 06/06/2019 04/16/2019 11/25/2018 09/20/2017  Does Patient Have a Medical Advance Directive? Yes Yes Yes Yes Yes Yes Yes  Type of Paramedic of [place];Living will [place];Living will Living will;Healthcare Power of [place];Living will [place];Living will Living will  Does patient want to make changes to medical advance directive? No - Patient declined - No - Patient declined - No - Patient declined - -  Copy of [place] in Chart? Yes - validated most recent copy scanned in chart (See row information) No - copy requested - - Yes - validated most recent copy scanned in chart (See row information) - -  Would patient like information on creating a medical advance directive? - - - - - - -  Pre-existing out of facility DNR order (yellow form or pink MOST form) - - - - - - -    Tobacco Social History   Tobacco Use  Smoking Status Former Smoker  . Types: Pipe  . Quit  date: 07/14/1978  . Years since quitting: 41.7  Smokeless Tobacco Never Used  Tobacco Comment   quit smoking 1979     Counseling given: Not Answered Comment: quit smoking 1979   Clinical Intake: Pain : No/denies pain      Past Medical History:  Diagnosis Date  . Arthritis    both knees  . Back pain at L4-L5 level   . Basal cell carcinoma   . BPH (benign prostatic hyperplasia)    hx s/p turp  . Chronic back pain    spondylosis  . Chronic kidney disease    kidney stones 1979  . DDD (degenerative disc disease), lumbar   . Diabetes mellitus    takes Metformin and Victoza daily-under control  . Diarrhea    x 1 today  . Diverticulitis   . Gallstones   . GERD (gastroesophageal reflux disease)    takes Omeprazole daily-under control  . H/O hiatal hernia   . H/O measles   . Hepatitis B    B-with Mono and jaundice 1960  . History of chicken pox   . History of colon polyps   . History of kidney stones   . Hyperlipidemia    takes Simvastatin and Tricor daily-under control  . Hypertension    takes Lisinopril daily-under control  . Joint pain    both knees  . Joint swelling    rt knee  . Kidney stones   . Melanoma ([place])  right hand;basal cell carcinoma  . Neuropathy   . Peripheral vascular disease ([place])    diabetic neuropathy  . Pneumonia    hx of  . Prostate infection    currently taking macrobid  . Sleep apnea with use of continuous positive airway pressure (CPAP)   . Tinnitus   . Urinary incontinence   . UTI (lower urinary tract infection)    Past Surgical History:  Procedure Laterality Date  . BASAL CELL CARCINOMA EXCISION    . BOTOX INJECTION  03/22/2017   In the bladder for nightly incontinence.  . C5 and T1 bone chip removed   1986  . [place]  . CHOLECYSTECTOMY  1993  . COLONOSCOPY  2011  . cortisone injection    . EYE SURGERY     cataract sx 2017 bilateral  . I&D left knee Left 1958   states 22 times  . KNEE ARTHROSCOPY  Right 07/16/2014   Procedure: RIGHT ARTHROSCOPY KNEE WITH Medial and Lateral DEBRIDEMENT and chondroplasty;  Surgeon: Gearlean Alf, MD;  Location: [facility];  Service: Orthopedics;  Laterality: Right;  . LUMBAR LAMINECTOMY/DECOMPRESSION MICRODISCECTOMY Right 02/17/2014   Procedure: RIGHT LUMBAR TWO-THREE LAMINECTOMY;  Surgeon: Charlie Pitter, MD;  Location: [facility];  Service: Neurosurgery;  Laterality: Right;  right   . right shoulder surgery  2012   rotator cuff repair  . SHOULDER ARTHROSCOPY W/ ROTATOR CUFF REPAIR Left NOV 2010  . TONSILLECTOMY  1942  . TOTAL KNEE ARTHROPLASTY Right 05/27/2015   Procedure: RIGHT TOTAL KNEE ARTHROPLASTY;  Surgeon: Gaynelle Arabian, MD;  Location: [facility];  Service: Orthopedics;  Laterality: Right;  . TOTAL KNEE ARTHROPLASTY Left 05/30/2016   Procedure: LEFT TOTAL KNEE ARTHROPLASTY;  Surgeon: Gaynelle Arabian, MD;  Location: [facility];  Service: Orthopedics;  Laterality: Left;  . TRANSURETHRAL RESECTION OF PROSTATE  2006  . VASECTOMY  1983  . WISDOM TOOTH EXTRACTION     Family History  Problem Relation Age of Onset  . Heart disease Mother 25       Deceased  . Bladder Cancer Mother   . Breast cancer Mother   . Prostate cancer Father        Deceased-in 8s  . Healthy Brother   . Skin cancer Brother        "basal cell cancer in the bloodstream"  . Asthma Sister        Deceased  . Healthy Son        #1  . Healthy Daughter        #2  . Obesity Son        #2   Social History   Socioeconomic History  . Marital status: Married    Spouse name: Not on file  . Number of children: 4  . Years of education: Not on file  . Highest education level: Not on file  Occupational History  . Occupation: retired  Tobacco Use  . Smoking status: Former Smoker    Types: Pipe    Quit date: 07/14/1978    Years since quitting: 41.7  . Smokeless tobacco: Never Used  . Tobacco comment: quit smoking 1979  Substance and Sexual Activity  . Alcohol use: Yes    Comment:  OCCASIONAL  . Drug use: No  . Sexual activity: Never  Other Topics Concern  . Not on file  Social History Narrative  . Not on file   Social Determinants of Health   Financial Resource Strain: Low Risk   .  Difficulty of Paying Living Expenses: Not hard at all  Food Insecurity: No Food Insecurity  . Worried About Charity fundraiser in the Last Year: Never true  . Ran Out of Food in the Last Year: Never true  Transportation Needs: No Transportation Needs  . Lack of Transportation (Medical): No  . Lack of Transportation (Non-Medical): No  Physical Activity:   . Days of Exercise per Week:   . Minutes of Exercise per Session:   Stress:   . Feeling of Stress :   Social Connections:   . Frequency of Communication with Friends and Family:   . Frequency of Social Gatherings with Friends and Family:   . Attends Religious Services:   . Active Member of Clubs or Organizations:   . Attends Archivist Meetings:   [name] Kitchen Marital Status:     Outpatient Encounter Medications as of 04/16/2020  Medication Sig  . aspirin EC 81 MG tablet Take 81 mg by mouth daily.  [name] Kitchen CALCIUM-MAGNESIUM-VITAMIN D PO Take 2 tablets by mouth 2 (two) times daily.   . clonazePAM (KLONOPIN) 0.5 MG tablet Take 0.5 tablets (0.25 mg total) by mouth at bedtime.  [name] Kitchen FIBER PO Take 3 capsules by mouth 2 (two) times daily.   . Melatonin 10 MG TABS Take 1 tablet by mouth at bedtime.  . Multiple Vitamin (MULTI-VITAMINS) TABS Take 1 tablet by mouth daily.   [name] Kitchen omeprazole (PRILOSEC) 20 MG capsule Take 1 capsule (20 mg total) by mouth daily.  . Polyethylene Glycol 3350 (MIRALAX PO) Take 17 g by mouth daily.   . Probiotic Product (PROBIOTIC ADVANCED PO) Take 1 tablet by mouth daily.   . simvastatin (ZOCOR) 10 MG tablet Take 1 tablet (10 mg total) by mouth at bedtime.  . TRICOR 145 MG tablet TAKE 1 TABLET EVERY EVENING  . vitamin C (ASCORBIC ACID) 500 MG tablet Take 500 mg by mouth daily.  . [DISCONTINUED] Alpha-Lipoic Acid 600  MG TABS Take by mouth.  . [DISCONTINUED] collagenase (SANTYL) ointment Apply 1 application topically 2 (two) times daily.   No facility-administered encounter medications on file as of 04/16/2020.    Activities of Daily Living In your present state of health, do you have any difficulty performing the following activities: 04/16/2020 06/07/2019  Hearing? N Y  Vision? N N  Difficulty concentrating or making decisions? N N  Walking or climbing stairs? Y Y  Dressing or bathing? Y Y  Doing errands, shopping? Y N  Preparing Food and eating ? Y -  Using the Toilet? Y -  In the past six months, have you accidently leaked urine? Y -  Do you have problems with loss of bowel control? N -  Managing your Medications? Y -  Managing your Finances? Y -  Housekeeping or managing your Housekeeping? Y -  Some recent data might be hidden    Patient Care Team: Copland, Gay Filler, MD as PCP - General (Family Medicine) Buford Dresser, MD as PCP - Cardiology (Cardiology) Gaynelle Arabian, MD as Consulting Physician (Orthopedic Surgery) Deirdre Pippins, PA-C as Physician Assistant (Dermatology) Laurence Aly, [place] (Optometry) Carolan Clines, MD (Inactive) as Consulting Physician (Urology) Day, Melvenia Beam, [facility] (Pharmacist)   Assessment:   This is a routine wellness examination for [place]. Physical assessment deferred to PCP.  Exercise Activities and Dietary recommendations Current Exercise Habits: The patient does not participate in regular exercise at present, Exercise limited by: neurologic condition(s) Diet (meal preparation, eat out, water intake, caffeinated beverages, dairy products, fruits and  vegetables): well balanced   Goals    . DIET - INCREASE WATER INTAKE       Fall Risk Fall Risk  04/16/2020 04/16/2019 04/12/2018 04/12/2017 04/10/2017  Falls in the past year? 1 1 Yes Yes Yes  Number falls in past yr: 0 1 1 2  or more 1  Injury with Fall? 1 1 Yes No No  Comment - - - - -  Risk Factor  Category  - - - High Fall Risk -  Risk for fall due to : Impaired balance/gait;Impaired mobility - - - Impaired balance/gait  Risk for fall due to: Comment - - - - -  Follow up Education provided;Falls prevention discussed - Education provided;Falls prevention discussed Falls prevention discussed Education provided;Falls prevention discussed   Depression Screen PHQ 2/9 Scores 04/16/2020 04/16/2019 04/12/2018 04/10/2017  PHQ - 2 Score 0 0 0 0  Exception Documentation - - - -    Cognitive Function Ad8 score reviewed for issues:  Issues making decisions:no  Less interest in hobbies / activities:no  Repeats questions, stories (family complaining):no  Trouble using ordinary gadgets (microwave, computer, phone):no  Forgets the month or year: no  Mismanaging finances: no  Remembering appts:no  Daily problems with thinking and/or memory:no Ad8 score is=0        Immunization History  Administered Date(s) Administered  . Fluad Quad(high Dose 65+) 08/14/2019  . Influenza, High Dose Seasonal PF 09/01/2016, 08/21/2017, 08/23/2018  . Influenza,inj,Quad PF,6+ Mos 08/20/2015  . Influenza-Unspecified 09/11/2014  . PFIZER SARS-COV-2 Vaccination 01/18/2020, 02/12/2020  . Pneumococcal Conjugate-13 05/13/2015  . Pneumococcal Polysaccharide-23 12/12/1998, 01/10/2018  . Td 02/15/2016    Screening Tests Health Maintenance  Topic Date Due  . HEMOGLOBIN A1C  12/08/2019  . FOOT EXAM  03/31/2020  . OPHTHALMOLOGY EXAM  04/18/2020  . INFLUENZA VACCINE  07/12/2020  . TETANUS/TDAP  02/14/2026  . COVID-19 Vaccine  Completed  . PNA vac Low Risk Adult  Completed       Plan:    Please schedule your next medicare wellness visit with me in 1 yr.   Continue to eat heart healthy diet (full of fruits, vegetables, whole grains, lean protein, water--limit salt, fat, and sugar intake) and increase physical activity as tolerated.  Continue doing brain stimulating activities (puzzles, reading, adult  coloring books, staying active) to keep memory sharp.    I have personally reviewed and noted the following in the patient's chart:   . Medical and social history . Use of alcohol, tobacco or illicit drugs  . Current medications and supplements . Functional ability and status . Nutritional status . Physical activity . Advanced directives . List of other physicians . Hospitalizations, surgeries, and ER visits in previous 12 months . Vitals . Screenings to include cognitive, depression, and falls . Referrals and appointments  In addition, I have reviewed and discussed with patient certain preventive protocols, quality metrics, and best practice recommendations. A written personalized care plan for preventive services as well as general preventive health recommendations were provided to patient.     Shela Nevin, [place]  04/16/2020

## 2020-04-16 ENCOUNTER — Other Ambulatory Visit: Payer: Self-pay

## 2020-04-16 ENCOUNTER — Encounter: Payer: Self-pay | Admitting: *Deleted

## 2020-04-16 ENCOUNTER — Ambulatory Visit (INDEPENDENT_AMBULATORY_CARE_PROVIDER_SITE_OTHER): Payer: Medicare Other | Admitting: *Deleted

## 2020-04-16 DIAGNOSIS — Z0000 Encounter for general adult medical examination without abnormal findings: Secondary | ICD-10-CM

## 2020-04-16 NOTE — Patient Instructions (Signed)
Please schedule your next medicare wellness visit with me in 1 yr.   Continue to eat heart healthy diet (full of fruits, vegetables, whole grains, lean protein, water--limit salt, fat, and sugar intake) and increase physical activity as tolerated.  Continue doing brain stimulating activities (puzzles, reading, adult coloring books, staying active) to keep memory sharp.     Mr. Matthew Nash , Thank you for taking time to come for your Medicare Wellness Visit. I appreciate your ongoing commitment to your health goals. Please review the following plan we discussed and let me know if I can assist you in the future.   These are the goals we discussed: Goals    . DIET - INCREASE WATER INTAKE       This is a list of the screening recommended for you and due dates:  Health Maintenance  Topic Date Due  . Hemoglobin A1C  12/08/2019  . Complete foot exam   03/31/2020  . Eye exam for diabetics  04/18/2020  . Flu Shot  07/12/2020  . Tetanus Vaccine  02/14/2026  . COVID-19 Vaccine  Completed  . Pneumonia vaccines  Completed   It is important to avoid accidents which may result in broken bones.  Here are a few ideas on how to make your home safer so you will be less likely to trip or fall.  1. Use nonskid mats or non slip strips in your shower or tub, on your bathroom floor and around sinks.  If you know that you have spilled water, wipe it up! 2. In the bathroom, it is important to have properly installed grab bars on the walls or on the edge of the tub.  Towel racks are NOT strong enough for you to hold onto or to pull on for support. 3. Stairs and hallways should have enough light.  Add lamps or night lights if you need ore light. 4. It is good to have handrails on both sides of the stairs if possible.  Always fix broken handrails right away. 5. It is important to see the edges of steps.  Paint the edges of outdoor steps white so you can see them better.  Put colored tape on the edge of inside  steps. 6. Throw-rugs are dangerous because they can slide.  Removing the rugs is the best idea, but if they must stay, add adhesive carpet tape to prevent slipping. 7. Do not keep things on stairs or in the halls.  Remove small furniture that blocks the halls as it may cause you to trip.  Keep telephone and electrical cords out of the way where you walk. 8. Always were sturdy, rubber-soled shoes for good support.  Never wear just socks, especially on the stairs.  Socks may cause you to slip or fall.  Do not wear full-length housecoats as you can easily trip on the bottom.  9. Place the things you use the most on the shelves that are the easiest to reach.  If you use a stepstool, make sure it is in good condition.  If you feel unsteady, DO NOT climb, ask for help. 10. If a health professional advises you to use a cane or walker, do not be ashamed.  These items can keep you from falling and breaking your bones. Preventive Care 6 Years and Older, Male Preventive care refers to lifestyle choices and visits with your health care provider that can promote health and wellness. This includes:  A yearly physical exam. This is also called an annual well  check.  Regular dental and eye exams.  Immunizations.  Screening for certain conditions.  Healthy lifestyle choices, such as diet and exercise. What can I expect for my preventive care visit? Physical exam Your health care provider will check:  Height and weight. These may be used to calculate body mass index (BMI), which is a measurement that tells if you are at a healthy weight.  Heart rate and blood pressure.  Your skin for abnormal spots. Counseling Your health care provider may ask you questions about:  Alcohol, tobacco, and drug use.  Emotional well-being.  Home and relationship well-being.  Sexual activity.  Eating habits.  History of falls.  Memory and ability to understand (cognition).  Work and work Statistician. What  immunizations do I need?  Influenza (flu) vaccine  This is recommended every year. Tetanus, diphtheria, and pertussis (Tdap) vaccine  You may need a Td booster every 10 years. Varicella (chickenpox) vaccine  You may need this vaccine if you have not already been vaccinated. Zoster (shingles) vaccine  You may need this after age 21. Pneumococcal conjugate (PCV13) vaccine  One dose is recommended after age 23. Pneumococcal polysaccharide (PPSV23) vaccine  One dose is recommended after age 7. Measles, mumps, and rubella (MMR) vaccine  You may need at least one dose of MMR if you were born in 1957 or later. You may also need a second dose. Meningococcal conjugate (MenACWY) vaccine  You may need this if you have certain conditions. Hepatitis A vaccine  You may need this if you have certain conditions or if you travel or work in places where you may be exposed to hepatitis A. Hepatitis B vaccine  You may need this if you have certain conditions or if you travel or work in places where you may be exposed to hepatitis B. Haemophilus influenzae type b (Hib) vaccine  You may need this if you have certain conditions. You may receive vaccines as individual doses or as more than one vaccine together in one shot (combination vaccines). Talk with your health care provider about the risks and benefits of combination vaccines. What tests do I need? Blood tests  Lipid and cholesterol levels. These may be checked every 5 years, or more frequently depending on your overall health.  Hepatitis C test.  Hepatitis B test. Screening  Lung cancer screening. You may have this screening every year starting at age 38 if you have a 30-pack-year history of smoking and currently smoke or have quit within the past 15 years.  Colorectal cancer screening. All adults should have this screening starting at age 77 and continuing until age 98. Your health care provider may recommend screening at age 59 if  you are at increased risk. You will have tests every 1-10 years, depending on your results and the type of screening test.  Prostate cancer screening. Recommendations will vary depending on your family history and other risks.  Diabetes screening. This is done by checking your blood sugar (glucose) after you have not eaten for a while (fasting). You may have this done every 1-3 years.  Abdominal aortic aneurysm (AAA) screening. You may need this if you are a current or former smoker.  Sexually transmitted disease (STD) testing. Follow these instructions at home: Eating and drinking  Eat a diet that includes fresh fruits and vegetables, whole grains, lean protein, and low-fat dairy products. Limit your intake of foods with high amounts of sugar, saturated fats, and salt.  Take vitamin and mineral supplements as recommended by  your health care provider.  Do not drink alcohol if your health care provider tells you not to drink.  If you drink alcohol: ? Limit how much you have to 0-2 drinks a day. ? Be aware of how much alcohol is in your drink. In the [place] one drink equals one 12 oz bottle of beer (355 mL), one 5 oz glass of wine (148 mL), or one 1 oz glass of hard liquor (44 mL). Lifestyle  Take daily care of your teeth and gums.  Stay active. Exercise for at least 30 minutes on 5 or more days each week.  Do not use any products that contain nicotine or tobacco, such as cigarettes, e-cigarettes, and chewing tobacco. If you need help quitting, ask your health care provider.  If you are sexually active, practice safe sex. Use a condom or other form of protection to prevent STIs (sexually transmitted infections).  Talk with your health care provider about taking a low-dose aspirin or statin. What's next?  Visit your health care provider once a year for a well check visit.  Ask your health care provider how often you should have your eyes and teeth checked.  Stay up to date on all  vaccines. This information is not intended to replace advice given to you by your health care provider. Make sure you discuss any questions you have with your health care provider. Document Revised: 11/22/2018 Document Reviewed: 11/22/2018 Elsevier Patient Education  2020 [employer].

## 2020-04-21 ENCOUNTER — Encounter: Payer: Self-pay | Admitting: Family Medicine

## 2020-04-21 DIAGNOSIS — E119 Type 2 diabetes mellitus without complications: Secondary | ICD-10-CM | POA: Diagnosis not present

## 2020-04-21 LAB — HM DIABETES EYE EXAM

## 2020-04-23 ENCOUNTER — Other Ambulatory Visit: Payer: Self-pay | Admitting: Family Medicine

## 2020-04-23 DIAGNOSIS — E1142 Type 2 diabetes mellitus with diabetic polyneuropathy: Secondary | ICD-10-CM | POA: Diagnosis not present

## 2020-04-23 DIAGNOSIS — G5601 Carpal tunnel syndrome, right upper limb: Secondary | ICD-10-CM | POA: Diagnosis not present

## 2020-04-23 DIAGNOSIS — G4700 Insomnia, unspecified: Secondary | ICD-10-CM

## 2020-04-23 DIAGNOSIS — R270 Ataxia, unspecified: Secondary | ICD-10-CM | POA: Diagnosis not present

## 2020-04-23 DIAGNOSIS — G20 Parkinson's disease: Secondary | ICD-10-CM | POA: Diagnosis not present

## 2020-04-23 DIAGNOSIS — G4752 REM sleep behavior disorder: Secondary | ICD-10-CM | POA: Diagnosis not present

## 2020-04-23 MED ORDER — CLONAZEPAM 0.5 MG PO TABS: 0.2500 mg | ORAL_TABLET | Freq: Every day | ORAL | 0 refills | Status: AC

## 2020-05-18 NOTE — Progress Notes (Signed)
[facility] [place], [place], [place] [address] [phone]  Date:  05/20/2020   Name:  Matthew Nash   DOB:  [birth year]   MRN:  [id number]  PCP:  Darreld Mclean, MD    Chief Complaint: Foot Swelling (bilteral foot swelling ), Rhinorrhea, and Shoulder Pain (right shoulder pain, couple months, previous rotator cuff surgery)   History of Present Illness:  Matthew Nash is a 84 y.o. very pleasant male patient who presents with the following:  Patient with history of diabetes, hypertension, sleep apnea, peripheral neuropathy, chronic kidney disease, pelvic floor dysfunction and incontinence of both urine and feces  Last seen by myself in March for telephone follow-up His urologist is Dr. Venia Minks with [facility], last visit in May for neurogenic bladder-at that time he discontinued indwelling catheter and is doing in and out caths at home.  He is continuing to do periodic self caths, this is working out okay  He is also being seen by Dr. Bland Span with [facility] neurology for concern of possible Parkinson's disease versus neuropathy-both may contribute to his ataxia They have him taking 1 mg ropinarole TID-he is not sure how much this is helping as of yet Clonazepam- taking 0.25mg  at bedtime only Melatonin- he takes all 3 at bedtime  He may feel dizzy/lightheaded esp in the am.  He would like to try holding the clonazepam which I think is fine, he is on a minimal dose  He has a lot of runny nose esp with eating.  We discussed this, unfortunately an anticholinergic agent is not really an option with his urinary retention.  He can try nonsedating antihistamine if you would like  His right shoulder is bothersome- he has to use his arms to get into his feet, he notes pain and difficulty using his right shoulder He has a right shoulder RCT repair about 8 years ago- however his doc is retired He would like to see ortho at  [facility]- will refer to Dr Veverly Fells for evaluation  Due for foot exam A1c is due-most recent labs on chart from December 24 COVID-19 series complete Shingrix  The patient and his wife noticed that his feet are very plethoric in color, they tend to swell.  Using compression socks is difficult for them due to physical factors He does not have any leg pain or foot pain Patient Active Problem List   Diagnosis Date Noted  . HTN (hypertension) 06/07/2019  . AKI (acute kidney injury) ([place]) 06/07/2019  . Elevated troponin 06/07/2019  . Acute respiratory failure with hypoxia ([place]) 06/06/2019  . Chronic kidney disease 11/25/2018  . Ischemic chest pain ([place]) 11/25/2018  . Chest pain 11/25/2018  . Pelvic floor dysfunction 04/04/2018  . OSA on CPAP 01/12/2018  . REM behavioral disorder 01/12/2018  . Diarrhea 09/15/2017  . Incontinence of feces 09/15/2017  . Peripheral neuropathy 10/20/2016  . Bladder outlet obstruction 05/02/2016  . OA (osteoarthritis) of knee 05/27/2015  . Diabetes mellitus type II, controlled ([place]) 11/26/2014  . Gastroesophageal reflux disease without esophagitis 11/26/2014  . Arthritis of both knees 11/26/2014  . Absence of bladder continence 11/26/2014  . Hyperlipidemia LDL goal <100 11/26/2014  . Acute medial meniscal tear 07/16/2014  . Lumbosacral spondylosis without myelopathy 02/17/2014  . Spondylosis, lumbosacral 02/17/2014    Past Medical History:  Diagnosis Date  . Arthritis    both knees  . Back pain at L4-L5 level   .  Basal cell carcinoma   . BPH (benign prostatic hyperplasia)    hx s/p turp  . Chronic back pain    spondylosis  . Chronic kidney disease    kidney stones 1979  . DDD (degenerative disc disease), lumbar   . Diabetes mellitus    takes Metformin and Victoza daily-under control  . Diarrhea    x 1 today  . Diverticulitis   . Gallstones   . GERD (gastroesophageal reflux disease)    takes Omeprazole daily-under control  . H/O hiatal hernia    . H/O measles   . Hepatitis B    B-with Mono and jaundice 1960  . History of chicken pox   . History of colon polyps   . History of kidney stones   . Hyperlipidemia    takes Simvastatin and Tricor daily-under control  . Hypertension    takes Lisinopril daily-under control  . Joint pain    both knees  . Joint swelling    rt knee  . Kidney stones   . Melanoma ([place])    right hand;basal cell carcinoma  . Neuropathy   . Peripheral vascular disease ([place])    diabetic neuropathy  . Pneumonia    hx of  . Prostate infection    currently taking macrobid  . Sleep apnea with use of continuous positive airway pressure (CPAP)   . Tinnitus   . Urinary incontinence   . UTI (lower urinary tract infection)     Past Surgical History:  Procedure Laterality Date  . BASAL CELL CARCINOMA EXCISION    . BOTOX INJECTION  03/22/2017   In the bladder for nightly incontinence.  . C5 and T1 bone chip removed   1986  . [place]  . CHOLECYSTECTOMY  1993  . COLONOSCOPY  2011  . cortisone injection    . EYE SURGERY     cataract sx 2017 bilateral  . I&D left knee Left 1958   states 22 times  . KNEE ARTHROSCOPY Right 07/16/2014   Procedure: RIGHT ARTHROSCOPY KNEE WITH Medial and Lateral DEBRIDEMENT and chondroplasty;  Surgeon: Gearlean Alf, MD;  Location: [facility];  Service: Orthopedics;  Laterality: Right;  . LUMBAR LAMINECTOMY/DECOMPRESSION MICRODISCECTOMY Right 02/17/2014   Procedure: RIGHT LUMBAR TWO-THREE LAMINECTOMY;  Surgeon: Charlie Pitter, MD;  Location: [facility];  Service: Neurosurgery;  Laterality: Right;  right   . right shoulder surgery  2012   rotator cuff repair  . SHOULDER ARTHROSCOPY W/ ROTATOR CUFF REPAIR Left NOV 2010  . TONSILLECTOMY  1942  . TOTAL KNEE ARTHROPLASTY Right 05/27/2015   Procedure: RIGHT TOTAL KNEE ARTHROPLASTY;  Surgeon: Gaynelle Arabian, MD;  [facility]: [facility];  Service: Orthopedics;  Laterality: Right;  . TOTAL KNEE ARTHROPLASTY Left 05/30/2016    Procedure: LEFT TOTAL KNEE ARTHROPLASTY;  Surgeon: Gaynelle Arabian, MD;  [facility];  Service: Orthopedics;  Laterality: Left;  . TRANSURETHRAL RESECTION OF PROSTATE  2006  . VASECTOMY  1983  . WISDOM TOOTH EXTRACTION      Social History   Tobacco Use  . Smoking status: Former Smoker    Types: Pipe    Quit date: 07/14/1978    Years since quitting: 41.8  . Smokeless tobacco: Never Used  . Tobacco comment: quit smoking 1979  Substance Use Topics  . Alcohol use: Yes    Comment: OCCASIONAL  . Drug use: No    Family History  Problem Relation Age of Onset  . Heart disease Mother 4  Deceased  . Bladder Cancer Mother   . Breast cancer Mother   . Prostate cancer Father        Deceased-in 22s  . Healthy Brother   . Skin cancer Brother        "basal cell cancer in the bloodstream"  . Asthma Sister        Deceased  . Healthy Son        #1  . Healthy Daughter        #2  . Obesity Son        #2    Allergies  Allergen Reactions  . Novobiocin Itching and Rash  . Sulfamethoxazole-Trimethoprim Diarrhea    Severe diarrhea  . Penicillins Rash and Other (See Comments)    Has patient had a PCN reaction causing immediate rash, facial/tongue/throat swelling, SOB or lightheadedness with hypotension:  no Has patient had a PCN reaction causing severe rash involving mucus membranes or skin necrosis: no Has patient had a PCN reaction that required hospitalization no Has patient had a PCN reaction occurring within the last 10 years: no - childhood reaction If all of the above answers are "NO", then may proceed with Cephalosporin use.     Medication list has been reviewed and updated.  Current Outpatient Medications on File Prior to Visit  Medication Sig Dispense Refill  . aspirin EC 81 MG tablet Take 81 mg by mouth daily.    [name] Kitchen CALCIUM-MAGNESIUM-VITAMIN D PO Take 2 tablets by mouth 2 (two) times daily.     . cephALEXin (KEFLEX) 500 MG capsule     . clonazePAM (KLONOPIN) 0.5 MG  tablet Take 0.5 tablets (0.25 mg total) by mouth at bedtime. 90 tablet 0  . FIBER PO Take 3 capsules by mouth 2 (two) times daily.     . Melatonin 10 MG TABS Take 1 tablet by mouth at bedtime.    . Multiple Vitamin (MULTI-VITAMINS) TABS Take 1 tablet by mouth daily.     [name] Kitchen omeprazole (PRILOSEC) 20 MG capsule Take 1 capsule (20 mg total) by mouth daily. 90 capsule 3  . Polyethylene Glycol 3350 (MIRALAX PO) Take 17 g by mouth daily.     . Probiotic Product (PROBIOTIC ADVANCED PO) Take 1 tablet by mouth daily.     [name] Kitchen rOPINIRole (REQUIP) 1 MG tablet Take by mouth.    . simvastatin (ZOCOR) 10 MG tablet Take 1 tablet (10 mg total) by mouth at bedtime. 90 tablet 3  . TRICOR 145 MG tablet TAKE 1 TABLET EVERY EVENING 90 tablet 3  . vitamin C (ASCORBIC ACID) 500 MG tablet Take 500 mg by mouth daily.     No current facility-administered medications on file prior to visit.    Review of Systems:  As per HPI- otherwise negative.   Physical Examination: Vitals:   05/20/20 1049  BP: 128/82  Pulse: 96  Resp: 17  Temp: 97.8 F (36.6 C)  SpO2: 92%   Vitals:   05/20/20 1049  Weight: 228 lb (103.4 kg)  Height: 5' 10.5" (1.791 m)   Body mass index is 32.25 kg/m. Ideal Body Weight: Weight in (lb) to have BMI = 25: 176.4  GEN: no acute distress. Obese, sitting in wheelchair.  Appears his normal self HEENT: Atraumatic, Normocephalic.  Ears and Nose: No external deformity. CV: RRR, No M/G/R. No JVD. No thrill. No extra heart sounds. PULM: CTA B, no wheezes, crackles, rhonchi. No retractions. No resp. distress. No accessory muscle use. EXTR: No c/c.  Both feet  are plethoric in color, with slow capillary refill.  Appear to have significant venous stasis.  There is some edema, trace to 1+ bilaterally.  I am able to palpate both pedal pulses PSYCH: Normally interactive. Conversant.  Right shoulder demonstrates reduced range of motion in flexion and abduction.  Patient has never can dysfunction and pain  with internal rotation, less so with external rotation Bilateral ABI testing in September 2019 normal Assessment and Plan: Controlled type 2 diabetes mellitus without complication, without long-term current use of insulin ([place]) - Plan: Basic metabolic panel, Hemoglobin A1c  Polyneuropathy associated with underlying disease ([place]) - Plan: CBC, Basic metabolic panel  Hyperlipidemia LDL goal <100 - Plan: Lipid panel  Acute pain of right shoulder - Plan: Ambulatory referral to Orthopedic Surgery  Nasal discharge  Frail elderly  Lower extremity edema  Patient with complex medical history here today for follow-up visit We will check A1c, diabetes is currently diet controlled Lipid panel, currently using Zocor and TriCor Shoulder pain, more problematic with leg weakness.  Referral to orthopedics for further evaluation Nasal discharge-not good candidate for anticholinergic.  Suggested antihistamine Lower extremity edema-suspect due to venous insufficiency and patient with poor mobility.  Both the patient and his wife are not really able to apply firm compression socks.  I suggested that they try a lighter compression which might be easier to apply, or they could experiment with Ace wraps, not too tight.  I also recommended leg elevation when possible. Will plan further follow- up pending labs.  This visit occurred during the SARS-CoV-2 public health emergency.  Safety protocols were in place, including screening questions prior to the visit, additional usage of staff PPE, and extensive cleaning of exam room while observing appropriate contact time as indicated for disinfecting solutions.    Signed Lamar Blinks, MD  Received his labs as below, message to patient Hemoglobin improved from most recent  Results for orders placed or performed in visit on 05/20/20  CBC  Result Value Ref Range   WBC 5.7 4.0 - 10.5 K/uL   RBC 4.61 4.22 - 5.81 Mil/uL   Platelets 189.0 150.0 - 400.0 K/uL    Hemoglobin 12.7 (L) 13.0 - 17.0 g/dL   HCT 39.3 39.0 - 52.0 %   MCV 85.2 78.0 - 100.0 fl   MCHC 32.3 30.0 - 36.0 g/dL   RDW 14.6 11.5 - 08.1 %  Basic metabolic panel  Result Value Ref Range   Sodium 140 135 - 145 mEq/L   Potassium 4.5 3.5 - 5.1 mEq/L   Chloride 102 96 - 112 mEq/L   CO2 33 (H) 19 - 32 mEq/L   Glucose, Bld 105 (H) 70 - 99 mg/dL   BUN 22 6 - 23 mg/dL   Creatinine, Ser 1.06 0.40 - 1.50 mg/dL   GFR 66.87 >60.00 mL/min   Calcium 10.4 8.4 - 10.5 mg/dL  Hemoglobin A1c  Result Value Ref Range   Hgb A1c MFr Bld 6.9 (H) 4.6 - 6.5 %  Lipid panel  Result Value Ref Range   Cholesterol 110 0 - 200 mg/dL   Triglycerides 89.0 0.0 - 149.0 mg/dL   HDL 51.80 >39.00 mg/dL   VLDL 17.8 0.0 - 40.0 mg/dL   LDL Cholesterol 40 0 - 99 mg/dL   Total CHOL/HDL Ratio 2    NonHDL 57.94

## 2020-05-18 NOTE — Patient Instructions (Addendum)
It was great to see you again today, I will be in touch with your labs as soon as possible Please consider having the Shingrix vaccine at your pharmacy if not done already  Try compression socks or possibly ace wraps for the swelling in your legs I will have you see Dr Veverly Fells for your shoulder pain

## 2020-05-20 ENCOUNTER — Ambulatory Visit (INDEPENDENT_AMBULATORY_CARE_PROVIDER_SITE_OTHER): Payer: Medicare Other | Admitting: Family Medicine

## 2020-05-20 ENCOUNTER — Other Ambulatory Visit: Payer: Self-pay

## 2020-05-20 ENCOUNTER — Encounter: Payer: Self-pay | Admitting: Family Medicine

## 2020-05-20 ENCOUNTER — Ambulatory Visit: Payer: Medicare Other | Admitting: Internal Medicine

## 2020-05-20 VITALS — BP 128/82 | HR 96 | Temp 97.8°F | Resp 17 | Ht 70.5 in | Wt 228.0 lb

## 2020-05-20 DIAGNOSIS — G63 Polyneuropathy in diseases classified elsewhere: Secondary | ICD-10-CM

## 2020-05-20 DIAGNOSIS — E785 Hyperlipidemia, unspecified: Secondary | ICD-10-CM

## 2020-05-20 DIAGNOSIS — M25511 Pain in right shoulder: Secondary | ICD-10-CM

## 2020-05-20 DIAGNOSIS — R600 Localized edema: Secondary | ICD-10-CM

## 2020-05-20 DIAGNOSIS — R54 Age-related physical debility: Secondary | ICD-10-CM | POA: Diagnosis not present

## 2020-05-20 DIAGNOSIS — J3489 Other specified disorders of nose and nasal sinuses: Secondary | ICD-10-CM | POA: Diagnosis not present

## 2020-05-20 DIAGNOSIS — E119 Type 2 diabetes mellitus without complications: Secondary | ICD-10-CM | POA: Diagnosis not present

## 2020-05-20 LAB — BASIC METABOLIC PANEL
BUN: 22 mg/dL (ref 6–23)
CO2: 33 mEq/L — ABNORMAL HIGH (ref 19–32)
Calcium: 10.4 mg/dL (ref 8.4–10.5)
Chloride: 102 mEq/L (ref 96–112)
Creatinine, Ser: 1.06 mg/dL (ref 0.40–1.50)
GFR: 66.87 mL/min (ref >60.00)
Glucose, Bld: 105 mg/dL — ABNORMAL HIGH (ref 70–99)
Potassium: 4.5 mEq/L (ref 3.5–5.1)
Sodium: 140 mEq/L (ref 135–145)

## 2020-05-20 LAB — CBC
HCT: 39.3 % (ref 39.0–52.0)
Hemoglobin: 12.7 g/dL — ABNORMAL LOW (ref 13.0–17.0)
MCHC: 32.3 g/dL (ref 30.0–36.0)
MCV: 85.2 fl (ref 78.0–100.0)
Platelets: 189 10*3/uL (ref 150.0–400.0)
RBC: 4.61 Mil/uL (ref 4.22–5.81)
RDW: 14.6 % (ref 11.5–15.5)
WBC: 5.7 10*3/uL (ref 4.0–10.5)

## 2020-05-20 LAB — HEMOGLOBIN A1C: Hgb A1c MFr Bld: 6.9 % — ABNORMAL HIGH (ref 4.6–6.5)

## 2020-05-20 LAB — LIPID PANEL
Cholesterol: 110 mg/dL (ref 0–200)
HDL: 51.8 mg/dL (ref >39.00)
LDL Cholesterol: 40 mg/dL (ref 0–99)
NonHDL: 57.94
Total CHOL/HDL Ratio: 2
Triglycerides: 89 mg/dL (ref 0.0–149.0)
VLDL: 17.8 mg/dL (ref 0.0–40.0)

## 2020-05-21 DIAGNOSIS — D1801 Hemangioma of skin and subcutaneous tissue: Secondary | ICD-10-CM | POA: Diagnosis not present

## 2020-05-21 DIAGNOSIS — L821 Other seborrheic keratosis: Secondary | ICD-10-CM | POA: Diagnosis not present

## 2020-05-21 DIAGNOSIS — Z85828 Personal history of other malignant neoplasm of skin: Secondary | ICD-10-CM | POA: Diagnosis not present

## 2020-05-27 DIAGNOSIS — N319 Neuromuscular dysfunction of bladder, unspecified: Secondary | ICD-10-CM | POA: Diagnosis not present

## 2020-06-13 ENCOUNTER — Encounter: Payer: Self-pay | Admitting: Family Medicine

## 2020-06-15 NOTE — Telephone Encounter (Signed)
Called and spoke with them- encouraged them to do to the ER .  Nesta declines for now.  I am next in the office on Wednesday- will schedule to see me Wednesday afternoon per their schedule. For some reason I am not able to book myself right now, will ask Mel Almond to book him in the am.  Asked them to call pulmonology in the am and discuss oxygen concerns.  Sounds like he needs home oxygen

## 2020-06-16 ENCOUNTER — Telehealth: Payer: Self-pay | Admitting: Pulmonary Disease

## 2020-06-16 ENCOUNTER — Encounter: Payer: Self-pay | Admitting: Pulmonary Disease

## 2020-06-16 ENCOUNTER — Other Ambulatory Visit: Payer: Self-pay

## 2020-06-16 ENCOUNTER — Ambulatory Visit (INDEPENDENT_AMBULATORY_CARE_PROVIDER_SITE_OTHER): Payer: Medicare Other | Admitting: Pulmonary Disease

## 2020-06-16 ENCOUNTER — Ambulatory Visit (INDEPENDENT_AMBULATORY_CARE_PROVIDER_SITE_OTHER): Payer: Medicare Other

## 2020-06-16 VITALS — BP 118/68 | HR 62 | Temp 98.2°F | Ht 70.5 in | Wt 222.4 lb

## 2020-06-16 DIAGNOSIS — Z9989 Dependence on other enabling machines and devices: Secondary | ICD-10-CM | POA: Diagnosis not present

## 2020-06-16 DIAGNOSIS — G4733 Obstructive sleep apnea (adult) (pediatric): Secondary | ICD-10-CM

## 2020-06-16 DIAGNOSIS — R21 Rash and other nonspecific skin eruption: Secondary | ICD-10-CM | POA: Diagnosis not present

## 2020-06-16 DIAGNOSIS — R0602 Shortness of breath: Secondary | ICD-10-CM | POA: Diagnosis not present

## 2020-06-16 DIAGNOSIS — J9601 Acute respiratory failure with hypoxia: Secondary | ICD-10-CM | POA: Diagnosis not present

## 2020-06-16 LAB — D-DIMER, QUANTITATIVE: D-Dimer, Quant: 0.96 mcg/mL FEU — ABNORMAL HIGH (ref <0.50)

## 2020-06-16 MED ORDER — FUROSEMIDE 20 MG PO TABS
20.0000 mg | ORAL_TABLET | Freq: Every day | ORAL | 0 refills | Status: AC
Start: 2020-06-16 — End: ?

## 2020-06-16 NOTE — Progress Notes (Signed)
Subjective:    Patient ID: Matthew Nash, male    DOB: July 23, 1936, 84 y.o.   MRN: [id number]  HPI  84 year old whom we follow for OSA, now referred for evaluation of new onset hypoxia Wife called reporting saturations 80 to 85%, increased weakness in his legs, now requiring 24/7 care, onset of symptoms 1 week   PMH - spinal stenosis due to degenerative disc disease, he also has a neurogenic bladder for which he self catheterizes , no diagnosed as pre-Parkinson's, vocal cord dysfunction   He has been declining over the past year, frequent falls.  He was hospitalized 10/2019 for Pseudomonas UTI.  He remains on Keflex prophylaxis for UTIs Wife reports increasing weakness, last 3 days saturations were as low as 85 to 87%. Increasing bipedal edema over the past few weeks but "no one seems to be concerned"  She reports nose is running "like a faucet" He remains on Requip and low-dose clonazepam continues to have episodes where he yells in his sleep  Uses a full facemask, no problems with mask or pressure. CPAP download was reviewed which shows residual AHI of 16 on 12 cm with large leak and good compliance, weight is dropped from 235 to 222 pounds      Significant tests/ events reviewed  12/2016 NPSG - AHI 17/hour with lowest desaturation of 81%, this was worse during REM and supine sleep, 29/hour during REM and 40/hour during supine sleep.  PLM index was 50/hour but PLM related arousals were only 7/hour.  Of note there was no increased muscle tone noted during REM sleep.  titration study CPAP  15 cm   Echo 05/2019 showed normal LVEF   Past Medical History:  Diagnosis Date  . Arthritis    both knees  . Back pain at L4-L5 level   . Basal cell carcinoma   . BPH (benign prostatic hyperplasia)    hx s/p turp  . Chronic back pain    spondylosis  . Chronic kidney disease    kidney stones 1979  . DDD (degenerative disc disease), lumbar   . Diabetes mellitus    takes Metformin and  Victoza daily-under control  . Diarrhea    x 1 today  . Diverticulitis   . Gallstones   . GERD (gastroesophageal reflux disease)    takes Omeprazole daily-under control  . H/O hiatal hernia   . H/O measles   . Hepatitis B    B-with Mono and jaundice 1960  . History of chicken pox   . History of colon polyps   . History of kidney stones   . Hyperlipidemia    takes Simvastatin and Tricor daily-under control  . Hypertension    takes Lisinopril daily-under control  . Joint pain    both knees  . Joint swelling    rt knee  . Kidney stones   . Melanoma ([place])    right hand;basal cell carcinoma  . Neuropathy   . Peripheral vascular disease ([place])    diabetic neuropathy  . Pneumonia    hx of  . Prostate infection    currently taking macrobid  . Sleep apnea with use of continuous positive airway pressure (CPAP)   . Tinnitus   . Urinary incontinence   . UTI (lower urinary tract infection)     Past Surgical History:  Procedure Laterality Date  . BASAL CELL CARCINOMA EXCISION    . BOTOX INJECTION  03/22/2017   In the bladder for nightly incontinence.  . C5 and  T1 bone chip removed   1986  . [place]  . CHOLECYSTECTOMY  1993  . COLONOSCOPY  2011  . cortisone injection    . EYE SURGERY     cataract sx 2017 bilateral  . I&D left knee Left 1958   states 22 times  . KNEE ARTHROSCOPY Right 07/16/2014   Procedure: RIGHT ARTHROSCOPY KNEE WITH Medial and Lateral DEBRIDEMENT and chondroplasty;  Surgeon: Gearlean Alf, MD;  [facility];  Service: Orthopedics;  Laterality: Right;  . LUMBAR LAMINECTOMY/DECOMPRESSION MICRODISCECTOMY Right 02/17/2014   Procedure: RIGHT LUMBAR TWO-THREE LAMINECTOMY;  Surgeon: Charlie Pitter, MD;  Location: [facility];  Service: Neurosurgery;  Laterality: Right;  right   . right shoulder surgery  2012   rotator cuff repair  . SHOULDER ARTHROSCOPY W/ ROTATOR CUFF REPAIR Left NOV 2010  . TONSILLECTOMY  1942  . TOTAL KNEE ARTHROPLASTY  Right 05/27/2015   Procedure: RIGHT TOTAL KNEE ARTHROPLASTY;  Surgeon: Gaynelle Arabian, MD;  [facility];  Service: Orthopedics;  Laterality: Right;  . TOTAL KNEE ARTHROPLASTY Left 05/30/2016   Procedure: LEFT TOTAL KNEE ARTHROPLASTY;  Surgeon: Gaynelle Arabian, MD;  [facility];  Service: Orthopedics;  Laterality: Left;  . TRANSURETHRAL RESECTION OF PROSTATE  2006  . VASECTOMY  1983  . WISDOM TOOTH EXTRACTION      Allergies  Allergen Reactions  . Novobiocin Itching and Rash  . Sulfamethoxazole-Trimethoprim Diarrhea    Severe diarrhea  . Penicillins Rash and Other (See Comments)    Has patient had a PCN reaction causing immediate rash, facial/tongue/throat swelling, SOB or lightheadedness with hypotension:  no Has patient had a PCN reaction causing severe rash involving mucus membranes or skin necrosis: no Has patient had a PCN reaction that required hospitalization no Has patient had a PCN reaction occurring within the last 10 years: no - childhood reaction If all of the above answers are "NO", then may proceed with Cephalosporin use.     Social History   Socioeconomic History  . Marital status: Married    Spouse name: Not on file  . Number of children: 4  . Years of education: Not on file  . Highest education level: Not on file  Occupational History  . Occupation: retired  Tobacco Use  . Smoking status: Former Smoker    Types: Pipe    Quit date: 07/14/1978    Years since quitting: 41.9  . Smokeless tobacco: Never Used  . Tobacco comment: quit smoking 1979  Substance and Sexual Activity  . Alcohol use: Yes    Comment: OCCASIONAL  . Drug use: No  . Sexual activity: Never  Other Topics Concern  . Not on file  Social History Narrative  . Not on file   Social Determinants of Health   Financial Resource Strain: Low Risk   . Difficulty of Paying Living Expenses: Not hard at all  Food Insecurity: No Food Insecurity  . Worried About Charity fundraiser in the Last  Year: Never true  . Ran Out of Food in the Last Year: Never true  Transportation Needs: No Transportation Needs  . Lack of Transportation (Medical): No  . Lack of Transportation (Non-Medical): No  Physical Activity:   . Days of Exercise per Week:   . Minutes of Exercise per Session:   Stress:   . Feeling of Stress :   Social Connections:   . Frequency of Communication with Friends and Family:   . Frequency of Social Gatherings  with Friends and Family:   . Attends Religious Services:   . Active Member of Clubs or Organizations:   . Attends Archivist Meetings:   [name] Kitchen Marital Status:   Intimate Partner Violence:   . Fear of Current or Ex-Partner:   . Emotionally Abused:   [name] Kitchen Physically Abused:   . Sexually Abused:      Review of Systems neg for any significant sore throat, dysphagia, itching, sneezing, nasal congestion or excess/ purulent secretions, fever, chills, sweats, unintended wt loss, pleuritic or exertional cp, hempoptysis, orthopnea pndAlso denies presyncope, palpitations, heartburn, abdominal pain, nausea, vomiting, diarrhea or change in bowel or urinary habits, dysuria,hematuria, rash, arthralgias, visual complaints, headache, numbness weakness or ataxia.     Objective:   Physical Exam   Gen. Pleasant, obese, in no distress ENT - no lesions, no post nasal drip Neck: No JVD, no thyromegaly, no carotid bruits Lungs: no use of accessory muscles, no dullness to percussion, decreased without rales or rhonchi  Cardiovascular: Rhythm regular, heart sounds  normal, no murmurs or gallops, 2+ peripheral edema Musculoskeletal: No deformities, no cyanosis or clubbing , no tremors        Assessment & Plan:

## 2020-06-16 NOTE — Patient Instructions (Signed)
  Blood work today -D-dimer,BMET, CBC Prescription for Lasix 20 mg daily for 7 days  You did not qualify for oxygen today, but if oxygen level stays lower than 90 %, please call and we can reassess  We will ask DME to change to auto CPAP settings 10 to 18 cm and recheck download in 1 month

## 2020-06-16 NOTE — Assessment & Plan Note (Signed)
Not clear what is causing his hypoxia today, he does have bipedal edema but no evidence of left heart failure.  No obvious aspiration by history or finding of pneumonia on exam or chest x-ray. No evidence of sepsis. Unfortunately he does not meet criteria to start him on oxygen, saturation stayed at 91% even on limited ambulation.  We will try to diurese him with 20 mg of Lasix VTE is in the differential and we will obtain a D-dimer , if positive then will proceed with venous duplex and CT angiogram chest I have asked his wife to call us back if saturations stay less than 90%

## 2020-06-16 NOTE — Telephone Encounter (Signed)
Seen in office today  

## 2020-06-16 NOTE — Telephone Encounter (Signed)
Spoke with patient's wife, [name], listed on DPR, patient sats on RA in the 80's, 80-85%.  States last pm sats were 87% and this morning was 90%.  Using CPAP, yesterday first thing sat wad 87%.  He is weak in the legs and is requiring 24/7 care from wife.  States this all started last week.  She has been in contact with his pcp and they recommended seeing his pulmonary md for oxygen.  Dr. Elsworth Soho had an opening this afternoon, patient scheduled, will need a walk.  He uses a walker.  Dr. Elsworth Soho, just making you aware this patient is on your afternoon schedule for 2 pm.

## 2020-06-16 NOTE — Progress Notes (Addendum)
[facility] [address], [place], [place] [address] [phone]  Date:  06/17/2020   Name:  Matthew Nash   DOB:  1936-02-24   MRN:  [id number]  PCP:  Darreld Mclean, MD    Chief Complaint: Fatigue (Pt here to discuss fatigue, oxygen levels, and loss of appetite.)   History of Present Illness:  Matthew Nash is a 84 y.o. very pleasant male patient who presents with the following:  Patient with complex past medical history, brought in today semiurgently for increase in symptoms Patient with history of diabetes, hypertension, sleep apnea, spinal stenosis and peripheral neuropathy, chronic kidney disease, pelvic floor dysfunction and incontinence of both urine and feces  The patient and his wife contacted me via [place] on July 3 with concern of oxygen desats as low as 80, increased weakness and decreased appetite I encouraged him to go to the ER but they declined at that time  He was seen by his pulmonologist, Dr. Elsworth Soho yesterday He did not qualify for home oxygen, lowest oxygen saturation 91% with ambulation-patient and his wife report sats into the mid to low 80s still at home.  They did start Lasix for diuresis x7 days, and planned a D-dimer with plan for lower extremity ultrasound and CT angiogram if positive His D-dimer was positive, discussed with patient and his wife.  We will plan for a CT angiogram and bilateral lower extremity Doppler today  They were not able to pick up the lasix yet as it was sent to the wrong pharmacy - they plan to start this tomorrow His wife notes that somewhat rapidly he is not doing as well as he was 1 to 2 years ago- is not as strong, is not as hungry. He does a self cath at night He is feeing more fatigued- his arms get more tired using his walker to get around No CP   Patient Active Problem List   Diagnosis Date Noted  . HTN (hypertension) 06/07/2019  . AKI (acute kidney injury)  ([place]) 06/07/2019  . Elevated troponin 06/07/2019  . Acute respiratory failure with hypoxia ([place]) 06/06/2019  . Chronic kidney disease 11/25/2018  . Ischemic chest pain ([place]) 11/25/2018  . Chest pain 11/25/2018  . Pelvic floor dysfunction 04/04/2018  . OSA on CPAP 01/12/2018  . REM behavioral disorder 01/12/2018  . Diarrhea 09/15/2017  . Incontinence of feces 09/15/2017  . Peripheral neuropathy 10/20/2016  . Bladder outlet obstruction 05/02/2016  . OA (osteoarthritis) of knee 05/27/2015  . Diabetes mellitus type II, controlled ([place]) 11/26/2014  . Gastroesophageal reflux disease without esophagitis 11/26/2014  . Arthritis of both knees 11/26/2014  . Absence of bladder continence 11/26/2014  . Hyperlipidemia LDL goal <100 11/26/2014  . Acute medial meniscal tear 07/16/2014  . Lumbosacral spondylosis without myelopathy 02/17/2014  . Spondylosis, lumbosacral 02/17/2014    Past Medical History:  Diagnosis Date  . Arthritis    both knees  . Back pain at L4-L5 level   . Basal cell carcinoma   . BPH (benign prostatic hyperplasia)    hx s/p turp  . Chronic back pain    spondylosis  . Chronic kidney disease    kidney stones 1979  . DDD (degenerative disc disease), lumbar   . Diabetes mellitus    takes Metformin and Victoza daily-under control  . Diarrhea    x 1 today  . Diverticulitis   . Gallstones   . GERD (  gastroesophageal reflux disease)    takes Omeprazole daily-under control  . H/O hiatal hernia   . H/O measles   . Hepatitis B    B-with Mono and jaundice 1960  . History of chicken pox   . History of colon polyps   . History of kidney stones   . Hyperlipidemia    takes Simvastatin and Tricor daily-under control  . Hypertension    takes Lisinopril daily-under control  . Joint pain    both knees  . Joint swelling    rt knee  . Kidney stones   . Melanoma ([place])    right hand;basal cell carcinoma  . Neuropathy   . Peripheral vascular disease ([place])    diabetic  neuropathy  . Pneumonia    hx of  . Prostate infection    currently taking macrobid  . Sleep apnea with use of continuous positive airway pressure (CPAP)   . Tinnitus   . Urinary incontinence   . UTI (lower urinary tract infection)     Past Surgical History:  Procedure Laterality Date  . BASAL CELL CARCINOMA EXCISION    . BOTOX INJECTION  03/22/2017   In the bladder for nightly incontinence.  . C5 and T1 bone chip removed   1986  . [place]  . CHOLECYSTECTOMY  1993  . COLONOSCOPY  2011  . cortisone injection    . EYE SURGERY     cataract sx 2017 bilateral  . I&D left knee Left 1958   states 22 times  . KNEE ARTHROSCOPY Right 07/16/2014   Procedure: RIGHT ARTHROSCOPY KNEE WITH Medial and Lateral DEBRIDEMENT and chondroplasty;  Surgeon: Gearlean Alf, MD;  Location: [facility];  Service: Orthopedics;  Laterality: Right;  . LUMBAR LAMINECTOMY/DECOMPRESSION MICRODISCECTOMY Right 02/17/2014   Procedure: RIGHT LUMBAR TWO-THREE LAMINECTOMY;  Surgeon: Charlie Pitter, MD;  Location: [facility];  Service: Neurosurgery;  Laterality: Right;  right   . right shoulder surgery  2012   rotator cuff repair  . SHOULDER ARTHROSCOPY W/ ROTATOR CUFF REPAIR Left NOV 2010  . TONSILLECTOMY  1942  . TOTAL KNEE ARTHROPLASTY Right 05/27/2015   Procedure: RIGHT TOTAL KNEE ARTHROPLASTY;  Surgeon: Gaynelle Arabian, MD;  Location: [facility];  Service: Orthopedics;  Laterality: Right;  . TOTAL KNEE ARTHROPLASTY Left 05/30/2016   Procedure: LEFT TOTAL KNEE ARTHROPLASTY;  Surgeon: Gaynelle Arabian, MD;  Location: [facility];  Service: Orthopedics;  Laterality: Left;  . TRANSURETHRAL RESECTION OF PROSTATE  2006  . VASECTOMY  1983  . WISDOM TOOTH EXTRACTION      Social History   Tobacco Use  . Smoking status: Former Smoker    Types: Pipe    Quit date: 07/14/1978    Years since quitting: 41.9  . Smokeless tobacco: Never Used  . Tobacco comment: quit smoking 1979  Substance Use Topics  . Alcohol use: Yes     Comment: OCCASIONAL  . Drug use: No    Family History  Problem Relation Age of Onset  . Heart disease Mother 42       Deceased  . Bladder Cancer Mother   . Breast cancer Mother   . Prostate cancer Father        Deceased-in 78s  . Healthy Brother   . Skin cancer Brother        "basal cell cancer in the bloodstream"  . Asthma Sister        Deceased  . Healthy Son        #  1  . Healthy Daughter        #2  . Obesity Son        #2    Allergies  Allergen Reactions  . Novobiocin Itching and Rash  . Sulfamethoxazole-Trimethoprim Diarrhea    Severe diarrhea  . Penicillins Rash and Other (See Comments)    Has patient had a PCN reaction causing immediate rash, facial/tongue/throat swelling, SOB or lightheadedness with hypotension:  no Has patient had a PCN reaction causing severe rash involving mucus membranes or skin necrosis: no Has patient had a PCN reaction that required hospitalization no Has patient had a PCN reaction occurring within the last 10 years: no - childhood reaction If all of the above answers are "NO", then may proceed with Cephalosporin use.     Medication list has been reviewed and updated.  Current Outpatient Medications on File Prior to Visit  Medication Sig Dispense Refill  . aspirin EC 81 MG tablet Take 81 mg by mouth daily.    [name] Kitchen CALCIUM-MAGNESIUM-VITAMIN D PO Take 2 tablets by mouth 2 (two) times daily.     . cephALEXin (KEFLEX) 500 MG capsule     . clonazePAM (KLONOPIN) 0.5 MG tablet Take 0.5 tablets (0.25 mg total) by mouth at bedtime. 90 tablet 0  . FIBER PO Take 3 capsules by mouth 2 (two) times daily.     . furosemide (LASIX) 20 MG tablet Take 1 tablet (20 mg total) by mouth daily. 7 tablet 0  . Melatonin 10 MG TABS Take 1 tablet by mouth at bedtime.    . Multiple Vitamin (MULTI-VITAMINS) TABS Take 1 tablet by mouth daily.     [name] Kitchen omeprazole (PRILOSEC) 20 MG capsule Take 1 capsule (20 mg total) by mouth daily. 90 capsule 3  . Polyethylene Glycol  3350 (MIRALAX PO) Take 17 g by mouth daily.     . Probiotic Product (PROBIOTIC ADVANCED PO) Take 1 tablet by mouth daily.     [name] Kitchen rOPINIRole (REQUIP) 1 MG tablet Take by mouth.    . simvastatin (ZOCOR) 10 MG tablet Take 1 tablet (10 mg total) by mouth at bedtime. 90 tablet 3  . TRICOR 145 MG tablet TAKE 1 TABLET EVERY EVENING 90 tablet 3  . vitamin C (ASCORBIC ACID) 500 MG tablet Take 500 mg by mouth daily.     No current facility-administered medications on file prior to visit.    Review of Systems:  As per HPI- otherwise negative.   Physical Examination: Vitals:   06/17/20 1353  BP: 102/60  Pulse: 81  Resp: 18  Temp: 97.6 F (36.4 C)  SpO2: 90%   Vitals:   06/17/20 1353  Height: 5' 10.5" (1.791 m)   Body mass index is 31.46 kg/m. Ideal Body Weight: Weight in (lb) to have BMI = 25: 176.4  GEN: no acute distress.  Overweight, appears his normal self but not as robust as is typical for him He is using a wheelchair today HEENT: Atraumatic, Normocephalic.  Ears and Nose: No external deformity. CV: RRR, No M/G/R. No JVD. No thrill. No extra heart sounds. PULM: CTA B, no wheezes, crackles, rhonchi. No retractions. No resp. distress. No accessory muscle use. ABD: S, NT, ND, +BS. No rebound. No HSM. EXTR: No c/c/1+ pitting edema both lateral extremities PSYCH: Normally interactive. Conversant.  Foot exam-see notes  Assessment and Plan: Functional urinary incontinence - Plan: Urine Culture  Shortness of breath - Plan: CT Angio Chest W/Cm &/Or Wo Cm  Lower extremity edema  Positive D dimer - Plan: US Venous Img Lower Bilateral (DVT)  Single subsegmental pulmonary embolism without acute cor pulmonale (HCC) - Plan: apixaban (ELIQUIS) 5 MG TABS tablet, apixaban (ELIQUIS) 5 MG TABS tablet  Patient year today for a follow-up visit.  He has a complex medical history and has been declining over the last year or 2.  However, his wife feels that he has become worse more acutely  over the last few weeks He does have urinary incontinence and has to self catheterize, would be high risk for UTI.  Obtain urine for culture today Lower extremity edema, likely due to venous stasis.  They have been prescribed Lasix but not pick it up just yet.  BNP not done, recent chest film did not show any pulmonary edema He did have a positive D-dimer recently, will check lower extremity ultrasound as well as CT angiogram today  This visit occurred during the SARS-CoV-2 public health emergency.  Safety protocols were in place, including screening questions prior to the visit, additional usage of staff PPE, and extensive cleaning of exam room while observing appropriate contact time as indicated for disinfecting solutions.    Signed Lamar Blinks, MD  Received imaging results as below- Discussed with his pulmonologist Dr. Elsworth Soho, we plan to treat him with Eliquis Called patient and discussed finding of small PE on CT scan, will treat with Eliquis.  He is in agreement, has used Eliquis before for prophylaxis around time of operations Sent the for 7 days of Eliquis to local pharmacy, continuation dose sent to mail away  Epic system pulled up a warning about Eliquis dosage, I called and discussed with Pharm.D. to ensure dosage is correct  Meds ordered this encounter  Medications  . apixaban (ELIQUIS) 5 MG TABS tablet    Sig: Take 1 tablet (5 mg total) by mouth 2 (two) times daily.    Dispense:  180 tablet    Refill:  0  . apixaban (ELIQUIS) 5 MG TABS tablet    Sig: Take 2 tablets (10 mg total) by mouth 2 (two) times daily. Take for one week, then change to 5 mg BID    Dispense:  28 tablet    Refill:  0     DG Chest 2 View  Result Date: 06/16/2020 CLINICAL DATA:  84 year old male with shortness of breath. EXAM: CHEST - 2 VIEW COMPARISON:  Chest radiograph dated 11/07/2019. FINDINGS: There are bibasilar linear atelectasis/scarring. No consolidative changes. There is no pleural effusion  pneumothorax. The cardiac silhouette is within limits. No acute osseous pathology. IMPRESSION: No active cardiopulmonary disease. Electronically Signed   By: Anner Crete M.D.   On: 06/16/2020 23:20   CT Angio Chest W/Cm &/Or Wo Cm  Result Date: 06/17/2020 CLINICAL DATA:  84 year old male with shortness of breath. EXAM: CT ANGIOGRAPHY CHEST WITH CONTRAST TECHNIQUE: Multidetector CT imaging of the chest was performed using the standard protocol during bolus administration of intravenous contrast. Multiplanar CT image reconstructions and MIPs were obtained to evaluate the vascular anatomy. CONTRAST:  129mL OMNIPAQUE IOHEXOL 350 MG/ML SOLN COMPARISON:  Chest CT dated 11/02/2019. FINDINGS: Cardiovascular: Mild cardiomegaly. No pericardial effusion. The thoracic aorta is unremarkable. There is a small right lower lobe subsegmental pulmonary artery embolus which is age indeterminate. No large or central pulmonary artery embolus identified. No CT evidence of right heart straining. Mediastinum/Nodes: There is no hilar or mediastinal adenopathy. The esophagus and the thyroid gland are grossly unremarkable. No mediastinal fluid collection. Lungs/Pleura: Minimal bibasilar atelectasis/scarring. No focal  consolidation, pleural effusion, or pneumothorax. The central airways are patent. Upper Abdomen: Probable mild fatty liver. Cholecystectomy. Indeterminate small bilateral adrenal nodules. Musculoskeletal: Osteopenia with degenerative changes of the spine. No acute osseous pathology. Review of the MIP images confirms the above findings. IMPRESSION: 1. Small age indeterminate focus of pulmonary embolism in the right lower lobe pulmonary artery branch. No large or central pulmonary artery embolus identified. No CT evidence of right heart straining. 2. Mild cardiomegaly. These results were called by telephone at the time of interpretation on 06/17/2020 at 3:52 pm to [place], who verbally acknowledged these  results. Electronically Signed   By: Anner Crete M.D.   On: 06/17/2020 15:57   US Venous Img Lower Bilateral (DVT)  Result Date: 06/17/2020 CLINICAL DATA:  Lower extremity edema and positive D-dimer EXAM: BILATERAL LOWER EXTREMITY VENOUS DUPLEX ULTRASOUND TECHNIQUE: Gray-scale sonography with graded compression, as well as color Doppler and duplex ultrasound were performed to evaluate the lower extremity deep venous systems from the level of the common femoral vein and including the common femoral, femoral, profunda femoral, popliteal and calf veins including the posterior tibial, peroneal and gastrocnemius veins when visible. The superficial great saphenous vein was also interrogated. Spectral Doppler was utilized to evaluate flow at rest and with distal augmentation maneuvers in the common femoral, femoral and popliteal veins. COMPARISON:  None. FINDINGS: RIGHT LOWER EXTREMITY Common Femoral Vein: No evidence of thrombus. Normal compressibility, respiratory phasicity and response to augmentation. Saphenofemoral Junction: No evidence of thrombus. Normal compressibility and flow on color Doppler imaging. Profunda Femoral Vein: No evidence of thrombus. Normal compressibility and flow on color Doppler imaging. Femoral Vein: No evidence of thrombus. Normal compressibility, respiratory phasicity and response to augmentation. Popliteal Vein: No evidence of thrombus. Normal compressibility, respiratory phasicity and response to augmentation. Calf Veins: No evidence of thrombus. Normal compressibility and flow on color Doppler imaging. Superficial Great Saphenous Vein: No evidence of thrombus. Normal compressibility. Venous Reflux:  None. Other Findings:  None. LEFT LOWER EXTREMITY Common Femoral Vein: No evidence of thrombus. Normal compressibility, respiratory phasicity and response to augmentation. Saphenofemoral Junction: No evidence of thrombus. Normal compressibility and flow on color Doppler imaging.  Profunda Femoral Vein: No evidence of thrombus. Normal compressibility and flow on color Doppler imaging. Femoral Vein: No evidence of thrombus. Normal compressibility, respiratory phasicity and response to augmentation. Popliteal Vein: No evidence of thrombus. Normal compressibility, respiratory phasicity and response to augmentation. Calf Veins: Limited visualization due to lower extremity edema. Visualized regions unremarkable. Superficial Great Saphenous Vein: No evidence of thrombus. Normal compressibility. Venous Reflux:  None. Other Findings:  None. IMPRESSION: No evidence of deep venous thrombosis in either lower extremity. Note that there is limited visualization of left-sided calf veins. Electronically Signed   By: Lowella Grip III M.D.   On: 06/17/2020 16:04   Received urine culture 7/9- message to pt.  Will rx cipro for him  Results for orders placed or performed in visit on 06/17/20  Urine Culture   Specimen: Urine  Result Value Ref Range   MICRO NUMBER: [id number]    SPECIMEN QUALITY: Adequate    Sample Source NOT GIVEN    STATUS: FINAL    ISOLATE 1: Pseudomonas aeruginosa (A)       Susceptibility   Pseudomonas aeruginosa - URINE CULTURE, REFLEX    CEFTAZIDIME <=1 Sensitive     CEFEPIME <=1 Sensitive     CIPROFLOXACIN 0.5 Sensitive     LEVOFLOXACIN 1 Sensitive     GENTAMICIN <=1  Sensitive     IMIPENEM 2 Sensitive     PIP/TAZO <=4 Sensitive     TOBRAMYCIN* <=1 Sensitive      * Legend:S = Susceptible  I = IntermediateR = Resistant  NS = Not susceptible* = Not tested  NR = Not reported**NN = See antimicrobic comments

## 2020-06-16 NOTE — Assessment & Plan Note (Addendum)
Few events seem to persist on CPAP 12 cm.  He has lost weight from 235 to 222 pounds. We will change him to auto settings 10 to 18 cm and reassess download  Remains on clonazepam for possible REM behavior disorder

## 2020-06-17 ENCOUNTER — Ambulatory Visit (INDEPENDENT_AMBULATORY_CARE_PROVIDER_SITE_OTHER): Payer: Medicare Other | Admitting: Family Medicine

## 2020-06-17 ENCOUNTER — Ambulatory Visit (HOSPITAL_BASED_OUTPATIENT_CLINIC_OR_DEPARTMENT_OTHER)
Admission: RE | Admit: 2020-06-17 | Discharge: 2020-06-17 | Disposition: A | Discharge status: Home / Self Care | Payer: Medicare Other | Source: Ambulatory Visit | Attending: Family Medicine | Admitting: Family Medicine

## 2020-06-17 ENCOUNTER — Encounter: Payer: Self-pay | Admitting: Family Medicine

## 2020-06-17 ENCOUNTER — Telehealth: Payer: Self-pay | Admitting: Pulmonary Disease

## 2020-06-17 VITALS — BP 102/60 | HR 81 | Temp 97.6°F | Resp 18 | Ht 70.5 in

## 2020-06-17 DIAGNOSIS — R0602 Shortness of breath: Secondary | ICD-10-CM | POA: Insufficient documentation

## 2020-06-17 DIAGNOSIS — R600 Localized edema: Secondary | ICD-10-CM

## 2020-06-17 DIAGNOSIS — R7989 Other specified abnormal findings of blood chemistry: Secondary | ICD-10-CM | POA: Diagnosis not present

## 2020-06-17 DIAGNOSIS — I2693 Single subsegmental pulmonary embolism without acute cor pulmonale: Secondary | ICD-10-CM | POA: Diagnosis not present

## 2020-06-17 DIAGNOSIS — R3981 Functional urinary incontinence: Secondary | ICD-10-CM

## 2020-06-17 DIAGNOSIS — N3000 Acute cystitis without hematuria: Secondary | ICD-10-CM | POA: Diagnosis not present

## 2020-06-17 LAB — BASIC METABOLIC PANEL
BUN: 22 mg/dL (ref 6–23)
CO2: 35 mEq/L — ABNORMAL HIGH (ref 19–32)
Calcium: 10.7 mg/dL — ABNORMAL HIGH (ref 8.4–10.5)
Chloride: 99 mEq/L (ref 96–112)
Creatinine, Ser: 1.15 mg/dL (ref 0.40–1.50)
GFR: 60.85 mL/min (ref >60.00)
Glucose, Bld: 99 mg/dL (ref 70–99)
Potassium: 4.6 mEq/L (ref 3.5–5.1)
Sodium: 140 mEq/L (ref 135–145)

## 2020-06-17 LAB — CBC WITH DIFFERENTIAL/PLATELET
Basophils Absolute: 0 10*3/uL (ref 0.0–0.1)
Basophils Relative: 0.6 % (ref 0.0–3.0)
Eosinophils Absolute: 0.1 10*3/uL (ref 0.0–0.7)
Eosinophils Relative: 1.1 % (ref 0.0–5.0)
HCT: 41.3 % (ref 39.0–52.0)
Hemoglobin: 13.2 g/dL (ref 13.0–17.0)
Lymphocytes Relative: 15.4 % (ref 12.0–46.0)
Lymphs Abs: 1 10*3/uL (ref 0.7–4.0)
MCHC: 31.9 g/dL (ref 30.0–36.0)
MCV: 86.6 fl (ref 78.0–100.0)
Monocytes Absolute: 0.5 10*3/uL (ref 0.1–1.0)
Monocytes Relative: 7.4 % (ref 3.0–12.0)
Neutro Abs: 4.8 10*3/uL (ref 1.4–7.7)
Neutrophils Relative %: 75.5 % (ref 43.0–77.0)
Platelets: 144 10*3/uL — ABNORMAL LOW (ref 150.0–400.0)
RBC: 4.77 Mil/uL (ref 4.22–5.81)
RDW: 14.6 % (ref 11.5–15.5)
WBC: 6.4 10*3/uL (ref 4.0–10.5)

## 2020-06-17 MED ORDER — APIXABAN 5 MG PO TABS: 10.0000 mg | ORAL_TABLET | Freq: Two times a day (BID) | ORAL | 0 refills | Status: AC

## 2020-06-17 MED ORDER — APIXABAN 5 MG PO TABS: 5.0000 mg | ORAL_TABLET | Freq: Two times a day (BID) | ORAL | 0 refills | Status: AC

## 2020-06-17 MED ORDER — IOHEXOL 350 MG/ML SOLN
100.0000 mL | Freq: Once | INTRAVENOUS | Status: AC | PRN
  Administered 2020-06-17: 100 mL via INTRAVENOUS

## 2020-06-17 NOTE — Patient Instructions (Addendum)
We will get an ultrasound of your legs and a CT scan of your lungs to ensure no blood clot in your lungs I will be in touch with your urine culture and results asap

## 2020-06-17 NOTE — Telephone Encounter (Signed)
Routing to Dr. Alva. 

## 2020-06-17 NOTE — Telephone Encounter (Signed)
Spoke to Dr. Lorelei Pont. CT reviewed shows small PE, venous duplex negative reviewed Plan is to start Eliquis

## 2020-06-19 ENCOUNTER — Encounter: Payer: Self-pay | Admitting: Family Medicine

## 2020-06-19 ENCOUNTER — Other Ambulatory Visit: Payer: Self-pay | Admitting: Family Medicine

## 2020-06-19 LAB — URINE CULTURE
MICRO NUMBER:: 10675540
SPECIMEN QUALITY:: ADEQUATE

## 2020-06-19 MED ORDER — CIPROFLOXACIN HCL 500 MG PO TABS: 500.0000 mg | ORAL_TABLET | Freq: Two times a day (BID) | ORAL | 0 refills | Status: AC

## 2020-06-19 NOTE — Addendum Note (Signed)
Addended by: Lamar Blinks C on: 06/19/2020 02:20 PM   Modules accepted: Orders

## 2020-06-23 ENCOUNTER — Encounter: Payer: Self-pay | Admitting: Family Medicine

## 2020-06-23 DIAGNOSIS — J9601 Acute respiratory failure with hypoxia: Secondary | ICD-10-CM

## 2020-06-24 NOTE — Telephone Encounter (Signed)
Please ask DME to assess him ASAP for new oxygen start based on these readings Oxygen 2 L/min at all times

## 2020-06-24 NOTE — Addendum Note (Signed)
Addended by: Tery Sanfilippo R on: 06/24/2020 01:43 PM   Modules accepted: Orders

## 2020-06-24 NOTE — Telephone Encounter (Signed)
My chart message from patient, please advise.  The Furosemide did not help with the swelling and is urine outtake seemed to decrease rather than increase. Matthew Nash's oxygen level is always in the 80s when he wakes up and he is so lightheaded he can barely get out of bed.  His oxygen levels  after waking up: July 7th 86, July 8th 89, July 9th 88, July 12th 85 and July 13th 85. We took the oxygen meter to Dr. Lillie Fragmin appointment on July 7th and it came up with the same reading while we were there. I am still concerned with his lightheadedness and weakness. He is taking Eliquis for the blood clot and Ciprofloxacin for his UTI. His appetite is not very good. I thought it would be good to send you an update.

## 2020-06-25 ENCOUNTER — Telehealth: Payer: Self-pay | Admitting: Pulmonary Disease

## 2020-06-25 DIAGNOSIS — J9601 Acute respiratory failure with hypoxia: Secondary | ICD-10-CM

## 2020-06-25 NOTE — Telephone Encounter (Signed)
Per Dr. Bari Mantis document on a previous encounter, he wanted the DME to evaluate the pt ASAP for oxygen therapy. We did not do any testing on the pt in office. A new order has been placed for DME to do evaluation.

## 2020-06-25 NOTE — Telephone Encounter (Signed)
Matthew Nash; Katherina Right, [place]; [place], Michela Pitcher!   I could not find the 6 min walk test O2 sats. Can you have those uploaded so we can attach to the order? We must have that to bill for the oxygen.   Thanks!

## 2020-06-26 ENCOUNTER — Telehealth: Payer: Self-pay | Admitting: Pulmonary Disease

## 2020-06-26 DIAGNOSIS — J9601 Acute respiratory failure with hypoxia: Secondary | ICD-10-CM

## 2020-06-26 NOTE — Telephone Encounter (Signed)
Called and spoke with pt to see if he had received the O2 and pt stated that he still has not received it.  PCCs, please advise.

## 2020-06-26 NOTE — Telephone Encounter (Signed)
ATC pt to schedule a qualifying walk (pt was seen 06/16/20 so no OV needed), line rang X2 then rang to fast busy signal.  Wcb.

## 2020-06-26 NOTE — Telephone Encounter (Signed)
Can we please follow up to make sure this patient has received oxygen by now ? His satn during OV did not meet criteria but since then, he has been desaturating at home , so DME  Can check him at home &  document low sat

## 2020-06-26 NOTE — Telephone Encounter (Signed)
[name] a vm to check on pt's O2 order.

## 2020-06-26 NOTE — Telephone Encounter (Signed)
Just spoke to [facility] & they can not qualify patient for O2.  It would be a conflict of interest for dme to qualify pt.

## 2020-06-29 NOTE — Telephone Encounter (Signed)
Called and spoke with Matthew Nash letting her know that we were going to order ONO for pt to have done and she verbalized understanding. Order has been placed. Nothing further needed.

## 2020-06-29 NOTE — Telephone Encounter (Signed)
Please order ONO ASAP

## 2020-06-29 NOTE — Telephone Encounter (Signed)
Called pt and spoke with his wife Matthew Nash letting her know that we would need to get pt in for a qualifying walk to see if we could get him qualified for O2. Matthew Nash stated that a walk was done at last OV but pt's sats would not drop below 90%.  Matthew Nash stated that she believes the time that pt will need the O2 is when he is sleeping as she states that he wakes up gasping for air and states he will check his O2 sats when he wakes up and it shows that he is in the 80s when he wakes up after sleeping.  Dr. Elsworth Soho, please advise if you are fine with [place] ordering an ONO to be performed on room air to see if we can get pt qualified for nocturnal O2 as this seems to be when pt is having the most trouble.

## 2020-07-01 DIAGNOSIS — J449 Chronic obstructive pulmonary disease, unspecified: Secondary | ICD-10-CM | POA: Diagnosis not present

## 2020-07-01 DIAGNOSIS — R0902 Hypoxemia: Secondary | ICD-10-CM | POA: Diagnosis not present

## 2020-07-02 ENCOUNTER — Encounter: Payer: Self-pay | Admitting: Family Medicine

## 2020-07-03 ENCOUNTER — Encounter: Payer: Self-pay | Admitting: Family Medicine

## 2020-07-03 ENCOUNTER — Telehealth: Payer: Self-pay | Admitting: Pulmonary Disease

## 2020-07-03 DIAGNOSIS — I248 Other forms of acute ischemic heart disease: Secondary | ICD-10-CM | POA: Diagnosis not present

## 2020-07-03 DIAGNOSIS — I498 Other specified cardiac arrhythmias: Secondary | ICD-10-CM | POA: Diagnosis not present

## 2020-07-03 DIAGNOSIS — I10 Essential (primary) hypertension: Secondary | ICD-10-CM | POA: Diagnosis not present

## 2020-07-03 DIAGNOSIS — Z20822 Contact with and (suspected) exposure to covid-19: Secondary | ICD-10-CM | POA: Diagnosis not present

## 2020-07-03 DIAGNOSIS — R918 Other nonspecific abnormal finding of lung field: Secondary | ICD-10-CM | POA: Diagnosis not present

## 2020-07-03 DIAGNOSIS — Y998 Other external cause status: Secondary | ICD-10-CM | POA: Diagnosis not present

## 2020-07-03 DIAGNOSIS — M542 Cervicalgia: Secondary | ICD-10-CM | POA: Diagnosis not present

## 2020-07-03 DIAGNOSIS — S0990XA Unspecified injury of head, initial encounter: Secondary | ICD-10-CM | POA: Diagnosis not present

## 2020-07-03 DIAGNOSIS — W19XXXA Unspecified fall, initial encounter: Secondary | ICD-10-CM | POA: Diagnosis not present

## 2020-07-03 DIAGNOSIS — W228XXA Striking against or struck by other objects, initial encounter: Secondary | ICD-10-CM | POA: Diagnosis not present

## 2020-07-03 DIAGNOSIS — Z86711 Personal history of pulmonary embolism: Secondary | ICD-10-CM | POA: Diagnosis not present

## 2020-07-03 DIAGNOSIS — E279 Disorder of adrenal gland, unspecified: Secondary | ICD-10-CM | POA: Diagnosis not present

## 2020-07-03 DIAGNOSIS — S299XXA Unspecified injury of thorax, initial encounter: Secondary | ICD-10-CM | POA: Diagnosis not present

## 2020-07-03 DIAGNOSIS — R0902 Hypoxemia: Secondary | ICD-10-CM | POA: Diagnosis not present

## 2020-07-03 DIAGNOSIS — Z7901 Long term (current) use of anticoagulants: Secondary | ICD-10-CM | POA: Diagnosis not present

## 2020-07-03 DIAGNOSIS — R2243 Localized swelling, mass and lump, lower limb, bilateral: Secondary | ICD-10-CM | POA: Diagnosis not present

## 2020-07-03 DIAGNOSIS — J9601 Acute respiratory failure with hypoxia: Secondary | ICD-10-CM | POA: Diagnosis not present

## 2020-07-03 DIAGNOSIS — G319 Degenerative disease of nervous system, unspecified: Secondary | ICD-10-CM | POA: Diagnosis not present

## 2020-07-03 DIAGNOSIS — J9811 Atelectasis: Secondary | ICD-10-CM | POA: Diagnosis not present

## 2020-07-03 DIAGNOSIS — R531 Weakness: Secondary | ICD-10-CM | POA: Diagnosis not present

## 2020-07-03 DIAGNOSIS — G4733 Obstructive sleep apnea (adult) (pediatric): Secondary | ICD-10-CM | POA: Diagnosis not present

## 2020-07-03 DIAGNOSIS — M954 Acquired deformity of chest and rib: Secondary | ICD-10-CM | POA: Diagnosis not present

## 2020-07-03 DIAGNOSIS — Z87891 Personal history of nicotine dependence: Secondary | ICD-10-CM | POA: Diagnosis not present

## 2020-07-03 NOTE — Telephone Encounter (Signed)
Spoke with the pt's wife and informed here that as of August 1st we will no longer be offering televisits. Pt's wife stated understanding. Nothing further needed at this time.

## 2020-07-04 DIAGNOSIS — G4733 Obstructive sleep apnea (adult) (pediatric): Secondary | ICD-10-CM | POA: Diagnosis present

## 2020-07-04 DIAGNOSIS — M4699 Unspecified inflammatory spondylopathy, multiple sites in spine: Secondary | ICD-10-CM | POA: Diagnosis present

## 2020-07-04 DIAGNOSIS — Z86711 Personal history of pulmonary embolism: Secondary | ICD-10-CM | POA: Diagnosis not present

## 2020-07-04 DIAGNOSIS — I361 Nonrheumatic tricuspid (valve) insufficiency: Secondary | ICD-10-CM | POA: Diagnosis not present

## 2020-07-04 DIAGNOSIS — E785 Hyperlipidemia, unspecified: Secondary | ICD-10-CM | POA: Diagnosis present

## 2020-07-04 DIAGNOSIS — M6259 Muscle wasting and atrophy, not elsewhere classified, multiple sites: Secondary | ICD-10-CM | POA: Diagnosis present

## 2020-07-04 DIAGNOSIS — J9811 Atelectasis: Secondary | ICD-10-CM | POA: Diagnosis present

## 2020-07-04 DIAGNOSIS — J90 Pleural effusion, not elsewhere classified: Secondary | ICD-10-CM | POA: Diagnosis not present

## 2020-07-04 DIAGNOSIS — E7800 Pure hypercholesterolemia, unspecified: Secondary | ICD-10-CM | POA: Diagnosis present

## 2020-07-04 DIAGNOSIS — M2550 Pain in unspecified joint: Secondary | ICD-10-CM | POA: Diagnosis not present

## 2020-07-04 DIAGNOSIS — Z7401 Bed confinement status: Secondary | ICD-10-CM | POA: Diagnosis not present

## 2020-07-04 DIAGNOSIS — Z720 Tobacco use: Secondary | ICD-10-CM | POA: Diagnosis not present

## 2020-07-04 DIAGNOSIS — E119 Type 2 diabetes mellitus without complications: Secondary | ICD-10-CM | POA: Diagnosis present

## 2020-07-04 DIAGNOSIS — R296 Repeated falls: Secondary | ICD-10-CM | POA: Diagnosis present

## 2020-07-04 DIAGNOSIS — E1140 Type 2 diabetes mellitus with diabetic neuropathy, unspecified: Secondary | ICD-10-CM | POA: Diagnosis present

## 2020-07-04 DIAGNOSIS — M4800 Spinal stenosis, site unspecified: Secondary | ICD-10-CM | POA: Diagnosis present

## 2020-07-04 DIAGNOSIS — R0902 Hypoxemia: Secondary | ICD-10-CM | POA: Diagnosis not present

## 2020-07-04 DIAGNOSIS — Z8249 Family history of ischemic heart disease and other diseases of the circulatory system: Secondary | ICD-10-CM | POA: Diagnosis not present

## 2020-07-04 DIAGNOSIS — J9601 Acute respiratory failure with hypoxia: Secondary | ICD-10-CM | POA: Diagnosis present

## 2020-07-04 DIAGNOSIS — Z7901 Long term (current) use of anticoagulants: Secondary | ICD-10-CM | POA: Diagnosis not present

## 2020-07-04 DIAGNOSIS — G629 Polyneuropathy, unspecified: Secondary | ICD-10-CM | POA: Diagnosis present

## 2020-07-04 DIAGNOSIS — Z20822 Contact with and (suspected) exposure to covid-19: Secondary | ICD-10-CM | POA: Diagnosis present

## 2020-07-04 DIAGNOSIS — I10 Essential (primary) hypertension: Secondary | ICD-10-CM | POA: Diagnosis present

## 2020-07-04 DIAGNOSIS — Z8614 Personal history of Methicillin resistant Staphylococcus aureus infection: Secondary | ICD-10-CM | POA: Diagnosis not present

## 2020-07-04 DIAGNOSIS — I248 Other forms of acute ischemic heart disease: Secondary | ICD-10-CM | POA: Diagnosis present

## 2020-07-04 DIAGNOSIS — E279 Disorder of adrenal gland, unspecified: Secondary | ICD-10-CM | POA: Diagnosis present

## 2020-07-04 DIAGNOSIS — S0990XA Unspecified injury of head, initial encounter: Secondary | ICD-10-CM | POA: Diagnosis present

## 2020-07-04 DIAGNOSIS — R531 Weakness: Secondary | ICD-10-CM | POA: Diagnosis not present

## 2020-07-04 DIAGNOSIS — I959 Hypotension, unspecified: Secondary | ICD-10-CM | POA: Diagnosis not present

## 2020-07-04 DIAGNOSIS — N319 Neuromuscular dysfunction of bladder, unspecified: Secondary | ICD-10-CM | POA: Diagnosis present

## 2020-07-06 DIAGNOSIS — N318 Other neuromuscular dysfunction of bladder: Secondary | ICD-10-CM | POA: Diagnosis not present

## 2020-07-06 DIAGNOSIS — E119 Type 2 diabetes mellitus without complications: Secondary | ICD-10-CM | POA: Diagnosis not present

## 2020-07-06 DIAGNOSIS — M6259 Muscle wasting and atrophy, not elsewhere classified, multiple sites: Secondary | ICD-10-CM | POA: Diagnosis present

## 2020-07-06 DIAGNOSIS — I2699 Other pulmonary embolism without acute cor pulmonale: Secondary | ICD-10-CM | POA: Diagnosis not present

## 2020-07-06 DIAGNOSIS — Z7901 Long term (current) use of anticoagulants: Secondary | ICD-10-CM | POA: Diagnosis not present

## 2020-07-06 DIAGNOSIS — J9601 Acute respiratory failure with hypoxia: Secondary | ICD-10-CM | POA: Diagnosis not present

## 2020-07-06 DIAGNOSIS — R5381 Other malaise: Secondary | ICD-10-CM | POA: Diagnosis not present

## 2020-07-06 DIAGNOSIS — R0902 Hypoxemia: Secondary | ICD-10-CM | POA: Diagnosis not present

## 2020-07-06 DIAGNOSIS — S91309A Unspecified open wound, unspecified foot, initial encounter: Secondary | ICD-10-CM | POA: Diagnosis not present

## 2020-07-06 DIAGNOSIS — I10 Essential (primary) hypertension: Secondary | ICD-10-CM | POA: Diagnosis not present

## 2020-07-06 DIAGNOSIS — G4733 Obstructive sleep apnea (adult) (pediatric): Secondary | ICD-10-CM | POA: Diagnosis not present

## 2020-07-06 DIAGNOSIS — R531 Weakness: Secondary | ICD-10-CM | POA: Diagnosis not present

## 2020-07-06 DIAGNOSIS — N281 Cyst of kidney, acquired: Secondary | ICD-10-CM | POA: Diagnosis not present

## 2020-07-06 DIAGNOSIS — Z7401 Bed confinement status: Secondary | ICD-10-CM | POA: Diagnosis not present

## 2020-07-06 DIAGNOSIS — R195 Other fecal abnormalities: Secondary | ICD-10-CM | POA: Diagnosis not present

## 2020-07-06 DIAGNOSIS — M2550 Pain in unspecified joint: Secondary | ICD-10-CM | POA: Diagnosis not present

## 2020-07-06 DIAGNOSIS — L89613 Pressure ulcer of right heel, stage 3: Secondary | ICD-10-CM | POA: Diagnosis not present

## 2020-07-06 DIAGNOSIS — J9811 Atelectasis: Secondary | ICD-10-CM | POA: Diagnosis not present

## 2020-07-06 DIAGNOSIS — E1140 Type 2 diabetes mellitus with diabetic neuropathy, unspecified: Secondary | ICD-10-CM | POA: Diagnosis present

## 2020-07-06 DIAGNOSIS — N319 Neuromuscular dysfunction of bladder, unspecified: Secondary | ICD-10-CM | POA: Diagnosis not present

## 2020-07-06 DIAGNOSIS — M4800 Spinal stenosis, site unspecified: Secondary | ICD-10-CM | POA: Diagnosis present

## 2020-07-06 DIAGNOSIS — E785 Hyperlipidemia, unspecified: Secondary | ICD-10-CM | POA: Diagnosis present

## 2020-07-06 DIAGNOSIS — Z87442 Personal history of urinary calculi: Secondary | ICD-10-CM | POA: Diagnosis not present

## 2020-07-06 DIAGNOSIS — I959 Hypotension, unspecified: Secondary | ICD-10-CM | POA: Diagnosis not present

## 2020-07-06 DIAGNOSIS — M4699 Unspecified inflammatory spondylopathy, multiple sites in spine: Secondary | ICD-10-CM | POA: Diagnosis present

## 2020-07-06 DIAGNOSIS — Z86711 Personal history of pulmonary embolism: Secondary | ICD-10-CM | POA: Diagnosis not present

## 2020-07-06 DIAGNOSIS — R296 Repeated falls: Secondary | ICD-10-CM | POA: Diagnosis present

## 2020-07-10 DIAGNOSIS — I10 Essential (primary) hypertension: Secondary | ICD-10-CM | POA: Diagnosis not present

## 2020-07-10 DIAGNOSIS — R5381 Other malaise: Secondary | ICD-10-CM | POA: Diagnosis not present

## 2020-07-10 DIAGNOSIS — J9601 Acute respiratory failure with hypoxia: Secondary | ICD-10-CM | POA: Diagnosis not present

## 2020-07-10 DIAGNOSIS — E119 Type 2 diabetes mellitus without complications: Secondary | ICD-10-CM | POA: Diagnosis not present

## 2020-07-13 ENCOUNTER — Encounter: Payer: Self-pay | Admitting: Family Medicine

## 2020-07-17 ENCOUNTER — Ambulatory Visit: Payer: Medicare Other | Admitting: Pulmonary Disease

## 2020-07-20 DIAGNOSIS — S91309A Unspecified open wound, unspecified foot, initial encounter: Secondary | ICD-10-CM | POA: Diagnosis not present

## 2020-07-20 DIAGNOSIS — G4733 Obstructive sleep apnea (adult) (pediatric): Secondary | ICD-10-CM | POA: Diagnosis not present

## 2020-07-20 DIAGNOSIS — I2699 Other pulmonary embolism without acute cor pulmonale: Secondary | ICD-10-CM | POA: Diagnosis not present

## 2020-07-22 DIAGNOSIS — L89613 Pressure ulcer of right heel, stage 3: Secondary | ICD-10-CM | POA: Diagnosis not present

## 2020-07-22 DIAGNOSIS — Z87442 Personal history of urinary calculi: Secondary | ICD-10-CM | POA: Diagnosis not present

## 2020-07-22 DIAGNOSIS — N319 Neuromuscular dysfunction of bladder, unspecified: Secondary | ICD-10-CM | POA: Diagnosis not present

## 2020-07-22 DIAGNOSIS — N281 Cyst of kidney, acquired: Secondary | ICD-10-CM | POA: Diagnosis not present

## 2020-07-24 DIAGNOSIS — N318 Other neuromuscular dysfunction of bladder: Secondary | ICD-10-CM | POA: Diagnosis not present

## 2020-07-24 DIAGNOSIS — I2699 Other pulmonary embolism without acute cor pulmonale: Secondary | ICD-10-CM | POA: Diagnosis not present

## 2020-07-24 DIAGNOSIS — R195 Other fecal abnormalities: Secondary | ICD-10-CM | POA: Diagnosis not present

## 2020-07-24 DIAGNOSIS — G4733 Obstructive sleep apnea (adult) (pediatric): Secondary | ICD-10-CM | POA: Diagnosis not present

## 2020-07-27 ENCOUNTER — Telehealth: Payer: Self-pay | Admitting: Pulmonary Disease

## 2020-07-27 ENCOUNTER — Telehealth: Payer: Self-pay | Admitting: Family Medicine

## 2020-07-27 NOTE — Telephone Encounter (Signed)
Patient passed away on 8/14, RN called with condolences, sympathy card placed in NP Mack's box to sign.

## 2020-07-27 NOTE — Telephone Encounter (Signed)
Matthew Nash, can you please mark patient as deceased?

## 2020-07-27 NOTE — Telephone Encounter (Signed)
Thank you will sign. Sorry to hear of this. Thank you for letting me participate in his care.   [name]

## 2020-07-27 NOTE — Telephone Encounter (Signed)
Called Matthew Nash- Raidyn had been at [facility], he kept getting diarrhea She visited him this part Friday and he did not look great The pt died the next day.    They were married for 42 years.  Matthew Nash misses him but knows he was suffering towards the end I have not received a death certificate, I let Sharyn Lull know if I do get a certificate from funeral home ([employer])  I will fill it out right away

## 2020-07-27 NOTE — Telephone Encounter (Signed)
Caller: Spouse Habib Kise)  Call Back # [phone]   Per spouse, patient died on 07/30/23.

## 2020-07-28 NOTE — Telephone Encounter (Signed)
Card signed. Handed to Lauren to place in Dr. Elsworth Soho box.   [name]

## 2020-08-12 DEATH — deceased
